# Patient Record
Sex: Female | Born: 1935 | Race: White | Hispanic: No | Smoking: Never smoker
Health system: Southern US, Community
[De-identification: ages and names within clinical notes are randomized; demographics above are authoritative.]

## PROBLEM LIST (undated history)

## (undated) DIAGNOSIS — IMO0001 Reserved for inherently not codable concepts without codable children: Secondary | ICD-10-CM

## (undated) DIAGNOSIS — I2699 Other pulmonary embolism without acute cor pulmonale: Secondary | ICD-10-CM

## (undated) DIAGNOSIS — R1310 Dysphagia, unspecified: Secondary | ICD-10-CM

## (undated) DIAGNOSIS — I2510 Atherosclerotic heart disease of native coronary artery without angina pectoris: Secondary | ICD-10-CM

## (undated) DIAGNOSIS — R413 Other amnesia: Secondary | ICD-10-CM

## (undated) DIAGNOSIS — M545 Low back pain, unspecified: Secondary | ICD-10-CM

## (undated) DIAGNOSIS — Z90710 Acquired absence of both cervix and uterus: Secondary | ICD-10-CM

## (undated) DIAGNOSIS — F0390 Unspecified dementia without behavioral disturbance: Secondary | ICD-10-CM

## (undated) DIAGNOSIS — G8929 Other chronic pain: Secondary | ICD-10-CM

## (undated) DIAGNOSIS — M25561 Pain in right knee: Principal | ICD-10-CM

## (undated) DIAGNOSIS — I428 Other cardiomyopathies: Secondary | ICD-10-CM

## (undated) DIAGNOSIS — I10 Essential (primary) hypertension: Secondary | ICD-10-CM

## (undated) DIAGNOSIS — M4800 Spinal stenosis, site unspecified: Secondary | ICD-10-CM

## (undated) DIAGNOSIS — L98499 Non-pressure chronic ulcer of skin of other sites with unspecified severity: Secondary | ICD-10-CM

## (undated) DIAGNOSIS — K5792 Diverticulitis of intestine, part unspecified, without perforation or abscess without bleeding: Secondary | ICD-10-CM

## (undated) DIAGNOSIS — S9031XA Contusion of right foot, initial encounter: Secondary | ICD-10-CM

## (undated) DIAGNOSIS — E785 Hyperlipidemia, unspecified: Secondary | ICD-10-CM

## (undated) DIAGNOSIS — R634 Abnormal weight loss: Secondary | ICD-10-CM

## (undated) HISTORY — DX: Acquired absence of both cervix and uterus: Z90.710

## (undated) HISTORY — DX: Pain in right knee: M25.561

## (undated) HISTORY — DX: Diverticulitis of intestine, part unspecified, without perforation or abscess without bleeding: K57.92

## (undated) HISTORY — DX: Low back pain, unspecified: M54.50

## (undated) HISTORY — DX: Non-pressure chronic ulcer of skin of other sites with unspecified severity: L98.499

## (undated) HISTORY — PX: CATARACT EXTRACTION: SUR2

## (undated) HISTORY — DX: Other chronic pain: G89.29

## (undated) HISTORY — PX: HEMICOLECTOMY: SHX854

## (undated) HISTORY — DX: Abnormal weight loss: R63.4

## (undated) HISTORY — DX: Essential (primary) hypertension: I10

## (undated) HISTORY — DX: Unspecified dementia without behavioral disturbance: F03.90

## (undated) HISTORY — DX: Dysphagia, unspecified: R13.10

## (undated) HISTORY — DX: Reserved for inherently not codable concepts without codable children: IMO0001

## (undated) HISTORY — DX: Atherosclerotic heart disease of native coronary artery without angina pectoris: I25.10

## (undated) HISTORY — DX: Low back pain: M54.5

## (undated) HISTORY — DX: Other pulmonary embolism without acute cor pulmonale: I26.99

## (undated) HISTORY — DX: Other amnesia: R41.3

## (undated) HISTORY — DX: Hyperlipidemia, unspecified: E78.5

## (undated) HISTORY — DX: Contusion of right foot, initial encounter: S90.31XA

## (undated) HISTORY — PX: BACK SURGERY: SHX140

## (undated) HISTORY — DX: Spinal stenosis, site unspecified: M48.00

## (undated) HISTORY — DX: Other cardiomyopathies: I42.8

---

## 1983-03-20 HISTORY — PX: OTHER SURGICAL HISTORY: SHX169

## 1989-03-19 HISTORY — PX: OTHER SURGICAL HISTORY: SHX169

## 1997-03-19 HISTORY — PX: OTHER SURGICAL HISTORY: SHX169

## 1997-12-15 ENCOUNTER — Encounter: Admission: RE | Admit: 1997-12-15 | Discharge: 1998-03-09 | Payer: Self-pay | Admitting: Anesthesiology

## 1998-07-20 ENCOUNTER — Ambulatory Visit (HOSPITAL_COMMUNITY): Admission: RE | Admit: 1998-07-20 | Discharge: 1998-07-20 | Payer: Self-pay | Admitting: Gastroenterology

## 1998-07-20 ENCOUNTER — Encounter: Payer: Self-pay | Admitting: Gastroenterology

## 1999-04-06 ENCOUNTER — Encounter: Payer: Self-pay | Admitting: Geriatric Medicine

## 1999-04-06 ENCOUNTER — Encounter: Admission: RE | Admit: 1999-04-06 | Discharge: 1999-04-06 | Payer: Self-pay | Admitting: Geriatric Medicine

## 1999-05-25 ENCOUNTER — Encounter: Payer: Self-pay | Admitting: Orthopedic Surgery

## 1999-06-01 ENCOUNTER — Inpatient Hospital Stay (HOSPITAL_COMMUNITY): Admission: RE | Admit: 1999-06-01 | Discharge: 1999-06-09 | Payer: Self-pay | Admitting: Orthopedic Surgery

## 1999-06-01 ENCOUNTER — Encounter: Payer: Self-pay | Admitting: Orthopedic Surgery

## 1999-06-02 ENCOUNTER — Encounter: Payer: Self-pay | Admitting: Orthopedic Surgery

## 1999-06-05 ENCOUNTER — Encounter: Payer: Self-pay | Admitting: Internal Medicine

## 1999-06-07 ENCOUNTER — Encounter: Payer: Self-pay | Admitting: Internal Medicine

## 1999-06-08 ENCOUNTER — Encounter: Payer: Self-pay | Admitting: Internal Medicine

## 1999-06-09 ENCOUNTER — Encounter: Payer: Self-pay | Admitting: Orthopedic Surgery

## 1999-06-21 ENCOUNTER — Ambulatory Visit (HOSPITAL_COMMUNITY): Admission: RE | Admit: 1999-06-21 | Discharge: 1999-06-21 | Payer: Self-pay | Admitting: Geriatric Medicine

## 1999-10-30 ENCOUNTER — Encounter: Payer: Self-pay | Admitting: Geriatric Medicine

## 1999-10-30 ENCOUNTER — Encounter: Admission: RE | Admit: 1999-10-30 | Discharge: 1999-10-30 | Payer: Self-pay | Admitting: Geriatric Medicine

## 2000-11-22 ENCOUNTER — Encounter: Payer: Self-pay | Admitting: Orthopedic Surgery

## 2000-11-22 ENCOUNTER — Encounter: Admission: RE | Admit: 2000-11-22 | Discharge: 2000-11-22 | Payer: Self-pay | Admitting: Orthopedic Surgery

## 2000-12-03 ENCOUNTER — Encounter: Payer: Self-pay | Admitting: Anesthesiology

## 2000-12-04 ENCOUNTER — Observation Stay (HOSPITAL_COMMUNITY): Admission: RE | Admit: 2000-12-04 | Discharge: 2000-12-05 | Payer: Self-pay | Admitting: Orthopedic Surgery

## 2003-01-17 ENCOUNTER — Inpatient Hospital Stay (HOSPITAL_COMMUNITY): Admission: EM | Admit: 2003-01-17 | Discharge: 2003-01-20 | Payer: Self-pay | Admitting: *Deleted

## 2003-01-18 HISTORY — PX: CORONARY ANGIOPLASTY WITH STENT PLACEMENT: SHX49

## 2003-03-20 HISTORY — PX: COLON RESECTION: SHX5231

## 2003-04-05 ENCOUNTER — Ambulatory Visit (HOSPITAL_COMMUNITY): Admission: RE | Admit: 2003-04-05 | Discharge: 2003-04-05 | Payer: Self-pay | Admitting: Orthopedic Surgery

## 2003-04-12 ENCOUNTER — Encounter: Admission: RE | Admit: 2003-04-12 | Discharge: 2003-04-12 | Payer: Self-pay | Admitting: Orthopedic Surgery

## 2003-04-26 ENCOUNTER — Encounter: Admission: RE | Admit: 2003-04-26 | Discharge: 2003-04-26 | Payer: Self-pay | Admitting: Neurosurgery

## 2003-05-10 ENCOUNTER — Encounter: Admission: RE | Admit: 2003-05-10 | Discharge: 2003-05-10 | Payer: Self-pay | Admitting: Orthopedic Surgery

## 2003-06-22 ENCOUNTER — Encounter: Admission: RE | Admit: 2003-06-22 | Discharge: 2003-06-22 | Payer: Self-pay | Admitting: Neurosurgery

## 2003-07-16 ENCOUNTER — Inpatient Hospital Stay (HOSPITAL_COMMUNITY): Admission: RE | Admit: 2003-07-16 | Discharge: 2003-07-18 | Payer: Self-pay | Admitting: Neurosurgery

## 2003-08-06 ENCOUNTER — Encounter: Admission: RE | Admit: 2003-08-06 | Discharge: 2003-08-06 | Payer: Self-pay | Admitting: Neurosurgery

## 2003-09-15 ENCOUNTER — Encounter: Admission: RE | Admit: 2003-09-15 | Discharge: 2003-09-15 | Payer: Self-pay | Admitting: Neurosurgery

## 2003-09-27 ENCOUNTER — Encounter: Admission: RE | Admit: 2003-09-27 | Discharge: 2003-09-27 | Payer: Self-pay | Admitting: Neurosurgery

## 2003-10-20 ENCOUNTER — Encounter: Admission: RE | Admit: 2003-10-20 | Discharge: 2003-10-20 | Payer: Self-pay | Admitting: Neurosurgery

## 2003-11-04 ENCOUNTER — Encounter: Admission: RE | Admit: 2003-11-04 | Discharge: 2003-11-04 | Payer: Self-pay | Admitting: Neurosurgery

## 2003-11-15 ENCOUNTER — Emergency Department (HOSPITAL_COMMUNITY): Admission: EM | Admit: 2003-11-15 | Discharge: 2003-11-16 | Payer: Self-pay | Admitting: Emergency Medicine

## 2003-11-16 ENCOUNTER — Inpatient Hospital Stay (HOSPITAL_COMMUNITY): Admission: EM | Admit: 2003-11-16 | Discharge: 2003-12-04 | Payer: Self-pay | Admitting: Emergency Medicine

## 2004-01-04 ENCOUNTER — Encounter: Admission: RE | Admit: 2004-01-04 | Discharge: 2004-01-04 | Payer: Self-pay | Admitting: General Surgery

## 2004-01-10 ENCOUNTER — Encounter: Admission: RE | Admit: 2004-01-10 | Discharge: 2004-01-19 | Payer: Self-pay | Admitting: General Surgery

## 2004-01-14 ENCOUNTER — Encounter (INDEPENDENT_AMBULATORY_CARE_PROVIDER_SITE_OTHER): Payer: Self-pay | Admitting: *Deleted

## 2004-01-14 ENCOUNTER — Inpatient Hospital Stay (HOSPITAL_COMMUNITY): Admission: RE | Admit: 2004-01-14 | Discharge: 2004-01-19 | Payer: Self-pay | Admitting: General Surgery

## 2004-03-07 ENCOUNTER — Inpatient Hospital Stay (HOSPITAL_COMMUNITY): Admission: RE | Admit: 2004-03-07 | Discharge: 2004-03-10 | Payer: Self-pay | Admitting: Orthopedic Surgery

## 2004-03-08 ENCOUNTER — Ambulatory Visit: Payer: Self-pay | Admitting: Physical Medicine & Rehabilitation

## 2004-03-10 ENCOUNTER — Inpatient Hospital Stay
Admission: RE | Admit: 2004-03-10 | Discharge: 2004-03-18 | Payer: Self-pay | Admitting: Physical Medicine & Rehabilitation

## 2004-06-16 ENCOUNTER — Encounter: Admission: RE | Admit: 2004-06-16 | Discharge: 2004-06-16 | Payer: Self-pay | Admitting: Neurosurgery

## 2005-04-23 ENCOUNTER — Encounter: Admission: RE | Admit: 2005-04-23 | Discharge: 2005-04-23 | Payer: Self-pay | Admitting: Geriatric Medicine

## 2005-04-24 ENCOUNTER — Inpatient Hospital Stay (HOSPITAL_COMMUNITY): Admission: EM | Admit: 2005-04-24 | Discharge: 2005-04-27 | Payer: Self-pay | Admitting: Emergency Medicine

## 2005-08-18 ENCOUNTER — Emergency Department (HOSPITAL_COMMUNITY): Admission: EM | Admit: 2005-08-18 | Discharge: 2005-08-18 | Payer: Self-pay | Admitting: Emergency Medicine

## 2005-12-17 ENCOUNTER — Encounter: Admission: RE | Admit: 2005-12-17 | Discharge: 2005-12-17 | Payer: Self-pay | Admitting: Cardiology

## 2005-12-21 ENCOUNTER — Observation Stay (HOSPITAL_COMMUNITY): Admission: AD | Admit: 2005-12-21 | Discharge: 2005-12-22 | Payer: Self-pay | Admitting: Cardiology

## 2006-01-17 ENCOUNTER — Encounter (HOSPITAL_COMMUNITY): Admission: RE | Admit: 2006-01-17 | Discharge: 2006-04-17 | Payer: Self-pay | Admitting: Cardiology

## 2006-04-19 ENCOUNTER — Encounter (HOSPITAL_COMMUNITY): Admission: RE | Admit: 2006-04-19 | Discharge: 2006-07-03 | Payer: Self-pay | Admitting: Cardiology

## 2008-03-19 HISTORY — PX: APPENDECTOMY: SHX54

## 2008-04-15 ENCOUNTER — Observation Stay (HOSPITAL_COMMUNITY): Admission: EM | Admit: 2008-04-15 | Discharge: 2008-04-16 | Payer: Self-pay | Admitting: Emergency Medicine

## 2008-04-15 ENCOUNTER — Ambulatory Visit: Payer: Self-pay | Admitting: Diagnostic Radiology

## 2008-04-15 ENCOUNTER — Encounter (INDEPENDENT_AMBULATORY_CARE_PROVIDER_SITE_OTHER): Payer: Self-pay | Admitting: General Surgery

## 2008-04-15 ENCOUNTER — Encounter: Payer: Self-pay | Admitting: Emergency Medicine

## 2009-07-15 ENCOUNTER — Encounter: Admission: RE | Admit: 2009-07-15 | Discharge: 2009-07-15 | Payer: Self-pay | Admitting: Cardiology

## 2009-08-07 ENCOUNTER — Emergency Department (HOSPITAL_COMMUNITY): Admission: EM | Admit: 2009-08-07 | Discharge: 2009-08-08 | Payer: Self-pay | Admitting: Emergency Medicine

## 2009-12-11 ENCOUNTER — Inpatient Hospital Stay (HOSPITAL_COMMUNITY): Admission: EM | Admit: 2009-12-11 | Discharge: 2009-12-13 | Payer: Self-pay | Admitting: Emergency Medicine

## 2010-04-08 ENCOUNTER — Encounter: Payer: Self-pay | Admitting: General Surgery

## 2010-06-01 LAB — COMPREHENSIVE METABOLIC PANEL
Albumin: 3.2 g/dL — ABNORMAL LOW (ref 3.5–5.2)
BUN: 13 mg/dL (ref 6–23)
CO2: 27 mEq/L (ref 19–32)
Calcium: 8.6 mg/dL (ref 8.4–10.5)
Creatinine, Ser: 1 mg/dL (ref 0.4–1.2)
GFR calc Af Amer: >60 mL/min (ref >60)
Total Protein: 6 g/dL (ref 6.0–8.3)

## 2010-06-01 LAB — POCT CARDIAC MARKERS
CKMB, poc: <1 ng/mL — ABNORMAL LOW (ref 1.0–8.0)
CKMB, poc: <1 ng/mL — ABNORMAL LOW (ref 1.0–8.0)
Myoglobin, poc: 78.2 ng/mL (ref 12–200)
Troponin i, poc: <0.05 ng/mL (ref 0.00–0.09)

## 2010-06-01 LAB — CARDIAC PANEL(CRET KIN+CKTOT+MB+TROPI)
CK, MB: 1.2 ng/mL (ref 0.3–4.0)
CK, MB: 1.2 ng/mL (ref 0.3–4.0)
Relative Index: INVALID (ref 0.0–2.5)
Relative Index: INVALID (ref 0.0–2.5)
Troponin I: 0.03 ng/mL (ref 0.00–0.06)
Troponin I: 0.03 ng/mL (ref 0.00–0.06)

## 2010-06-01 LAB — CBC
HCT: 37.4 % (ref 36.0–46.0)
Hemoglobin: 12.2 g/dL (ref 12.0–15.0)
Hemoglobin: 12.7 g/dL (ref 12.0–15.0)
MCHC: 32.6 g/dL (ref 30.0–36.0)
MCV: 89.9 fL (ref 78.0–100.0)
Platelets: 163 10*3/uL (ref 150–400)
Platelets: 185 10*3/uL (ref 150–400)
RBC: 4.35 MIL/uL (ref 3.87–5.11)
RDW: 14.4 % (ref 11.5–15.5)
WBC: 7.5 10*3/uL (ref 4.0–10.5)

## 2010-06-01 LAB — DIFFERENTIAL
Eosinophils Relative: 2 % (ref 0–5)
Lymphs Abs: 2.3 10*3/uL (ref 0.7–4.0)
Monocytes Absolute: 0.9 10*3/uL (ref 0.1–1.0)
Neutrophils Relative %: 55 % (ref 43–77)

## 2010-06-01 LAB — CK TOTAL AND CKMB (NOT AT ARMC)
Relative Index: INVALID (ref 0.0–2.5)
Total CK: 50 U/L (ref 7–177)

## 2010-06-01 LAB — POCT I-STAT, CHEM 8
BUN: 14 mg/dL (ref 6–23)
HCT: 40 % (ref 36.0–46.0)

## 2010-06-01 LAB — LIPID PANEL: VLDL: 17 mg/dL (ref 0–40)

## 2010-07-03 LAB — URINALYSIS, ROUTINE W REFLEX MICROSCOPIC
Bilirubin Urine: NEGATIVE
Glucose, UA: NEGATIVE mg/dL
Hgb urine dipstick: NEGATIVE
Nitrite: NEGATIVE
Protein, ur: NEGATIVE mg/dL
Specific Gravity, Urine: 1.013 (ref 1.005–1.030)

## 2010-07-03 LAB — COMPREHENSIVE METABOLIC PANEL
AST: 27 U/L (ref 0–37)
Albumin: 4.1 g/dL (ref 3.5–5.2)
Alkaline Phosphatase: 70 U/L (ref 39–117)
BUN: 19 mg/dL (ref 6–23)
CO2: 30 mEq/L (ref 19–32)
Calcium: 9.5 mg/dL (ref 8.4–10.5)
Chloride: 100 mEq/L (ref 96–112)
Creatinine, Ser: 1 mg/dL (ref 0.4–1.2)
Glucose, Bld: 120 mg/dL — ABNORMAL HIGH (ref 70–99)
Sodium: 138 mEq/L (ref 135–145)
Total Bilirubin: 0.6 mg/dL (ref 0.3–1.2)

## 2010-07-03 LAB — DIFFERENTIAL
Eosinophils Relative: 1 % (ref 0–5)
Lymphocytes Relative: 10 % — ABNORMAL LOW (ref 12–46)
Lymphs Abs: 1.4 10*3/uL (ref 0.7–4.0)
Monocytes Absolute: 0.9 10*3/uL (ref 0.1–1.0)
Neutro Abs: 11.7 10*3/uL — ABNORMAL HIGH (ref 1.7–7.7)
Neutrophils Relative %: 82 % — ABNORMAL HIGH (ref 43–77)

## 2010-07-03 LAB — URINE MICROSCOPIC-ADD ON

## 2010-07-03 LAB — URINE CULTURE

## 2010-07-03 LAB — CBC
HCT: 38.3 % (ref 36.0–46.0)
MCV: 88.4 fL (ref 78.0–100.0)
WBC: 14.2 10*3/uL — ABNORMAL HIGH (ref 4.0–10.5)

## 2010-08-01 NOTE — Discharge Summary (Signed)
[name], [name] NO.:  [id number]   MEDICAL RECORD NO.:  [id number]          PATIENT TYPE:  INP   LOCATION:  5531                         FACILITY:  [facility]   PHYSICIAN:  Alfonse Ras, MD   DATE OF BIRTH:  Aug 20, 1920   DATE OF ADMISSION:  04/15/2008  DATE OF DISCHARGE:  04/16/2008                               DISCHARGE SUMMARY   ADMITTING PHYSICIAN:  Lennie Muckle, M.D.   DISCHARGING PHYSICIAN:  Alfonse Ras, M.D.   CHIEF COMPLAINT/REASON FOR ADMISSION:  [name] is an 77 year old  female patient, history of CAD, cardiomyopathy and implanted cardiac  defibrillator who presented with abdominal pain that had begun about 6  p.m. on April 14, 2008.  She felt like she needed to have a BM but  after having a bowel movement had no relief in her pain.  She continued  to have worsening pain, no vomiting, no nausea, no diarrhea.  She  presented for treatment at the emergency department where she was found  to have a low-grade leukocytosis of 14,200.  CT of the abdomen and  pelvis revealed a thickened appendix consistent with probable early  appendicitis.  On exam, the patient was tender in the right lower  quadrant, had a midline incisional scar without hernia.  She had low-  grade fever of 99.2, otherwise vital signs were stable.  Based on  clinical exam and diagnostic workup, Dr. Freida Busman felt the patient had  acute appendicitis.  Therefore she was admitted with the following  diagnosis:  Acute appendicitis.   HOSPITAL COURSE:  The patient was taken directly from the ER to the OR  where she underwent a laparoscopic appendectomy for removal of a  nonperforated appendicitis.  Empirically, she has been given Unasyn  prior to surgery and was continued for several doses postoperatively.  On postop day one, April 16, 2008, the patient had a low-grade fever  99.2 in the past 24 hours.  Otherwise vital signs were stable.  No  respiratory trouble.  Abdomen was  soft, tender as expected around  incision, incision and skin were approximated with Dermabond and trocar  sites are unremarkable.  The patient tolerating full liquid diet, was  given IV morphine during the night, still needs to take p.o. Darvocet,  but otherwise is deemed appropriate for discharge home.  The patient was  previously at independent living at [facility], but because of recent  surgery she will go up to a slightly higher level of care prior to  discharge.   FINAL DISCHARGE DIAGNOSES:  1. Abdominal pain and leukocytosis secondary to acute nonperforated      appendicitis.  2. Status post laparoscopic appendectomy April 15, 2008 by Dr. Bertram Savin.  3. History of coronary artery disease, currently stable.  4. History of cardiomyopathy with implantable defibrillator.  5. History of pulmonary embolus.  6. History of ulcer disease.  7. History of dyslipidemia.   DISCHARGE MEDICATIONS:  The patient will resume the following home  medications.  1. Vitamin D 1000 mg daily.  2. Detrol LA  4 mg daily.  3. Coreg CR 20 mg daily.  4. Miacalcin 200 international units nasal spray daily.  5. Cymbalta 30 mg daily.  6. Vytorin 80 mg daily.  7. Lisinopril 5 mg daily.  8. Torsemide 10 mg daily.  9. Centrum Silver 1 pill daily.  10.Aspirin 325 mg daily.  11.Citracal 6000 mg 2 tablets daily.  12.Prevacid 15 mg daily.   NEW MEDICATIONS INCLUDE:  1. Darvocet-N 100 half to 1 tablet every 6 hours as needed for pain.  2. Over-the-counter stool softener of choice daily for prevention of      constipation.   DIET:  Low sodium heart-healthy in the immediate postoperative period  while taking Darvocet, encouraged slightly more p.o. fluid intake to  minimize risk for constipation.   WOUND CARE:  Not applicable.   ACTIVITY:  May shower.  No lifting greater than 20 pounds for 5 weeks.   FOLLOWUP:  She is to follow up with the __________OB Clinic at [facility] on  Tuesday, February 16 at 2 p.m.  She needs to arrive  at 1:45 p.m. to complete any necessary paperwork.      Allison L. Rolene Course      Alfonse Ras, MD  Electronically Signed    ALE/MEDQ  D:  04/16/2008  T:  04/16/2008  Job:  [id number]   cc:   Lennie Muckle, MD

## 2010-08-01 NOTE — Op Note (Signed)
[name], [name]                ACCOUNT NO.:  [id number]   MEDICAL RECORD NO.:  [id number]          PATIENT TYPE:  INP   LOCATION:  5531                         FACILITY:  [facility]   PHYSICIAN:  Lennie Muckle, MD      DATE OF BIRTH:  07-10-1920   DATE OF PROCEDURE:  04/15/2008  DATE OF DISCHARGE:                               OPERATIVE REPORT   PREOPERATIVE DIAGNOSIS:  Acute appendicitis.   POSTOPERATIVE DIAGNOSIS:  Acute appendicitis.   PROCEDURE:  Laparoscopic appendectomy.   SURGEON:  Lennie Muckle, MD   ASSISTANT:  None.   FINDINGS:  Inflamed thickened appendix, no purulent fluids.   SPECIMENS:  Appendix to Pathology.   ESTIMATED BLOOD LOSS:  Minimal amount of blood loss.   COMPLICATIONS:  No immediate complications.   DRAINS:  No drains are placed.   INDICATIONS FOR PROCEDURE:  Ms. Punches is an 77 year old female who had  abdominal pain starting yesterday.  She was seen in the emergency  department had an elevated white count and thickened appendix on CT  scan.  Examination was consistent with acute appendicitis.  Informed  consent was obtained.   DETAILS OF PROCEDURE:  Ms. Windt was identified in the preoperative  holding area.  All questions were answered.  She was taken to the  operating room.  Once in the operating room, placed in the supine  position.  Her abdomen was prepped and draped in usual sterile fashion.  Time-out procedure indicating the patient and the procedure was  performed.  I placed an incision in the right upper quadrant using the  #11 blade and placed a 5-mm trocar into the abdominal cavity, while  using the Optiview.  All layers of abdominal wall were visualized upon  entry.  I inspected the abdomen after obtaining adequate  pneumoinsufflation.  Colon was visualized in the right side of the  abdomen.  There was no evidence of injury upon placement of the trocar.  I then inspected the abdominal cavity.  There were adhesions of omentum  to  the midline.  Using the camera, I dissected some of these adhesions,  I was able to place a 5-mm trocar just to the right of the adhesions.  Using the trocar as well as a blunt dissector, I removed some of the  adhesions surrounding the trocar.  There seemed to be no other  adhesions.  I then placed a 5-mm trocar in the left lower quadrant under  visualization with a camera.  The patient was then placed in  Trendelenburg right side up.  I identified the cecum.  The appendix was  coursing near the pelvis.  This was inflamed and thickened.  I grasped  the appendix with a Glassman.  I then dissected some of the lateral  attachments with the Marilyn forceps.  I was able to pull the appendix  up and dissect around the base of the [facility] forceps.  I then up sized  the left lower quadrant port to 12-mm port.  Using laparoscopic GIA  stapler, I stapled across the base to the appendix with the blue  load.  Two loads were used.  I then stapled across the mesoappendix while  holding the appendix up to the abdominal wall.  Appendix was then placed  in the EndoCatch bag and removed from the abdomen.  I inspected the  staple and appeared intact.  There was no evidence of bleeding, appeared  to be no purulent fluid within the pelvis.  I did irrigate with 1 L of  saline.  Final inspection of staple line revealed no bleeding.  Staple  line was intact.  I then closed the facial defect at the left lower  quadrant after removing the 12-mm port.  A 0-Vicryl suture was placed  for closure.  The abdomen was inspected one more time.  There was no  evidence of bleeding or injury.  Trocars were removed.  Pneumoperitoneum  was released.  Skin was closed with 4-0 Monocryl.  Dermabond was placed  for final dressing.  The patient was extubated and then transported to  post anesthesia care unit in stable condition.  She will be monitored in  telemetry bed tonight, receive 1 dose of Unasyn postoperatively.  I will  start  her on Darvocet for pain.  She may be able to be discharged home  tomorrow faring her examination and comorbid condition such as heart and  lungs.      Lennie Muckle, MD  Electronically Signed     ALA/MEDQ  D:  04/15/2008  T:  04/16/2008  Job:  [id number]   cc:   Cherylynn Ridges, M.D.  Hal T. Stoneking, M.D.

## 2010-08-01 NOTE — H&P (Signed)
NAMENAVIKA, HOOPES                ACCOUNT NO.:  [id number]   MEDICAL RECORD NO.:  [id number]          PATIENT TYPE:  INP   LOCATION:  5531                         FACILITY:  [facility]   PHYSICIAN:  Lennie Muckle, MD      DATE OF BIRTH:  1920-07-07   DATE OF ADMISSION:  04/15/2008  DATE OF DISCHARGE:                              HISTORY & PHYSICAL   Appendicitis.   HISTORY OF PRESENT ILLNESS:  Ms. Tocci is an 77 year old female who  began having abdominal pain approximately 6 o'clock in the evening  around April 14, 2008.  She thought this was related to having had a  bowel movement.  She had tried several factors to relieve her pain.  She  did have 2 bowel movements, but continued to have abdominal pain.  Most  of it was located in the right lower quadrant.  This was worse with  movement.  No nausea, vomiting, and no diarrhea.  She went to the  emergency department and the CT scan revealed a thickened appendix.  She  did have a white count elevated at 14.2.   PAST MEDICAL HISTORY:  1. Coronary artery disease.  2. History of PE.  3. Ulcer disease.  4. She has an AICD.  5. History of cardiomyopathy.   SURGICAL HISTORY:  Colon resection in 2005, history of diverticulitis,  AICD placement, she had kidney replacement, two shoulder surgeries and  back surgery.   MEDICATIONS:  Aspirin, Vytorin, Detrol, lisinopril, Prevacid, Coreg, and  Cymbalta.   ALLERGIES:  IBUPROFEN causes nausea.   REVIEW OF SYSTEMS:  She is essentially negative.  __________.   PHYSICAL EXAMINATION:  GENERAL:  She is lying on a stretcher.  She is  pleasant, is in no acute distress.  VITAL SIGNS:  Temperature is 99.2, blood pressure 130/70, pulse 80, and  respiratory rate 20.  HEENT:  Extraocular muscles are intact.  Head is normocephalic.  CHEST:  Clear to auscultation bilaterally.  CARDIOVASCULAR:  Regular rate and rhythm.  ABDOMEN:  Midline incisional scar.  No hernias are appreciable.  She  does have  some tenderness in the right lower quadrant.  No peritoneal  signs.  EXTREMITIES:  No deformities or edema are seen.  SKIN:  She has very thin skin, ecchymosis on the left arm and bilateral  lower extremity.   LABORATORY DATA:  CT scan, thickened appendix, no fluid is noted.  Other  labs are unremarkable.   ASSESSMENT AND PLAN:  Acute appendicitis.  I have discussed with Ms.  Bergstresser and her family that she should benefit from an appendectomy.  I  will attempt laparoscopic appendectomy.  She has had previous colon  surgery at the midline incision.  If she has too much scar tissue may  have to convert to an open procedure.  I did talk  about possible postoperative abscess, bleeding, and infection.  She  understands the risks.  We will go ahead and proceed to the operating  room.  She was dosed with Unasyn preoperatively in the emergency  department, and we will dose postoperatively.  All questions were  answered and informed consent was obtained.      Lennie Muckle, MD  Electronically Signed     ALA/MEDQ  D:  04/15/2008  T:  04/15/2008  Job:  [id number]

## 2010-08-04 NOTE — Discharge Summary (Signed)
NAME:  [name], [name]                          ACCOUNT NO.:  [id number]   MEDICAL RECORD NO.:  [id number]                   PATIENT TYPE:  INP   LOCATION:  6525                                 FACILITY:  [facility]   PHYSICIAN:  Corinna L. Lendell Caprice, MD             DATE OF BIRTH:  [birth year]   DATE OF ADMISSION:  01/17/2003  DATE OF DISCHARGE:  01/20/2003                                 DISCHARGE SUMMARY   PRIMARY CARE PHYSICIAN:  Dr. Merlene Laughter.   CARDIOLOGIST:  Dr. [name].   DISCHARGE DIAGNOSES:  1. Atypical chest pain secondary to coronary artery disease, resolved.  2. Coronary artery disease, status post stent placement, stable.  3. Dyslipidemia, stable.  4. Hypertension, stable.  5. Cardiomyopathy, stable on medications.  6. Gastroesophageal reflux disease, stable.  7. Pseudogout, stable.  8. Osteoarthritis, stable.   DISCHARGE MEDICATIONS:  1. Prednisone 10 mg twice a day.  2. Prevacid 30 mg a day.  3. Aspirin 325 mg a day.  4. Plavix 75 mg a day.  5. Toprol XL 50 mg a day.  6. Lipitor 20 mg a day.   ALLERGIES:  No known drug allergies.   PROCEDURES:  The patient underwent cardiac catheterization on January 19, 2003, by Dr. [name].  She was found to have 95% blockage of the  second obtuse marginal circumflex which was stented.  The patient had  borderline disease with marked calcification in the proximal LAD with  borderline critical disease in the mid LAD, dominant large OM1, and the RCA  was found to be non-dominant.  The patient tolerated the procedure well.  There were no complications.   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  She has been tapered on prednisone  down to 5 mg just starting yesterday.  This morning at 4:30 she was  awakened  with a pressure type pain in her chest, pleuritic in nature.  The patient's  pain was relieved slightly in the ER with morphine, but never totally gone  away.  She did develop some nausea after morphine.  She has had no fever and  no cough.  She had a sense of shortness of breath, but primarily because she  has had trouble taking a deep breath due to discomfort in the left side of  her chest.  She has no leg swelling or tenderness.  She is admitted to rule  out MI.   HOSPITAL COURSE:  The patient is admitted to telemetry unit.  A 12-lead EKG  reveals sinus tachycardia with questionable T-wave changes in the lateral  leads.  Her cardiac rhythm remains stable on telemetry.  Cardiac enzymes  were done in a series and found  to be negative.  The patient underwent a  stress Cardiolite which revealed no indication of ischemia;  however, the  stress test did indicate significant LV dysfunction.  The patient therefore  underwent cardiac catheterization as noted above on January 19, 2003.  She  was found to have critical disease in her OM2 which was stented.  She  tolerated the procedure without difficulty, and has not had any further  chest pain or pressure or shortness of breath.  The patient also underwent a  2-D echocardiogram, the results of which are pending at the time of  discharge, but Dr. Fraser Din will be following those up.   The patient has a history of hypertension.  Her medication regimen at time  of admission was slightly altered by the cardiologist to better suit her  cardiomyopathy, including beta blockers and ACE inhibitors.  She will be  following up with Dr. Fraser Din regarding her CAD and hypertension.   The patient did not have any difficulty from her pseudogout during this  hospitalization nor from her GERD.  She was kept on stress ulcer  prophylaxis.  Of note, the patient's prednisone was increased to 10 mg twice  a day during this hospitalization.  She is to  continue this dosage until she  follows up with Dr. Pete Glatter.  Her current appointment with Dr. Pete Glatter  is January 20, 2003, at 3:30 p.m.   At time of discharge, the patient is free of any signs or symptoms of stress  with no chest pain, shortness of breath, palpitations, nausea.  Blood  pressure is 110/60, heart rate is in the 80s.   DISCHARGE LABORATORY DATA:  White blood cell count 8.5, hemoglobin 11.2,  hematocrit 32.9, platelet count 306,000.  Sodium 141, potassium 3.9, glucose  123, BUN 17, creatinine 1.1, total bilirubin 0.5, ALP 61, AST 20, ALT 13.   CONSULTATIONS:  Dr. [name] of [facility].   CONDITION ON DISCHARGE:  Good.   DISPOSITION:  Discharged to home.    FOLLOWUP:  The patient is scheduled with followup with Dr. Pete Glatter on  Wednesday, January 20, 2003, at 3:30 p.m., and with Dr. Fraser Din on February 05, 2003, at 9:45 a.m.      Ellender Hose. Earlene Plater, N.P.                    Corinna L. Lendell Caprice, MD    SMD/MEDQ  D:  01/20/2003  T:  01/21/2003  Job:  [id number]   cc:   [name], M.D.  [address]  [place]  [place] [address]  Fax: [phone]   Hal T. Stoneking, M.D.  [address] [place], [place] [address]  Fax: [phone]

## 2010-08-04 NOTE — Discharge Summary (Signed)
[name], [name]                ACCOUNT NO.:  [id number]   MEDICAL RECORD NO.:  [id number]          PATIENT TYPE:  INP   LOCATION:  5028                         FACILITY:  [facility]   PHYSICIAN:  Rodney A. Mortenson, M.D.DATE OF BIRTH:  17-Jul-1920   DATE OF ADMISSION:  03/07/2004  DATE OF DISCHARGE:  03/10/2004                                 DISCHARGE SUMMARY   ADMISSION DIAGNOSES:  1.  Painful left total knee arthroplasty.  2.  Hypertension.  3.  Gastroesophageal reflux disease.  4.  Hiatal hernia.  5.  Coronary heart disease, with stent.  6.  History of pulmonary emboli.  7.  Diverticulosis.  8.  Osteoporosis.  9.  Sleep apnea.  10. Pseudogout.  11. Degenerative disc disease and spinal stenosis of the lumbar spine.  12. History of hepatitis A in 1980.   DISCHARGE DIAGNOSES:  1.  Revision left total knee arthroplasty with poly exchange.  2.  Postoperative blood loss anemia.  3.  History of hypertension.  4.  History of gastroesophageal reflux disease.  5.  History of hiatal hernia.  6.  History of coronary artery disease, with stent.  7.  History of pulmonary emboli.  8.  History of diverticulosis.  9.  History of osteoporosis.  10. History of sleep apnea.  11. History of pseudogout.  12. History of degenerative disc disease, with spinal stenosis.  13. History of hepatitis A.   HISTORY OF PRESENT ILLNESS:  The patient is an 77 year old female with a  history of right total knee arthroplasty in 1985 with a right total knee  arthroplasty in 1987 and a left total knee arthroplasty in 1998.  The  patient presents with a one-year history of progressively worsening left  knee.  It is a constant sharp diffuse pain about the knee, with radiation up  into the hip.  It increases with walking.  It improves with heat and  Vicodin.  She does have stiffness, edema, and using a walker for the last  three months.   ALLERGIES:  Motrin.   CURRENT MEDICATIONS:  1.  Vytorin 10/40 mg  p.o. daily.  2.  Coumadin 2 mg p.o. every other day alternating with 3 mg p.o. every      other day.  3.  Toprol XL 100 mg p.o. daily.  4.  Vicodin ES p.r.n.  5.  Prevacid 50 mg p.o. daily.  6.  Colace 100 mg p.o. daily p.r.n.  7.  Miacalcin spray daily.   SURGICAL PROCEDURE:  On March 07, 2004, the patient was taken to the O.R.  by Rinaldo Ratel, M.D., assisted by Norlene Campbell, M.D.  The patient  underwent a revision of her left total knee arthroplasty with synovectomy  and tibial bearing insert exchange.  The patient tolerated the procedure  well.  There were no complications.  One Hemovac drain was left in place.  The patient was discharged to the recovery room and then to the orthopedics  floor in good condition, returning to her Coumadin for DVT prophylaxis, IV  antibiotics, and IV pain medicines.   CONSULTS:  The following  routine consults were requested:  Physical therapy,  occupational therapy, and rehab.   HOSPITAL COURSE:  On March 07, 2004, the patient was admitted to [facility] under the care of Dr. Rinaldo Ratel.  The patient was taken  to the O.R., where revision of her left total knee arthroplasty was  performed without any complications.  The patient tolerated the procedure  well and was transferred to the recovery room and then to the orthopedic  floor in good condition on IV pain medications, antibiotics, and restarted  Coumadin for DVT prophylaxis.   The patient then incurred a total of three days postoperative care on the  orthopedic floor in which the patient did develop some postoperative blood  loss anemia for which the patient felt symptomatic.  Hemoglobin 9.4, so she  was typed and crossed and transfused 2 units of packed red blood cells  without any complications, with expected improvement of her hemoglobin to  11.6 and improvement in her blood pressure and heart rate.  The patient felt  less lethargic after getting the  transfusion.  The patient's vital signs  otherwise remained stable, except for occasional low-grade temperatures  around the time of her transfusion.  Her wound remained benign for any signs  of infection.  Her leg remained neuromotor vascularly intact.  The patient  worked well with physical therapy, but it was felt that due to her living  alone at home and other social issues she would benefit from rehab, so she  was evaluated and transferred to that unit on postop day three in good  condition.   LABORATORIES:  Chest x-ray on admission showed no acute abnormalities, upper  limit heart size, and a large hiatal hernia.  CBC on March 10, 2004:  WBCs 8.1, down from a high of 12.3.  Hemoglobin 11.7, up from 9.4 postop day  two.  Hematocrit 35.4, platelets 189.  INR on March 10, 2004 was 1.4 on  Coumadin.  Routine chemistries on March 09, 2004:  Sodium 136, potassium  3.6, glucose 104, BUN 10, creatinine 0.8.  Iron studies on March 08, 2004:  Iron 46, TIBC 256, percent saturation 18.  Urinalysis on admission  was positive for moderate leukocyte esterase, 3-6 WBCs, no bacteria noted.  The patient was treated with routine postoperative antibiotics.   The patient received a total of 2 units of packed red blood cells during her  hospitalization without complications.   MEDICATIONS ON DISCHARGE FROM ORTHOPEDICS FLOOR:  1.  Colace 100 mg p.o. b.i.d.  2.  Trinsicon 1 tablet p.o. t.i.d.  3.  Lovenox bridging at 30 mg subcutaneously q.12 hours until therapeutic.  4.  Robaxin 500 mg p.o. q.6 h. p.r.n.  5.  Tylenol 650 mg p.o. q.4 h. p.r.n.  6.  Percocet 1 or 2 tablets q.4-6 h. p.r.n.  7.  Toprol XL 100 mg p.o. daily.  8.  Prevacid 15 mg p.o. daily.  9.  Miacalcin spray 1 puff daily.  10. Zetia 10 mg p.o. daily.  11. Zocor 40 mg p.o. daily.  12. Coumadin 4 mg p.o. daily.   DISCHARGE INSTRUCTIONS:  To rehab unit.  MEDICATIONS:  The patient is to continue medications as dispensed  on  orthopedics floor.  Once the patient becomes therapeutic with INR, she may  discontinue Lovenox.   ACTIVITY:  The patient may be weightbearing as tolerated with the use of a  walker and physical therapy.   WOUND CARE:  The patient should have wound  checked daily for any signs of  infection.  Staples to be removed on postop day 14 if still in hospital.   DIET:  No restrictions.   FOLLOWUP:  The patient needs a follow-up appointment with Dr. Chaney Malling in  his office one week from discharge from rehab services.   CONDITION UPON DISCHARGE TO REHAB UNIT:  Improved and good.      RWK/MEDQ  D:  05/23/2004  T:  05/23/2004  Job:  [id number]

## 2010-08-04 NOTE — Op Note (Signed)
[name]  Patient:    Christine, Henry Visit Number: [id number] MRN: [id number]          Service Type: SUR Location: 4W [id number] [id number] Attending Physician:  Loanne Drilling Proc. Date: 12/04/00 Admit Date:  12/04/2000                             Operative Report  PREOPERATIVE DIAGNOSES:  Right shoulder impingement, acromioclavicular arthrosis, rotator cuff tear.  POSTOPERATIVE DIAGNOSES:  Right shoulder impingement, acromioclavicular arthrosis, rotator cuff tear.  PROCEDURE:  Right shoulder subacromial decompression, distal clavicle resection, and debridement of massive irreparable rotator cuff tear.  SURGEON:  Ollen Gross, M.D.  ASSISTANT:  Irena Cords, P.A.-C.  ANESTHESIA:  Intrascalene and general.  ESTIMATED BLOOD LOSS:  Minimal.  DRAINS:  None.  COMPLICATIONS:  None.  CONDITION:  Stable to recovery.  BRIEF CLINICAL NOTE:  [name] is a 77 year old female, who had an injury several months ago with progressively worsening right shoulder pain.  The initial exam was consistent with possible rotator cuff tear.  I initially saw her about two weeks ago.  We got an MRI scan confirming tear with retraction. She presents now for attempted repair and decompression/distal clavicle resection.  DESCRIPTION OF PROCEDURE:  After the successful administration of intrascalene block, then general anesthetic, the patient is placed sitting upright in the beach chair position, and her right upper extremity and shoulder girdle isolated from her trunk with plastic drapes and prepped and draped in the usual sterile fashion.  The shoulder landmarks are marked, and the incision is made along the skin lines from mid acromion anteriorly.  Skin was cut with a 10 blade, subcutaneous tissue to the deltoid fascia.  The subcu flaps are elevated circumferentially.  The distal clavicle is palpated and the periosteum was split longitudinally.  About 2 cm medial  to this, the clavicle and its line crosses all the way across the anterior aspect of the acromion, and the deltoid is split for about 2 cm from the lateral right tip of the acromion.  The entire anterior soft tissue flap is subperiosteally elevated. Posteriorly we just elevated up the distal clavicle.  Retractors are placed and the distal clavicle resection made with oscillating saw, removing about 1 cm of the bone.  The subacromial decompression is performed utilizing an oscillating saw to creatinine a flat undersurface of the acromion.  She had a mild type 2 curve, and this converted her to a flat undersurface.  The cup is now inspected, and there is a fair amount of fibrous bursal tissue which is excised.  I was only able to find the subscapularis which was partially torn, and the free edge of that is repaired back to the biceps which was completely intact.  The anchor for the subscapularis was fine.  There was no evidence whatsoever of remnants of supraspinatus inferior space of teres minor.  The tissue that was medial to the glenoid was debrided back to a more stable edge so as to prevent any catching in the joint.  I put her through a range of motion, and there was no popping or catching.  The wound was then copiously irrigated with antibiotic solution and then the deltoid reattached to the acromion through drill holes with #1 Ethibond.  The shoulder was again placed through a range of motion, and this repair held without problems.  The fascia over the distal clavicle was then embrocated with the  Ethibond and the deltoid split closed with interrupted 2-0 Vicryl.  Subcu is closed with interrupted 2-0 Vicryl, subcuticular with running 4-0 Monocryl.  Incisions were cleaned and dried and Steri-Strips and a bulky sterile dressing applied.  She was placed into a sling, awakened, and transported to recovery in stable condition. Attending Physician:  Loanne Drilling DD:  12/04/00 TD:   12/04/00 Job: [id number]

## 2010-08-04 NOTE — Cardiovascular Report (Signed)
[name], DANTE                          ACCOUNT NO.:  [id number]   MEDICAL RECORD NO.:  [id number]                   PATIENT TYPE:  INP   LOCATION:  5527                                 FACILITY:  [facility]   PHYSICIAN:  [name], M.D.                 DATE OF BIRTH:  Oct 31, 1920   DATE OF PROCEDURE:  01/19/2003  DATE OF DISCHARGE:                              CARDIAC CATHETERIZATION   INDICATION FOR PROCEDURE:  Cardiomyopathy, ejection fraction 35% with  ongoing fatigue.   [name] is a very pleasant 77 year old female who as admitted to the hospital  for atypical chest pain.  She subsequently underwent a stress Cardiolite.  There was no inducible ischemia by Cardiolite.  However, the patient was  noted to have diffuse hypokinesis with ejection fraction of 35%.  Based on  these findings, it was elected to bring the patient for cardiac  catheterization.   PROCEDURE:  After obtaining written informed consent, the patient was  brought to the cardiac catheterization lab in a postabsorptive state.  Preop  sedation was achieved using IV Versed.  The right groin was prepped and  draped in the usual sterile fashion.  Local anesthesia was achieved using 1%  Xylocaine.  A 6 French hemostasis sheath was placed into the right femoral  artery using modified Seldinger technique.  Selective coronary angiography  was performed using a JL-4, JR-4 Judkins catheter.  Multiple views were  obtained.  All catheter exchanges were made over guide wire.  The films were  reviewed with Dr. Verdis Prime.  It was felt that the obtuse marginal could  be intervened on.  However, he felt that the left anterior descending could  possibly be critical as well.  He suggested an additional opinion from Dr.  Corliss Marcus.  This currently is being obtained.  The patient was  transferred to the holding area and hemostasis was sewn into place until  further decisions could be made.   FINDINGS:  1. Aortic pressure  was 137/80.  2. LV pressure 137/14.  3. Single plane ventriculogram revealed normal wall motion.  Ejection     fraction 65%.  Fluoroscopy revealed marked calcification of the proximal     LAD.   CORONARY ANGIOGRAPHY:  1. The left main coronary artery bifurcates into the left anterior     descending and circumflex vessel.  There is no disease in the left main     coronary artery.  2. LAD:  Left anterior descending is a moderate size vessel that gives rise     to a moderate size D1, small D2 and goes on in at the apex.  There is     borderline disease in the mid LAD of up to 50-60%.  3. Circumflex vessel:  Circumflex vessel gives rise to a large bifurcating     OM-1, moderate OM-2 and goes on in as a PDA branch.  There  is an 80%     lesion in the second obtuse marginal.  4. Right coronary artery:  Right coronary artery is a nondominant artery.   FINAL IMPRESSION:  1. Preserved left ventricular function.  2. Borderline disease in the left anterior descending.  3.     Critical disease in the second obtuse marginal.  4. Nondominant right coronary artery.   The films will be reviewed by Dr. Amil Amen.  Further intervention pending his  review.                                               [name], M.D.    HP/MEDQ  D:  01/19/2003  T:  01/19/2003  Job:  [id number]   cc:   Hal T. Stoneking, M.D.  [address] [place], [place] [address]  Fax: [phone]

## 2010-08-04 NOTE — H&P (Signed)
NAME:  LADAVIA, LINDENBAUM                          ACCOUNT NO.:  [id number]   MEDICAL RECORD NO.:  [id number]                   PATIENT TYPE:  EMS   LOCATION:  MAJO                                 FACILITY:  [facility]   PHYSICIAN:  Hal T. Stoneking, M.D.              DATE OF BIRTH:  Dec 09, 1920   DATE OF ADMISSION:  01/17/2003  DATE OF DISCHARGE:                                HISTORY & PHYSICAL   IDENTIFYING INFORMATION:  Ms. Pagaduan is a very nice 77 year old white  female.  She has had a prior history of a hiatal hernia with GERD.  She also  has recently been diagnosed with calcium pyrophosphate disease and really  has had a very significant problem with swelling and tenderness in her  hands.  She has been tapered on prednisone down to 5 mg just starting  yesterday.  This morning at 4:30 she was awakened with a pressure-type pain  her chest.  It was pleuritic in nature.  The patient was relieved slightly  in the ER with morphine sulfate but has never totally gone away.  She did  develop some nausea after the morphine.  She has had no fever, no cough.  She has had a sense of shortness of breath but primarily because she has had  trouble taking a deep breath due to the discomfort in the left side of her  chest.  She has had no leg swelling or tenderness.   ALLERGIES:  No known drug allergies.   CURRENT MEDICATIONS:  1. Multivitamin once a day.  2. Prednisone 5 mg a day.  3. Prevacid 30 mg a day.  4. Norvasc 5 mg a day.  5. Miacalcin nasal spray one spray a day.  6. Tums four times a day.   PAST MEDICAL HISTORY:  Remarkable for a hiatal hernia with GERD, calcium  pyrophosphate disease, osteoarthritis, past history of hepatitis B, history  of sleep apnea, history of hypertension.   PAST SURGICAL HISTORY:  She has had a vaginal hysterectomy 1980, bilateral  total knee replacement.  She has had left shoulder and right shoulder  surgery, the last in September of 2002 by Ollen Gross,  M.D.   FAMILY HISTORY:  Remarkable for coronary artery disease in her father and  sister.   SOCIAL HISTORY:  She does not smoke.  She is widowed.  She has five  children.  She lives at [facility].   REVIEW OF SYSTEMS:  She has had no headache, no change in her vision or  hearing, no significant cough, no abdominal pains, bowels have been moving  normally, no blood in her stool.   PHYSICAL EXAMINATION:  VITAL SIGNS:  She is afebrile.  O2 saturation 99%.  Blood pressure 160/92, respiratory rate 24, pulse 100.  HEENT:  Pupils equal, round, reactive to light.  Extraocular muscles intact.  Fundi appear normal.  TMs normal.  Oropharynx moist.  NECK:  No JVD or thyromegaly.  LUNGS:  Clear.  HEART:  Regular rate and rhythm without murmurs, rubs, or gallops.  She is  exquisitely tender over the 2nd, 3rd, and 4th rib insertions to the sternum.  ABDOMEN:  Soft, no hepatosplenomegaly or masses palpated.  RECTAL AND PELVIC:  Deferred.  These have been done in the office.  EXTREMITIES:  No cyanosis or clubbing.   LABORATORY AND ACCESSORY DATA:  Sodium 138, potassium 3.3, chloride 103, BUN  14, glucose 88, pH 7.42.  Hemoglobin 14, white count 12,400 this was on the  prednisone.  CK 49 with an MB of 2.4, troponin 0.05.   EKG revealed some nonspecific ST-T wave changes.  Chest x-ray revealed  bilateral atelectasis and a large hiatal  hernia.   ASSESSMENT:  Persistent chest pain pleuritic in nature awoke her from sleep.  So far cardiac enzymes, EKG do no show acute injury.   PLAN:  Will admit, will obtain a spiral CT to rule out a pleural emboli,  also obtain serial CK-MBs, consider a Cardiolite study.  I suspect that much  of her pain may be due to costochondritis so we will increase her prednisone  to 10 mg b.i.d. as this may be a factor with the calcium pyrophosphate  disease.                                                Hal T. Pete Glatter, M.D.    HTS/MEDQ  D:  01/17/2003  T:   01/17/2003  Job:  [id number]

## 2010-08-04 NOTE — Discharge Summary (Signed)
NAMEBRYTNEY, [name]                ACCOUNT NO.:  [id number]   MEDICAL RECORD NO.:  [id number]          PATIENT TYPE:  INP   LOCATION:  NA                           FACILITY:  [facility]   PHYSICIAN:  Jimmye Norman III, M.D.  DATE OF BIRTH:  Jan 13, 1921   DATE OF ADMISSION:  01/14/2004  DATE OF DISCHARGE:  01/19/2004                                 DISCHARGE SUMMARY   DISCHARGE DIAGNOSIS:  Diverticulitis, chronic and acute.   ADDITIONAL DIAGNOSES:  1.  Severe arthritis, status post multiple joint replacements.  2.  Recent diagnosis of a pulmonary embolus on Coumadin therapy.   DISCHARGE MEDICATIONS:  1.  Include her preoperative medications which did include Coumadin and the      patient had been started on that  prior to discharge.  2.  Prevacid.  3.  Toprol.  4.  Aspirin.  5.  A nasal spray.   FOLLOW UP:  She was to follow up to see me on January 25, 2004.   DIET:  Unrestricted with the exception of a post diverticulitis diet.   CONDITION ON DISCHARGE:  Stable.  The patient was on the __________study.   HOSPITAL COURSE:  The patient was admitted the day of her surgery.  She had  received on outpatient bowel prep for her known diverticulitis and  peridiverticular inflammation and abscess.  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  The procedure went well.  Postoperatively she was started on diets immediately and has progressed  rapidly to where by postoperative day #3 she was advanced to a regular diet.  She was started on a Coumadin __________on day #4.  Postoperative day #5 she  was ready to go home and she was discharged to home.   FOLLOW UP:  She was to follow up to see me on January 25, 2004.   One additional diagnosis and problem noted was that the patient had a stage  I to stage II sacral decubitus ulcer which partially started prior to her  discharge.  This was debrided and  will be treated with DuoDerm as an  outpatient with plans to get a wound care consultation.  However, with the  patient being mostly ambulatory, this is not expected to advance as a  problem.    JW/MEDQ  D:  02/24/2004  T:  02/24/2004  Job:  [id number]

## 2010-08-04 NOTE — Discharge Summary (Signed)
[name], [name]                ACCOUNT NO.:  [id number]   MEDICAL RECORD NO.:  [id number]          PATIENT TYPE:  INP   LOCATION:  2018                         FACILITY:  [facility]   PHYSICIAN:  Corinna L. Lendell Caprice, MDDATE OF BIRTH:  April 30, 1920   DATE OF ADMISSION:  04/24/2005  DATE OF DISCHARGE:  04/27/2005                                 DISCHARGE SUMMARY   DISCHARGE DIAGNOSES:  1.  Acute bronchitis with bronchospasm.  2.  Non-ischemic cardiomyopathy with an ejection fraction of 25% on MRI      viability study.  3.  History of coronary artery stent placement in 2004 and 2005.  4.  History of pulmonary embolus in 2005.  5.  Osteoporosis.  6.  Gastroesophageal reflux disease.  7.  History of peptic ulcer disease.  8.  History of colon resection secondary to diverticulitis in 2005.   DISCHARGE MEDICATIONS:  1.  Avelox 400 mg p.o. daily for three more days.  2.  Tessalon Perles as needed.  3.  Guaifenesin as needed.  4.  Albuterol MDI two puffs four times a day for three days and then q.4h.      p.r.n. wheeze or shortness of breath.  5.  Her Toprol has been decreased to 50 mg a day.  6.  Vytorin 10/40 a day.  7.  Miacalcin nasal spray alternating nostrils daily.  8.  Prevacid daily.  9.  Detrol LA 4 mg daily.   DIET:  Low salt.   ACTIVITY:  She is to increase her activity slowly.   FOLLOW-UP:  Follow up with Dr. Pete Glatter next week.  Follow up with Dr.  Fraser Din at 12:45 p.m. on May 04, 2004.   CONDITION ON DISCHARGE:  Stable.   CONSULTS:  Dr. Fraser Din   PROCEDURES:  None.   PERTINENT LABORATORIES:  CBC was significant for a hemoglobin of 11.5,  hematocrit 33, otherwise unremarkable.  Basic metabolic panel significant  for a potassium of 3.4, cardiac enzymes negative.  BNP was less than 30.   SPECIAL STUDIES AND RADIOLOGY:  EKG showed sinus tachycardia.  Chest x-ray  done reportedly in the office showed no pneumonia.  CT of the chest showed  no pulmonary embolus.   This was done as an outpatient.  There may be a small  chronic PE versus contrast add mixture.  Large hiatal hernia with clear  lungs.  MRI cardiac function showed findings consistent with non-ischemic  cardiomyopathy with an ejection fraction of 24% and global hypokinesia,  mitral regurgitation, and aortic insufficiency, moderate left atrial and  left ventricular enlargement.   HISTORY AND HOSPITAL COURSE:  [name] is a pleasant 77 year old white  female patient of Dr. Pete Glatter who was directly admitted to telemetry.  Apparently she had been having some shortness of breath.  She also had some  fevers and tachycardia.  She had reportedly negative chest x-ray and an EKG  which showed sinus tachycardia.  Armanda Magic, M.D. performed an  echocardiogram in the office which showed an ejection fraction of 40% which  was diminished compared to a previous ejection fraction.  She  was admitted  to telemetry with acute bronchitis.  She had had some wheezing as well.  Her  oxygen saturation in the office was only 88% on room air.  Please see H&P  for complete details.  She had been coughing as well.  She was started on  Avelox.  Patient was admitted to the telemetry unit and started on Avelox,  Xopenex, oxygen, cough suppressants, and mucolytics.  Dr. Fraser Din consulted  and recommended MRI viability study.  Patient had no signs of congestive  heart failure.  The MRI was consistent with non-ischemic cardiomyopathy.  The patient's shortness of breath, oxygen saturations, weakness improved and  at the time of discharge she did have some coarse wheezing and cough, but  felt much better and was requesting to go home.  She had normal vital signs.  She did have some borderline low blood pressure at one point of systolic 85.  Therefore, her Toprol was cut in half and she may need an ACE inhibitor upon  follow-up with Dr. Fraser Din if her blood pressure can tolerate.  She was  given potassium for her  hypokalemia.      Corinna L. Lendell Caprice, MD  Electronically Signed     CLS/MEDQ  D:  04/27/2005  T:  04/27/2005  Job:  [id number]   cc:   Hal T. Stoneking, M.D.  Fax: [phone]   [name], M.D.  Fax: [phone]

## 2010-08-04 NOTE — H&P (Signed)
NAME:  [name], [name]                          ACCOUNT NO.:  [id number]   MEDICAL RECORD NO.:  [id number]                   PATIENT TYPE:  INP   LOCATION:  4740                                 FACILITY:  [facility]   PHYSICIAN:  Hal T. Stoneking, M.D.              DATE OF BIRTH:  11-22-1920   DATE OF ADMISSION:  11/16/2003  DATE OF DISCHARGE:                                HISTORY & PHYSICAL   IDENTIFYING DATA:  [name] is a delightful 77 year old white female.  She has a past history of diverticulosis.  She has not been feeling well for  approximately three days, just stated that she felt run-down and decreased  appetite.  Then yesterday afternoon she developed a migratory, sharp,  abdominal pains, that seemed to go away after a while, then yesterday  evening they became much worse.  During this entire time she states that her  bowels have been moving, she has seen no blood in her stool.  Then last  evening the pain became much worse, she came to the emergency room and was  evaluated.  At that time she had a temperature of 102.  Lab work revealed an  elevated white count of 17,800, platelet count 284,000, 86% neutrophils, 9%  lymphocytes, 5% monocytes.  Urinalysis revealed 11-20 wbcs, a few bacteria.  Chemistry studies -- sodium 138, potassium 3.7, chloride 108, bicarb 25,  glucose 117, BUN 15, creatinine 1.0, calcium 8.5, total protein 6.4, albumin  3.3, SGOT 19, SGPT 14, alkaline phosphatase 64, total bilirubin 0.7, lipase  29.  CT scan of the abdomen at that time revealed extreme, but extensive  sigmoid diverticulosis with possible diverticulitis.  She was given Cipro,  returned home and developed nausea, vomiting and diarrhea, then diffuse  abdominal pain again and returned to the emergency room.  She states she has  had loose bowel movements.  She denies cough.  She denies any urinary  discomfort.   ALLERGIES:  SHE HAS NO KNOWN DRUG ALLERGIES.   CURRENT MEDICATIONS:  1.   Altace 2.5 mg a day, multivitamin once a day.  2.  Plavix 75 mg daily.  3.  Prevacid 15 mg daily.  4.  Toprol 100 mg daily.  5.  Aspirin 81 mg daily.  6.  Miacalcin nasal spray once a day.  7.  Mobic 15 mg a day.  8.  VYTORIN 10/40 mg once a day.   PAST MEDICAL HISTORY:  1.  Remarkable for hypertension  2.  Osteoporosis.  3.  Hypercholesterolemia.  4.  Coronary artery disease with a CYPHER stent placed and an 80% second      obtuse marginal lesion in October 2004.  Also noted ot have a 50-60% LAD      lesion.  5.  She has calcium pyrophosphate disease of the wrists.  6.  She has had fairly severe spinal stenosis and is status post lumbar L2-  L3 laminectomy and diskectomy in April 2005 by Dr. Wynetta Emery.  7.  She has osteoporosis.  8.  Diverticulosis.  9.  She had a colonoscopy in 1998, flex-sig and BE in 2000.  10. She has had GERD.   PREVIOUS SURGERY:  1.  She is status post vaginal hysterectomy in 1980 for fibroids.  2.  She has had a left total knee replacement in 1992.  3.  Left shoulder surgery 2002.  4.  Right shoulder surgery in the past.  5.  She has had a right total knee replacement done in 1998, redone in 1999.   FAMILY HISTORY:  Mother died at age 107 of old age.  Sister had coronary  artery disease, daughter has had breast cancer.  One daughter died with  cerebral palsy.  One daughter died with leukemia.  One son is in good  health.   SOCIAL HISTORY:  [name] is widowed, her husband was a physician.  She  lives at [facility].   REVIEW OF SYSTEMS:  No headache.  No change in vision or hearing.  No  shortness of breath.  Currently no chest pain, but she has had chest pain  off and on with her abdominal pain.   PHYSICAL EXAMINATION:  Blood pressure 114/72, pulse rate 126, temperature  102.3.  HEENT:  Pupils equal, round and react to light.  Disks are sharp.  TMs  normal.  Oropharynx moist.  LUNGS:  Clear.  HEART:  Irregular tachycardia.  ABDOMEN:  Revealed  markedly decreased bowel sounds, diffuse tenderness,  possible rebound, although difficult to tell because of the diffuse  tenderness.  RECTAL:  Tenderness.  There was brown mucus.  Guaiac pending.   ASSESSMENT:  1.  Abdominal pain, history and clinical course, CT most consistent with      diverticulitis.  She is failing outpatient therapy with persistent      nausea and vomiting, and abdominal pain.  Also would consider ischemic      bowel.  2.  Degenerative joint disease.  She has upcoming surgery in the left knee      by Dr. Chaney Malling.  3.  Tachycardia secondary to inability to take her Toprol and the abdominal      pain.   PLAN:  Will admit.  Will start intravenous fluids, Unasyn, Phenergan.  Will  follow her CBC, electrolytes, and abdominal exam.  Attempt to restart her  Toprol.                                                Hal T. Pete Glatter, M.D.    HTS/MEDQ  D:  11/16/2003  T:  11/16/2003  Job:  [id number]

## 2010-08-04 NOTE — H&P (Signed)
NAME:  JAMIRAH, ZELAYA                ACCOUNT NO.:  [id number]   MEDICAL RECORD NO.:  [id number]          PATIENT TYPE:  INP   LOCATION:  1826                         FACILITY:  [facility]   PHYSICIAN:  Hal T. Stoneking, M.D. DATE OF BIRTH:  1920-09-04   DATE OF ADMISSION:  04/24/2005  DATE OF DISCHARGE:                                HISTORY & PHYSICAL   PROBLEM:  Shortness of breath.   HISTORY OF PRESENT ILLNESS:  [name] is a very pleasant 77 year old  white female patient of Dr. Ann Maki Stoneking's with a two-week history of  increasing shortness of breath.  Patient developed a dry cough four days ago  and began feeling feverish at that time.  She was seen by the nurse at  [facility] two days ago.  She had a temperature of 100.1 at that time and  was found to be tachycardic with a heart rate of 105.  She presented to our  office yesterday.  Chest x-ray was unremarkable.  CPK and troponin negative.  Due to her history of pulmonary embolism decision was made to obtain a CT of  the chest which showed nothing acute.   This morning the daughter called me saying her shortness of breath worsened  over the night.  In fact, she was placed on O2.  She continues to have a low-  grade fever and her heart rate continues to be elevated.  EKG shows a new  left bundle branch block.  Echocardiogram today shows decreased LV function  with an EF of 40%.  Due to her fever and tachycardia in addition to EKG  changes and decreased LVEF she is being admitted.   REVIEW OF SYSTEMS:  Denies any chills.  Low grade fever as above.  CHEST:  Some wheezing, shortness of breath as above.  Denies any chest pressure.  Denies any pleuritic chest discomfort.  Has some chest wall pain anteriorly  on occasion when she coughs.  GI:  Denies symptoms.  GU:  Denies symptoms  consistent with UTI.  EXTREMITIES:  No edema.   PAST MEDICAL HISTORY:  1.  Coronary artery disease post stent placement in 2004 and 2005.  2.   Diverticulitis.  3.  Hypertension.  4.  PE in 2005, completed nine months of Coumadin.  5.  Dyslipidemia.  6.  GERD.  7.  Osteoporosis.  8.  Spinal stenosis status post L2-L3 laminectomy.  9.  Peptic ulcer disease.   PAST SURGICAL HISTORY:  1.  Vaginal hysterectomy 1980.  2.  Total knee replacement 2002.  3.  Colon resection secondary to diverticulitis 2005.  4.  Synovectomy, left knee 2005.  5.  Left shoulder surgery in 2005.   MEDICATIONS:  1.  Prevacid one p.o. daily.  2.  Miacalcin nasal spray one nostril daily alternating nostril.  3.  Vytorin 10/40 one a day.  4.  Toprol 100 mg daily.  5.  Detrol LA 4 mg daily.   ALLERGIES:  NEOSPORIN, DETROL, VERELAN.  Intolerant of PROTONIX.   SOCIAL HISTORY:  Widowed.  Denies any tobacco or ETOH.   FAMILY HISTORY:  Significant for coronary artery disease.   OBJECTIVE DATA:  VITAL SIGNS:  Weight is 163 which is stable.  Temperature  100.4, pulse 100, blood pressure 110/70, respirations 20, O2 saturations on  room air 88% on 2 L O2 94%.  GENERAL:  She is alert, in no acute distress.  Appears weak.  HEENT:  Eyes:  PERRLA.  No nystagmus.  Ear, nose, and throat unremarkable.  NECK:  No JVD.  HEART:  Rapid S1, S2.  Heart sounds are distant.  I hear no murmur today.  No rub or S3.  Breath sounds are clear with the exception of a rare wheeze.  A dry cough is noted.  ABDOMEN:  Nontender.  EXTREMITIES:  No edema.  NEUROLOGIC:  Nonfocal examination.   LABORATORY DATA:  CBC __________ 12.3, hematocrit 36.3, RBC 4.27, MCV 84.9,  MCHC 34, platelets 212,000, white cells 5600, neutrophils 71%.  Chemistry  test reveals glucose 107, BUN 13, creatinine 1, sodium 133, potassium 3.8,  chloride 97, CO2 26, calcium 8.6, total protein 7, albumin 3.5, anion gap  13.8, total bilirubin 0.6, ALP 72, AST 24, ALT 11.  BNP 223.  TSH 5.96.  Sedimentation rate 60.  A urinalysis revealed +2 protein, trace ketones, +1  blood, rare wbc's, 0-5 rbc's.  Troponin  less than 0.2 on April 23, 2005  and CPK 71.   Spiral CT of the chest on April 23, 2005 revealed no evidence of acute PE.  Chronic PE was noted as well as hiatal hernia.  2-D cardiac echocardiogram  today revealed decreased LV function from prior EF 40%.   IMPRESSION:  1.  Tachycardia.  2.  Fever.  3.  Bronchitis versus other.   PLAN:  Admit to monitored bed.  Continuous O2 to keep O2 saturations greater  than or equal to 92%.  Antibiotics as well as Xopenex.  Cardiology consult.      Tamera C. Lewis, N.P.    ______________________________  Sunday Spillers. Pete Glatter, M.D.    TCL/MEDQ  D:  04/24/2005  T:  04/24/2005  Job:  [id number]

## 2010-08-04 NOTE — Op Note (Signed)
NAMEDANELLA, PHILSON                ACCOUNT NO.:  [id number]   MEDICAL RECORD NO.:  [id number]          PATIENT TYPE:  INP   LOCATION:  5028                         FACILITY:  [facility]   PHYSICIAN:  Rodney A. Mortenson, M.D.DATE OF BIRTH:  22-Jul-1920   DATE OF PROCEDURE:  03/07/2004  DATE OF DISCHARGE:                                 OPERATIVE REPORT   PREOPERATIVE DIAGNOSIS:  Painful left total knee.   POSTOPERATIVE DIAGNOSIS:  Painful left total knee.   OPERATION PERFORMED:  1.  Synovectomy, left knee.  2.  Exchange tibial bearing insert, left knee.   ANESTHESIA:  General.   SURGEON:  Rodney A. Chaney Malling, M.D.   ASSISTANT:  Claude Manges. Cleophas Dunker, M.D.   DESCRIPTION OF THE OPERATION:  The patient was placed on the operative table  in the supine position with a pneumatic tourniquet placed on the left upper  thigh.  The left lower extremity was prepped with DuraPrep and draped in the  usual manner.  A Vi-Drape was placed over the operative site.  The leg was  exsanguinated with an Esmarch.  The tourniquet was elevated.   A midline incision started above the patella and was carried down to the  tibial tubercle.  Skin edges were retracted.  A line of sutures along the  medial parapatellar side were totally removed.  A long medial parapatellar  incision was then made using cutting cautery.  The patella was everted.  There were adhesions in the superior pouch and a low-grade synovitis about  the knee.  A complete synovectomy of the knee was done.  Part of the fat pad  was excised.  The patella was then everted.  All the soft tissue around the  femoral component and the tibial component were released.   The femoral and tibial components were examined very carefully and they were  not loose.  There was good fixation. The tibial poly was partially worn  through, but did not have metal-on-metal erosion.  With the knee flexed to  100 degrees the tibial poly was removed in a standard manner.   The poly on  the posterior aspect of the patella was visualized and this was in excellent  condition.  It was felt that replacement of the patella was not necessary.  An extensive synovectomy was done about the knee. Pulsating lavage was used  to clean out the knee.  No evidence of infection.  A trial poly was  inserted.  The knee was put through a full range of motion and was quite  stable in full flexion, full flexion an varus and valgus stress.  The trial  poly was removed and a #7 Series II 8 mm Osteonics tibial bearing insert was  then snap-fitted in position onto the tibial tray.  The knee was put through  a full range of motion.  The knee was extremely stable.   The tourniquet was dropped.  Bleeders were coagulated.  A Hemovac drain was  inserted in the knee.  The long knee parapatellar incision was hen closed  with interrupted Ethibond sutures.  Vicryl was used to  close the  subcutaneous tissue and stainless steel staples were used to close the skin.  A large bulky pressure dressing and sterile dressing were applied.   The patient was taken to the recovery room in excellent condition.  She  tolerated this procedure in extremely well.   DRAINS:  Hemovac.   COMPLICATIONS:  None.       RAM/MEDQ  D:  03/07/2004  T:  03/08/2004  Job:  [id number]

## 2010-08-04 NOTE — Op Note (Signed)
[name], [name]                ACCOUNT NO.:  [id number]   MEDICAL RECORD NO.:  [id number]          PATIENT TYPE:  INP   LOCATION:  6533                         FACILITY:  [facility]   PHYSICIAN:  Francisca December, M.D.  DATE OF BIRTH:  [birth year]   DATE OF PROCEDURE:  12/21/2005  DATE OF DISCHARGE:  12/22/2005                                 OPERATIVE REPORT   PROCEDURES PERFORMED:  1. Left subclavian venogram.  2. Coronary sinus venogram.  3. Insertion dual-chamber biventricular implantable cardiac defibrillator.  4. Defibrillation threshold testing.   INDICATIONS:  [name] is an 77 year old woman with NYHA class-III heart  failure symptoms and a nonischemic cardiomyopathy.  EF last assessed in  July2007 was 25%.  She also has a wide-complex left bundle branch block  on ECG.  She is therefore brought to the catheterization laboratory at this  time for insertion of a dual-chamber/biventricular/implantable cardiac  defibrillator for treatment of her heart failure and for prophylaxis under  the SCUD-HEFT criteria.   PROCEDURAL NOTE:  The patient was brought to the cardiac catheterization  laboratory in the fasting state.  The left prepectoral region was prepped  and draped in the usual sterile fashion.  Local anesthesia was obtained with  infiltration of lidocaine with epinephrine throughout the left prepectoral  region.  A left subclavian venogram was then performed with a peripheral  injection of 20 mL of Omnipaque.  A digital cine angiogram was obtained and  road mapped to guide future left subclavian puncture.  It did demonstrate  the vein to be widely patent and coursing in a normal fashion over the  anterior surface of the first rib and beneath the middle third of the  clavicle.  There was no evidence for persistance of the left superior vena  cava.  A 6- to 7-cm incision was then made in the deltopectoral groove and  this was carried down by sharp dissection with  electrocautery to the  prepectoral fascia.  There a plane was lifted and a pocket formed inferiorly  and medially using blunt dissection.  The pocket was then packed with a 1%  kanamycin-soaked gauze.  The left subclavian vein was then punctured three  times using an 18-gauge thin-wall needle through which was passed a 0.038-  inch type-J guidewire.  Over the initial guidewire, a 7-French tearaway  sheath and dilator were advanced.  The dilator and wire were removed and the  ventricular lead was advanced to the level of the right atrium and the  sheath was torn away.  Using standard technique and fluoroscopic landmarks,  the lead was manipulated in the right ventricular apex.  There excellent  pacing parameters were obtained as will be noted below.  This was an active  fixation lead and the screw was advanced as appropriate.  The lead was  tested for diaphragmatic pacing at 10 volts and none was found.  The lead  was then sutured into place using three separate 0 silk ligatures.  Over the  second guidewire a second 7-French tearaway sheath and dilator were  advanced.  Again, the dilator  and wire were removed and the atrial lead was  advanced to the level of the right atrium.  The sheath was torn away.  Using  standard technique and fluoroscopic landmarks, the lead was manipulated into  the right atrial medial free wall.  There excellent pacing parameters were  obtained as will be noted below.  This again was an active fixation lead and  the screw was advanced as appropriate.  The lead was tested for  diaphragmatic pacing at 10 volts and none was found.  The lead was then  sutured into place using three separate 0 silk ligatures.  The coronary  sinus was then cannulated using a Medtronic MP 6216 guiding catheter.  Attempts using an MB2 guiding catheter were unsuccessful.  The coronary  sinus was initially located using a Wholey floppy-tip guidewire.  The  guidewire was then advanced.   Because of the wide curve it subselected a  left lateral vein.  This was confirmed by cine angiography in AP, left  lateral and RAO projections.  Cine angiograms were obtained for each and  road mapped to guide subsequent lead placement.  The lateral vein was then  accessed using a Prowater guidewire.  Over the guidewire a Medtronic 4194  left ventricular bipolar lead was advanced into place.  Initial placement  was unsatisfactory due to high thresholds and low pacing of the diaphragm.  It was then manipulated into a more inferior branch of the lateral vein.  There excellent pacing parameters were obtained as will be noted below.  The  guiding catheter was then removed by the slit technique, and the lead was  sutured into place using three separate 0 silk ligatures.  There was  diaphragmatic pacing at 4.5 volts; however, the threshold was low enough to  set the pacing output well below this.   The kanamycin-soaked gauze was then removed from the pocket, and the pocket  was copiously irrigated using 1% kanamycin solution.  The leads were then  attached to the pace shock generator under the supervision of the Medtronic  technician.  Each lead was placed in the appropriate receptacle, tightened  into place and tested for security.  The leads were then wound beneath the  pace shock generator and this was placed in the pocket.  A 0 silk anchoring  suture was applied.   We then prepared for defibrillation threshold testing.  The patient received  a total of 6 mg of midazolam and 100 mcg of fentanyl.  Adequate sedation was  obtained, and we proceeded with first defibrillation threshold testing by  the shock on T technique.  Prompt ventricular fibrillation was induced.  The  arrhythmia was detected and a 20 joule shock delivered at 47 ohms with  prompt return of sinus rhythm.  The total detection and charge time was 6  seconds.  The charge time itself was 3.6 seconds.  This was repeated x2  with the same result.  One dropout noted on the initial defibrillation.   The pocket was then closed using 2-0 Vicryl in a running fashion for the  subcutaneous layers.  Two layers were applied.  The skin was approximated  using 4-0 Vicryl in a running subcuticular fashion.  Steri-Strips and a  sterile dressing were applied, and the patient was transported to the  recovery area in stable condition.   EQUIPMENT DATA:  The pace shock generator is a Medtronic Hope, model  number D5359719, serial number [id number].  The atrial lead is Medtronic  model  number [id number], serial number [id number].  The right ventricular pace  shock lead is a Medtronic model number [id number], serial number [id number]. The  left ventricular lead is a Medtronic model number [id number], serial number  [id number].   PACING DATA:  The atrial lead detected a 2.1 millivolt P wave.  The pacing  threshold was 0.8 volts at 0.5 millisecond pulse width.  The impedance was  556 ohms resulting in a current at capture threshold of 1.8 MA.  The right  ventricular lead detected an 8.7 millivolt R wave.  The pacing threshold was  0.6 volts at 0.5 millisecond pulse width.  The impedance was 842 ohms,  resulting in a current at capture threshold of 0.9 MA.  The left ventricular  lead detected a greater than 30 millivolt R wave.  The pacing threshold was  0.6 volts at 0.5 millisecond pulse width.  The impedance was 1011 ohms  resulting in a current at capture threshold of 0.7 MA.  As noted, there was  diaphragmatic stimulation at 4.6 volts and the output was programmed at 2.5  volts at 5 millisecond pulse width.      Francisca December, M.D.  Electronically Signed     JHE/MEDQ  D:  12/21/2005  T:  12/22/2005  Job:  [id number]   cc:   Hal T. Stoneking, M.D.

## 2010-08-04 NOTE — Discharge Summary (Signed)
NAME:  [name], [name]                          ACCOUNT NO.:  [id number]   MEDICAL RECORD NO.:  [id number]                   PATIENT TYPE:  INP   LOCATION:  4740                                 FACILITY:  [facility]   PHYSICIAN:  Kela Millin, M.D.             DATE OF BIRTH:  Aug 18, 1920   DATE OF ADMISSION:  11/16/2003  DATE OF DISCHARGE:  12/04/2003                                 DISCHARGE SUMMARY   DISCHARGE DIAGNOSES:  1.  Diverticulitis.  2.  Pulmonary embolus.  3.  Hypertension.  4.  Coronary artery disease, status post stent in 2004.  5.  Osteoporosis.  6.  History of diverticulosis.  7.  History of spinal stenosis and spondylolisthesis.  8.  Gastroesophageal reflux disease.  9.  Degenerative joint disease.   CONSULTATIONS:  Surgery -- Dr. Jimmye Norman.   PROCEDURES:  Peripherally inserted central catheter line, patient received  total nutrient admixture/total parenteral nutrition while in the hospital.   HISTORY:  The patient is an 77 year old white female with past medical  history significant for diverticulosis, who presented with complaints of not  feeling well for about 3 days.  She stated that she just felt run down and  had a decreased appetite.  In the p.m. prior to presentation, she developed  migratory, sharp abdominal pains that seemed to go away after a while, but  then in the evening, they became worse.  During that entire time, she stated  that her bowels had been moving and she had not noted any blood in her  stools.  She came to the ER because of the worsening of the abdominal pain  and at that time, she was found to have a temperature of 102, and lab work  revealed a white count of 17,800 with a platelet count of 284,000; also, her  urinalysis revealed 11-20 wbc's and a few bacteria.  CT scan of her abdomen  revealed extensive sigmoid diverticulosis with possible diverticulitis.  She  was given Cipro in the ER and returned home, but developed nausea,  vomiting  and diarrhea with diffuse abdominal pain, again returned to the ER.  She  stated that she had loose bowel movements, denied cough, also denied any  urinary symptoms.   PHYSICAL EXAMINATION:  The physical exam upon admission by Dr. Ann Maki T.  Stoneking revealed a blood pressure of 114/72 with a pulse of 126, a  temperature of 102.3 and the pertinent findings:  Her lungs were clear to  auscultation and on heart exam, she was tachycardic.  Her abdomen revealed  markedly decreased breath sounds with diffuse tenderness and possible  rebound, although difficult to tell because of the diffuse tenderness.  On  her rectal exam, it was tender, there was brown mucus.   LABORATORY DATA:  The CT scan is as stated above with extensive sigmoid  diverticulosis and possible diverticulitis.   Her sodium was 138, potassium 3.7,  chloride 108, bicarb 25, glucose 117, BUN  15, creatinine 1.0, the calcium 8.5 with a total protein of 6.5, the albumin  3.3, her SGOT was 19, SGPT 14, alkaline phosphatase 64, total bilirubin 0.7  and the lipase 29.  Her white cell count again was 17,800 with platelet  count 284,000 with 86% neutrophils.   HOSPITAL COURSE:  PROBLEM #1 - DIVERTICULITIS:  Upon admission, the patient  was started on IV antibiotics and also made n.p.o. for bowel rest, and  started on IV fluids.  Because the patient's symptoms were slow to resolve,  she was started on total parenteral nutrition/TNA after a PICC line was  placed for nutritional support.  Surgery was consulted and saw the patient,  and they planned on doing surgery, but because the patient's hospital stay  was complicated by pulmonary embolus and also patient's symptoms began to  improve, they decided to delay the surgery for 6-8 weeks.  The patient is to  follow up with [facility] on December 14, 2003.  The patient  gradually improved throughout the course of her hospital stay with a  resolution of her nausea and  vomiting and eventually the TNA was  discontinued, and she has been tolerating p.o. well.  She has also been  having good bowel movements with no hematochezia.  She has remained afebrile  and her last white cell count was 8 and she is hemodynamically stable, and  tolerating p.o. well; she will be discharged home to follow up with her  primary care physician at this time.   PROBLEM #2 - PULMONARY EMBOLUS:  The patient developed an episode of  shortness of breath while she was in the hospital and evaluation included a  V/Q scan initially which was read as intermediate probability for an  embolus.  The patient was started on heparin and lower extremity Dopplers  were done, and they were negative for DVT.  A followup CT angiogram was done  and it revealed a pulmonary embolus in the lingular area.  The patient was  continued on heparin and was started on Coumadin and this was continued  until the INR was therapeutic, and then the heparin and Coumadin were  overlapped.  Today, the heparin was discontinued and her INR today is at 2.9  at the time of discharge.  The patient is to have her PT/INR checked in a.m.  at home by [facility] and the results of that are to be called to  her primary care physician, Dr. Merlene Laughter, for further adjustment of her  Coumadin dose.  She is hemodynamically stable at this time with no chest  pain and no shortness of breath, and will be discharged on Coumadin, to  follow up with her primary care physician.   PROBLEM #3 - HYPERTENSION:  Her blood pressure was maintained on Toprol in  the hospital.  The Altace was held because her blood pressures were in the  90s to low-hundreds.  The patient is to follow up with her primary care  physician and cardiologist, Dr. [name], and the Altace to be  restarted when appropriate.   PROBLEM #4 - CORONARY ARTERY DISEASE, STATUS POST STENT:  Dr. Fraser Din saw the patient on a courtesy visit while she was in the  hospital.  The patient  did not have any chest pain throughout her hospital stay.  She is to  continue her Toprol, Vytorin and aspirin upon discharge, and as already  discussed, the Altace was held  in the hospital and is to be restarted upon  followup when appropriate/when her blood pressures can tolerate it.   PROBLEM #5 - OSTEOPOROSIS:  The patient is to resume her Miacalcin upon  discharge.   PROBLEM #6 - GASTROESOPHAGEAL REFLUX DISEASE:  The patient was maintained on  Prevacid while in the hospital and this is to be continued upon discharge.   PROBLEM #7 - DEGENERATIVE JOINT DISEASE:  The patient is to follow up with  Dr. [name] as scheduled for her upcoming left knee surgery.   DISCHARGE CONDITION:  Improved/stable.   DISCHARGE DIET:  Low residue, avoid nuts.   DISCHARGE MEDICATIONS:  1.  Enteric-coated aspirin 81 mg 1 p.o. daily.  2.  Coumadin 5 mg as directed and per the primary care physician.  3.  Prevacid 30 mg 1 p.o. daily.  4.  Toprol-XL 100 mg 1 p.o. daily.  5.  Vytorin 10/40 mg 1 p.o. daily.  6.  Miacalcin 1 spray daily.  7.  Patient instructed to discontinue Mobic.  8.  Patient instructed to discontinue Plavix.   FOLLOWUP CARE:  1.  Dr. Merlene Laughter in 3 days.  2.  [facility] to check PT/INR in a.m. and call to Dr. Merlene Laughter.  3.  Surgery -- Dr. Apolonio Schneiders. Leonie Man on December 14, 2003.  4.  Follow up with Dr. Fraser Din, cardiologist, as scheduled.       ACV/MEDQ  D:  12/04/2003  T:  12/04/2003  Job:  [id number]   cc:   Hal T. Stoneking, M.D.  [address]  [place], [place] [address]  Fax: [phone]   [name], M.D.  [address]  [place]  [place] [address]  Fax: [phone]

## 2010-08-04 NOTE — Op Note (Signed)
NAMETHRESEA, DOBLE                          ACCOUNT NO.:  [id number]   MEDICAL RECORD NO.:  [id number]                   PATIENT TYPE:  INP   LOCATION:  3004                                 FACILITY:  [facility]   PHYSICIAN:  Donalee Citrin, M.D.                     DATE OF BIRTH:  [birth year]   DATE OF PROCEDURE:  07/16/2003  DATE OF DISCHARGE:                                 OPERATIVE REPORT   PREOPERATIVE DIAGNOSIS:  Severe degenerative scoliosis and lumbar spinal  stenosis at L2-3 and L3-4 with left-sided L3 and L4 radiculopathies and  ruptured disk L2-3.   PROCEDURE:  Decompressive laminectomy left L2-3 and L3-4, microscopic  diskectomy L2-3 and microscopic dissection of the left L3 and L4 nerves.   SURGEON:  Donalee Citrin, M.D.   ASSISTANT:  Reinaldo Meeker, M.D.   ANESTHESIA:  General endotracheal.   INDICATIONS FOR PROCEDURE:  The patient is a very pleasant 77 year old  female who has had longstanding back and left leg pain refractory to all  forms of conservative treatment with physical therapy, epidural steroid  injections, anti-inflammatories, oral steroids, and oral pain medicine.  The  patient's pain was radiating into the left hip, down the left leg,  and  anterior shin consistent with an L3 and L4 radiculopathy.  Preoperative  imaging showed severe spinal stenosis and degenerative scoliosis  predominantly at L2-3 and L3-4 with compression of the left L3 and L4 nerve  roots correlating with a clinical syndrome and failure of conservative  treatment, degree of pain, and MRI appearance, the patient was recommended  an unilateral decompressive laminectomy at L2-3 and L3-4 with evaluation of  disk spaces.   DESCRIPTION OF PROCEDURE:  The patient was brought to the OR and positioned  prone on the Wilson frame.  Back was prepped and draped in the usual sterile  fashion.  Preoperative x-ray localized the L3 pedicle.  A midline incision  was made after infiltration of 10 mL of  lidocaine with epinephrine.  Bovie  electrocautery was used to dissect the subcutaneous tissue.  Subperiosteal  dissection was carried out to the lamina of what was believed to be the L2,  L3, and L4 lamina.  Intraoperative x-ray localized a probe just underneath  the 1-2 disk space, so the two interspaces below this were exposed.  The  facet complex was noted to be markedly hypertrophied and overgrowing in the  lamina of L3 and L4.  Once the lamina were identified and the medial aspect  of the facet complexes were drilled down with a high speed drill, then the  anterior aspect of the lamina was also drilled down first with attention  turned to L2-3.  The inferior aspect of L2 was underbitten with a 3 and 4 mm  Kerrison punch exposing ligamentum flavum which was removed in piecemeal  fashion exposing the thecal sac.  The medial aspect  of the facet complex at  L2-3 was also removed.  The L3 lamina was noted to be collapsed and  overlying the L4 lamina so I was unable to develop a plane underneath the L3  lamina, so working from the decompression performed at L2 with virtually all  of the lamina of L2 removed, the entire lamina of L3 was removed, and  superior aspect of the lamina of L4.  There was ligamentum flavum also  removed in a piecemeal fashion on this side and the thecal sac was clearly  visualized.  Then under microscopic illumination, the lateral gutters were  dissected free and noted to be tremendously hypertrophic at the facet  complexes of 2-3 and 3-4 overlying and stenotic against the dura at these  levels.  Using a 4 Penfield, the dura was dissected off these and the 2 and  3 mm Kerrison punches were used to underbite the facet complexes at these  two levels under the microscope.  After the facet complexes underbitten at  each level, decompression of the central thecal sac, a 4 Penfield was used  to identify the pedicles of L3 and L4 and the L3 and L4 nerve roots were  also  identified.  These neuroforamen were opened up using a 3 mm Kerrison  punch underbiting the facet complex and opening up the L3 and L4  neuroforamen.  Then attention was taken to evaluation of the disk spaces.  First at the 2-3 disk space, there was noted to be a significant disk  rupture laterally that was causing some compression of the proximal aspect  of the L3 nerve, so the L3 nerve was reflected medially with the D'Errico  nerve retractor/  __________ was made with a 11 blade scalpel and pituitary  rongeurs were used to remove the lateral aspect of the disks pace.  This all  decompressed the lateral recess and the L3 nerve was easily probed with a  coronary dilator and angled hockey stick.  Gelfoam was then laid in this  defect.  Then at L3-4 the disk space was palpated.  This was noted to be  bulging, however, was not felt to be herniated and due to risks of  generating further instability with underlying baseline degenerative  scoliosis, this dissection was left alone.  The L4 nerve root was not under  compression after the foraminotomy and was explored with a coronary dilator  and angled hockey stick and noted to have no further stenosis.  Then the  wound was copiously irrigated and Gelfoam was removed from the disk space at  2-3.  The wound was copiously irrigated and meticulous hemostasis was  maintained.  Gelfoam was then overlaid on top of the dura from the  decompression site from 2 to 4.  Then the muscle and fascia were  reapproximated with 0 interrupted Vicryl, the subcutaneous tissue was closed  with 2-0 interrupted Vicryl, and the skin was closed with a running 4-0  subcuticular.  Benzoin and Steri-Strips were applied. The patient went to  the recovery room in stable condition.  At the end of the case, needle,  sponge, and instrument counts correct.                                               Donalee Citrin, M.D.   GC/MEDQ  D:  07/16/2003  T:  07/17/2003  Job:  [id number]

## 2010-08-04 NOTE — H&P (Signed)
NAMELYNETTA, TOMCZAK                          ACCOUNT NO.:  [id number]   MEDICAL RECORD NO.:  [id number]                   PATIENT TYPE:  INP   LOCATION:  4740                                 FACILITY:  [facility]   PHYSICIAN:  Jimmye Norman III, M.D.               DATE OF BIRTH:  October 15, 1920   DATE OF ADMISSION:  11/16/2003  DATE OF DISCHARGE:                                HISTORY & PHYSICAL   Dear Dr. Lendell Caprice:   Thank you very much for asking me to see Ms. Kanaan, a very pleasant 77-  year-old female with recent diagnosis of diverticulitis, based on CT scan  done on August 29, and diffuse abdominal pain.   The patient started having discomfort over the past weekend and had pain and  came to the emergency room where she was evaluated, thought to have a  urinary tract infection, was sent home on some oral ciprofloxacin and  Flagyl.  However, prior to the patient being able to take any of those  medications, she got readmitted through the emergency room for more diffuse  abdominal pain.  On a previous visit, a CT scan was done which showed  possible diverticulitis.  Repeat CT scan was not done on the second  admission through the ED; however, I was called today because patient's pain  had gotten more diffuse while on IV antibiotics.   PAST MEDICAL HISTORY:  1.  Hypertension.  2.  Coronary artery disease with stent placement in October 2004.  3.  Hypercholesterolemia.  4.  Osteoporosis.  5.  History of spinal stenosis and spondylolisthesis.  6.  Diverticulosis.  7.  Colonoscopy in 1998.  8.  Barium enema in 2000.  9.  Gastroesophageal reflux disease.   PAST SURGICAL HISTORY:  1.  Hysterectomy.  2.  L2-3 laminectomy and diskectomy.  3.  Bilateral knee surgeries.  4.  Left total shoulder replacement and previous right shoulder surgery.  5.  Total knee on the right side.   CURRENT MEDICATIONS ON ADMISSION:  1.  Altace 2.5 mg a day.  2.  Plavix 75 mg a day.  3.  Aspirin 81 mg a  day.  4.  Prevacid 15 mg a day.  5.  Toprol 100 mg a day.  6.  Miacalcin nasal spray once a day.  7.  Mobic 15 mg a day.  8.  Vitorin 10/40 one once a day.   ALLERGIES:  No known drug allergies.   PHYSICAL EXAMINATION:  GENERAL:  Currently she is sitting in her room with  her daughters, seems comfortable when not moving.  The patient does not look  to be in acute distress when in rest position but does grimace and moan a  bit when she has to move significantly in bed or out of bed.  VITAL SIGNS:  Temperature 98.8, pulse 92, blood pressure 108/75.  She is in  normal sinus rhythm.  O2 saturation 95% on room air.  ABDOMEN: Mildly to moderately distended with active bowel sounds which do  not appear to be diminished.  She has tenderness mostly in the left lower  quadrant in somewhat suprapubic area and some referred pain to the left side  when you palpate in other areas of the abdomen.  She has guarding on the  left side but not diffuse guarding.   LABORATORY AND X-RAY DATA:  Laboratory studies demonstrate a white count of  16,400, slightly up from yesterday.  UA is positive for urinary tract  infection.  Mildly lowered hemoglobin of 11.0, platelets normal.  No  bleeding time studies have been done, although she has had coagulation  studies.  PT 13.9, PTT 41 seconds, maximum being 37.   IMPRESSION:  Diverticulitis based on previous CT scan with left lower  quadrant tenderness and abdominal pain.  Because there is a question of  whether or not this has spread and gotten more diffuse, I think a three-way  abdominal film to assess her free air would be helpful.  Certainly if the  patient has free air on the films, then surgery is warranted right away, and  she would need to have colectomy and colostomy.   The other scenarios are if she does not have free air on a three-way, we  will treat this as severe diverticulitis.  I would like to change her to  Primaxin IV antibiotics as opposed to  Unasyn as being stronger for intra-  abdominal infections.  If she does not continue to improve on IV Primaxin  therapy, then she may require surgery sooner than later.  However, currently  I do not believe she requires an emergency operation.  If she should worsen,  then of course a colectomy and colostomy will be necessary or Hartmann's  type procedure.   I have discussed the risks and benefits, both operative and nonoperative  therapy with the patient and her daughter, who was in the room along with a  very good friend.  They understand the risks and benefits of both and at the  current time would like to go with non-operative therapy if that is the  safest thing for her.  Currently, I do not have an overwhelming reason to  have to do surgery.  However, if her condition should change for the worse  at any time, then urgent surgery may be necessary with the colectomy and  colostomy.  The family does understand this. We will go ahead and review her  CT scan  and review those abdominal films.  Also, I want to check an IV  bleeding time to assess the function of her platelets which currently are at  a normal level.                                                Kathrin Ruddy, M.D.    JW/MEDQ  D:  11/18/2003  T:  11/18/2003  Job:  [id number]   cc:   Hal T. Stoneking, M.D.  [address] [place], [place] [address]  Fax: [phone]   Corinna L. Lendell Caprice, MD

## 2010-08-04 NOTE — H&P (Signed)
Christine [name] [name], [name]                ACCOUNT NO.:  [id number]   MEDICAL RECORD NO.:  [id number]          PATIENT TYPE:  INP   LOCATION:                               FACILITY:  [facility]   PHYSICIAN:  Rodney A. Mortenson, M.D.DATE OF BIRTH:  01-May-1920   DATE OF ADMISSION:  03/07/2004  DATE OF DISCHARGE:                                HISTORY & PHYSICAL   CHIEF COMPLAINT:  Left knee pain for one year.   HISTORY OF PRESENT ILLNESS:  This 77 year old white female patient presented  to Dr. [name] with a one-year-history of a gradual onset but  progressively worsening left knee pain.  She has a history of a left knee  replacement by Dr. Paul Dykes. [name] in 1991.  She has also had her right knee  replaced in 1985, and then revised in 1997, all by Dr. [name].  She has had no  known injury or any other surgery to that left knee, other than the knee  replacement.  At this point, the left knee pain is a constant sharp sensation, diffuse  about the joint with radiation at times up into her left hip. The pain  increases with walking and then decreases with heat and Vicodin.  The knee  feels like it shifts at times, and it swells.  There is no popping,  catching, grinding, locking, or giving way.  She has been ambulating with a  walker since October of this year, to help her with her unsteadiness due to  the pain.   ALLERGIES:  MOTRIN causes nausea.   CURRENT MEDICATIONS:  1.  Vytorin 10/40 mg, one tab p.o. q.a.m.  2.  Coumadin 2 mg, one tab q.o.d., alternating with a 3 mg tab q.o.d.  The      last dose should be on February 27, 2004.  3.  Toprol XL 100 mg, one tab p.o. q.a.m.  4.  Prevacid 15 mg, one tab p.o. q.a.m.  5.  Norco ES 7.5 mg/325 mg, two tab p.o. q.4-6h. p.r.n. pain.  6.  Colace 100 mg, one tab p.o. q.a.m. p.r.n.  7.  Miacalcin nasal spray, one squirt in one naris q.a.m., alternating naris      daily.   PAST MEDICAL HISTORY:  1.  Hypertension for five years.  2.  Hiatal  hernia and severe gastroesophageal reflux disease for many years.  3.  History of peptic ulcer in 1995.  4.  Coronary artery disease, treated with a stent in November 2004, followed      by Dr. [name].  5.  Diverticulosis, with history of diverticulitis in September 2005.  6.  Pulmonary emboli after diverticulosis surgery in September 2005, now      treated with Coumadin.  7.  Pseudo-gout in 2004.  8.  Degenerative disk disease and lumbar spinal stenosis, treated previously      with surgery by Dr. Donalee Citrin.  9.  Osteoporosis.  She denies any history of diabetes mellitus, thyroid disease, asthma, or any  other chronic medical condition, other than noted previously.   PAST SURGICAL HISTORY:  1.  Hysterectomy in 1981.  2.  Right total knee arthroplasty in 1985, by Dr. [name].  3.  Left total knee arthroplasty in 1991, by Dr. [name].  4.  Left total shoulder arthroplasty in 1998, by Dr. [name].  5.  Right rotator cuff debridement by Dr. Ollen Gross in 1999.  6.  Revision of right total knee arthroplasty by Dr. [name] in 1997.  7.  Removal of cataract in 2003.  8.  Degenerative disk disease of the lumbar spine, with spinal surgery for      stenosis in March 2005, by Dr. Wynetta Emery.  9.  Cardiac stent placed in November 2004.  10. Hemicolectomy due to diverticulosis, Dr. Jimmye Norman in October 2005.  The only complication she notes from surgery is the development of a  pulmonary emboli, possibly due to a percutaneous indwelling central catheter  line after her diverticulosis surgery in October 2005.   SOCIAL HISTORY:  She denies any history of cigarette smoking, alcohol use,  or drug use.  She is a widow who had five children, two of which have died.  She lives at [place] right now in a one-story apartment with two to three  steps into the main entrance.  Her medical doctor is Dr. [name]  #[phone].  Her cardiologist is Dr. [name] at [facility] [place].   FAMILY HISTORY:   Mother died at age 71, with an abdominal blockage.  Father  died at age 8, with a myocardial infarction.  She has two living sisters,  one age 48, with a history of a myocardial infarction, and one age 9, with  early Alzheimer's.  She did have one sister who passed away at age 72, with  pulmonary disease.  Her two daughters who passed away, one was age 33, with  leukemia, one was age 45, with a heart attack and cerebral palsy.  She has  three living children, two daughters and one son.  The oldest daughter is  age 55, with a history of breast cancer.  One daughter is age 20, and one  son age 33.   REVIEW OF SYSTEMS:  She has had vertigo approximately eight years ago, but  no problems since then.  She does have sleep apnea which she treats with a  CPAP machine.  She has a history of hepatitis-A, last episode in 1980.  She  has nocturia about three times a night.  She complains of occasional  tingling in her left hand.  She does wear glasses.  She has lost 30 pounds  since September of this year, due to the diverticulitis episode.  She does  have some urinary urgency and frequency.  She has a living will, and her  power of attorney is [employer].  All other systems are negative and  noncontributory.   PHYSICAL EXAMINATION:  GENERAL:  A well-developed and well-nourished thin  white female, in no acute distress.  She talks easily with the examiner.  She walks with a walker.  Mood and affect are appropriate.  Height 5 feet 2-  1/2 inches, weight 143 pounds.  BMI is 25.  VITAL SIGNS:  Temperature 97.1 degrees F, pulse 64, respirations 16, blood  pressure 112/60.  HEENT:  Normocephalic and atraumatic without frontal or maxillary sinus  tenderness to palpation.  Conjunctivae pink.  Sclerae anicteric.  Pupils  equal, round, reactive to light and accommodation.  EOM's intact.  No  visible external ear deformities.  Hearing is grossly intact.  A large amount  of cerumen in both ear canals, so  that I cannot see the tympanic  membranes.  Nose:  Nasal septum midline.  Nasal mucosa pink and moist  without exudates or polyps noted.  Buccal mucosa pink and moist.  Good  dentition.  Pharynx without erythema or exudate.  Tongue and uvula midline.  Tongue without fasciculations, and uvula rises equally with phonation.  NECK:  No visible masses or lesions noted.  No adenopathy or thyromegaly.  Carotids are +2 bilaterally without bruits.  A full range of motion and  nontender to palpation along the cervical spine.  CARDIOVASCULAR:  Heart rate and rhythm regular.  S1, S2 present without  rubs, clicks, or murmurs noted.  LUNGS:  Respirations even and unlabored.  Breath sounds clear to  auscultation bilaterally without rales or wheezes noted.  ABDOMEN:  A well-healed midline abdominal incision line.  Bowel sounds  present x4 quadrants.  Soft, nontender to palpation without  hepatosplenomegaly or CVA tenderness.  SPINE:  Wearing a lumbar support at this time.  Nontender to palpation along  the lumbar spine.  BREASTS/GU/RECTAL/PELVIC:  These examinations are deferred at this time.  MUSCULOSKELETAL:  She has well-healed scars on both shoulders.  No obvious  deformities of her upper extremities.  She has a decreased range of motion  of both shoulders in forward flexion and abduction with the left one being a  little bit more mobile than the right.  No other deformities noted of her  upper extremities with radial pulses +2 and strength 5/5.  She has a full  range of motion of her hips, ankles and toes bilaterally.  DP and PT pulses  are +2 bilaterally.  No lower extremity edema.  Her right knee has a well-healed midline incision.  No erythema or  ecchymosis.  She has full extension and flexion to 115 degrees without pain  or crepitus.  There is negative anterior drawer and no calf pain with  palpation.  Negative Homan's sign.  The left knee also has a well-healed  midline incision.  The skin is  intact without erythema or ecchymosis.  She  has full extension and flexion to 115 degrees without pain or crepitus.  There is a small +1 effusion in the knee.  She is stable to varus and valgus  stress, negative anterior drawer.  No calf pain with palpation.  Negative  Homan's sign.  NEUROLOGIC:  Alert and oriented x3.  Cranial nerves II-XII  are grossly  intact.  Strength is 5/5 in the bilateral upper and lower extremities.  Sensation intact to light touch, and deep tendon reflexes 2+ in the  bilateral upper and lower extremities.  RADIOLOGIC FINDINGS:  With multiple x-rays taken, it appears that the  polyethylene component of the prosthesis is markedly different from previous  films.   IMPRESSION:  1.  Painful left total knee replacement, possibly polyethylene component      wearing out.  2.  History of osteoarthritis of bilateral knees and left shoulder, status      post right total knee replacement with revision and left shoulder     replacement.  3.  Hypertension.  4.  Gastroesophageal reflux disease.  5.  Hiatal hernia.  6.  Coronary artery disease, treated with a stent.  7.  Pulmonary emboli in September 2005, currently treated with Coumadin.  8.  Diverticulosis, with history of diverticulitis in September 2005.  9.  Osteoporosis.  10. Sleep apnea.  11. Pseudo-gout.  12. Degenerative disk disease and  spinal stenosis of the lumbar spine,      status post surgery.  13. History of peptic ulcer disease in 1995.  14. History of hepatitis-A in 1980.   PLAN:  The patient will be admitted to [facility] on  March 07, 2004, for a revision of her left knee replacement, by Dr.  [name].  She will undergo all the routine preoperative  laboratory tests and studies prior to this procedure.  If we have any  medical issues while she is hospitalized, we will consult Dr. Pete Glatter.       ________________________________________  Legrand Pitts. Duffy, P.A.   ___________________________________________  Lenard Galloway Chaney Malling, M.D.    KED/MEDQ  D:  02/28/2004  T:  02/28/2004  Job:  [id number]

## 2010-08-04 NOTE — Discharge Summary (Signed)
[name], [name] NO.:  [id number]   MEDICAL RECORD NO.:  [id number]          PATIENT TYPE:  ORB   LOCATION:  4532                         FACILITY:  [facility]   PHYSICIAN:  [name], M.D.   DATE OF BIRTH:  1921-02-08   DATE OF ADMISSION:  03/10/2004  DATE OF DISCHARGE:                                 DISCHARGE SUMMARY   DATE OF DISCHARGE:  Date of discharge is anticipated for March 18, 2004.   DISCHARGE DIAGNOSES:  1.  Left total knee replacement revision secondary to wear.  2.  Coronary artery disease.  3.  History of pulmonary embolus.  4.  Sacral decubitus ulcer.  5.  Postoperative anemia, improved.   HISTORY OF PRESENT ILLNESS:  [name] is an 77 year old female with  history of coronary artery disease, bilateral total knee replacement, left  total knee replacement in 1989 with pain over the past year and problems  with ambulation, taken to operating room on March 07, 2004 by [name]  for synovectomy left knee and exchange of left tibial bearing  insert secondary to wear. Postoperative the patient was weight-bearing as  tolerated and Coumadin was resumed.  She required transfusion with 2 units  of packed red blood cells for postoperative anemia.  Therapy was initiated  and patient is currently __________  with moderate assist for bed to chair  transfers and has ambulated 5 feet with moderate assist.   PAST MEDICAL HISTORY:  Significant for hypertension, gastroesophageal reflux  disease, coronary artery disease with stent in November 2004, diverticulitis  with recent colon resection in October 2005, pseudogout, degenerative disc  disease with lumbar stenosis, history of PE in September 2005, history of  hysterectomy, left shoulder surgery requiring replacement, peptic ulcer  disease, history of right rotator cuff repair, osteoporosis, right total  knee replacement with revision and sacral decubitus ulcer since November   2005.   ALLERGIES:  MOTRIN.   SOCIAL HISTORY:  Patient is a resident of [facility].  Has had some assistance with activities of daily living and house cleaning  prior to admission.  She does not use any alcohol or tobacco.   HOSPITAL COURSE:  [name] was admitted to [facility] on March 10, 2004 for  therapies to consist of physical therapy daily.  After admission she was  maintained on Coumadin and Lovenox until Coumadin was at therapeutic basis.  Blood pressures were monitored on a b.i.d. basis and they were reasonably  controlled on Toprol XL.  Oral intake has been good. The patient had been  continent of bowel and bladder, has had some problems with constipation  secondary to narcotics.  This has been treated with use of laxatives. The  patient had postoperative anemia and last check on March 16, 2004 shows  hemoglobin 11.1, hematocrit 44.6, white count 5.6, platelet count 418,000.  Check of lytes revealed sodium 139, potassium 3.4, chloride 99, cO2 31, BUN  8, creatinine 0.7, glucose 96. Liver function tests show low protein with  total protein at 5.6, albumin 2.3.   The patient's wound incision had  been healing well without any signs or  symptoms of infection with no drainage or erythema noted.  Staples  discontinued and Steri-Strips placed on postoperative day #10 without  difficulty.  The patient continues on Coumadin with Pro Time and INR on  March 17, 2004 being at 19.1 and 2.1.  The patient is to resume her 2 mg  alternating with 3 mg every other day of Coumadin dosage with next pro time  to be checked on March 21, 2004 with results to Dr. Laverle Hobby office.  During her stay in the subacute care unit the patient's progressed to being  at modified independent levels for upper her body care.  She was supervision  without assistive device for low body care, minimal assist for __________.  She is modified independent for toileting. She is modified  independent for  ambulating 50 to 75 feet __________ .  Further follow up therapies to  include home health physical therapy and occupational therapy by [facility] following discharge.  On March 18, 2004 the patient  is discharged to home.   DISCHARGE MEDICATIONS:  1.  Coumadin 2 mg alternating with 3 mg every other day.  2.  Toprol XL 100 mg per day.  3.  Vytorin 40/10 once per day.  4.  Prevacid 15 mg per day.  5.  Miacalcin one spray daily.  6.  Calcium supplements on t.i.d. basis.  7.  Percocet one to two q.i.d. p.r.n. pain.   ACTIVITY:  Use walker.   DIET:  Regular.   WOUND CARE:  Wash with soap and water, keep clean and dry.   SPECIAL DISCHARGE INSTRUCTIONS:  No alcohol, no smoking, no driving.  [facility] to provide physical therapy and occupational therapy  R.N.  The patient's next pro time will be checked on March 21, 2004 with  results to [name].   FOLLOW UP:  Patient is to follow up with [name] for an  appointment in two to three weeks.  Follow up with [name] for her  routine checks. Follow up with [name] as needed.       PP/MEDQ  D:  03/17/2004  T:  03/17/2004  Job:  [id number]   cc:   [name], M.D.  [address]  [place], [place] [address]  Fax: [phone]   [name], M.D.  [address] [place]  [place] [address]  Fax: [phone]

## 2010-08-04 NOTE — H&P (Signed)
[facility]  Patient:    [name], [name] Visit Number: [id number] MRN: [id number]          Service Type: Attending:  Ollen Gross, M.D. Dictated by:   Ottie Glazier. Wynona Neat, P.A.-C. Adm. Date:  12/02/00                           History and Physical  CHIEF COMPLAINT:  Right shoulder pain.  HISTORY OF PRESENT ILLNESS:  [name] is a 77 year old white female with a history of right shoulder pain occurring when she fell this past May and landed directly on her right side.  Since that time, the patient has noticed increasing pain with movement as well as weakness, especially with external rotation.  The patient states this came to the point where it is interfering with her daily activities.  Therefore, after a lengthy discussion with Dr. [name], the patient has decided to undergo definitive surgical treatment.  ALLERGIES:  No known drug allergies.  MEDICATIONS: 1. Protonix 1 p.o. q.d. 2. Mobic 15 mg 1 p.o. q.d. 3. Hydrochlorothiazide, unsure of dosage. 4. Glucosamine 1 a day. 5. Centrum Silver multivitamin 1 a day.  PAST MEDICAL HISTORY: 1. Osteoarthritis. 2. GERD. 3. Hypertension. 4. History of hepatitis B. 5. History of sleep apnea.  PAST SURGICAL HISTORY:  The patient had a total vaginal hysterectomy in 1980. She had bilateral knee replacement with a subsequent right knee repair.  She has also had a left shoulder replacement.  Note should be made that she developed an aspiration pneumonitis secondary to GERD during surgery and therefore was placed on the Protonix.  She has also had a history of a tonsillectomy.  SOCIAL HISTORY:  The patient is a widow with five children, two of whom are deceased.  She denies any tobacco abuse.  She does have an occasional glass of wine.  She lives in a nursing facility at this time.  REVIEW OF SYSTEMS:  General: The patient denies any type of night sweats, weight loss, recent loss of appetite.  HEENT:  She  does wear corrective lenses.  She does have dentures.  She denies any chronic headaches, ringing of the ears, runny nose, or chronic sore throat.  She denies any problems with her thyroid.  She denies any problems with a chronic cough, productive cough, or hemoptysis.  Cardiovascular: She states she will have occasional shortness of breath; this is mainly with exertional activities and has no chest pain or irregular heart beats with this.  GI/GU: The patient denies nay history of chronic diarrhea, constipation, melena, bright red blood per rectum, nocturia, frequency, dysuria.  Extremities: Please see HPI.  Neurologic:  The patient denies any history of seizures, strokes, paralysis, paresthesias, numbness, or tingling.  PHYSICAL EXAMINATION:  VITAL SIGNS:  Blood pressure 140/90, respirations 12, pulse 70.  GENERAL:  This is a very pleasant, talkative, 77 year old white female appearing her stated age in no acute distress.  HEENT:  Head is atraumatic, normocephalic.  NECK:  Supple without masses or carotid bruits.  CHEST:  Clear to auscultation.  BREASTS:  Examination deferred.  HEART:  Regular rate and rhythm, S1, S2.  ABDOMEN:  Soft, nontender.  Bowel sounds are positive.  No guarding, no rebound.  GENITOURINARY:  Deferred.  EXTREMITIES:  She has active elevation to less than 120, abduction 90, external rotation about 20 with slight crepitus.  Her strength is approximately 4/5 of right arm and approximately 3/5 with external rotation. She  does have tenderness over the [facility] joint.  LABORATORY DATA:   X-ray shows a full-thickness tear of supraspinatus and with retraction of the subscapularis and infraspinatus with likely partial tears of those.  IMPRESSION:  Right rotator cuff tear.  PLAN:  The patient will be admitted to [facility] and undergo a right shoulder open subacromial decompression with ______ resection and right rotator cuff repair per Dr. [name].  All  questions were encouraged and entertained appropriately.  The patient has received medical clearance from Dr. Merlene Laughter stating that there are no active cardiovascular problems, and there should be no contraindication to shoulder surgery. Dictated by:   Ottie Glazier. Wynona Neat, P.A.-C. Attending:  Ollen Gross, M.D. DD:  12/02/00 TD:  12/02/00 Job: [id number]

## 2010-08-04 NOTE — Consult Note (Signed)
Christine, Henry NO.:  [id number]   MEDICAL RECORD NO.:  [id number]          PATIENT TYPE:  INP   LOCATION:  2018                         FACILITY:  [facility]   PHYSICIAN:  [name], M.D.    DATE OF BIRTH:  1920-06-14   DATE OF CONSULTATION:  04/24/2005  DATE OF DISCHARGE:                                   CONSULTATION   REFERRING PHYSICIAN:  Dr. Merlene Laughter.   INDICATION FOR CONSULTATION:  Increasing dyspnea.   HISTORY:  [name] is a very pleasant 77 year old female who has been  residing at [facility].  She has been having increasing shortness  of breath for the past 4-5 days.  She has also had increase in temperature  with temperature noted to be 100.1; she was also found to be tachycardic.  She presented to the [facility] office, where a chest x-ray, CPK and  troponin were performed and these were found to be negative.  Due to her  history of pulmonary embolus, a CT scan of the chest was also performed  which showed nothing acute.  This morning, her daughter called stating that  her shortness of breath had worsened and she was on O2.  She presented back  to the office and was found to have a new left bundle branch block.  Her  echocardiogram reportedly shows mild-to-moderate decrease in LV function  with an ejection fraction of 40%.  Again, her troponin I was negative.  The  patient had a nonproductive cough.  She has noted increasing wheezing.  She  had a history of peripheral edema approximately 3 weeks prior to this  presentation, but no significant increase in shortness of breath at that  time.  She has noted decrease in exercise tolerance in the past year.  She  feels that she did not recover from her recent surgery.  Her activity is  limited by her lower back pain.   PAST MEDICAL HISTORY:  Her past medical history is significant for:  1.  Coronary artery disease, status post stent deployment in 2004.  2.  Diverticulitis.  3.   Hypertension.  4.  Pulmonary embolus in 2005.  5.  Dyslipidemia.  6.  GERD.  7.  Osteoporosis.  8.  Spinal stenosis, status post L2-L3 laminectomy.  9.  Peptic ulcer disease.   PAST SURGICAL HISTORY:  Past surgical history is significant for:  1.  Vaginal hysterectomy in 1980.  2.  Total knee replacement in 2002.  3.  Colon resection secondary to diverticulitis in 2005.  4.  Left shoulder surgery in 2005.   MEDICATION AT TIME OF PRESENTATION:  1.  Prevacid daily.  2.  Miacalcin nasal spray.  3.  Vytorin 10/40.  4.  Toprol-XL 100 mg daily.  5.  Detrol LA 4 mg daily.   ALLERGIES:  NEOSPORIN, __________ and [name]  She is intolerant to  PROTONIX.   SOCIAL HISTORY:  She lives at [facility].  She is widowed.  There is no  history of alcohol or tobacco use, no illicit drug use.   FAMILY HISTORY:  Family history  is significant for coronary artery disease;  father had myocardial infarction at age 65, one sister with myocardial  infarction.   PHYSICAL EXAMINATION:  GENERAL:  Physical exam reveals an elderly female in  mild respiratory distress.  VITAL SIGNS:  T-max is 100.4, blood pressure is 104/71, heart rate is 105.  Her O2 SAT is 88% on room air; O2 SAT on 2 L was 93%.  HEENT:  Unremarkable.  NECK:  She has good carotid upstrokes, no carotid bruits.  No neck vein  distention is noted.  PULMONARY:  Exam reveals diffuse expiratory wheezing.  Breath sounds are  equal.  CARDIOVASCULAR:  Exam reveals a regular rate and rhythm.  Heart tones are  distant.  ABDOMEN:  Nontender.  Normal bowel sounds.  EXTREMITIES:  Extremities reveal no pericardial effusion.  NEUROLOGIC:  Nonfocal.  SKIN:  Warm to touch.   IMAGING STUDIES:  Spiral CT of the chest on April 23, 2005 revealed no  evidence of acute PE.  She was noted to have a hiatal hernia.   A transthoracic echocardiogram was performed in the office and by report,  she has mild-to-moderate LV dysfunction; however, the echo is  suboptimal.  She had normal LV dimension, unable to obtain an RV systolic pressure.  She  was also noted to have mild aortic sclerosis.   LABORATORY DATA:  Her hematocrit is 36.3, hemoglobin was 12.3, white count  was normal, platelet count was 212,000.  BUN was 13, creatinine was 1,  sodium was 133, potassium was 3.8, chloride was 97, CO2 is 26, albumin is  3.5.  She has normal liver enzymes.  BNP is slightly elevated at 223.  TSH  is 5.96.  Sedimentation rate is 60.   IMPRESSION:  1.  An elderly female with increasing shortness of breath associated with      cough and wheezing.  She does have a slightly elevated BNP.  At this      time, I would treat for an upper respiratory infection.  I agree with      your current plan of therapy and ordering blood cultures x2.  Her home      medications have been resumed.  She has been ordered Xopenex nebulizer      therapy and has been started on Avelox.  If there is no significant      improvement in her dyspnea with this approach, may consider diuresis;      however, her history is more consistent with that upper respiratory      infection.  I will review the echocardiogram myself.  I would not      proceed with further cardiac workup at this time, unless the patient is      demonstrated to have ischemia by troponins or her cardiac enzymes.  2.  New left bundle branch block.  This may be intermittent.  We will      continue to monitor.  3.  Sleep apnea.  The patient should resume her treatment for sleep apnea.      [name], M.D.  Electronically Signed    HP/MEDQ  D:  04/24/2005  T:  04/25/2005  Job:  [id number]

## 2010-08-04 NOTE — Op Note (Signed)
[name], [name]                ACCOUNT NO.:  [id number]   MEDICAL RECORD NO.:  [id number]          PATIENT TYPE:  INP   LOCATION:  0462                         FACILITY:  [facility]   PHYSICIAN:  Jimmye Norman III, M.D.  DATE OF BIRTH:  16-Apr-1920   DATE OF PROCEDURE:  01/14/2004  DATE OF DISCHARGE:                                 OPERATIVE REPORT   PREOPERATIVE DIAGNOSIS:  Acute diverticulitis.   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   PROCEDURE:  1.  Sigmoid colectomy with primary anastomosis.  2.  Partial enterectomy.   SURGEON:  Jimmye Norman, M.D.   ASSISTANT:  Dr. Daphine Deutscher.   ANESTHESIA:  General endotracheal.   ESTIMATED BLOOD LOSS:  75-100 cc.   COMPLICATIONS:  None.   CONDITION:  Good.   INDICATIONS FOR PROCEDURE:  Patient is an 77 year old female with severe  diverticulitis, who comes in now for an elective colectomy after an  outpatient bowel prep.   FINDINGS:  The patient had a significant area of likely walled-up,  perforated diverticulitis in the mid sigmoid colon.  Attached to it was a  loop of ileum, which was partially resected.  There was the possibility of  an enteral colonic fistula.  The rest of the anatomy appeared to be fine,  including the gallbladder, liver and spleen, which all appeared to be  normal.   OPERATION:  The patient was taken to the operating room and placed on the  table in a supine position.  After an adequate endotracheal anesthetic was  administered, she was prepped and draped in the usual sterile manner,  exposing the midline.   A midline incision was made between the pubic crest and also just above the  umbilicus.  It was taken down to and through the midline fascia using  electrocautery.  We opened the peritoneum using Metzenbaum scissors while  tenting up with two forceps.  Once we were in the peritoneal cavity, we  opened the incision for the full length of the skin incision and  subsequently put a Balfour retractor with the patient in Trendelenburg  position to isolate the area of concern.   In the pelvis, there was an inflammatory mass which was adhesed to the  surrounding structures.  The most significantly adhered portion was a loop  of small bowel in the proximal ileum, which appeared to possibly have either  a walled-off abscessed cavity or a fistula.  The decision was made to do a  partial small bowel resection, at that point, not opening up the abscessed  cavity, and taking en bloc with the sigmoid colon.   We found a point proximally of the colon where it could be mobilized and  transected with a GIA-55 stapler.  We had to mobilize the colon along the  line of Toldt and free it up for the anastomosis; however, we transected it  and subsequently took its mesentery down to a point of the superior and  middle hemorrhoidal vessels.  Sigmoidal vessels were also taken with 2-0  silk ties.  We took care to stay very  close to the colon and away from the  retroperitoneal structures where the ureter could have been injured.   We transected and took the mesentery down to the distal sigmoid colon and  proximal rectum, where we transected it with a contour cutting stapler.  We  cleaned off the back wall of the colon using right angle clamps, 2-0 silk  ties, and also cautery.  This left [place] with a sort of proximal rectal stump,  which was used for our hand-sewn anastomosis.   We did a hand-sewn two-layer linear anastomosis between the distal  descending colon or proximal sigmoid and the proximal rectum using a back-  wall stitch of 2-0 silk pop-off sutures in a Lembert manner.  We opened the  proximal and distal bowel prior to doing the anastomosis, putting a spring  clamp on the proximal colon to control drainage.  There was significantly  more distal wall to sew to; therefore, we made up some space and also split  the anterior wall of the colon proximally prior to  the anastomosis.  We did  a back wall of Lembert stitches of 2-0 silk, then a full-thickness mucosa  and full-thickness wall of 3-0 Vicryl, bringing it anteriorly with the  Connell stitch.  Once this was done, we closed the anterior wall using 3-0  silk pop-off sutures.  These did fine.  We did not have to sew down the  mesentery because this anastomosis laid down right along the contour of the  pelvis very easily without evidence of any tension.   The small bowel ends that had been transected were reapproximated using a  GIA-55 stapler, and then the resulting enterotomy was closed with a TA 60  stapler with 3.5 mm closure stapler.  A single figure-of-eight stitch was  used to close the mesentery.  We ran the small bowel from the ligament of  Treitz down to the terminal ileum, and it was free of any twists, kinks, or  any other significant problems.   We irrigated with saline solution, about a liter was used, then we close the  abdomen using a running #1 PDS suture.  The skin was closed using stainless  steel staples.  All needle counts, sponge counts, and instrument counts were  correct.  A sterile dressing was applied.      JW/MEDQ  D:  01/14/2004  T:  01/14/2004  Job:  [id number]

## 2010-08-04 NOTE — Consult Note (Signed)
NAMEPRERNA, HAROLD                          ACCOUNT NO.:  [id number]   MEDICAL RECORD NO.:  [id number]                   PATIENT TYPE:  INP   LOCATION:  5502                                 FACILITY:  [facility]   PHYSICIAN:  [name], M.D.                 DATE OF BIRTH:  December 15, 1920   DATE OF CONSULTATION:  01/18/2003  DATE OF DISCHARGE:                                   CONSULTATION   INDICATION FOR CONSULT:  Chest pain.   HISTORY OF PRESENT ILLNESS:  [name] is a very pleasant 77 year old  female who presented to the emergency room with complaints of chest pain.  The pain was described as a midsternal chest pain and was aggravated by  inspiration.  This is the first episode of chest pain.  She has had no  further chest pain since hospital admission.  On further review of systems  the patient has had a history of significant fatigue which she felt was  disproportionate to her level of health.  She has also noted dyspnea with  exertion.  The chest pain actually awoke her from sleep.  She continues to  feel that the pain itself is pleuritic in nature.  She has had no nausea,  vomiting or diaphoresis.  She presents to the ER with pain that was relieved  with Morphine sulfate.   PAST MEDICAL HISTORY:  1. Significant for calcium pyrophosphate disease.  2. Hypertension.  3. GERD.  4. Sleep apnea.  5. Osteoarthritis.  6. Hepatitis B.   OUTPATIENT MEDICATIONS:  1. Multivitamins.  2. Prevacid 30 mg daily.  3. Miacalcin nasal spray.  4. Prednisone 10 mg daily.  5. Norvasc 5 mg daily.  6. Tums q.i.d.   ALLERGIES:  IBUPROFEN.   FAMILY HISTORY:  Significant for coronary artery disease.   SOCIAL HISTORY:  She lives at [facility] and is a widow.  Mother of five.  No tobacco or alcohol use.   REVIEW OF SYSTEMS:  No paroxysmal nocturnal dyspnea, no fever.  No GI  complaints or melena.  She has had bilateral hand swelling but no lower  extremity swelling.  She has had no  presyncope or syncope.  She has had no  orthopnea.   PAST SURGICAL HISTORY:  Significant for vaginal hysterectomy in 1980.  Bilateral total knee replacements, left shoulder and right shoulder surgery  in September of 2002.   PHYSICAL EXAMINATION:  VITAL SIGNS:  Blood pressure is 100/57, heart rate is  100, respiratory rate is 20.  She is afebrile.  HEENT:  Unremarkable.  NECK:  There is no neck vein distention noted.  She has good carotid  upstrokes.  PULMONARY:  Exam reveals breath sounds which are equal and clear to  auscultation.  No use of accessory muscles.  CARDIOVASCULAR:  Regular rate and rhythm with no rubs, murmurs or gallops  noted.  There is slight tenderness to  palpation.  The PMI is slightly displaced  laterally.  ABDOMEN:  Soft, benign, nontender, no unusual bruits or pulsations are  noted.  EXTREMITIES:  Reveal no peripheral edema.  She is noted to have significant  vein varicosities.  Distal pulses are weak but palpable.   LABORATORY DATA:  Her pH was 7.4, PCO2 was 42, bicarb was 28.  White count  is 12,400, hemoglobin 14.0, hematocrit is 40, platelet count is 256,000.  INR is 0.8.  Potassium 4.5, creatinine 0.9.  CK is 35, MB is 1.6, troponin I is 0.5, CK-MB is 1.6.  ECG reveals a sinus  tachycardia, with nonspecific ST changes.  Stress-Cardiolite was performed,  there was no inducible ischemia by ECG criteria.  The patient was noted to  have an inferior basilar defect with minimal reversal.  Her ejection  fraction was significantly decreased at 33%.   IMPRESSION:  1. Cardiomyopathy most likely non ischemic.  Risk benefits and options were     discussed with the patient.  Will proceed with left heart catheterization     for further work up, in the interim following her catheterization her     Norvasc will be discontinued and she will be started on Altace.  At this     point I will add Coreg 3.125 mg b.i.d. to her regimen.  She has been     started on aspirin at  81 mg daily.  Further recommendations pending the     outcome of her cardiac catheterization.  2. Hypertension.  Blood pressure currently is well controlled but as noted     above will switch to Altace following her cardiac catheterization.  3. Sleep apnea.  This has been well controlled.  4. Health Maintenance.  Will obtain a statin profile in the a.m.   The above findings were discussed with both the patient and her daughter.                                               [name], M.D.    HP/MEDQ  D:  01/18/2003  T:  01/19/2003  Job:  [id number]

## 2010-09-13 ENCOUNTER — Emergency Department (HOSPITAL_COMMUNITY): Payer: Medicare Other

## 2010-09-13 ENCOUNTER — Observation Stay (HOSPITAL_COMMUNITY)
Admission: EM | Admit: 2010-09-13 | Discharge: 2010-09-14 | DRG: 315 | Disposition: A | Discharge status: Home / Self Care | Payer: Medicare Other | Attending: Internal Medicine | Admitting: Internal Medicine

## 2010-09-13 DIAGNOSIS — I428 Other cardiomyopathies: Secondary | ICD-10-CM | POA: Diagnosis present

## 2010-09-13 DIAGNOSIS — E785 Hyperlipidemia, unspecified: Secondary | ICD-10-CM | POA: Diagnosis present

## 2010-09-13 DIAGNOSIS — Y831 Surgical operation with implant of artificial internal device as the cause of abnormal reaction of the patient, or of later complication, without mention of misadventure at the time of the procedure: Secondary | ICD-10-CM | POA: Insufficient documentation

## 2010-09-13 DIAGNOSIS — T82198A Other mechanical complication of other cardiac electronic device, initial encounter: Principal | ICD-10-CM | POA: Insufficient documentation

## 2010-09-13 DIAGNOSIS — I2510 Atherosclerotic heart disease of native coronary artery without angina pectoris: Secondary | ICD-10-CM | POA: Diagnosis present

## 2010-09-13 DIAGNOSIS — I447 Left bundle-branch block, unspecified: Secondary | ICD-10-CM | POA: Diagnosis present

## 2010-09-13 DIAGNOSIS — Z86711 Personal history of pulmonary embolism: Secondary | ICD-10-CM | POA: Insufficient documentation

## 2010-09-13 DIAGNOSIS — Z79899 Other long term (current) drug therapy: Secondary | ICD-10-CM | POA: Insufficient documentation

## 2010-09-13 DIAGNOSIS — Z96659 Presence of unspecified artificial knee joint: Secondary | ICD-10-CM | POA: Insufficient documentation

## 2010-09-13 DIAGNOSIS — M4800 Spinal stenosis, site unspecified: Secondary | ICD-10-CM | POA: Diagnosis present

## 2010-09-13 DIAGNOSIS — K219 Gastro-esophageal reflux disease without esophagitis: Secondary | ICD-10-CM | POA: Insufficient documentation

## 2010-09-13 DIAGNOSIS — I10 Essential (primary) hypertension: Secondary | ICD-10-CM | POA: Diagnosis present

## 2010-09-13 DIAGNOSIS — Z7982 Long term (current) use of aspirin: Secondary | ICD-10-CM | POA: Insufficient documentation

## 2010-09-13 LAB — DIFFERENTIAL
Lymphocytes Relative: 14 % (ref 12–46)
Monocytes Absolute: 0.6 10*3/uL (ref 0.1–1.0)
Monocytes Relative: 7 % (ref 3–12)
Neutro Abs: 6.2 10*3/uL (ref 1.7–7.7)

## 2010-09-13 LAB — URINALYSIS, MICROSCOPIC ONLY
Hgb urine dipstick: NEGATIVE
Leukocytes, UA: NEGATIVE
Nitrite: NEGATIVE
Specific Gravity, Urine: 1.012 (ref 1.005–1.030)
Urobilinogen, UA: 0.2 mg/dL (ref 0.0–1.0)

## 2010-09-13 LAB — COMPREHENSIVE METABOLIC PANEL
Alkaline Phosphatase: 96 U/L (ref 39–117)
BUN: 13 mg/dL (ref 6–23)
CO2: 27 mEq/L (ref 19–32)
Calcium: 8.8 mg/dL (ref 8.4–10.5)
GFR calc Af Amer: >60 mL/min (ref >60)
GFR calc non Af Amer: >60 mL/min (ref >60)
Glucose, Bld: 102 mg/dL — ABNORMAL HIGH (ref 70–99)
Potassium: 3.5 mEq/L (ref 3.5–5.1)
Total Protein: 6.5 g/dL (ref 6.0–8.3)

## 2010-09-13 LAB — CBC
HCT: 37.9 % (ref 36.0–46.0)
Hemoglobin: 12.7 g/dL (ref 12.0–15.0)
MCH: 30.8 pg (ref 26.0–34.0)
MCHC: 33.5 g/dL (ref 30.0–36.0)

## 2010-09-13 LAB — MRSA PCR SCREENING: MRSA by PCR: NEGATIVE

## 2010-09-13 LAB — TROPONIN I: Troponin I: 1.82 ng/mL (ref <0.30)

## 2010-09-13 LAB — TSH: TSH: 1.241 u[IU]/mL (ref 0.350–4.500)

## 2010-09-13 LAB — CK TOTAL AND CKMB (NOT AT ARMC): Total CK: 116 U/L (ref 7–177)

## 2010-09-21 NOTE — Discharge Summary (Addendum)
[name], [name] NO.:  [id number]  MEDICAL RECORD NO.:  [id number]  LOCATION:  2040                         FACILITY:  [facility]  PHYSICIAN:  Doylene Canning. Ladona Ridgel, MD    DATE OF BIRTH:  1921-03-19  DATE OF ADMISSION:  09/13/2010 DATE OF DISCHARGE:  09/14/2010                              DISCHARGE SUMMARY   DISCHARGE DIAGNOSES: 1. Inappropriate ICD shocks x2, with noise on the rate sense portion     of her pacing defibrillator lead.     a.     Reprogrammed to DOO at 80.     b.     Brady-tachy therapy/shocked portion turned off this      admission. 2. Nonischemic cardiomyopathy with EF 45% to 50% in 2009.     a.     Status post CRPD implant in 2007 by Dr. Amil Amen.         I. Has a Medronic 6949 lead in place. 3. Coronary artery disease.  Status post stenting to the second OM     circumflex in 2004. 4. Pulmonary embolism in 2005. 5. Hypertension. 6. Dyslipidemia. 7. Spinal stenosis.  SURGICAL HISTORY:  Right shoulder surgery and left shoulder surgery, hysterectomy, bilateral knee replacement, and cataract surgery.  HOSPITAL COURSE:  [name] is an 77 year old female with a history of nonischemic cardiomyopathy, left bundle-branch block, and coronary artery disease who underwent BiV insertion by Dr. Amil Amen in 2007.  At that time, her heart failure improved markedly, but unfortunately she had a Medronic 6949 lead placed.  She has done well until she was admitted with recurrent ICD shocks yesterday.  She did not have any syncope or palpitations.  It was noted on interrogation defibrillator that there was noise on the right center portion of the pacing lead, portion of her lead.  The patient's threshold was normal and impendence was stable.  She denied any overt heart failure symptoms.  The device was initially reprogrammed by turning off the shocking portion as she has had no VT and did not have a clear cut primary indication for ICD implantation.  She was  observed on telemetry.  Initially, the pacemaker was reprogrammed to DDI 60 beats per minute with a programmed RV sensitivity yesterday.  Today, per discussion with Christine Hammed, RN, the device has been reprogrammed to DOO at 80.  It is discussed that if the patient should have recurrent heart failure symptoms, she may require a new lead, but we will followed closely as an outpatient for now.  Dr. Ladona Ridgel has seen and examined her today and felt she is stable for discharge.  DISCHARGE LABS:  WBC 8.1, hemoglobin 12.7, hematocrit 37.9, platelet count 161.  Sodium 137, potassium 3.5, chloride 100, CO2 of 27, glucose 102, BUN 13, creatinine 0.67, LFT's within normal limits.  UA showed few squamous epithelial cells, otherwise negative.  STUDIES:  Chest x-ray shows stable cardiomegaly without pulmonary edema. Stable scarring in the right upper lobe and middle lobe.  Stable large hiatal hernia.  No acute cardiopulmonary disease.  DISCHARGE MEDICATIONS: 1. Aspirin 325 mg daily. 2. Citracal 600 mg 2 tablets daily. 3. Cymbalta 30 mg daily. 4. Detrol LA 4  mg daily at bedtime. 5. Multivitamin 1 tablet daily. 6. Nitro sublingual 0.4 mg every 5 minutes as needed up to 3 dose for     chest pain. 7. Prevacid 15 mg daily as needed. 8. Poly Iron 150 mg b.i.d. 9. Torsemide 10 mg daily. 10.Vicodin 5/500 mg every morning as needed for pain. 11.Vytorin 10/80 mg daily.  DISPOSITION:  [name] will be discharged in stable condition to home.  She is to follow a low-sodium heart healthy diet and is to increase activity slowly.  She will be followed by Dr. Ladona Ridgel on October 10, 2010 at 2 p.m.  Duration of discharge encounter greater than 30 minutes including physician and PA time.     [name], P.A.C.   ______________________________ Doylene Canning. Ladona Ridgel, MD    DD/MEDQ  D:  09/14/2010  T:  09/14/2010  Job:  [id number]  cc:   Corky Crafts, MD  Electronically Signed by Ronie Spies  on  09/21/2010 01:35:51 PM Electronically Signed by Lewayne Bunting MD on 10/05/2010 08:32:56 AM

## 2010-10-05 NOTE — Consult Note (Signed)
[name], [name] NO.:  [id number]  MEDICAL RECORD NO.:  [id number]  LOCATION:  2040                         FACILITY:  [facility]  PHYSICIAN:  Doylene Canning. Ladona Ridgel, MD    DATE OF BIRTH:  10-01-1920  DATE OF CONSULTATION:  09/13/2010 DATE OF DISCHARGE:                                CONSULTATION   INDICATION FOR ADMISSION:  Multiple ICD discharges.  HISTORY OF PRESENT ILLNESS:  The patient is a very pleasant 77 year old woman with a nonischemic cardiomyopathy, left bundle-branch block, who underwent Bi-V ICD insertion by Dr. Amil Amen several years ago.  At that time, her heart failure improved markedly.  Unfortunately, she had a 6949 Medtronic lead placed.  It had gotten very nicely until today when she was admitted with recurrent ICD shocks.  She states that she was not doing anything particularly when these episodes occurred and it felt like she was being struck by lightening.  The patient did not have syncope or palpitations.  She is admitted for additional evaluation. Subsequent interrogation of her defibrillator has been carried out and reviewed by myself.  It demonstrates noise on the rate sense portion of the pacing lead, portion of her lead.  The patient is admitted for additional evaluation.  Of note the patient's threshold are normal and the impedances are stable.  The patient denies chest pain.  She does note that with insertion of her biventricular device and specifically with Bi-V pacing, her heart failure symptoms improved.  PAST MEDICAL HISTORY:  Notable for longstanding nonischemic cardiomyopathy with near normalization of her ejection fraction after her device was placed.  She has coronary artery disease status post stenting.  She has a history of pulmonary embolism in 2005.  She has a history of hypertension, dyslipidemia, and spinal stenosis.  PAST SURGICAL HISTORY:  Notable for shoulder repair, bilateral knee replacements, and left shoulder  replacement.  SOCIAL HISTORY:  She is widowed.  She lives in [place].  She denies alcohol or tobacco abuse.  FAMILY HISTORY:  Notable for sister who died of coronary disease and her father died of "old age.".  REVIEW OF SYSTEMS:  All systems were reviewed and negative except as noted in the HPI with the exception that she does occasionally have some problems with peripheral edema and dyspnea.  She admits to sodium indiscretion.  PHYSICAL EXAMINATION:  GENERAL:  She is a pleasant elderly woman who looks otherwise well. VITAL SIGNS:  Blood pressure was 127/88, the pulse was 70 and regular, respirations were 18, temperature 98. HEENT:  Normocephalic and atraumatic.  Pupils equal and round. Oropharynx moist.  Sclerae anicteric. NECK:  Revealed no jugular distention.  There is no thyromegaly. LUNGS:  Clear bilaterally to auscultation.  No wheezes, rales, or rhonchi are present.  No increased work of breathing. CARDIOVASCULAR:  Revealed a regular rate and rhythm with normal S1 and S2.  The PMI was not enlarged or laterally displaced. ABDOMEN:  Soft, nontender, nondistended.  There is no organomegaly. Bowel sounds are present.  There is no rebound or guarding. EXTREMITIES:  Demonstrated no cyanosis, clubbing, or edema.  Pulses are 2+ symmetric.  Her ICD incision was well-healed.  Interrogation of her  defibrillator was carried out which demonstrated significant lead noise on the rate sense portion of her pacing defibrillator lead that also demonstrates normal P-waves and pacing impedances.  IMPRESSION: 1. ICD lead dysfunction with noise on the rate sense portion of her     pacing defibrillator lead. 2. Nonischemic cardiomyopathy. 3. Chronic systolic heart failure class I-II.  DISCUSSION:  I have discussed treatment options with the patient and her daughter who is with her today.  We will try initially to reprogram her device by turning the shocking portion of her device off as  she has had no VT and does not have a clear-cut primary indication for ICD implantation.  We will also observe the patient on telemetry.  She may have trouble with inappropriate pacing and if this is the case, lead revision would be considered.  We plan to follow the patient as an outpatient as well.     Doylene Canning. Ladona Ridgel, MD     GWT/MEDQ  D:  09/13/2010  T:  09/14/2010  Job:  [id number]  cc:   Hal T. Stoneking, M.D.  Electronically Signed by Lewayne Bunting MD on 10/05/2010 08:32:59 AM

## 2010-10-10 ENCOUNTER — Ambulatory Visit (INDEPENDENT_AMBULATORY_CARE_PROVIDER_SITE_OTHER): Payer: Medicare Other | Admitting: Internal Medicine

## 2010-10-10 ENCOUNTER — Encounter: Payer: Self-pay | Admitting: Internal Medicine

## 2010-10-10 DIAGNOSIS — I509 Heart failure, unspecified: Secondary | ICD-10-CM

## 2010-10-10 DIAGNOSIS — Z950 Presence of cardiac pacemaker: Secondary | ICD-10-CM | POA: Insufficient documentation

## 2010-10-10 DIAGNOSIS — E785 Hyperlipidemia, unspecified: Secondary | ICD-10-CM | POA: Insufficient documentation

## 2010-10-10 DIAGNOSIS — Z95810 Presence of automatic (implantable) cardiac defibrillator: Secondary | ICD-10-CM

## 2010-10-10 DIAGNOSIS — I5022 Chronic systolic (congestive) heart failure: Secondary | ICD-10-CM | POA: Insufficient documentation

## 2010-10-10 DIAGNOSIS — I428 Other cardiomyopathies: Secondary | ICD-10-CM

## 2010-10-10 NOTE — Assessment & Plan Note (Signed)
Her device is not working normally but has been reprogrammed removing all tachycardia therapies. In addition she is not sensing. Despite this she feels stable. She denies worsening heart failure symptoms. When her device reaches elective replacement indication, we will discuss changing her device out for a bi ventricular pacemaker.

## 2010-10-10 NOTE — Assessment & Plan Note (Signed)
She will continue her statin medications and maintain a low-fat diet.

## 2010-10-10 NOTE — Progress Notes (Signed)
HPI [name] returns today for followup. She is a very pleasant 77 year old woman with a nonischemic cardiomyopathy, chronic systolic heart failure, status post IV ICD implantation. She developed ICD lead malfunction several months ago. At that time she received multiple ICD shocks secondary to noise on the rate sense portion of her device. Her ICD was turned off. The pacing function was left on that in the D00 mode. At that time the DDI mode resulted in oversensing and in addition a BIV pacing. She continues to do well. She does not note a difference in how she feels with reprogramming of her device. She denies chest pain, shortness of breath, or syncope. She has very minimal peripheral edema. No Known Allergies   Current Outpatient Prescriptions  Medication Sig Dispense Refill  . aspirin 325 MG tablet Take 325 mg by mouth daily.        . calcitonin, salmon, (MIACALCIN/FORTICAL) 200 UNIT/ACT nasal spray Place 1 spray into the nose daily.        . Calcium Citrate-Vitamin D (CITRACAL + D PO) Take 2 tablets by mouth daily.        . carvedilol (COREG) 6.25 MG tablet Take 1 tablet by mouth Twice daily.      . DULoxetine (CYMBALTA) 30 MG capsule Take 30 mg by mouth daily.        Marland Kitchen ezetimibe-simvastatin (VYTORIN) 10-80 MG per tablet Take 1 tablet by mouth at bedtime.        . lansoprazole (PREVACID) 15 MG capsule Take 15 mg by mouth daily.        . Multiple Vitamin (MULTIVITAMIN) capsule Take 1 capsule by mouth daily.        . Polysaccharide Iron Complex (POLY-IRON 150 PO) Take 1 tablet by mouth 2 (two) times daily.        Marland Kitchen tolterodine (DETROL LA) 4 MG 24 hr capsule Take 4 mg by mouth daily.        Marland Kitchen torsemide (DEMADEX) 10 MG tablet Take 10 mg by mouth daily.           No past medical history on file.  ROS:   All systems reviewed and negative except as noted in the HPI.   No past surgical history on file.   No family history on file.   History   Social History  . Marital Status:  Widowed    Spouse Name: N/A    Number of Children: N/A  . Years of Education: N/A   Occupational History  . Not on file.   Social History Main Topics  . Smoking status: Never Smoker   . Smokeless tobacco: Not on file  . Alcohol Use: Not on file  . Drug Use: Not on file  . Sexually Active: Not on file   Other Topics Concern  . Not on file   Social History Narrative  . No narrative on file     BP 117/71  Pulse 81  Resp 14  Wt 146 lb (66.225 kg)  Physical Exam:  Elderly but well appearing NAD HEENT: Unremarkable Neck:  No JVD, no thyromegally Lymphatics:  No adenopathy Back:  No CVA tenderness Lungs:  Clear HEART:  Regular rate rhythm, no murmurs, no rubs, no clicks Abd:  soft, positive bowel sounds, no organomegally, no rebound, no guarding Ext:  2 plus pulses, no edema, no cyanosis, no clubbing Skin:  No rashes no nodules Neuro:  CN II through XII intact, motor grossly intact  DEVICE  acceptable device function.  See PaceArt for details.   Assess/Plan:

## 2010-10-10 NOTE — Assessment & Plan Note (Signed)
Her symptoms remain class II. She'll continue her current medical therapy and maintain a low-sodium diet.

## 2010-10-10 NOTE — Patient Instructions (Signed)
Your physician wants you to follow-up in: 6 months with Dr Court Joy will receive a reminder letter in the mail two months in advance. If you don't receive a letter, please call our office to schedule the follow-up appointment.   Remote monitoring is used to monitor your Pacemaker of ICD from home. This monitoring reduces the number of office visits required to check your device to one time per year. It allows [place] to keep an eye on the functioning of your device to ensure it is working properly. You are scheduled for a device check from home on 01/11/11. You may send your transmission at any time that day. If you have a wireless device, the transmission will be sent automatically. After your physician reviews your transmission, you will receive a postcard with your next transmission date.

## 2011-01-06 ENCOUNTER — Emergency Department (HOSPITAL_COMMUNITY): Payer: Medicare Other

## 2011-01-06 ENCOUNTER — Emergency Department (HOSPITAL_COMMUNITY)
Admission: EM | Admit: 2011-01-06 | Discharge: 2011-01-06 | Disposition: A | Discharge status: Home / Self Care | Payer: Medicare Other | Attending: Emergency Medicine | Admitting: Emergency Medicine

## 2011-01-06 DIAGNOSIS — R0609 Other forms of dyspnea: Secondary | ICD-10-CM | POA: Insufficient documentation

## 2011-01-06 DIAGNOSIS — E7800 Pure hypercholesterolemia, unspecified: Secondary | ICD-10-CM | POA: Insufficient documentation

## 2011-01-06 DIAGNOSIS — Z86711 Personal history of pulmonary embolism: Secondary | ICD-10-CM | POA: Insufficient documentation

## 2011-01-06 DIAGNOSIS — R0989 Other specified symptoms and signs involving the circulatory and respiratory systems: Secondary | ICD-10-CM | POA: Insufficient documentation

## 2011-01-06 DIAGNOSIS — K219 Gastro-esophageal reflux disease without esophagitis: Secondary | ICD-10-CM | POA: Insufficient documentation

## 2011-01-06 DIAGNOSIS — R000 Tachycardia, unspecified: Secondary | ICD-10-CM | POA: Insufficient documentation

## 2011-01-06 DIAGNOSIS — I10 Essential (primary) hypertension: Secondary | ICD-10-CM | POA: Insufficient documentation

## 2011-01-06 DIAGNOSIS — M810 Age-related osteoporosis without current pathological fracture: Secondary | ICD-10-CM | POA: Insufficient documentation

## 2011-01-06 DIAGNOSIS — Z862 Personal history of diseases of the blood and blood-forming organs and certain disorders involving the immune mechanism: Secondary | ICD-10-CM | POA: Insufficient documentation

## 2011-01-06 DIAGNOSIS — I2510 Atherosclerotic heart disease of native coronary artery without angina pectoris: Secondary | ICD-10-CM | POA: Insufficient documentation

## 2011-01-06 DIAGNOSIS — Z95810 Presence of automatic (implantable) cardiac defibrillator: Secondary | ICD-10-CM | POA: Insufficient documentation

## 2011-01-06 DIAGNOSIS — Z8639 Personal history of other endocrine, nutritional and metabolic disease: Secondary | ICD-10-CM | POA: Insufficient documentation

## 2011-01-06 DIAGNOSIS — R072 Precordial pain: Secondary | ICD-10-CM | POA: Insufficient documentation

## 2011-01-06 DIAGNOSIS — Z79899 Other long term (current) drug therapy: Secondary | ICD-10-CM | POA: Insufficient documentation

## 2011-01-06 DIAGNOSIS — R0602 Shortness of breath: Secondary | ICD-10-CM | POA: Insufficient documentation

## 2011-01-06 DIAGNOSIS — Z7982 Long term (current) use of aspirin: Secondary | ICD-10-CM | POA: Insufficient documentation

## 2011-01-06 LAB — CBC
MCH: 31 pg (ref 26.0–34.0)
MCHC: 32.8 g/dL (ref 30.0–36.0)
MCV: 94.4 fL (ref 78.0–100.0)
Platelets: 165 10*3/uL (ref 150–400)

## 2011-01-06 LAB — DIFFERENTIAL
Basophils Relative: 1 % (ref 0–1)
Eosinophils Absolute: 0.2 10*3/uL (ref 0.0–0.7)
Eosinophils Relative: 3 % (ref 0–5)
Lymphs Abs: 1.6 10*3/uL (ref 0.7–4.0)
Monocytes Absolute: 0.6 10*3/uL (ref 0.1–1.0)
Monocytes Relative: 8 % (ref 3–12)

## 2011-01-06 LAB — BASIC METABOLIC PANEL
BUN: 17 mg/dL (ref 6–23)
Calcium: 8.9 mg/dL (ref 8.4–10.5)
Chloride: 105 mEq/L (ref 96–112)
Creatinine, Ser: 0.75 mg/dL (ref 0.50–1.10)
GFR calc Af Amer: 85 mL/min — ABNORMAL LOW (ref >90)
GFR calc non Af Amer: 73 mL/min — ABNORMAL LOW (ref >90)

## 2011-01-06 LAB — PRO B NATRIURETIC PEPTIDE: Pro B Natriuretic peptide (BNP): 807.2 pg/mL — ABNORMAL HIGH (ref 0–450)

## 2011-01-06 LAB — APTT: aPTT: 26 seconds (ref 24–37)

## 2011-01-06 LAB — POCT I-STAT TROPONIN I: Troponin i, poc: 0.01 ng/mL (ref 0.00–0.08)

## 2011-01-06 MED ORDER — IOHEXOL 300 MG/ML  SOLN: 100.0000 mL | Freq: Once | INTRAMUSCULAR | Status: DC | PRN

## 2011-01-08 ENCOUNTER — Telehealth: Payer: Self-pay | Admitting: Internal Medicine

## 2011-01-08 NOTE — Telephone Encounter (Signed)
Pt daughter calling stating that pt had an episode of sob, chest pain and a-fib this weekend and was taken to [facility] hospital. Pt was d/c same day however pt wanted to check with Dr. Ladona Ridgel to see if he would recommend she come into the office to be evaluated. Please return pt daughter call to discuss further.

## 2011-01-08 NOTE — Telephone Encounter (Signed)
SPOKE WITH DAUGHTER PT  WAS SENT TO  [place] HAD ELEVATED D-DIMER AT VISIT CHEST CT WAS ORDERED NEG FOR PE  ALL OTHER TESTING OKAY PER DAUGHTER PT WAS DISCHARGED  DAUGHTER WANTS F/U WITH DR Ladona Ridgel APPT  MADE WITH DR Ladona Ridgel FOR 01-09-11 AT 9:15 AM

## 2011-01-09 ENCOUNTER — Encounter: Payer: Self-pay | Admitting: Internal Medicine

## 2011-01-09 ENCOUNTER — Ambulatory Visit (INDEPENDENT_AMBULATORY_CARE_PROVIDER_SITE_OTHER): Payer: Medicare Other | Admitting: Internal Medicine

## 2011-01-09 DIAGNOSIS — I472 Ventricular tachycardia: Secondary | ICD-10-CM

## 2011-01-09 DIAGNOSIS — R002 Palpitations: Secondary | ICD-10-CM | POA: Insufficient documentation

## 2011-01-09 DIAGNOSIS — I5022 Chronic systolic (congestive) heart failure: Secondary | ICD-10-CM

## 2011-01-09 DIAGNOSIS — Z95810 Presence of automatic (implantable) cardiac defibrillator: Secondary | ICD-10-CM

## 2011-01-09 LAB — ICD DEVICE OBSERVATION
AL IMPEDENCE ICD: 464 Ohm
CHARGE TIME: 7.687 s
FVT: 0
PACEART VT: 0
RV LEAD AMPLITUDE: 5.3248 mv
RV LEAD THRESHOLD: 3 V
TOT-0001: 8
TOT-0002: 2
TZAT-0001ATACH: 3
TZAT-0001FASTVT: 1
TZAT-0002FASTVT: NEGATIVE
TZAT-0004SLOWVT: 8
TZAT-0005SLOWVT: 88 pct
TZAT-0011SLOWVT: 10 ms
TZAT-0012ATACH: 150 ms
TZAT-0012SLOWVT: 200 ms
TZAT-0013SLOWVT: 3
TZAT-0018FASTVT: NEGATIVE
TZAT-0018SLOWVT: NEGATIVE
TZAT-0019ATACH: 6 V
TZAT-0019FASTVT: 8 V
TZAT-0020ATACH: 1.5 ms
TZAT-0020ATACH: 1.5 ms
TZON-0003SLOWVT: 350 ms
TZON-0003VSLOWVT: 450 ms
TZON-0004SLOWVT: 16
TZON-0004VSLOWVT: 32
TZON-0005SLOWVT: 12
TZST-0001ATACH: 6
TZST-0001FASTVT: 2
TZST-0001FASTVT: 3
TZST-0001FASTVT: 6
TZST-0001SLOWVT: 3
TZST-0001SLOWVT: 5
TZST-0002ATACH: NEGATIVE
TZST-0002FASTVT: NEGATIVE
TZST-0002FASTVT: NEGATIVE
TZST-0003SLOWVT: 35 J
TZST-0003SLOWVT: 35 J
TZST-0003SLOWVT: 35 J
VENTRICULAR PACING ICD: 100 pct

## 2011-01-09 NOTE — Patient Instructions (Signed)
Your physician wants you to follow-up in: 4 months with Dr Court Joy will receive a reminder letter in the mail two months in advance. If you don't receive a letter, please call our office to schedule the follow-up appointment.  Remote monitoring is used to monitor your Pacemaker of ICD from home. This monitoring reduces the number of office visits required to check your device to one time per year. It allows [place] to keep an eye on the functioning of your device to ensure it is working properly. You are scheduled for a device check from home on 04/12/2011 You may send your transmission at any time that day. If you have a wireless device, the transmission will be sent automatically. After your physician reviews your transmission, you will receive a postcard with your next transmission date.

## 2011-01-09 NOTE — Progress Notes (Signed)
HPI Mrs. Andreatta returns today for followup. She was recently seen in the ER with sob and palpitations and an elevated D-dimer. She probably had PAF. She has done well since then. She now denies c/p or sob or peripheral edema. No ICD shock. She is approaching ERI. No other complaints. No Known Allergies   Current Outpatient Prescriptions  Medication Sig Dispense Refill  . aspirin 325 MG tablet Take 325 mg by mouth daily.        . calcitonin, salmon, (MIACALCIN/FORTICAL) 200 UNIT/ACT nasal spray Place 1 spray into the nose daily.        . Calcium Citrate-Vitamin D (CITRACAL + D PO) Take 2 tablets by mouth daily.        . DULoxetine (CYMBALTA) 30 MG capsule Take 30 mg by mouth daily.        . fish oil-omega-3 fatty acids 1000 MG capsule Take 2 g by mouth daily.        . lansoprazole (PREVACID) 15 MG capsule Take 15 mg by mouth daily.        . Multiple Vitamin (MULTIVITAMIN) capsule Take 1 capsule by mouth daily.        . Polysaccharide Iron Complex (POLY-IRON 150 PO) Take 1 tablet by mouth 2 (two) times daily.        . carvedilol (COREG) 6.25 MG tablet Take 1 tablet by mouth Twice daily.         No past medical history on file.  ROS:   All systems reviewed and negative except as noted in the HPI.   No past surgical history on file.   No family history on file.   History   Social History  . Marital Status: Widowed    Spouse Name: N/A    Number of Children: N/A  . Years of Education: N/A   Occupational History  . Not on file.   Social History Main Topics  . Smoking status: Never Smoker   . Smokeless tobacco: Not on file  . Alcohol Use: Not on file  . Drug Use: Not on file  . Sexually Active: Not on file   Other Topics Concern  . Not on file   Social History Narrative  . No narrative on file     BP 126/78  Pulse 91  Ht 5\' 1"  (1.549 m)  Wt 148 lb 1.9 oz (67.187 kg)  BMI 27.99 kg/m2  Physical Exam:  Well appearing elderly woman, NAD HEENT:  Unremarkable Neck:  No JVD, no thyromegally Lymphatics:  No adenopathy Back:  No CVA tenderness Lungs:  Clear with no wheezes, rales or rhonchi. Well healed ICD incision. HEART:  Regular rate rhythm, no murmurs, no rubs, no clicks Abd:  soft, positive bowel sounds, no organomegally, no rebound, no guarding Ext:  2 plus pulses, no edema, no cyanosis, no clubbing Skin:  No rashes no nodules Neuro:  CN II through XII intact, motor grossly intact  DEVICE  Normal device function.  See PaceArt for details.   Assess/Plan:

## 2011-01-09 NOTE — Assessment & Plan Note (Signed)
She is approaching ERI. I will see her back in 4 months. I would be inclined not to proceed with ICD re-implant because of her advanced age.

## 2011-01-09 NOTE — Assessment & Plan Note (Signed)
Her symptoms are resolved but I suspect she had PAF. She will continue her current meds.

## 2011-01-09 NOTE — Assessment & Plan Note (Signed)
She will continue her current meds and maintain a low sodium diet.

## 2011-01-11 ENCOUNTER — Encounter: Payer: Medicare Other | Admitting: *Deleted

## 2011-04-12 ENCOUNTER — Encounter: Payer: Medicare Other | Admitting: *Deleted

## 2011-04-16 ENCOUNTER — Encounter: Payer: Self-pay | Admitting: *Deleted

## 2011-05-02 ENCOUNTER — Encounter: Payer: Self-pay | Admitting: Internal Medicine

## 2011-05-14 ENCOUNTER — Encounter: Payer: Self-pay | Admitting: *Deleted

## 2011-05-14 ENCOUNTER — Ambulatory Visit (INDEPENDENT_AMBULATORY_CARE_PROVIDER_SITE_OTHER): Payer: Medicare Other | Admitting: Internal Medicine

## 2011-05-14 ENCOUNTER — Encounter: Payer: Self-pay | Admitting: Internal Medicine

## 2011-05-14 ENCOUNTER — Other Ambulatory Visit: Payer: Self-pay | Admitting: *Deleted

## 2011-05-14 DIAGNOSIS — I5022 Chronic systolic (congestive) heart failure: Secondary | ICD-10-CM

## 2011-05-14 DIAGNOSIS — I509 Heart failure, unspecified: Secondary | ICD-10-CM

## 2011-05-14 DIAGNOSIS — I4891 Unspecified atrial fibrillation: Secondary | ICD-10-CM

## 2011-05-14 DIAGNOSIS — Z95810 Presence of automatic (implantable) cardiac defibrillator: Secondary | ICD-10-CM

## 2011-05-14 LAB — ICD DEVICE OBSERVATION
TZAT-0001ATACH: 2
TZAT-0001FASTVT: 1
TZAT-0001SLOWVT: 1
TZAT-0002FASTVT: NEGATIVE
TZAT-0005SLOWVT: 88 pct
TZAT-0012ATACH: 150 ms
TZAT-0012ATACH: 150 ms
TZAT-0012SLOWVT: 200 ms
TZAT-0013SLOWVT: 3
TZAT-0018ATACH: NEGATIVE
TZAT-0018SLOWVT: NEGATIVE
TZAT-0019ATACH: 6 V
TZAT-0020ATACH: 1.5 ms
TZAT-0020ATACH: 1.5 ms
TZON-0003ATACH: 350 ms
TZON-0003VSLOWVT: 450 ms
TZON-0004SLOWVT: 16
TZON-0004VSLOWVT: 32
TZON-0005SLOWVT: 12
TZST-0001ATACH: 4
TZST-0001ATACH: 6
TZST-0001FASTVT: 3
TZST-0001FASTVT: 4
TZST-0001SLOWVT: 2
TZST-0001SLOWVT: 5
TZST-0002FASTVT: NEGATIVE
TZST-0002FASTVT: NEGATIVE
TZST-0002FASTVT: NEGATIVE
TZST-0003SLOWVT: 35 J
TZST-0003SLOWVT: 35 J
TZST-0003SLOWVT: 35 J

## 2011-05-14 LAB — BASIC METABOLIC PANEL
BUN: 16 mg/dL (ref 6–23)
Chloride: 109 mEq/L (ref 96–112)
GFR: 63.48 mL/min (ref >60.00)
Glucose, Bld: 81 mg/dL (ref 70–99)
Potassium: 4.1 mEq/L (ref 3.5–5.1)
Sodium: 143 mEq/L (ref 135–145)

## 2011-05-14 LAB — CBC WITH DIFFERENTIAL/PLATELET
Eosinophils Relative: 2.8 % (ref 0.0–5.0)
HCT: 38.8 % (ref 36.0–46.0)
Hemoglobin: 12.9 g/dL (ref 12.0–15.0)
Lymphs Abs: 1.7 10*3/uL (ref 0.7–4.0)
MCV: 93.5 fl (ref 78.0–100.0)
Monocytes Absolute: 0.5 10*3/uL (ref 0.1–1.0)
Monocytes Relative: 7.9 % (ref 3.0–12.0)
Neutro Abs: 4.1 10*3/uL (ref 1.4–7.7)
Platelets: 156 10*3/uL (ref 150.0–400.0)
WBC: 6.6 10*3/uL (ref 4.5–10.5)

## 2011-05-14 NOTE — Assessment & Plan Note (Signed)
Her symptoms are currently class II. She will continue her current medical therapy and maintain a low-sodium diet. 

## 2011-05-14 NOTE — Assessment & Plan Note (Signed)
Her device has reached elective replacement. She is currently utilizing her defibrillator as a pacemaker only. We will plan to remove her device because it has reached elective replacement, and insert a new pacemaker. I would consider adding a right ventricular lead if there is any question about the satisfactory use of her left ventricular lead.

## 2011-05-14 NOTE — Patient Instructions (Signed)
Please call back at [phone] to schedule device change out  Speak with Anselm Pancoast  Some dates are 05/18/11, 05/24/11, 06/06/11, 06/11/11, and 06/14/11

## 2011-05-14 NOTE — Progress Notes (Signed)
HPI [name] returns today for followup. She is a very pleasant 78-year-old woman with chronic systolic heart failure, status post ICD implantation remotely. She developed a lead fracture over a year ago. With her advanced age, it was deemed most appropriate to reprogram her device so that the broken fibrillator lead was not being utilized. The patient has done well with this. She denies chest pain, shortness of breath, or syncope. No Known Allergies   Current Outpatient Prescriptions  Medication Sig Dispense Refill  . calcitonin, salmon, (MIACALCIN/FORTICAL) 200 UNIT/ACT nasal spray Place 1 spray into the nose daily.        . Calcium Citrate-Vitamin D (CITRACAL + D PO) Take 2 tablets by mouth daily.        . carvedilol (COREG) 6.25 MG tablet Take 1 tablet by mouth Twice daily.      . DULoxetine (CYMBALTA) 30 MG capsule Take 30 mg by mouth daily.        . lansoprazole (PREVACID) 15 MG capsule Take 15 mg by mouth daily.        . Multiple Vitamin (MULTIVITAMIN) capsule Take 1 capsule by mouth daily.        . Polysaccharide Iron Complex (POLY-IRON 150 PO) Take 1 tablet by mouth 2 (two) times daily.        . torsemide (DEMADEX) 20 MG tablet Take 10 mg by mouth Twice daily.         Past Medical History  Diagnosis Date  . Dyslipidemia   . Diverticulitis   . HTN (hypertension)   . Osteoporosis   . Spinal stenosis   . Chronic low back pain   . CAD (coronary artery disease)   . Pulmonary embolism   . Nonischemic cardiomyopathy   . Ulcer disease   . H/O: hysterectomy     ROS:   All systems reviewed and negative except as noted in the HPI.   Past Surgical History  Procedure Date  . Right shoulder repair 1985    Dr. [name]  . Left shoulder replacement 1991    Dr. [name]  . Appendectomy 2010  . Cataract extraction   . Colon resection 2005  . Back surgery   . Kidney replacement   . Rotator cuff debridement 1999    Dr. Alusio  . Coronary angioplasty with stent placement 01/2003  .  Hemicolectomy      Family History  Problem Relation Age of Onset  . Coronary artery disease Sister   . Heart attack Father 58  . Heart attack Mother   . Other Mother     abdominal blockage     History   Social History  . Marital Status: Widowed    Spouse Name: N/A    Number of Children: N/A  . Years of Education: N/A   Occupational History  . Not on file.   Social History Main Topics  . Smoking status: Never Smoker   . Smokeless tobacco: Not on file  . Alcohol Use: No  . Drug Use: No  . Sexually Active: Not on file   Other Topics Concern  . Not on file   Social History Narrative  . No narrative on file     BP 118/70  Pulse 62  Wt 67.949 kg (149 lb 12.8 oz)  Physical Exam:  Well appearing elderly woman, NAD HEENT: Unremarkable Neck:  No JVD, no thyromegally Lungs:  Clear with no wheezes, rales, or rhonchi. Well-healed ICD incision. HEART:  Regular rate rhythm, no murmurs, no rubs,   no clicks Abd:  soft, positive bowel sounds, no organomegally, no rebound, no guarding Ext:  2 plus pulses, no edema, no cyanosis, no clubbing Skin:  No rashes no nodules Neuro:  CN II through XII intact, motor grossly intact   DEVICE  Normal device function.  See PaceArt for details.   Assess/Plan:   

## 2011-05-15 ENCOUNTER — Encounter (HOSPITAL_COMMUNITY): Payer: Self-pay | Admitting: Pharmacy Technician

## 2011-05-23 MED ORDER — GENTAMICIN SULFATE 40 MG/ML IJ SOLN
80.0000 mg | INTRAMUSCULAR | Status: AC
  Filled 2011-05-23: qty 2

## 2011-05-23 MED ORDER — CEFAZOLIN SODIUM 1-5 GM-% IV SOLN: 1.0000 g | INTRAVENOUS | Status: AC

## 2011-05-23 MED ORDER — SODIUM CHLORIDE 0.9 % IV SOLN
INTRAVENOUS | Status: DC
Start: 2011-05-23 — End: 2011-05-24
  Administered 2011-05-24: 10:00:00 via INTRAVENOUS

## 2011-05-23 MED ORDER — CHLORHEXIDINE GLUCONATE 4 % EX LIQD
60.0000 mL | Freq: Once | CUTANEOUS | Status: DC
  Filled 2011-05-23: qty 60

## 2011-05-24 ENCOUNTER — Ambulatory Visit (HOSPITAL_COMMUNITY)
Admission: RE | Admit: 2011-05-24 | Discharge: 2011-05-24 | Disposition: A | Discharge status: Home / Self Care | Payer: Medicare Other | Source: Ambulatory Visit | Attending: Internal Medicine | Admitting: Internal Medicine

## 2011-05-24 ENCOUNTER — Encounter (HOSPITAL_COMMUNITY)
Admission: RE | Disposition: A | Discharge status: Home / Self Care | Payer: Self-pay | Source: Ambulatory Visit | Attending: Internal Medicine

## 2011-05-24 DIAGNOSIS — I5022 Chronic systolic (congestive) heart failure: Secondary | ICD-10-CM

## 2011-05-24 DIAGNOSIS — I509 Heart failure, unspecified: Secondary | ICD-10-CM

## 2011-05-24 DIAGNOSIS — I10 Essential (primary) hypertension: Secondary | ICD-10-CM | POA: Insufficient documentation

## 2011-05-24 DIAGNOSIS — I428 Other cardiomyopathies: Secondary | ICD-10-CM

## 2011-05-24 DIAGNOSIS — I2510 Atherosclerotic heart disease of native coronary artery without angina pectoris: Secondary | ICD-10-CM | POA: Insufficient documentation

## 2011-05-24 DIAGNOSIS — I4891 Unspecified atrial fibrillation: Secondary | ICD-10-CM

## 2011-05-24 DIAGNOSIS — Z4502 Encounter for adjustment and management of automatic implantable cardiac defibrillator: Secondary | ICD-10-CM | POA: Insufficient documentation

## 2011-05-24 DIAGNOSIS — E785 Hyperlipidemia, unspecified: Secondary | ICD-10-CM | POA: Insufficient documentation

## 2011-05-24 HISTORY — PX: BIV ICD GENERTAOR CHANGE OUT: SHX5745

## 2011-05-24 LAB — SURGICAL PCR SCREEN: Staphylococcus aureus: NEGATIVE

## 2011-05-24 LAB — PROTIME-INR
INR: 0.97 (ref 0.00–1.49)
Prothrombin Time: 13.1 seconds (ref 11.6–15.2)

## 2011-05-24 SURGERY — BIV ICD GENERTAOR CHANGE OUT
Anesthesia: Moderate Sedation

## 2011-05-24 MED ORDER — CEFAZOLIN SODIUM 1-5 GM-% IV SOLN
INTRAVENOUS | Status: AC
  Filled 2011-05-24: qty 50

## 2011-05-24 MED ORDER — MIDAZOLAM HCL 5 MG/5ML IJ SOLN
INTRAMUSCULAR | Status: AC
  Filled 2011-05-24: qty 5

## 2011-05-24 MED ORDER — FENTANYL CITRATE 0.05 MG/ML IJ SOLN
INTRAMUSCULAR | Status: AC
  Filled 2011-05-24: qty 2

## 2011-05-24 MED ORDER — MUPIROCIN 2 % EX OINT
TOPICAL_OINTMENT | Freq: Once | CUTANEOUS | Status: AC
  Administered 2011-05-24: 1 via NASAL

## 2011-05-24 MED ORDER — LIDOCAINE HCL (PF) 1 % IJ SOLN
INTRAMUSCULAR | Status: AC
  Filled 2011-05-24: qty 60

## 2011-05-24 MED ORDER — MUPIROCIN 2 % EX OINT
TOPICAL_OINTMENT | CUTANEOUS | Status: AC
  Administered 2011-05-24: 1 via NASAL
  Filled 2011-05-24: qty 22

## 2011-05-24 MED ORDER — ONDANSETRON HCL 4 MG/2ML IJ SOLN: 4.0000 mg | Freq: Four times a day (QID) | INTRAMUSCULAR | Status: DC | PRN

## 2011-05-24 MED ORDER — ACETAMINOPHEN 325 MG PO TABS: 325.0000 mg | ORAL_TABLET | ORAL | Status: DC | PRN

## 2011-05-24 NOTE — Op Note (Signed)
ICD removal and insertion of a new DDD PPM without immediate complication.W#[id number].

## 2011-05-24 NOTE — H&P (View-Only) (Signed)
HPI [name] returns today for followup. She is a very pleasant 78 year old woman with chronic systolic heart failure, status post ICD implantation remotely. She developed a lead fracture over a year ago. With her advanced age, it was deemed most appropriate to reprogram her device so that the broken fibrillator lead was not being utilized. The patient has done well with this. She denies chest pain, shortness of breath, or syncope. No Known Allergies   Current Outpatient Prescriptions  Medication Sig Dispense Refill  . calcitonin, salmon, (MIACALCIN/FORTICAL) 200 UNIT/ACT nasal spray Place 1 spray into the nose daily.        . Calcium Citrate-Vitamin D (CITRACAL + D PO) Take 2 tablets by mouth daily.        . carvedilol (COREG) 6.25 MG tablet Take 1 tablet by mouth Twice daily.      . DULoxetine (CYMBALTA) 30 MG capsule Take 30 mg by mouth daily.        . lansoprazole (PREVACID) 15 MG capsule Take 15 mg by mouth daily.        . Multiple Vitamin (MULTIVITAMIN) capsule Take 1 capsule by mouth daily.        . Polysaccharide Iron Complex (POLY-IRON 150 PO) Take 1 tablet by mouth 2 (two) times daily.        Marland Kitchen torsemide (DEMADEX) 20 MG tablet Take 10 mg by mouth Twice daily.         Past Medical History  Diagnosis Date  . Dyslipidemia   . Diverticulitis   . HTN (hypertension)   . Osteoporosis   . Spinal stenosis   . Chronic low back pain   . CAD (coronary artery disease)   . Pulmonary embolism   . Nonischemic cardiomyopathy   . Ulcer disease   . H/O: hysterectomy     ROS:   All systems reviewed and negative except as noted in the HPI.   Past Surgical History  Procedure Date  . Right shoulder repair 1985    Dr. [name]  . Left shoulder replacement 1991    Dr. [name]  . Appendectomy 2010  . Cataract extraction   . Colon resection 2005  . Back surgery   . Kidney replacement   . Rotator cuff debridement 1999    Dr. [name]  . Coronary angioplasty with stent placement 01/2003  .  Hemicolectomy      Family History  Problem Relation Age of Onset  . Coronary artery disease Sister   . Heart attack Father 41  . Heart attack Mother   . Other Mother     abdominal blockage     History   Social History  . Marital Status: Widowed    Spouse Name: N/A    Number of Children: N/A  . Years of Education: N/A   Occupational History  . Not on file.   Social History Main Topics  . Smoking status: Never Smoker   . Smokeless tobacco: Not on file  . Alcohol Use: No  . Drug Use: No  . Sexually Active: Not on file   Other Topics Concern  . Not on file   Social History Narrative  . No narrative on file     BP 118/70  Pulse 62  Wt 67.949 kg (149 lb 12.8 oz)  Physical Exam:  Well appearing elderly woman, NAD HEENT: Unremarkable Neck:  No JVD, no thyromegally Lungs:  Clear with no wheezes, rales, or rhonchi. Well-healed ICD incision. HEART:  Regular rate rhythm, no murmurs, no rubs,  no clicks Abd:  soft, positive bowel sounds, no organomegally, no rebound, no guarding Ext:  2 plus pulses, no edema, no cyanosis, no clubbing Skin:  No rashes no nodules Neuro:  CN II through XII intact, motor grossly intact   DEVICE  Normal device function.  See PaceArt for details.   Assess/Plan:

## 2011-05-24 NOTE — Interval H&P Note (Signed)
History and Physical Interval Note:  05/24/2011 11:51 AM  [name]  has presented today for surgery, with the diagnosis of eri  The various methods of treatment have been discussed with the patient and family. After consideration of risks, benefits and other options for treatment, the patient has consented to  Procedure(s) (LRB): BIV ICD GENERTAOR CHANGE OUT (N/A) as a surgical intervention .  The patients' history has been reviewed, patient examined, no change in status, stable for surgery.  I have reviewed the patients' chart and labs.  Questions were answered to the patient's satisfaction.     Lewayne Bunting

## 2011-05-25 NOTE — Op Note (Signed)
[name], [name]                ACCOUNT NO.:  [id number]  MEDICAL RECORD NO.:  [id number]  LOCATION:  [facility]                         FACILITY:  [facility]  PHYSICIAN:  Doylene Canning. Ladona Ridgel, MD    DATE OF BIRTH:  [birth year]  DATE OF PROCEDURE:  05/24/2011 DATE OF DISCHARGE:  05/24/2011                              OPERATIVE REPORT   PROCEDURE PERFORMED:  Removal of previously implanted Bi-V ICD and insertion of a dual-chamber pacemaker, utilizing the previously implanted right atrial and left ventricular lead.  INTRODUCTION:  The patient is an 78 year old woman with a nonischemic cardiomyopathy, status post Bi-V ICD insertion.  The patient developed fracture of her RV lead and she now has developed device elective replacement.  Because of her advanced age, it is deemed most appropriate to proceed with removal of her Bi-V ICD and insertion of a dual-chamber pacemaker.  PROCEDURE:  After informed was obtained, the patient was taken to the diagnostic EP lab in a fasting state.  After usual preparation and draping, intravenous fentanyl and midazolam was given for sedation. Lidocaine 30 mL was infiltrated into the left infraclavicular region.  A 5-cm incision was carried out over this region and electrocautery was utilized to dissect down to the ICD pocket.  It was removed with gentle traction.  The right ventricular lead was capped.  The LV lead was evaluated.  The R-waves were greater than 20, the impedance was 770 and threshold was less than 1 volt at 0.5 msec.  In the same way, the atrial lead was evaluated.  The P-waves were 3.8, the pacing threshold was 0.5 at 0.5, and the pacing impedance was 500 ohms.  With these satisfactory parameters, the new Medtronic Sensia dual-chamber pacemaker serial number [id number] was connected to the old right atrial and old left ventricular leads and placed back in the subcutaneous pocket.  The pocket was irrigated with antibiotic irrigation, and the  incision was closed with 2-0 and 3-0 Vicryl.  Benzoin and Steri-Strips were painted on the skin, pressure dressing was applied, the patient was returned to her room in satisfactory condition.  COMPLICATIONS:  There were no immediate procedure complications.  RESULTS:  This demonstrates successful removal of previously implanted Medtronic Bi-V ICD and insertion of a new dual-chamber pacemaker.     Doylene Canning. Ladona Ridgel, MD     GWT/MEDQ  D:  05/24/2011  T:  05/25/2011  Job:  [id number]

## 2011-06-07 ENCOUNTER — Encounter: Payer: Self-pay | Admitting: Internal Medicine

## 2011-06-07 ENCOUNTER — Ambulatory Visit (INDEPENDENT_AMBULATORY_CARE_PROVIDER_SITE_OTHER): Payer: Medicare Other | Admitting: *Deleted

## 2011-06-07 DIAGNOSIS — I5022 Chronic systolic (congestive) heart failure: Secondary | ICD-10-CM

## 2011-06-07 DIAGNOSIS — I428 Other cardiomyopathies: Secondary | ICD-10-CM

## 2011-06-07 DIAGNOSIS — R002 Palpitations: Secondary | ICD-10-CM

## 2011-06-07 LAB — PACEMAKER DEVICE OBSERVATION
AL AMPLITUDE: 4 mv
RV LEAD AMPLITUDE: 31.36 mv
RV LEAD IMPEDENCE PM: 775 Ohm
RV LEAD THRESHOLD: 1.25 V
VENTRICULAR PACING PM: 47

## 2011-06-07 NOTE — Progress Notes (Signed)
Wound check-PPM 

## 2011-07-02 ENCOUNTER — Telehealth: Payer: Self-pay | Admitting: Internal Medicine

## 2011-07-02 NOTE — Telephone Encounter (Signed)
New msg Pt's daughter called and said she is currently going to [place] to see Dr there but she wants to switch to [facility]. She also comes to see Dr Ladona Ridgel for her pacemaker. Please call about which Dr He would recommend Please call

## 2011-07-04 NOTE — Telephone Encounter (Signed)
Dr Ladona Ridgel can do both.  He can refer her if needed

## 2011-09-11 ENCOUNTER — Encounter: Payer: Self-pay | Admitting: Internal Medicine

## 2011-09-11 ENCOUNTER — Ambulatory Visit (INDEPENDENT_AMBULATORY_CARE_PROVIDER_SITE_OTHER): Payer: Medicare Other | Admitting: Internal Medicine

## 2011-09-11 VITALS — BP 120/60 | Resp 18 | Ht 61.0 in | Wt 146.1 lb

## 2011-09-11 DIAGNOSIS — I5022 Chronic systolic (congestive) heart failure: Secondary | ICD-10-CM

## 2011-09-11 DIAGNOSIS — Z950 Presence of cardiac pacemaker: Secondary | ICD-10-CM

## 2011-09-11 DIAGNOSIS — R002 Palpitations: Secondary | ICD-10-CM

## 2011-09-11 LAB — PACEMAKER DEVICE OBSERVATION
AL THRESHOLD: 0.75 V
ATRIAL PACING PM: 41
BAMS-0001: 150 {beats}/min
RV LEAD IMPEDENCE PM: 823 Ohm
RV LEAD THRESHOLD: 1.125 V

## 2011-09-11 NOTE — Assessment & Plan Note (Signed)
Her device is working normally. Will recheck in several months. 

## 2011-09-11 NOTE — Progress Notes (Signed)
HPI [name] returns today for followup. She is a pleasant elderly woman with symptomatic bradycardia, s/p PPM. She denies chest pain or sob. No syncope.  No Known Allergies   Current Outpatient Prescriptions  Medication Sig Dispense Refill  . calcitonin, salmon, (MIACALCIN/FORTICAL) 200 UNIT/ACT nasal spray Place 1 spray into the nose daily as needed. For congestion      . Calcium Citrate-Vitamin D (CITRACAL + D PO) Take 2 tablets by mouth daily.        . carvedilol (COREG) 6.25 MG tablet Take 1 tablet by mouth Twice daily.      Marland Kitchen DETROL LA 4 MG 24 hr capsule       . DULoxetine (CYMBALTA) 30 MG capsule Take 30 mg by mouth daily.        . lansoprazole (PREVACID) 15 MG capsule Take 15 mg by mouth daily as needed. For reflux.      . Multiple Vitamin (MULITIVITAMIN WITH MINERALS) TABS Take 1 tablet by mouth daily.      . Polysaccharide Iron Complex (POLY-IRON 150 PO) Take 1 tablet by mouth 2 (two) times daily.        . polyvinyl alcohol (LIQUIFILM TEARS) 1.4 % ophthalmic solution Place 1 drop into both eyes daily as needed. For dry eyes.      Marland Kitchen torsemide (DEMADEX) 20 MG tablet Take 10 mg by mouth Twice daily.         Past Medical History  Diagnosis Date  . Dyslipidemia   . Diverticulitis   . HTN (hypertension)   . Osteoporosis   . Spinal stenosis   . Chronic low back pain   . CAD (coronary artery disease)   . Pulmonary embolism   . Nonischemic cardiomyopathy   . Ulcer disease   . H/O: hysterectomy     ROS:   All systems reviewed and negative except as noted in the HPI.   Past Surgical History  Procedure Date  . Right shoulder repair 1985    Dr. [name]  . Left shoulder replacement 1991    Dr. [name]  . Appendectomy 2010  . Cataract extraction   . Colon resection 2005  . Back surgery   . Kidney replacement   . Rotator cuff debridement 1999    Dr. [name]  . Coronary angioplasty with stent placement 01/2003  . Hemicolectomy      Family History  Problem Relation Age  of Onset  . Coronary artery disease Sister   . Heart attack Father 4  . Heart attack Mother   . Other Mother     abdominal blockage     History   Social History  . Marital Status: Widowed    Spouse Name: N/A    Number of Children: N/A  . Years of Education: N/A   Occupational History  . Not on file.   Social History Main Topics  . Smoking status: Never Smoker   . Smokeless tobacco: Not on file  . Alcohol Use: No  . Drug Use: No  . Sexually Active: Not on file   Other Topics Concern  . Not on file   Social History Narrative  . No narrative on file     BP 120/60  Resp 18  Ht 5\' 1"  (1.549 m)  Wt 146 lb 1.9 oz (66.28 kg)  BMI 27.61 kg/m2  Physical Exam:  Well appearing elderly woman, NAD HEENT: Unremarkable Neck:  No JVD, no thyromegally Lungs:  Clear with no wheezes. Well healed PPM incision. HEART:  Regular rate rhythm, no murmurs, no rubs, no clicks Abd:  soft, positive bowel sounds, no organomegally, no rebound, no guarding Ext:  2 plus pulses, no edema, no cyanosis, no clubbing Skin:  No rashes no nodules Neuro:  CN II through XII intact, motor grossly intact  DEVICE  Normal device function.  See PaceArt for details.   Assess/Plan:

## 2011-09-11 NOTE — Assessment & Plan Note (Signed)
These are resolved. Will follow.

## 2011-09-11 NOTE — Patient Instructions (Addendum)
Your physician wants you to follow-up in: March 2014 You will receive a reminder letter in the mail two months in advance. If you don't receive a letter, please call our office to schedule the follow-up appointment.  

## 2011-11-12 ENCOUNTER — Emergency Department (HOSPITAL_COMMUNITY): Payer: Medicare Other

## 2011-11-12 ENCOUNTER — Encounter (HOSPITAL_COMMUNITY): Payer: Self-pay | Admitting: Emergency Medicine

## 2011-11-12 ENCOUNTER — Emergency Department (HOSPITAL_COMMUNITY)
Admission: EM | Admit: 2011-11-12 | Discharge: 2011-11-12 | Disposition: A | Discharge status: Home / Self Care | Payer: Medicare Other | Attending: Emergency Medicine | Admitting: Emergency Medicine

## 2011-11-12 DIAGNOSIS — Z86711 Personal history of pulmonary embolism: Secondary | ICD-10-CM | POA: Insufficient documentation

## 2011-11-12 DIAGNOSIS — I10 Essential (primary) hypertension: Secondary | ICD-10-CM | POA: Insufficient documentation

## 2011-11-12 DIAGNOSIS — I2510 Atherosclerotic heart disease of native coronary artery without angina pectoris: Secondary | ICD-10-CM | POA: Insufficient documentation

## 2011-11-12 DIAGNOSIS — R079 Chest pain, unspecified: Secondary | ICD-10-CM | POA: Insufficient documentation

## 2011-11-12 DIAGNOSIS — G8929 Other chronic pain: Secondary | ICD-10-CM | POA: Insufficient documentation

## 2011-11-12 DIAGNOSIS — E785 Hyperlipidemia, unspecified: Secondary | ICD-10-CM | POA: Insufficient documentation

## 2011-11-12 DIAGNOSIS — M810 Age-related osteoporosis without current pathological fracture: Secondary | ICD-10-CM | POA: Insufficient documentation

## 2011-11-12 LAB — CBC WITH DIFFERENTIAL/PLATELET
Basophils Absolute: 0.1 10*3/uL (ref 0.0–0.1)
Eosinophils Absolute: 0.2 10*3/uL (ref 0.0–0.7)
Eosinophils Relative: 2 % (ref 0–5)
Lymphocytes Relative: 17 % (ref 12–46)
MCV: 93.5 fL (ref 78.0–100.0)
Neutrophils Relative %: 71 % (ref 43–77)
Platelets: 186 10*3/uL (ref 150–400)
RDW: 13.6 % (ref 11.5–15.5)
WBC: 9.2 10*3/uL (ref 4.0–10.5)

## 2011-11-12 LAB — BASIC METABOLIC PANEL
CO2: 25 mEq/L (ref 19–32)
Calcium: 9.1 mg/dL (ref 8.4–10.5)
GFR calc non Af Amer: 59 mL/min — ABNORMAL LOW (ref >90)
Potassium: 4 mEq/L (ref 3.5–5.1)
Sodium: 140 mEq/L (ref 135–145)

## 2011-11-12 LAB — TROPONIN I: Troponin I: <0.3 ng/mL (ref <0.30)

## 2011-11-12 MED ORDER — NITROGLYCERIN 0.4 MG SL SUBL: 0.4000 mg | SUBLINGUAL_TABLET | SUBLINGUAL | Status: DC | PRN

## 2011-11-12 NOTE — ED Notes (Signed)
Given Rx x1, out to d/c desk in w/c by NT, no changes, "feels, good fine, normal", denies sx or complaints, denies questions needs sx or concerns unmet.

## 2011-11-12 NOTE — ED Notes (Signed)
Pt arrived by EMS. Pt started having left side neck pain that then turned into chest pain. Pt stated to EMS that when she would lay a certain way she would feel a fluttering in her chest. EMS administered Nitro x1 dose and it was effective. BP went to 107 systolic after first dose of Nitro. EKG was done by EMS. Pt denies CP and fluttering in her chest. Pt does have some shortness of breath and stated that it feels like she needs to take a deep breath.

## 2011-11-12 NOTE — ED Notes (Signed)
ASA 324 PO chewed by EMS PTA per pt

## 2011-11-12 NOTE — ED Notes (Signed)
Alert, NAD, calm, interactive, skin W&D, resps e/u, speaking in clear complete sentences, VSS, denies sx or complaints.

## 2011-11-12 NOTE — ED Provider Notes (Signed)
History     CSN: [id number]  Arrival date & time 11/12/11  4098   First MD Initiated Contact with Patient 11/12/11 1855      Chief Complaint  Patient presents with  . Chest Pain    (Consider location/radiation/quality/duration/timing/severity/associated sxs/prior treatment) HPI... left-sided chest pain since this morning. Minimal dyspnea. No diaphoresis or nausea. Given one nitroglycerin which seemed to help. Radiation to left neck.  Feeling back to baseline now.  Nothing makes symptoms better or worse. Severity is mild.  Past Medical History  Diagnosis Date  . Dyslipidemia   . Diverticulitis   . HTN (hypertension)   . Osteoporosis   . Spinal stenosis   . Chronic low back pain   . CAD (coronary artery disease)   . Pulmonary embolism   . Nonischemic cardiomyopathy   . Ulcer disease   . H/O: hysterectomy     Past Surgical History  Procedure Date  . Right shoulder repair 1985    Dr. [name]  . Left shoulder replacement 1991    Dr. [name]  . Appendectomy 2010  . Cataract extraction   . Colon resection 2005  . Back surgery   . Rotator cuff debridement 1999    Dr. [name]  . Coronary angioplasty with stent placement 01/2003  . Hemicolectomy     Family History  Problem Relation Age of Onset  . Coronary artery disease Sister   . Heart attack Father 40  . Heart attack Mother   . Other Mother     abdominal blockage    History  Substance Use Topics  . Smoking status: Never Smoker   . Smokeless tobacco: Not on file  . Alcohol Use: No    OB History    Grav Para Term Preterm Abortions TAB SAB Ect Mult Living                  Review of Systems  All other systems reviewed and are negative.    Allergies  Neosporin  Home Medications   Current Outpatient Rx  Name Route Sig Dispense Refill  . ASPIRIN 325 MG PO TABS Oral Take 325 mg by mouth daily.    Marland Kitchen CALCITONIN (SALMON) 200 UNIT/ACT NA SOLN Nasal Place 1 spray into the nose daily as needed. For congestion      . CITRACAL + D PO Oral Take 2 tablets by mouth daily.      Marland Kitchen CARVEDILOL 6.25 MG PO TABS Oral Take 1 tablet by mouth Twice daily.    Marland Kitchen DETROL LA 4 MG PO CP24 Oral Take 4 mg by mouth daily.     . DULOXETINE HCL 30 MG PO CPEP Oral Take 30 mg by mouth daily.      Marland Kitchen LANSOPRAZOLE 15 MG PO CPDR Oral Take 15 mg by mouth daily as needed. For reflux.    . ADULT MULTIVITAMIN W/MINERALS CH Oral Take 1 tablet by mouth daily.    Marland Kitchen POLY-IRON 150 PO Oral Take 1 tablet by mouth 2 (two) times daily.      Marland Kitchen POLYVINYL ALCOHOL 1.4 % OP SOLN Both Eyes Place 1 drop into both eyes daily as needed. For dry eyes.    . TORSEMIDE 20 MG PO TABS Oral Take 10 mg by mouth Twice daily.    Marland Kitchen NITROGLYCERIN 0.4 MG SL SUBL Sublingual Place 1 tablet (0.4 mg total) under the tongue every 5 (five) minutes as needed for chest pain. 30 tablet 0    BP 112/75  Temp 98.4 F (  36.9 C) (Oral)  Resp 18  SpO2 96%  Physical Exam  Nursing note and vitals reviewed. Constitutional: She is oriented to person, place, and time. She appears well-developed and well-nourished.  HENT:  Head: Normocephalic and atraumatic.  Eyes: Conjunctivae and EOM are normal. Pupils are equal, round, and reactive to light.  Neck: Normal range of motion. Neck supple.  Cardiovascular: Normal rate and regular rhythm.   Pulmonary/Chest: Effort normal and breath sounds normal.  Abdominal: Soft. Bowel sounds are normal.  Musculoskeletal: Normal range of motion.  Neurological: She is alert and oriented to person, place, and time.  Skin: Skin is warm and dry.  Psychiatric: She has a normal mood and affect.    ED Course  Procedures (including critical care time)  Labs Reviewed  CBC WITH DIFFERENTIAL - Abnormal; Notable for the following:    Hemoglobin 11.9 (*)     All other components within normal limits  BASIC METABOLIC PANEL - Abnormal; Notable for the following:    Glucose, Bld 107 (*)     GFR calc non Af Amer 59 (*)     GFR calc Af Amer 68 (*)      All other components within normal limits  TROPONIN I   Dg Chest Port 1 View  11/12/2011  *RADIOLOGY REPORT*  Clinical Data: Chest pain and shortness of breath.  PORTABLE CHEST - 1 VIEW  Comparison: CT chest and plain film chest 01/06/2011.  Findings: Large hiatal hernia with intrathoracic stomach again seen.  Heart size is normal.  Lungs are clear.  No pneumothorax or pleural fluid.  Pacing device is in place.  Marked degenerative disease about the right shoulder and left shoulder arthroplasty noted.  IMPRESSION:  1.  No acute abnormality. 2.  Large hiatal hernia.   Original Report Authenticated By: Bernadene Bell. Maricela Curet, M.D.    Date: 11/12/2011  Rate:83  Rhythm: normal sinus rhythm  QRS Axis: normal  Intervals: normal  ST/T Wave abnormalities: normal  Conduction Disutrbances:left bundle branch block  Narrative Interpretation:   Old EKG Reviewed: changes noted   1. Chest pain       MDM    Sx free in ED.  Trop and ekg neg.   rx ntg tabs.  F/u primary care.      Donnetta Hutching, MD 11/12/11 2149

## 2011-11-13 ENCOUNTER — Telehealth: Payer: Self-pay | Admitting: Internal Medicine

## 2011-11-13 NOTE — Telephone Encounter (Signed)
Spoke with patients daughter  She is going to call us back if her Mom has any further problems.  At this point she was checked out yesterday and given SL NTG and will use if she has further angina

## 2011-11-13 NOTE — Telephone Encounter (Signed)
PT HAD EPISODE YESTERDAY , WENT TO ER, NECK PAIN, FELT CHEST PAIN, PRESSURE, SOB, NURSING HOME CALLED 911, EKG, CXR, AND BLOOD TESTS WERE DONE ALL NORMAL, EMS GAVE HER NITRO AND WAS GIVEN AN RX FOR IT, PLS CALL [phone]

## 2011-12-12 ENCOUNTER — Emergency Department (HOSPITAL_BASED_OUTPATIENT_CLINIC_OR_DEPARTMENT_OTHER)
Admission: EM | Admit: 2011-12-12 | Discharge: 2011-12-12 | Disposition: A | Discharge status: Home / Self Care | Payer: Medicare Other | Attending: Emergency Medicine | Admitting: Emergency Medicine

## 2011-12-12 ENCOUNTER — Encounter (HOSPITAL_BASED_OUTPATIENT_CLINIC_OR_DEPARTMENT_OTHER): Payer: Self-pay | Admitting: Family Medicine

## 2011-12-12 ENCOUNTER — Emergency Department (HOSPITAL_BASED_OUTPATIENT_CLINIC_OR_DEPARTMENT_OTHER): Payer: Medicare Other

## 2011-12-12 DIAGNOSIS — Z7982 Long term (current) use of aspirin: Secondary | ICD-10-CM | POA: Insufficient documentation

## 2011-12-12 DIAGNOSIS — Z86718 Personal history of other venous thrombosis and embolism: Secondary | ICD-10-CM | POA: Insufficient documentation

## 2011-12-12 DIAGNOSIS — Z79899 Other long term (current) drug therapy: Secondary | ICD-10-CM | POA: Insufficient documentation

## 2011-12-12 DIAGNOSIS — I10 Essential (primary) hypertension: Secondary | ICD-10-CM | POA: Insufficient documentation

## 2011-12-12 DIAGNOSIS — M810 Age-related osteoporosis without current pathological fracture: Secondary | ICD-10-CM | POA: Insufficient documentation

## 2011-12-12 DIAGNOSIS — I428 Other cardiomyopathies: Secondary | ICD-10-CM | POA: Insufficient documentation

## 2011-12-12 DIAGNOSIS — E785 Hyperlipidemia, unspecified: Secondary | ICD-10-CM | POA: Insufficient documentation

## 2011-12-12 DIAGNOSIS — I2510 Atherosclerotic heart disease of native coronary artery without angina pectoris: Secondary | ICD-10-CM | POA: Insufficient documentation

## 2011-12-12 DIAGNOSIS — M109 Gout, unspecified: Secondary | ICD-10-CM | POA: Insufficient documentation

## 2011-12-12 DIAGNOSIS — Z883 Allergy status to other anti-infective agents status: Secondary | ICD-10-CM | POA: Insufficient documentation

## 2011-12-12 LAB — URINALYSIS, ROUTINE W REFLEX MICROSCOPIC
Bilirubin Urine: NEGATIVE
Leukocytes, UA: NEGATIVE
Nitrite: NEGATIVE
Specific Gravity, Urine: 1.008 (ref 1.005–1.030)
pH: 5 (ref 5.0–8.0)

## 2011-12-12 LAB — BASIC METABOLIC PANEL
BUN: 22 mg/dL (ref 6–23)
Chloride: 95 mEq/L — ABNORMAL LOW (ref 96–112)
GFR calc Af Amer: 56 mL/min — ABNORMAL LOW (ref >90)
Potassium: 3.4 mEq/L — ABNORMAL LOW (ref 3.5–5.1)

## 2011-12-12 LAB — CBC WITH DIFFERENTIAL/PLATELET
Basophils Relative: 0 % (ref 0–1)
Hemoglobin: 12.9 g/dL (ref 12.0–15.0)
Lymphs Abs: 1.1 10*3/uL (ref 0.7–4.0)
MCHC: 33.8 g/dL (ref 30.0–36.0)
Monocytes Relative: 10 % (ref 3–12)
Neutro Abs: 7.6 10*3/uL (ref 1.7–7.7)
Neutrophils Relative %: 78 % — ABNORMAL HIGH (ref 43–77)
RBC: 4.13 MIL/uL (ref 3.87–5.11)
WBC: 9.8 10*3/uL (ref 4.0–10.5)

## 2011-12-12 LAB — URIC ACID: Uric Acid, Serum: 9.4 mg/dL — ABNORMAL HIGH (ref 2.4–7.0)

## 2011-12-12 MED ORDER — PREDNISONE 10 MG PO TABS: ORAL_TABLET | ORAL | Status: DC

## 2011-12-12 NOTE — ED Notes (Signed)
Pt given water due to need for urine

## 2011-12-12 NOTE — ED Notes (Signed)
Redness to right foot marked-Pt asssited with redress-NAD-states foot pain is better

## 2011-12-12 NOTE — ED Provider Notes (Signed)
History     CSN: [id number]  Arrival date & time 12/12/11  1146   First MD Initiated Contact with Patient 12/12/11 1211      Chief Complaint  Patient presents with  . Foot Pain    (Consider location/radiation/quality/duration/timing/severity/associated sxs/prior treatment) Patient is a 78 y.o. female presenting with lower extremity pain. The history is provided by the patient. No language interpreter was used.  Foot Pain This is a new problem. The current episode started today. The problem occurs constantly. The problem has been gradually worsening. Pertinent negatives include no joint swelling. Associated symptoms comments: Pain right foot. The symptoms are aggravated by bending and standing. She has tried nothing for the symptoms.  Pt complains of pain in her right foot.   Daughter also concerned about a urinary tract infection.   Pt has been a little more confused than usual.   Pt has had gout in the past  Past Medical History  Diagnosis Date  . Dyslipidemia   . Diverticulitis   . HTN (hypertension)   . Osteoporosis   . Spinal stenosis   . Chronic low back pain   . CAD (coronary artery disease)   . Pulmonary embolism   . Nonischemic cardiomyopathy   . Ulcer disease   . H/O: hysterectomy     Past Surgical History  Procedure Date  . Right shoulder repair 1985    Dr. [name]  . Left shoulder replacement 1991    Dr. [name]  . Appendectomy 2010  . Cataract extraction   . Colon resection 2005  . Back surgery   . Rotator cuff debridement 1999    Dr. [name]  . Coronary angioplasty with stent placement 01/2003  . Hemicolectomy     Family History  Problem Relation Age of Onset  . Coronary artery disease Sister   . Heart attack Father 56  . Heart attack Mother   . Other Mother     abdominal blockage    History  Substance Use Topics  . Smoking status: Never Smoker   . Smokeless tobacco: Not on file  . Alcohol Use: No    OB History    Grav Para Term Preterm  Abortions TAB SAB Ect Mult Living                  Review of Systems  Musculoskeletal: Negative for joint swelling.  All other systems reviewed and are negative.    Allergies  Neosporin  Home Medications   Current Outpatient Rx  Name Route Sig Dispense Refill  . ASPIRIN 325 MG PO TABS Oral Take 325 mg by mouth daily.    Marland Kitchen CALCITONIN (SALMON) 200 UNIT/ACT NA SOLN Nasal Place 1 spray into the nose daily as needed. For congestion    . CITRACAL + D PO Oral Take 2 tablets by mouth daily.      Marland Kitchen CARVEDILOL 6.25 MG PO TABS Oral Take 1 tablet by mouth Twice daily.    Marland Kitchen DETROL LA 4 MG PO CP24 Oral Take 4 mg by mouth daily.     . DULOXETINE HCL 30 MG PO CPEP Oral Take 30 mg by mouth daily.      Marland Kitchen LANSOPRAZOLE 15 MG PO CPDR Oral Take 15 mg by mouth daily as needed. For reflux.    . ADULT MULTIVITAMIN W/MINERALS CH Oral Take 1 tablet by mouth daily.    Marland Kitchen NITROGLYCERIN 0.4 MG SL SUBL Sublingual Place 1 tablet (0.4 mg total) under the tongue every 5 (five) minutes  as needed for chest pain. 30 tablet 0  . POLY-IRON 150 PO Oral Take 1 tablet by mouth 2 (two) times daily.      Marland Kitchen POLYVINYL ALCOHOL 1.4 % OP SOLN Both Eyes Place 1 drop into both eyes daily as needed. For dry eyes.    . TORSEMIDE 20 MG PO TABS Oral Take 10 mg by mouth Twice daily.      BP 113/73  Pulse 82  Temp 99 F (37.2 C) (Oral)  Resp 16  Ht 5\' 1"  (1.549 m)  Wt 142 lb (64.411 kg)  BMI 26.83 kg/m2  SpO2 96%  Physical Exam  Nursing note and vitals reviewed. Constitutional: She appears well-developed and well-nourished.  HENT:  Head: Normocephalic.  Eyes: Pupils are equal, round, and reactive to light.  Cardiovascular: Normal rate.   Pulmonary/Chest: Effort normal.  Musculoskeletal: She exhibits tenderness.       Tender right foot,  Red mid foot,  Slight warmth to touch.   No streaks.   Neurological: She is alert.  Skin: There is erythema.  Psychiatric: She has a normal mood and affect.    ED Course  Procedures  (including critical care time)  Labs Reviewed  CBC WITH DIFFERENTIAL - Abnormal; Notable for the following:    Neutrophils Relative 78 (*)     Lymphocytes Relative 11 (*)     All other components within normal limits  BASIC METABOLIC PANEL - Abnormal; Notable for the following:    Potassium 3.4 (*)     Chloride 95 (*)     Glucose, Bld 122 (*)     GFR calc non Af Amer 48 (*)     GFR calc Af Amer 56 (*)     All other components within normal limits  URIC ACID - Abnormal; Notable for the following:    Uric Acid, Serum 9.4 (*)     All other components within normal limits  URINALYSIS, ROUTINE W REFLEX MICROSCOPIC   Dg Foot Complete Right  12/12/2011  *RADIOLOGY REPORT*  Clinical Data: Pain.  Dorsal redness.  RIGHT FOOT COMPLETE - 3+ VIEW  Comparison: 04/16/2011  Findings: Old healed fifth metatarsal fracture. Hammertoe deformities of the second through fifth toes.  No acute fracture, subluxation or dislocation.  Mild osteopenia.  Soft tissues are intact.  IMPRESSION: No acute findings.   Original Report Authenticated By: Cyndie Chime, M.D.      1. Gout       MDM  Uric acid is elevated.   I suspect gout.  No elevation of wbc's urine is normal.   I will treat with prednisone  (I do not want to give NSaids or cholchicine,)  Tylenol for pain.   Pt needs 24 hour recheck       Lonia Skinner [place], [place] 12/12/11 1549  Lonia Skinner [place], [place] 12/12/11 [phone]

## 2011-12-12 NOTE — ED Notes (Signed)
Per Sofia,PA daughter has arranged pt to go to rehab section of [facility]-orders written to place her in rehab section due to inability to walk and that pt needs 24 hour recheck of area of redness-report given via phone to Camille Bal at [facility] rehab

## 2011-12-12 NOTE — ED Notes (Signed)
Pt c/o right "arch" pain in foot since this morning. Pt able to ambulate with walker with pain. Right arch is tender to palpation, ace wrap in place that pt applied. Cms intact.

## 2011-12-13 NOTE — ED Provider Notes (Signed)
Medical screening examination/treatment/procedure(s) were conducted as a shared visit with non-physician practitioner(s) and myself.  I personally evaluated the patient during the encounter    Viriginia Amendola L Leather Estis, MD 12/13/11 1344 

## 2012-05-15 DIAGNOSIS — M25561 Pain in right knee: Secondary | ICD-10-CM

## 2012-05-15 DIAGNOSIS — S9031XA Contusion of right foot, initial encounter: Secondary | ICD-10-CM

## 2012-05-15 HISTORY — DX: Contusion of right foot, initial encounter: S90.31XA

## 2012-05-15 HISTORY — DX: Pain in right knee: M25.561

## 2012-05-17 DIAGNOSIS — R413 Other amnesia: Secondary | ICD-10-CM

## 2012-05-17 HISTORY — DX: Other amnesia: R41.3

## 2012-05-27 ENCOUNTER — Ambulatory Visit (INDEPENDENT_AMBULATORY_CARE_PROVIDER_SITE_OTHER): Payer: Medicare Other | Admitting: Internal Medicine

## 2012-05-27 ENCOUNTER — Encounter: Payer: Self-pay | Admitting: Internal Medicine

## 2012-05-27 VITALS — BP 107/69 | HR 71 | Ht 61.0 in | Wt 143.6 lb

## 2012-05-27 DIAGNOSIS — R002 Palpitations: Secondary | ICD-10-CM

## 2012-05-27 DIAGNOSIS — Z950 Presence of cardiac pacemaker: Secondary | ICD-10-CM

## 2012-05-27 DIAGNOSIS — I5022 Chronic systolic (congestive) heart failure: Secondary | ICD-10-CM

## 2012-05-27 DIAGNOSIS — R634 Abnormal weight loss: Secondary | ICD-10-CM

## 2012-05-27 LAB — PACEMAKER DEVICE OBSERVATION
AL AMPLITUDE: 2.8 mv
AL THRESHOLD: 0.5 V
ATRIAL PACING PM: 36
RV LEAD AMPLITUDE: 31.36 mv
RV LEAD IMPEDENCE PM: 829 Ohm
RV LEAD THRESHOLD: 1 V

## 2012-05-27 NOTE — Assessment & Plan Note (Signed)
Her Medtronic dual-chamber pacemaker is working normally. Plan to recheck in several months. 

## 2012-05-27 NOTE — Assessment & Plan Note (Signed)
I have encouraged the patient to start eating dessert with every meal, and to try to increase her oral intake.

## 2012-05-27 NOTE — Patient Instructions (Addendum)
Your physician wants you to follow-up in: 12 months with Dr Taylor You will receive a reminder letter in the mail two months in advance. If you don't receive a letter, please call our office to schedule the follow-up appointment.   Remote monitoring is used to monitor your Pacemaker of ICD from home. This monitoring reduces the number of office visits required to check your device to one time per year. It allows us to keep an eye on the functioning of your device to ensure it is working properly. You are scheduled for a device check from home on 09/01/12. You may send your transmission at any time that day. If you have a wireless device, the transmission will be sent automatically. After your physician reviews your transmission, you will receive a postcard with your next transmission date.   

## 2012-05-27 NOTE — Assessment & Plan Note (Signed)
Her congestive heart failure symptoms are class 1-2. No change in medical therapy. She is instructed to maintain a low-sodium diet.

## 2012-05-27 NOTE — Progress Notes (Signed)
HPI [name] returns today for followup. She is a very pleasant 79 year old woman with a history of symptomatic bradycardia, status post permanent pacemaker insertion, coronary artery disease, hypertension, and arthritis. Over the last several months, she has lost approximately 20 pounds. She notes that her appetite has been markedly reduced. No peripheral edema. She has chronic knee pain and weakness. Yesterday, she had a few seconds of fleeting chest pain which resolved spontaneously. Allergies  Allergen Reactions  . Neosporin (Neomycin-Bacitracin Zn-Polymyx) Rash     Current Outpatient Prescriptions  Medication Sig Dispense Refill  . alendronate (FOSAMAX) 35 MG tablet Take 35 mg by mouth. Take 35 mg once a week on SundayTake with a full glass of water on an empty stomach.      Marland Kitchen aspirin 325 MG tablet Take 325 mg by mouth daily.      . calcitonin, salmon, (MIACALCIN/FORTICAL) 200 UNIT/ACT nasal spray Place 1 spray into the nose daily as needed. For congestion      . Calcium Citrate-Vitamin D (CITRACAL + D PO) Take 2 tablets by mouth daily.        . carvedilol (COREG) 6.25 MG tablet Take 1 tablet by mouth Twice daily.      Marland Kitchen DETROL LA 4 MG 24 hr capsule Take 4 mg by mouth daily.       . DULoxetine (CYMBALTA) 30 MG capsule Take 30 mg by mouth daily.        . lansoprazole (PREVACID) 15 MG capsule Take 15 mg by mouth daily as needed. For reflux.      . Multiple Vitamin (MULITIVITAMIN WITH MINERALS) TABS Take 1 tablet by mouth daily.      . nitroGLYCERIN (NITROSTAT) 0.4 MG SL tablet Place 1 tablet (0.4 mg total) under the tongue every 5 (five) minutes as needed for chest pain.  30 tablet  0  . Polysaccharide Iron Complex (POLY-IRON 150 PO) Take 1 tablet by mouth 2 (two) times daily.        . predniSONE (DELTASONE) 10 MG tablet 6,5,4,3,2,1 taper  21 tablet  0  . torsemide (DEMADEX) 20 MG tablet Take 10 mg by mouth Twice daily.       No current facility-administered medications for this visit.      Past Medical History  Diagnosis Date  . Dyslipidemia   . Diverticulitis   . HTN (hypertension)   . Osteoporosis   . Spinal stenosis   . Chronic low back pain   . CAD (coronary artery disease)   . Pulmonary embolism   . Nonischemic cardiomyopathy   . Ulcer disease   . H/O: hysterectomy     ROS:   All systems reviewed and negative except as noted in the HPI.   Past Surgical History  Procedure Laterality Date  . Right shoulder repair  1985    Dr. [name]  . Left shoulder replacement  1991    Dr. [name]  . Appendectomy  2010  . Cataract extraction    . Colon resection  2005  . Back surgery    . Rotator cuff debridement  1999    Dr. [name]  . Coronary angioplasty with stent placement  01/2003  . Hemicolectomy       Family History  Problem Relation Age of Onset  . Coronary artery disease Sister   . Heart attack Father 57  . Heart attack Mother   . Other Mother     abdominal blockage     History   Social History  .  Marital Status: Widowed    Spouse Name: N/A    Number of Children: N/A  . Years of Education: N/A   Occupational History  . Not on file.   Social History Main Topics  . Smoking status: Never Smoker   . Smokeless tobacco: Not on file  . Alcohol Use: No  . Drug Use: No  . Sexually Active: Not on file   Other Topics Concern  . Not on file   Social History Narrative  . No narrative on file     BP 107/69  Pulse 71  Ht 5\' 1"  (1.549 m)  Wt 143 lb 9.6 oz (65.137 kg)  BMI 27.15 kg/m2  Physical Exam:  Elderly but Well appearing 79 year old woman,NAD HEENT: Unremarkable Neck:  No JVD, no thyromegally Lungs:  Clear with no wheezes, rales, or rhonchi. HEART:  Regular rate rhythm, no murmurs, no rubs, no clicks Abd:  soft, positive bowel sounds, no organomegally, no rebound, no guarding Ext:  2 plus pulses, no edema, no cyanosis, no clubbing Skin:  No rashes no nodules Neuro:  CN II through XII intact, motor grossly intact  DEVICE   Normal device function.  See PaceArt for details.   Assess/Plan:

## 2012-05-29 ENCOUNTER — Other Ambulatory Visit (HOSPITAL_COMMUNITY): Payer: Self-pay | Admitting: Orthopedic Surgery

## 2012-05-29 DIAGNOSIS — T84032A Mechanical loosening of internal right knee prosthetic joint, initial encounter: Secondary | ICD-10-CM

## 2012-06-03 ENCOUNTER — Non-Acute Institutional Stay (SKILLED_NURSING_FACILITY): Payer: Medicare Other | Admitting: Geriatric Medicine

## 2012-06-03 DIAGNOSIS — R413 Other amnesia: Secondary | ICD-10-CM

## 2012-06-03 DIAGNOSIS — Z5189 Encounter for other specified aftercare: Secondary | ICD-10-CM

## 2012-06-03 DIAGNOSIS — M25569 Pain in unspecified knee: Secondary | ICD-10-CM

## 2012-06-03 DIAGNOSIS — S9031XD Contusion of right foot, subsequent encounter: Secondary | ICD-10-CM

## 2012-06-03 DIAGNOSIS — M25561 Pain in right knee: Secondary | ICD-10-CM

## 2012-06-04 ENCOUNTER — Encounter (HOSPITAL_COMMUNITY)
Admission: RE | Admit: 2012-06-04 | Discharge: 2012-06-04 | Disposition: A | Discharge status: Home / Self Care | Payer: Medicare Other | Source: Ambulatory Visit | Attending: Orthopedic Surgery | Admitting: Orthopedic Surgery

## 2012-06-04 ENCOUNTER — Encounter (HOSPITAL_COMMUNITY): Admission: RE | Admit: 2012-06-04 | Payer: Medicare Other | Source: Ambulatory Visit

## 2012-06-04 ENCOUNTER — Encounter: Payer: Self-pay | Admitting: Geriatric Medicine

## 2012-06-04 DIAGNOSIS — T84032A Mechanical loosening of internal right knee prosthetic joint, initial encounter: Secondary | ICD-10-CM

## 2012-06-04 DIAGNOSIS — M25561 Pain in right knee: Secondary | ICD-10-CM | POA: Insufficient documentation

## 2012-06-04 DIAGNOSIS — Z96659 Presence of unspecified artificial knee joint: Secondary | ICD-10-CM | POA: Insufficient documentation

## 2012-06-04 DIAGNOSIS — R413 Other amnesia: Secondary | ICD-10-CM | POA: Insufficient documentation

## 2012-06-04 DIAGNOSIS — M25569 Pain in unspecified knee: Secondary | ICD-10-CM | POA: Insufficient documentation

## 2012-06-04 MED ORDER — TECHNETIUM TC 99M MEDRONATE IV KIT
25.0000 | PACK | Freq: Once | INTRAVENOUS | Status: AC | PRN
  Administered 2012-06-04: 25 via INTRAVENOUS

## 2012-06-04 NOTE — Progress Notes (Addendum)
While in Rehab section at [facility], Patient noted with STM issues. MMSE 26/30, Clock test intact.  

## 2012-06-04 NOTE — Progress Notes (Deleted)
While in Rehab section at [facility], Patient noted with STM issues. MMSE 26/30, Clock test intact.

## 2012-06-04 NOTE — Progress Notes (Signed)
Subjective:     Patient ID: [name], female   DOB: 03-06-1921, 79 y.o.   MRN: [id number]  HPI  This patient is a resident of [facility], independent living section. She was admitted to the rehabilitation section to a 05/15/12 after a fall in her home the day before. She was experiencing significant right knee pain, was unable to walk safely and unable to care for herself at home. X-ray was negative for any acute changes in her right knee. Right foot and ankle were also significantly reduced with mild swelling. X-ray of ankle and foot was negative for fractures. This was treated as a contusion with compression and elevation for several days with resolution. She continued to have difficulty ambulating, and was evaluated in the orthopedic office.   Right knee pain 06/03/12: Pt evaluated by J.Bissell, PA, confirmed no knee fx, concern for prosthetic loosening. Scheduled Bone scan. Added Voltaren gel. Pt with less knee pian today, ambulating better  Memory disturbance STM issues noted while in Rehab , MMSE 26/30, intact Clock test.      Medication List       These changes are accurate as of: 06/03/2012 11:59 PM. If you have any questions, ask your nurse or doctor.          TAKE these medications       acetaminophen 325 MG tablet  Commonly known as:  TYLENOL  Take 650 mg by mouth every 6 (six) hours as needed for pain (Knee pain).     alendronate 35 MG tablet  Commonly known as:  FOSAMAX  Take 35 mg by mouth. Take 35 mg once a week on SundayTake with a full glass of water on an empty stomach.     aspirin 325 MG tablet  Take 325 mg by mouth daily.     calcitonin (salmon) 200 UNIT/ACT nasal spray  Commonly known as:  MIACALCIN/FORTICAL  Place 1 spray into the nose daily as needed. For congestion     carvedilol 6.25 MG tablet  Commonly known as:  COREG  Take 1 tablet by mouth Twice daily.     CITRACAL + D PO  Take 2 tablets by mouth daily.     DETROL LA 4  MG 24 hr capsule  Generic drug:  tolterodine  Take 4 mg by mouth daily.     diclofenac sodium 1 % Gel  Commonly known as:  VOLTAREN  Apply 4 g topically 4 (four) times daily. Massage into right knee to reduce pain     DULoxetine 30 MG capsule  Commonly known as:  CYMBALTA  Take 30 mg by mouth daily.     lansoprazole 15 MG capsule  Commonly known as:  PREVACID  Take 15 mg by mouth daily as needed. For reflux.     multivitamin with minerals Tabs  Take 1 tablet by mouth daily.     nitroGLYCERIN 0.4 MG SL tablet  Commonly known as:  NITROSTAT  Place 1 tablet (0.4 mg total) under the tongue every 5 (five) minutes as needed for chest pain.     POLY-IRON 150 PO  Take 1 tablet by mouth daily.     predniSONE 10 MG tablet  Commonly known as:  DELTASONE  6,5,4,3,2,1 taper     torsemide 20 MG tablet  Commonly known as:  DEMADEX  Take 10 mg by mouth Twice daily.     vitamin B-12 1000 MCG tablet  Commonly known as:  CYANOCOBALAMIN  Take 1,000 mcg by mouth  daily.          Review of Systems    REVIEW of SYSTEMS:   DATA OBTAINED: from patient, nurse, medical record,   GENERAL: Feels well. No fevers, fatigue, change in activity status. No change in appetite, has had intentional weight loss over last several months. Sleep is OK SKIN: No itch, rash or open wounds EYES: No eye pain, dryness or itching. No change in vision EARS: No earache, tinnitus, change in hearing NOSE: No congestion, drainage or bleeding MOUTH/THROAT: No mouth or tooth pain. No difficulty chewing or swallowing. RESPIRATORY: No cough, wheezing, SOB CARDIAC: No chest pain, palpitations. No edema. GI: No abdominal pain. No N/V/D or constipation. No heartburn or reflux.  GU: No dysuria, frequency or urgency. No nocturia or change in stream. No change in urine volume or character MUSCULOSKELETAL: Rt knee pain. No swelling or stiffness. No back pain. No muscle ache, pain, weakness. Gait is unsteady, using walker.   Marland Kitchen  NEUROLOGIC: No dizziness, fainting, headache, imbalance, numbness. STM issues noted  PSYCHIATRIC: No anxiety, depression, behavior issue. Sleeps well.     Objective:   Physical Exam    PHYSICAL EXAM:   GENERAL APPEARANCE: No acute distress, appropriately groomed, normal body habitus. Alert, pleasant, conversant. HEAD: Normocephalic, atraumatic EYES: Conjunctiva/lids clear. Pupils round, reactive. EOMs intact.  NOSE: No deformity or discharge. MOUTH/THROAT: Lips w/o lesions. Oral mucosa, tongue moist, w/o lesion. Oropharynx w/o redness or lesions.  NECK: Supple, full ROM. No thyroid tenderness, enlargement or nodule LYMPHATICS: No head, neck or supraclavicular adenopathy RESPIRATORY: Breathing is even, unlabored. Lung sounds are clear and full.  CARDIOVASCULAR: Heart RRR. No murmur or extra heart sounds   EDEMA: No peripheral or periorbital edema.  GASTROINTESTINAL: Abdomen is soft, non-tender, not distended w/ normal bowel sounds. No hepatic or splenic enlargement. No mass, ventral or inguinal hernia.   MUSCULOSKELETAL: Moves all extremities with full ROM, strength and tone. Back is without kyphosis, scoliosis or spinal process tenderness. Gait is unsteady, though improved from last week, using walker NEUROLOGIC: Oriented to time, place, person. Cranial nerves 2-12 grossly intact, speech clear, no tremor.  PSYCHIATRIC: Mood and affect appropriate to situation  Assessment:    Knee pain- Improved, continue Voltaren gel. For Bone scan tomorrow Foot contusion- resolved Memory deficit: Mild STM issue. No current functional status problems. Continue to monitor.      Plan:    Discharge to Pt's [place] home at [facility] tomorrow. Continue present medications. Daughter will be visiting/staying with her for several days. Recommend f/u woith Dr.Stoneking 2-3 weeks.

## 2012-06-04 NOTE — Assessment & Plan Note (Signed)
06/03/12: Pt evaluated by J.Bissell, PA, confirmed no knee fx, concern for prosthetic loosening. Scheduled Bone scan. Added Voltaren gel. Pt with less knee pian today, ambulating better

## 2012-06-04 NOTE — Assessment & Plan Note (Signed)
STM issues noted while in Rehab , MMSE 26/30, intact Clock test.

## 2012-09-01 ENCOUNTER — Encounter: Payer: Medicare Other | Admitting: *Deleted

## 2012-09-12 ENCOUNTER — Encounter: Payer: Self-pay | Admitting: *Deleted

## 2012-10-06 ENCOUNTER — Encounter: Payer: Medicare Other | Admitting: *Deleted

## 2012-10-09 ENCOUNTER — Emergency Department (HOSPITAL_COMMUNITY): Payer: Medicare Other

## 2012-10-09 ENCOUNTER — Emergency Department (HOSPITAL_COMMUNITY)
Admission: EM | Admit: 2012-10-09 | Discharge: 2012-10-09 | Disposition: A | Discharge status: Home / Self Care | Payer: Medicare Other | Attending: Emergency Medicine | Admitting: Emergency Medicine

## 2012-10-09 ENCOUNTER — Encounter (HOSPITAL_COMMUNITY): Payer: Self-pay | Admitting: Emergency Medicine

## 2012-10-09 DIAGNOSIS — R0602 Shortness of breath: Secondary | ICD-10-CM | POA: Insufficient documentation

## 2012-10-09 DIAGNOSIS — Z862 Personal history of diseases of the blood and blood-forming organs and certain disorders involving the immune mechanism: Secondary | ICD-10-CM | POA: Insufficient documentation

## 2012-10-09 DIAGNOSIS — Z90710 Acquired absence of both cervix and uterus: Secondary | ICD-10-CM | POA: Insufficient documentation

## 2012-10-09 DIAGNOSIS — G8929 Other chronic pain: Secondary | ICD-10-CM | POA: Insufficient documentation

## 2012-10-09 DIAGNOSIS — Z8639 Personal history of other endocrine, nutritional and metabolic disease: Secondary | ICD-10-CM | POA: Insufficient documentation

## 2012-10-09 DIAGNOSIS — R634 Abnormal weight loss: Secondary | ICD-10-CM | POA: Insufficient documentation

## 2012-10-09 DIAGNOSIS — Z86711 Personal history of pulmonary embolism: Secondary | ICD-10-CM | POA: Insufficient documentation

## 2012-10-09 DIAGNOSIS — M545 Low back pain, unspecified: Secondary | ICD-10-CM | POA: Insufficient documentation

## 2012-10-09 DIAGNOSIS — J40 Bronchitis, not specified as acute or chronic: Secondary | ICD-10-CM

## 2012-10-09 DIAGNOSIS — Z87828 Personal history of other (healed) physical injury and trauma: Secondary | ICD-10-CM | POA: Insufficient documentation

## 2012-10-09 DIAGNOSIS — I10 Essential (primary) hypertension: Secondary | ICD-10-CM | POA: Insufficient documentation

## 2012-10-09 DIAGNOSIS — I2510 Atherosclerotic heart disease of native coronary artery without angina pectoris: Secondary | ICD-10-CM | POA: Insufficient documentation

## 2012-10-09 DIAGNOSIS — R079 Chest pain, unspecified: Secondary | ICD-10-CM

## 2012-10-09 DIAGNOSIS — R413 Other amnesia: Secondary | ICD-10-CM | POA: Insufficient documentation

## 2012-10-09 DIAGNOSIS — Z8739 Personal history of other diseases of the musculoskeletal system and connective tissue: Secondary | ICD-10-CM | POA: Insufficient documentation

## 2012-10-09 DIAGNOSIS — Z9861 Coronary angioplasty status: Secondary | ICD-10-CM | POA: Insufficient documentation

## 2012-10-09 DIAGNOSIS — Z79899 Other long term (current) drug therapy: Secondary | ICD-10-CM | POA: Insufficient documentation

## 2012-10-09 DIAGNOSIS — L98499 Non-pressure chronic ulcer of skin of other sites with unspecified severity: Secondary | ICD-10-CM | POA: Insufficient documentation

## 2012-10-09 DIAGNOSIS — R0789 Other chest pain: Secondary | ICD-10-CM | POA: Insufficient documentation

## 2012-10-09 DIAGNOSIS — Z8719 Personal history of other diseases of the digestive system: Secondary | ICD-10-CM | POA: Insufficient documentation

## 2012-10-09 DIAGNOSIS — Z8679 Personal history of other diseases of the circulatory system: Secondary | ICD-10-CM | POA: Insufficient documentation

## 2012-10-09 DIAGNOSIS — R062 Wheezing: Secondary | ICD-10-CM | POA: Insufficient documentation

## 2012-10-09 LAB — CBC WITH DIFFERENTIAL/PLATELET
Basophils Relative: 0 % (ref 0–1)
Eosinophils Absolute: 0 10*3/uL (ref 0.0–0.7)
Eosinophils Relative: 0 % (ref 0–5)
MCH: 30.5 pg (ref 26.0–34.0)
MCHC: 32.8 g/dL (ref 30.0–36.0)
Neutrophils Relative %: 85 % — ABNORMAL HIGH (ref 43–77)
Platelets: 161 10*3/uL (ref 150–400)

## 2012-10-09 LAB — TROPONIN I: Troponin I: <0.3 ng/mL (ref <0.30)

## 2012-10-09 LAB — POCT I-STAT TROPONIN I

## 2012-10-09 LAB — BASIC METABOLIC PANEL
BUN: 17 mg/dL (ref 6–23)
Calcium: 8.6 mg/dL (ref 8.4–10.5)
Creatinine, Ser: 1.08 mg/dL (ref 0.50–1.10)
GFR calc non Af Amer: 44 mL/min — ABNORMAL LOW (ref >90)
Glucose, Bld: 124 mg/dL — ABNORMAL HIGH (ref 70–99)

## 2012-10-09 MED ORDER — ALBUTEROL (5 MG/ML) CONTINUOUS INHALATION SOLN
10.0000 mg/h | INHALATION_SOLUTION | Freq: Once | RESPIRATORY_TRACT | Status: AC
  Administered 2012-10-09: 10 mg/h via RESPIRATORY_TRACT
  Filled 2012-10-09: qty 20

## 2012-10-09 MED ORDER — IPRATROPIUM BROMIDE 0.02 % IN SOLN
0.5000 mg | Freq: Once | RESPIRATORY_TRACT | Status: AC
  Administered 2012-10-09: 0.5 mg via RESPIRATORY_TRACT
  Filled 2012-10-09: qty 2.5

## 2012-10-09 MED ORDER — ALBUTEROL SULFATE HFA 108 (90 BASE) MCG/ACT IN AERS: 1.0000 | INHALATION_SPRAY | Freq: Four times a day (QID) | RESPIRATORY_TRACT | Status: DC | PRN

## 2012-10-09 MED ORDER — ASPIRIN 81 MG PO CHEW
162.0000 mg | CHEWABLE_TABLET | Freq: Once | ORAL | Status: AC
  Administered 2012-10-09: 162 mg via ORAL
  Filled 2012-10-09: qty 2

## 2012-10-09 MED ORDER — AZITHROMYCIN 250 MG PO TABS: 250.0000 mg | ORAL_TABLET | Freq: Every day | ORAL | Status: DC

## 2012-10-09 NOTE — ED Provider Notes (Signed)
CSN: [id number]     Arrival date & time 10/09/12  1125 History     First MD Initiated Contact with Patient 10/09/12 1135     Chief Complaint  Patient presents with  . Chest Pain   (Consider location/radiation/quality/duration/timing/severity/associated sxs/prior Treatment) HPI Comments: Pt with hx of CAD, PE in 2005, last CT PE on 12/2010 negative for PE, comes in with cc of chest pain. Pt has some chest pain this AM, it was left sided, dull pain, non radiating, that improved with nitro. The pain was unprovoked, and not pleuritic or exertional. There was mild nausea associated with that, no cough, diaphoresis. Pt was noted to have some wheezing by the nursing home and reportedly had received a breathing tx prior to my evaluation. She has no orthopnea, PND, and no chest pain, palpitations, dizziness leading up to today.  Patient is a 79 y.o. female presenting with chest pain. The history is provided by the patient and medical records.  Chest Pain Associated symptoms: shortness of breath   Associated symptoms: no abdominal pain, no cough, no nausea and not vomiting     Past Medical History  Diagnosis Date  . Dyslipidemia   . Diverticulitis   . HTN (hypertension)   . Osteoporosis   . Spinal stenosis   . Chronic low back pain   . CAD (coronary artery disease)   . Pulmonary embolism   . Nonischemic cardiomyopathy   . Ulcer disease   . H/O: hysterectomy   . Right knee pain 05/15/12  . Contusion of foot, right 05/15/12  . Weight loss   . Memory disturbance 05/2012    MMSE 26/30, intact Clock test   Past Surgical History  Procedure Laterality Date  . Right shoulder repair  1985    Dr. [name]  . Left shoulder replacement  1991    Dr. [name]  . Appendectomy  2010  . Cataract extraction    . Colon resection  2005  . Back surgery    . Rotator cuff debridement  1999    Dr. [name]  . Coronary angioplasty with stent placement  01/2003  . Hemicolectomy     Family History  Problem  Relation Age of Onset  . Coronary artery disease Sister   . Heart attack Father 54  . Heart attack Mother   . Other Mother     abdominal blockage   History  Substance Use Topics  . Smoking status: Never Smoker   . Smokeless tobacco: Not on file  . Alcohol Use: No   OB History   Grav Para Term Preterm Abortions TAB SAB Ect Mult Living                 Review of Systems  Constitutional: Negative for activity change.  HENT: Negative for facial swelling and neck pain.   Respiratory: Positive for shortness of breath and wheezing. Negative for cough.   Cardiovascular: Positive for chest pain.  Gastrointestinal: Negative for nausea, vomiting, abdominal pain, diarrhea, constipation, blood in stool and abdominal distention.  Genitourinary: Negative for hematuria and difficulty urinating.  Skin: Negative for color change.  Neurological: Negative for speech difficulty.  Hematological: Does not bruise/bleed easily.  Psychiatric/Behavioral: Negative for confusion.    Allergies  Milk-related compounds  Home Medications   Current Outpatient Rx  Name  Route  Sig  Dispense  Refill  . alendronate (FOSAMAX) 35 MG tablet   Oral   Take 35 mg by mouth every 7 (seven) days. Take with a  full glass of water on an empty stomach.         . carvedilol (COREG) 6.25 MG tablet   Oral   Take 1 tablet by mouth Twice daily.         Marland Kitchen DETROL LA 4 MG 24 hr capsule   Oral   Take 4 mg by mouth daily.          . DULoxetine (CYMBALTA) 30 MG capsule   Oral   Take 30 mg by mouth daily.           . lansoprazole (PREVACID) 15 MG capsule   Oral   Take 15 mg by mouth daily as needed. For reflux.         . nitroGLYCERIN (NITROSTAT) 0.4 MG SL tablet   Sublingual   Place 1 tablet (0.4 mg total) under the tongue every 5 (five) minutes as needed for chest pain.   30 tablet   0   . Polysaccharide Iron Complex (POLY-IRON 150 PO)   Oral   Take 1 tablet by mouth daily.          Marland Kitchen torsemide  (DEMADEX) 20 MG tablet   Oral   Take 10 mg by mouth Twice daily.          BP 97/45  Pulse 96  Temp(Src) 98.6 F (37 C) (Oral)  Resp 23  SpO2 94% Physical Exam  Nursing note and vitals reviewed. Constitutional: She is oriented to person, place, and time. She appears well-developed and well-nourished.  HENT:  Head: Normocephalic and atraumatic.  Eyes: EOM are normal. Pupils are equal, round, and reactive to light.  Neck: Neck supple. JVD present.  Cardiovascular: Normal rate and regular rhythm.   No murmur heard. Pulmonary/Chest: Effort normal. No respiratory distress. She has wheezes. She has no rales.  Abdominal: Soft. She exhibits no distension. There is no tenderness. There is no rebound and no guarding.  Neurological: She is alert and oriented to person, place, and time.  Skin: Skin is warm and dry.    ED Course   Procedures (including critical care time)  Labs Reviewed  BASIC METABOLIC PANEL - Abnormal; Notable for the following:    Glucose, Bld 124 (*)    GFR calc non Af Amer 44 (*)    GFR calc Af Amer 51 (*)    All other components within normal limits  CBC WITH DIFFERENTIAL - Abnormal; Notable for the following:    WBC 14.7 (*)    Hemoglobin 11.9 (*)    Neutrophils Relative % 85 (*)    Neutro Abs 12.5 (*)    Lymphocytes Relative 7 (*)    Monocytes Absolute 1.2 (*)    All other components within normal limits  TROPONIN I   Dg Chest Port 1 View  10/09/2012   *RADIOLOGY REPORT*  Clinical Data: Chest pain  PORTABLE CHEST - 1 VIEW  Comparison: 11/12/2011  Findings: The cardiac shadow is stable.  A hiatal hernia is again noted.  A defibrillator is again seen and stable.  The lungs are incompletely aerated however no focal infiltrate is seen.  IMPRESSION: Poor inspiratory effort.  No acute abnormality is noted.   Original Report Authenticated By: Alcide Clever, M.D.   No diagnosis found.  MDM   Date: 10/09/2012  Rate: 90  Rhythm: normal sinus rhythm  QRS Axis:  left  Intervals: normal  ST/T Wave abnormalities: nonspecific ST/T changes  Conduction Disutrbances:left bundle branch block  Narrative Interpretation:   Old EKG Reviewed:  unchanged  Differential diagnosis includes: ACS syndrome CHF exacerbation COPD exacerbation Valvular disorder Myocarditis Pericarditis Pericardial effusion Pneumonia Pleural effusion Pulmonary edema PE Anemia Musculoskeletal pain  Pt comes in with cc of chest pain, had some wheezing, given albuterol and nitro, and she responded. Pt has been chest pain free here. Her initial EKG is unchanged. We called Cardiology, and they feel comfortable seeing the patient in the clinic for possible cardiac etiology. Pt made aware of that plan. DDX also includes PE, however, this was 1 episode of wheezing, and she is symptom free, no dyspnea no chest pain, no tachycardia, tachypnea, hypoxia. Pre test probability is fairly low for this relatively active patient, with no PE risk factors (besides the obvious hx) - so we discussed the possiblity of PE, the warning signs, and she is to f/u with her pcp or return to the ER if she has any dyspnea, near syncope, wheezing, etc.  Also Cardiology team and i discussed the care, and they feel that a close follow up will be appropriate for the patient. Info on f.u will be provided.   Derwood Kaplan, MD 10/09/12 1654

## 2012-10-09 NOTE — ED Notes (Signed)
Pt brought to ED via [facility] from [facility] independent [facility] facility.  Pt had chest pain this morning- nurse at facility gave pt 2 SL nitro that relieved pt chest pain- CP 0/10 at present.  Left bundle branch block noted on EMS EKG, pt has pacemaker in place.  Pt states she has had a productive cough for the past week and has felt weak.  IV in place.

## 2012-10-17 ENCOUNTER — Ambulatory Visit (INDEPENDENT_AMBULATORY_CARE_PROVIDER_SITE_OTHER): Payer: Medicare Other | Admitting: Cardiology

## 2012-10-17 ENCOUNTER — Encounter: Payer: Self-pay | Admitting: Cardiology

## 2012-10-17 VITALS — BP 119/76 | HR 89 | Ht 61.0 in | Wt 136.4 lb

## 2012-10-17 DIAGNOSIS — I498 Other specified cardiac arrhythmias: Secondary | ICD-10-CM

## 2012-10-17 DIAGNOSIS — R079 Chest pain, unspecified: Secondary | ICD-10-CM

## 2012-10-17 DIAGNOSIS — Z950 Presence of cardiac pacemaker: Secondary | ICD-10-CM

## 2012-10-17 DIAGNOSIS — I2510 Atherosclerotic heart disease of native coronary artery without angina pectoris: Secondary | ICD-10-CM

## 2012-10-17 DIAGNOSIS — R001 Bradycardia, unspecified: Secondary | ICD-10-CM

## 2012-10-17 DIAGNOSIS — I5022 Chronic systolic (congestive) heart failure: Secondary | ICD-10-CM

## 2012-10-17 DIAGNOSIS — I428 Other cardiomyopathies: Secondary | ICD-10-CM

## 2012-10-17 LAB — PACEMAKER DEVICE OBSERVATION
AL IMPEDENCE PM: 345 Ohm
BATTERY VOLTAGE: 2.78 V
RV LEAD AMPLITUDE: 31.36 mv

## 2012-10-17 NOTE — Patient Instructions (Signed)
Your physician recommends that you schedule a follow-up appointment in: MARCH 2015 WITH DR. Ladona Ridgel

## 2012-10-17 NOTE — Progress Notes (Signed)
ELECTROPHYSIOLOGY OFFICE NOTE  Patient ID: [name] MRN: [id number], DOB/AGE: 77/05/1920   Date of Visit: 10/17/2012  Primary Physician: Ginette Otto, MD Primary Cardiologist: previously Dr. [name]; most recently Dr. Lewayne Bunting Reason for Visit: Chest pain; ED follow-up  History of Present Illness [name] is a 79 y.o. female with known CAD s/p PCI in 2004, symptomatic bradycardia s/p PPM implant and HTN who presents today for follow-up after recent [facility] ED visit for chest pain. She is accompanied by her daughter who assists with history questions. [name] and her daughter report that she woke up coughing forcefully, so much so she thought she was going to vomit. She also had chest tightness and SOB. She was found to have wheezing on exam. She was given albuterol with improvement. Also in the ED she had a low grade fever and mildly elevated WBCs. Since she was seen in the ED, she was diagnosed with acute bronchitis. She was given albuterol and Zithromax and just completed a 5-day course. She is feeling better, back to her usual self. She denies chest pain or shortness of breath. She denies palpitations, dizziness, near syncope or syncope. She denies LE swelling, orthopnea, PND or recent weight gain. She has no exertional CP or SOB. She tries to remain active at the ALF where she resides and states she has no difficulty or limitation with ambulation or ADLs. She is compliant and tolerating medications without difficulty.  Past Medical History Past Medical History  Diagnosis Date  . Dyslipidemia   . Diverticulitis   . HTN (hypertension)   . Osteoporosis   . Spinal stenosis   . Chronic low back pain   . CAD (coronary artery disease)   . Pulmonary embolism   . Nonischemic cardiomyopathy   . Ulcer disease   . H/O: hysterectomy   . Right knee pain 05/15/12  . Contusion of foot, right 05/15/12  . Weight loss   . Memory disturbance 05/2012    MMSE 26/30, intact  Clock test    Past Surgical History Past Surgical History  Procedure Laterality Date  . Right shoulder repair  1985    Dr. [name]  . Left shoulder replacement  1991    Dr. [name]  . Appendectomy  2010  . Cataract extraction    . Colon resection  2005  . Back surgery    . Rotator cuff debridement  1999    Dr. [name]  . Coronary angioplasty with stent placement  01/2003  . Hemicolectomy      Allergies/Intolerances Allergies  Allergen Reactions  . Milk-Related Compounds Nausea And Vomiting   Current Home Medications Current Outpatient Prescriptions  Medication Sig Dispense Refill  . albuterol (PROVENTIL HFA;VENTOLIN HFA) 108 (90 BASE) MCG/ACT inhaler Inhale 1-2 puffs into the lungs every 6 (six) hours as needed for wheezing.  1 Inhaler  0  . alendronate (FOSAMAX) 35 MG tablet Take 35 mg by mouth every 7 (seven) days. Take with a full glass of water on an empty stomach.      . carvedilol (COREG) 6.25 MG tablet Take 1 tablet by mouth Twice daily.      Marland Kitchen DETROL LA 4 MG 24 hr capsule Take 4 mg by mouth daily.       . DULoxetine (CYMBALTA) 30 MG capsule Take 30 mg by mouth daily.        . lansoprazole (PREVACID) 15 MG capsule Take 15 mg by mouth daily as needed. For reflux.      Marland Kitchen  nitroGLYCERIN (NITROSTAT) 0.4 MG SL tablet Place 1 tablet (0.4 mg total) under the tongue every 5 (five) minutes as needed for chest pain.  30 tablet  0  . Polysaccharide Iron Complex (POLY-IRON 150 PO) Take 1 tablet by mouth daily.       Marland Kitchen torsemide (DEMADEX) 20 MG tablet Take 10 mg by mouth Twice daily.       No current facility-administered medications for this visit.   Social History History   Social History  . Marital Status: Widowed    Spouse Name: N/A    Number of Children: N/A  . Years of Education: N/A   Occupational History  . Not on file.   Social History Main Topics  . Smoking status: Never Smoker   . Smokeless tobacco: Not on file  . Alcohol Use: No  . Drug Use: No  . Sexually Active:  Not on file   Other Topics Concern  . Not on file   Social History Narrative  . No narrative on file    Review of Systems General: No chills, fever, night sweats or weight changes Cardiovascular: No chest pain, dyspnea on exertion, edema, orthopnea, palpitations, paroxysmal nocturnal dyspnea Dermatological: No rash, lesions or masses Respiratory: No cough, dyspnea Urologic: No hematuria, dysuria Abdominal: No nausea, vomiting, diarrhea, bright red blood per rectum, melena, or hematemesis Neurologic: No visual changes, weakness, changes in mental status All other systems reviewed and are otherwise negative except as noted above.  Physical Exam Vitals: Blood pressure 119/76, pulse 89, height 5\' 1"  (1.549 m), weight 136 lb 6.4 oz (61.871 kg).  General: Well developed, well appearing, elderly 79 y.o. female in no acute distress. HEENT: Normocephalic, atraumatic. EOMs intact. Sclera nonicteric. Oropharynx clear.  Neck: Supple. No JVD. Lungs: Respirations regular and unlabored, CTA bilaterally. No wheezes, rales or rhonchi. Heart: RRR. S1, S2 present. No murmurs, rub, S3 or S4. Abdomen: Soft, non-distended.  Extremities: No clubbing, cyanosis or edema. PT/Radials 2+ and equal bilaterally. Psych: Normal affect. Neuro: Alert and oriented X 3. Moves all extremities spontaneously.   Diagnostics Device interrogation today - Normal device function. Thresholds, sensing, impedances consistent with previous measurements. Device programmed to maximize longevity. 118 mode switch episodes, <0.1% of time, longest 14 minutes. No high ventricular rates noted. Device programmed at appropriate safety margins. Histogram distribution appropriate for patient activity level. Device programmed to optimize intrinsic conduction. Estimated longevity 10 years.  Assessment and Plan 1. Chest pain  - resolved  - likely related to acute bronchitis  - has no exertional symptoms 2. CAD  - stable without anginal  symptoms  - continue medical therapy 3. NICM with chronic systolic HF  - EF 25% by cardiac MRI 2007  - euvolemic on exam  - continue medical therapy 4. Symptomatic bradycardia s/p PPM implant  - normal device function  - no programming changes made  - continue routine remote PPM follow-up every 3 months  - return for follow-up with Dr. Ladona Ridgel in March 2014  Signed, Rick Duff, PA-C 10/17/2012, 4:47 PM

## 2013-01-07 ENCOUNTER — Encounter: Payer: Self-pay | Admitting: Internal Medicine

## 2013-05-29 ENCOUNTER — Encounter: Payer: Medicare Other | Admitting: Internal Medicine

## 2013-05-30 ENCOUNTER — Encounter: Payer: Self-pay | Admitting: *Deleted

## 2013-06-24 ENCOUNTER — Ambulatory Visit (INDEPENDENT_AMBULATORY_CARE_PROVIDER_SITE_OTHER): Payer: Medicare Other | Admitting: Internal Medicine

## 2013-06-24 ENCOUNTER — Encounter: Payer: Self-pay | Admitting: Internal Medicine

## 2013-06-24 VITALS — BP 110/72 | HR 106 | Ht 62.0 in | Wt 145.2 lb

## 2013-06-24 DIAGNOSIS — Z950 Presence of cardiac pacemaker: Secondary | ICD-10-CM

## 2013-06-24 DIAGNOSIS — R001 Bradycardia, unspecified: Secondary | ICD-10-CM

## 2013-06-24 DIAGNOSIS — I498 Other specified cardiac arrhythmias: Secondary | ICD-10-CM

## 2013-06-24 DIAGNOSIS — I5022 Chronic systolic (congestive) heart failure: Secondary | ICD-10-CM

## 2013-06-24 LAB — MDC_IDC_ENUM_SESS_TYPE_INCLINIC
Brady Statistic AP VP Percent: 15 %
Brady Statistic AP VS Percent: 10 %
Brady Statistic AS VP Percent: 57 %
Brady Statistic AS VS Percent: 19 %
Lead Channel Impedance Value: 848 Ohm
Lead Channel Pacing Threshold Amplitude: 0.5 V
Lead Channel Pacing Threshold Pulse Width: 0.4 ms
Lead Channel Sensing Intrinsic Amplitude: 2.8 mV
Lead Channel Sensing Intrinsic Amplitude: 22.4 mV
Lead Channel Setting Pacing Amplitude: 2 V
MDC IDC MSMT BATTERY IMPEDANCE: 177 Ohm
MDC IDC MSMT BATTERY REMAINING LONGEVITY: 111 mo
MDC IDC MSMT BATTERY VOLTAGE: 2.78 V
MDC IDC MSMT LEADCHNL RA IMPEDANCE VALUE: 415 Ohm
MDC IDC MSMT LEADCHNL RV PACING THRESHOLD AMPLITUDE: 0.5 V
MDC IDC MSMT LEADCHNL RV PACING THRESHOLD PULSEWIDTH: 0.4 ms
MDC IDC SESS DTM: 20150408203913
MDC IDC SET LEADCHNL RV PACING AMPLITUDE: 2.5 V
MDC IDC SET LEADCHNL RV PACING PULSEWIDTH: 0.4 ms
MDC IDC SET LEADCHNL RV SENSING SENSITIVITY: 5.6 mV

## 2013-06-24 NOTE — Progress Notes (Signed)
HPI [name] returns today for followup. She is a very pleasant 80 year old woman with a history of symptomatic bradycardia, status post permanent pacemaker insertion, coronary artery disease, hypertension, and arthritis.  No peripheral edema. She has chronic knee pain and weakness. She has moved into the assisted living section of [facility]. No chest pain or sob. Minimal peripheral edema. Allergies  Allergen Reactions  . Arthrotec [Diclofenac-Misoprostol]   . Ibuprofen   . Lactose Intolerance (Gi)   . Milk-Related Compounds Nausea And Vomiting  . Other     Grass and ragweed  . Oxycontin [Oxycodone Hcl]   . Pollen Extract      Current Outpatient Prescriptions  Medication Sig Dispense Refill  . alendronate (FOSAMAX) 35 MG tablet Take 35 mg by mouth every 7 (seven) days. Take with a full glass of water on an empty stomach.      . carvedilol (COREG) 6.25 MG tablet Take 1 tablet by mouth Twice daily.      . colchicine (COLCRYS) 0.6 MG tablet Take 1 tablet 1-2 times a day for gout flare up      . cyanocobalamin 1000 MCG tablet Take 100 mcg by mouth daily.      Marland Kitchen DETROL LA 4 MG 24 hr capsule Take 4 mg by mouth daily.       . DULoxetine (CYMBALTA) 30 MG capsule Take 30 mg by mouth daily.        . lansoprazole (PREVACID) 15 MG capsule Take 15 mg by mouth every morning. For reflux.      . lidocaine (LIDODERM) 5 % Place 1 patch onto the skin every morning. Remove & Discard patch within 12 hours or as directed by MD      . nitroGLYCERIN (NITROSTAT) 0.4 MG SL tablet Place 0.4 mg under the tongue every 5 (five) minutes as needed for chest pain.      . Polysaccharide Iron Complex (POLY-IRON 150 PO) Take 1 tablet by mouth daily.       Marland Kitchen torsemide (DEMADEX) 20 MG tablet Take 10 mg by mouth every other day.       . albuterol (PROVENTIL HFA;VENTOLIN HFA) 108 (90 BASE) MCG/ACT inhaler Inhale 1-2 puffs into the lungs every 6 (six) hours as needed for wheezing.  1 Inhaler  0   No current  facility-administered medications for this visit.     Past Medical History  Diagnosis Date  . Dyslipidemia   . Diverticulitis   . HTN (hypertension)   . Osteoporosis   . Spinal stenosis   . Chronic low back pain   . CAD (coronary artery disease)   . Pulmonary embolism   . Nonischemic cardiomyopathy   . Ulcer disease   . H/O: hysterectomy   . Right knee pain 05/15/12  . Contusion of foot, right 05/15/12  . Weight loss   . Memory disturbance 05/2012    MMSE 26/30, intact Clock test    ROS:   All systems reviewed and negative except as noted in the HPI.   Past Surgical History  Procedure Laterality Date  . Right shoulder repair  1985    Dr. [name]  . Left shoulder replacement  1991    Dr. [name]  . Appendectomy  2010  . Cataract extraction    . Colon resection  2005  . Back surgery    . Rotator cuff debridement  1999    Dr. [name]  . Coronary angioplasty with stent placement  01/2003  . Hemicolectomy  Family History  Problem Relation Age of Onset  . Coronary artery disease Sister   . Heart attack Father [id number]  . Heart attack Mother   . Other Mother     abdominal blockage     History   Social History  . Marital Status: Widowed    Spouse Name: N/A    Number of Children: N/A  . Years of Education: N/A   Occupational History  . Not on file.   Social History Main Topics  . Smoking status: Never Smoker   . Smokeless tobacco: Not on file  . Alcohol Use: No  . Drug Use: No  . Sexual Activity: Not on file   Other Topics Concern  . Not on file   Social History Narrative  . No narrative on file     BP 110/72  Pulse 106  Ht 5\' 2"  (1.575 m)  Wt 145 lb 3.2 oz (65.862 kg)  BMI 26.55 kg/m2  Physical Exam:  Elderly but Well appearing 80 year old woman,NAD HEENT: Unremarkable Neck:  No JVD, no thyromegally Lungs:  Clear with no wheezes, rales, or rhonchi. HEART:  Regular rate rhythm, no murmurs, no rubs, no clicks Abd:  soft, positive bowel sounds,  no organomegally, no rebound, no guarding Ext:  2 plus pulses, trace peripheral edema, no cyanosis, no clubbing Skin:  No rashes no nodules Neuro:  CN II through XII intact, motor grossly intact  DEVICE  Normal device function.  See PaceArt for details.   Assess/Plan:

## 2013-06-24 NOTE — Assessment & Plan Note (Signed)
Her chronic systolic heart failure is class 2. She will continue her current meds.

## 2013-06-24 NOTE — Patient Instructions (Signed)
Remote monitoring is used to monitor your pacemaker from home. This monitoring reduces the number of office visits required to check your device to one time per year. It allows [place] to keep an eye on the functioning of your device to ensure it is working properly. You are scheduled for a device check from home on 09-28-2013. You may send your transmission at any time that day. If you have a wireless device, the transmission will be sent automatically. After your physician reviews your transmission, you will receive a postcard with your next transmission date.  Your physician recommends that you schedule a follow-up appointment in: 12 months with Dr.Taylor

## 2013-06-24 NOTE — Assessment & Plan Note (Signed)
Her Medtronic DDD PM is working normally. Will recheck in several months. 

## 2013-09-28 ENCOUNTER — Encounter: Payer: Medicare Other | Admitting: *Deleted

## 2013-09-29 ENCOUNTER — Telehealth: Payer: Self-pay | Admitting: Cardiology

## 2013-09-29 NOTE — Telephone Encounter (Signed)
Called to confirm remote transmission on Monday with pt daughter. Pt daughter said that she attempted to send transmission multiple times yesterday and that her batteries actually died while trying to do this. I informed pt daughter in law to call tech support number. Pt daughter verbalized understanding.

## 2013-09-30 ENCOUNTER — Encounter: Payer: Self-pay | Admitting: Cardiology

## 2013-10-02 ENCOUNTER — Ambulatory Visit (INDEPENDENT_AMBULATORY_CARE_PROVIDER_SITE_OTHER): Payer: Medicare Other | Admitting: *Deleted

## 2013-10-02 DIAGNOSIS — I428 Other cardiomyopathies: Secondary | ICD-10-CM

## 2013-10-03 ENCOUNTER — Encounter: Payer: Self-pay | Admitting: Internal Medicine

## 2013-10-03 ENCOUNTER — Other Ambulatory Visit: Payer: Self-pay | Admitting: Internal Medicine

## 2013-10-05 NOTE — Progress Notes (Signed)
Remote pacemaker transmission.   

## 2013-10-09 LAB — MDC_IDC_ENUM_SESS_TYPE_REMOTE
Battery Impedance: 202 Ohm
Battery Remaining Longevity: 115 mo
Brady Statistic AP VP Percent: 19 %
Lead Channel Impedance Value: 426 Ohm
Lead Channel Pacing Threshold Pulse Width: 0.4 ms
Lead Channel Sensing Intrinsic Amplitude: 1 mV
Lead Channel Setting Pacing Amplitude: 2 V
Lead Channel Setting Pacing Pulse Width: 0.4 ms
MDC IDC MSMT BATTERY VOLTAGE: 2.78 V
MDC IDC MSMT LEADCHNL RA PACING THRESHOLD AMPLITUDE: 0.625 V
MDC IDC MSMT LEADCHNL RA PACING THRESHOLD PULSEWIDTH: 0.4 ms
MDC IDC MSMT LEADCHNL RV IMPEDANCE VALUE: 781 Ohm
MDC IDC MSMT LEADCHNL RV PACING THRESHOLD AMPLITUDE: 0.75 V
MDC IDC MSMT LEADCHNL RV SENSING INTR AMPL: 16 mV
MDC IDC SESS DTM: 20150718111843
MDC IDC SET LEADCHNL RV PACING AMPLITUDE: 2.5 V
MDC IDC SET LEADCHNL RV SENSING SENSITIVITY: 5.6 mV
MDC IDC STAT BRADY AP VS PERCENT: 2 %
MDC IDC STAT BRADY AS VP PERCENT: 9 %
MDC IDC STAT BRADY AS VS PERCENT: 70 %

## 2013-10-23 ENCOUNTER — Encounter: Payer: Self-pay | Admitting: Cardiology

## 2014-01-07 ENCOUNTER — Ambulatory Visit (INDEPENDENT_AMBULATORY_CARE_PROVIDER_SITE_OTHER): Payer: Medicare Other | Admitting: *Deleted

## 2014-01-07 ENCOUNTER — Encounter: Payer: Self-pay | Admitting: Internal Medicine

## 2014-01-07 ENCOUNTER — Telehealth: Payer: Self-pay | Admitting: Cardiology

## 2014-01-07 DIAGNOSIS — I429 Cardiomyopathy, unspecified: Secondary | ICD-10-CM

## 2014-01-07 DIAGNOSIS — I428 Other cardiomyopathies: Secondary | ICD-10-CM

## 2014-01-07 LAB — MDC_IDC_ENUM_SESS_TYPE_REMOTE
Battery Impedance: 202 Ohm
Brady Statistic AP VP Percent: 19 %
Brady Statistic AP VS Percent: 3 %
Brady Statistic AS VP Percent: 11 %
Brady Statistic AS VS Percent: 67 %
Date Time Interrogation Session: 20151022205017
Lead Channel Impedance Value: 779 Ohm
Lead Channel Pacing Threshold Pulse Width: 0.4 ms
Lead Channel Sensing Intrinsic Amplitude: 16 mV
Lead Channel Sensing Intrinsic Amplitude: 2.8 mV
Lead Channel Setting Pacing Amplitude: 2 V
MDC IDC MSMT BATTERY REMAINING LONGEVITY: 114 mo
MDC IDC MSMT BATTERY VOLTAGE: 2.78 V
MDC IDC MSMT LEADCHNL RA IMPEDANCE VALUE: 420 Ohm
MDC IDC MSMT LEADCHNL RA PACING THRESHOLD AMPLITUDE: 0.625 V
MDC IDC MSMT LEADCHNL RV PACING THRESHOLD AMPLITUDE: 0.75 V
MDC IDC MSMT LEADCHNL RV PACING THRESHOLD PULSEWIDTH: 0.4 ms
MDC IDC SET LEADCHNL RV PACING AMPLITUDE: 2.5 V
MDC IDC SET LEADCHNL RV PACING PULSEWIDTH: 0.4 ms
MDC IDC SET LEADCHNL RV SENSING SENSITIVITY: 5.6 mV

## 2014-01-07 NOTE — Telephone Encounter (Signed)
Spoke with pt and reminded pt of remote transmission that is due today. Pt verbalized understanding.   

## 2014-01-08 NOTE — Progress Notes (Signed)
Remote pacemaker transmission.   

## 2014-02-05 ENCOUNTER — Encounter: Payer: Self-pay | Admitting: Cardiology

## 2014-02-25 ENCOUNTER — Encounter (HOSPITAL_COMMUNITY): Payer: Self-pay | Admitting: Internal Medicine

## 2014-04-04 ENCOUNTER — Inpatient Hospital Stay (HOSPITAL_COMMUNITY)
Admission: EM | Admit: 2014-04-04 | Discharge: 2014-04-07 | DRG: 470 | Disposition: A | Discharge status: Skilled Nursing Facility | Payer: Medicare Other | Attending: Internal Medicine | Admitting: Internal Medicine

## 2014-04-04 ENCOUNTER — Emergency Department (HOSPITAL_COMMUNITY): Payer: Medicare Other

## 2014-04-04 ENCOUNTER — Encounter (HOSPITAL_COMMUNITY): Payer: Self-pay | Admitting: *Deleted

## 2014-04-04 DIAGNOSIS — Z96642 Presence of left artificial hip joint: Secondary | ICD-10-CM

## 2014-04-04 DIAGNOSIS — M25552 Pain in left hip: Secondary | ICD-10-CM | POA: Diagnosis not present

## 2014-04-04 DIAGNOSIS — Y92099 Unspecified place in other non-institutional residence as the place of occurrence of the external cause: Secondary | ICD-10-CM

## 2014-04-04 DIAGNOSIS — S72012A Unspecified intracapsular fracture of left femur, initial encounter for closed fracture: Secondary | ICD-10-CM | POA: Diagnosis not present

## 2014-04-04 DIAGNOSIS — S72009A Fracture of unspecified part of neck of unspecified femur, initial encounter for closed fracture: Secondary | ICD-10-CM | POA: Diagnosis present

## 2014-04-04 DIAGNOSIS — I5022 Chronic systolic (congestive) heart failure: Secondary | ICD-10-CM | POA: Diagnosis present

## 2014-04-04 DIAGNOSIS — I10 Essential (primary) hypertension: Secondary | ICD-10-CM | POA: Diagnosis present

## 2014-04-04 DIAGNOSIS — M810 Age-related osteoporosis without current pathological fracture: Secondary | ICD-10-CM | POA: Diagnosis present

## 2014-04-04 DIAGNOSIS — Z66 Do not resuscitate: Secondary | ICD-10-CM | POA: Diagnosis present

## 2014-04-04 DIAGNOSIS — R52 Pain, unspecified: Secondary | ICD-10-CM

## 2014-04-04 DIAGNOSIS — I447 Left bundle-branch block, unspecified: Secondary | ICD-10-CM | POA: Diagnosis present

## 2014-04-04 DIAGNOSIS — Z950 Presence of cardiac pacemaker: Secondary | ICD-10-CM

## 2014-04-04 DIAGNOSIS — F0390 Unspecified dementia without behavioral disturbance: Secondary | ICD-10-CM | POA: Diagnosis present

## 2014-04-04 DIAGNOSIS — W010XXA Fall on same level from slipping, tripping and stumbling without subsequent striking against object, initial encounter: Secondary | ICD-10-CM | POA: Diagnosis present

## 2014-04-04 DIAGNOSIS — E785 Hyperlipidemia, unspecified: Secondary | ICD-10-CM | POA: Diagnosis present

## 2014-04-04 DIAGNOSIS — S72032A Displaced midcervical fracture of left femur, initial encounter for closed fracture: Secondary | ICD-10-CM | POA: Diagnosis present

## 2014-04-04 DIAGNOSIS — Z01810 Encounter for preprocedural cardiovascular examination: Secondary | ICD-10-CM

## 2014-04-04 DIAGNOSIS — Z79899 Other long term (current) drug therapy: Secondary | ICD-10-CM

## 2014-04-04 DIAGNOSIS — D7289 Other specified disorders of white blood cells: Secondary | ICD-10-CM

## 2014-04-04 DIAGNOSIS — Z96612 Presence of left artificial shoulder joint: Secondary | ICD-10-CM | POA: Diagnosis present

## 2014-04-04 DIAGNOSIS — I429 Cardiomyopathy, unspecified: Secondary | ICD-10-CM | POA: Diagnosis present

## 2014-04-04 DIAGNOSIS — Z86711 Personal history of pulmonary embolism: Secondary | ICD-10-CM

## 2014-04-04 DIAGNOSIS — I2510 Atherosclerotic heart disease of native coronary artery without angina pectoris: Secondary | ICD-10-CM | POA: Diagnosis present

## 2014-04-04 DIAGNOSIS — W19XXXA Unspecified fall, initial encounter: Secondary | ICD-10-CM

## 2014-04-04 DIAGNOSIS — S72002A Fracture of unspecified part of neck of left femur, initial encounter for closed fracture: Secondary | ICD-10-CM

## 2014-04-04 LAB — CBC WITH DIFFERENTIAL/PLATELET
BASOS ABS: 0.1 10*3/uL (ref 0.0–0.1)
BASOS PCT: 1 % (ref 0–1)
EOS ABS: 0.2 10*3/uL (ref 0.0–0.7)
Eosinophils Relative: 2 % (ref 0–5)
HEMATOCRIT: 39.4 % (ref 36.0–46.0)
Hemoglobin: 12.9 g/dL (ref 12.0–15.0)
LYMPHS ABS: 1.5 10*3/uL (ref 0.7–4.0)
LYMPHS PCT: 14 % (ref 12–46)
MCH: 30.3 pg (ref 26.0–34.0)
MCHC: 32.7 g/dL (ref 30.0–36.0)
MCV: 92.5 fL (ref 78.0–100.0)
MONO ABS: 0.8 10*3/uL (ref 0.1–1.0)
MONOS PCT: 7 % (ref 3–12)
NEUTROS ABS: 8.5 10*3/uL — AB (ref 1.7–7.7)
NEUTROS PCT: 76 % (ref 43–77)
Platelets: 207 10*3/uL (ref 150–400)
RBC: 4.26 MIL/uL (ref 3.87–5.11)
RDW: 13.2 % (ref 11.5–15.5)
WBC: 11 10*3/uL — ABNORMAL HIGH (ref 4.0–10.5)

## 2014-04-04 LAB — URINE MICROSCOPIC-ADD ON

## 2014-04-04 LAB — CBC
HCT: 38.9 % (ref 36.0–46.0)
HEMOGLOBIN: 12.7 g/dL (ref 12.0–15.0)
MCH: 30.4 pg (ref 26.0–34.0)
MCHC: 32.6 g/dL (ref 30.0–36.0)
MCV: 93.1 fL (ref 78.0–100.0)
Platelets: 222 10*3/uL (ref 150–400)
RBC: 4.18 MIL/uL (ref 3.87–5.11)
RDW: 13.2 % (ref 11.5–15.5)
WBC: 13.6 10*3/uL — ABNORMAL HIGH (ref 4.0–10.5)

## 2014-04-04 LAB — COMPREHENSIVE METABOLIC PANEL
ALT: 11 U/L (ref 0–35)
AST: 20 U/L (ref 0–37)
Albumin: 3.7 g/dL (ref 3.5–5.2)
Alkaline Phosphatase: 70 U/L (ref 39–117)
Anion gap: 5 (ref 5–15)
BILIRUBIN TOTAL: 0.7 mg/dL (ref 0.3–1.2)
BUN: 15 mg/dL (ref 6–23)
CHLORIDE: 104 meq/L (ref 96–112)
CO2: 30 mmol/L (ref 19–32)
Calcium: 8.3 mg/dL — ABNORMAL LOW (ref 8.4–10.5)
Creatinine, Ser: 0.84 mg/dL (ref 0.50–1.10)
GFR calc Af Amer: 68 mL/min — ABNORMAL LOW (ref >90)
GFR calc non Af Amer: 59 mL/min — ABNORMAL LOW (ref >90)
GLUCOSE: 108 mg/dL — AB (ref 70–99)
POTASSIUM: 3.6 mmol/L (ref 3.5–5.1)
SODIUM: 139 mmol/L (ref 135–145)
Total Protein: 6.5 g/dL (ref 6.0–8.3)

## 2014-04-04 LAB — CREATININE, SERUM
Creatinine, Ser: 0.7 mg/dL (ref 0.50–1.10)
GFR calc non Af Amer: 73 mL/min — ABNORMAL LOW (ref >90)
GFR, EST AFRICAN AMERICAN: 85 mL/min — AB (ref >90)

## 2014-04-04 LAB — URINALYSIS, ROUTINE W REFLEX MICROSCOPIC
BILIRUBIN URINE: NEGATIVE
GLUCOSE, UA: NEGATIVE mg/dL
Hgb urine dipstick: NEGATIVE
KETONES UR: NEGATIVE mg/dL
Nitrite: NEGATIVE
Protein, ur: NEGATIVE mg/dL
Specific Gravity, Urine: 1.008 (ref 1.005–1.030)
Urobilinogen, UA: 0.2 mg/dL (ref 0.0–1.0)
pH: 5.5 (ref 5.0–8.0)

## 2014-04-04 LAB — TROPONIN I: Troponin I: <0.03 ng/mL (ref <0.031)

## 2014-04-04 MED ORDER — NITROGLYCERIN 0.4 MG SL SUBL: 0.4000 mg | SUBLINGUAL_TABLET | SUBLINGUAL | Status: DC | PRN

## 2014-04-04 MED ORDER — FESOTERODINE FUMARATE ER 8 MG PO TB24
8.0000 mg | ORAL_TABLET | Freq: Every day | ORAL | Status: DC
  Administered 2014-04-04 – 2014-04-07 (×4): 8 mg via ORAL
  Filled 2014-04-04 (×4): qty 1

## 2014-04-04 MED ORDER — CARVEDILOL 6.25 MG PO TABS
6.2500 mg | ORAL_TABLET | Freq: Two times a day (BID) | ORAL | Status: DC
  Administered 2014-04-05 – 2014-04-07 (×4): 6.25 mg via ORAL
  Filled 2014-04-04 (×4): qty 1

## 2014-04-04 MED ORDER — DULOXETINE HCL 30 MG PO CPEP
30.0000 mg | ORAL_CAPSULE | Freq: Every day | ORAL | Status: DC
  Administered 2014-04-05 – 2014-04-07 (×3): 30 mg via ORAL
  Filled 2014-04-04 (×3): qty 1

## 2014-04-04 MED ORDER — SODIUM CHLORIDE 0.9 % IV SOLN
INTRAVENOUS | Status: DC
  Administered 2014-04-04: 23:00:00 via INTRAVENOUS

## 2014-04-04 MED ORDER — HEPARIN SODIUM (PORCINE) 5000 UNIT/ML IJ SOLN
5000.0000 [IU] | Freq: Three times a day (TID) | INTRAMUSCULAR | Status: DC
  Administered 2014-04-04 – 2014-04-05 (×2): 5000 [IU] via SUBCUTANEOUS
  Filled 2014-04-04 (×4): qty 1

## 2014-04-04 MED ORDER — SODIUM CHLORIDE 0.9 % IJ SOLN
3.0000 mL | Freq: Two times a day (BID) | INTRAMUSCULAR | Status: DC
  Administered 2014-04-04 – 2014-04-06 (×3): 3 mL via INTRAVENOUS

## 2014-04-04 MED ORDER — CETYLPYRIDINIUM CHLORIDE 0.05 % MT LIQD
7.0000 mL | Freq: Two times a day (BID) | OROMUCOSAL | Status: DC
  Administered 2014-04-04 – 2014-04-07 (×6): 7 mL via OROMUCOSAL

## 2014-04-04 MED ORDER — FEBUXOSTAT 40 MG PO TABS
40.0000 mg | ORAL_TABLET | Freq: Every day | ORAL | Status: DC
  Administered 2014-04-05 – 2014-04-07 (×3): 40 mg via ORAL
  Filled 2014-04-04 (×3): qty 1

## 2014-04-04 MED ORDER — ALENDRONATE SODIUM 35 MG PO TABS: 35.0000 mg | ORAL_TABLET | ORAL | Status: DC

## 2014-04-04 MED ORDER — PANTOPRAZOLE SODIUM 40 MG PO TBEC
40.0000 mg | DELAYED_RELEASE_TABLET | Freq: Every day | ORAL | Status: DC
  Administered 2014-04-04 – 2014-04-07 (×4): 40 mg via ORAL
  Filled 2014-04-04 (×4): qty 1

## 2014-04-04 MED ORDER — TORSEMIDE 10 MG PO TABS
10.0000 mg | ORAL_TABLET | ORAL | Status: DC
  Administered 2014-04-06: 10 mg via ORAL
  Filled 2014-04-04: qty 1

## 2014-04-04 MED ORDER — ONDANSETRON HCL 4 MG/2ML IJ SOLN: 4.0000 mg | Freq: Four times a day (QID) | INTRAMUSCULAR | Status: DC | PRN

## 2014-04-04 MED ORDER — ONDANSETRON HCL 4 MG PO TABS: 4.0000 mg | ORAL_TABLET | Freq: Four times a day (QID) | ORAL | Status: DC | PRN

## 2014-04-04 MED ORDER — MORPHINE SULFATE 2 MG/ML IJ SOLN
1.0000 mg | INTRAMUSCULAR | Status: DC | PRN
  Administered 2014-04-04: 2 mg via INTRAVENOUS
  Administered 2014-04-04: 1 mg via INTRAVENOUS
  Administered 2014-04-06: 2 mg via INTRAVENOUS
  Filled 2014-04-04 (×3): qty 1

## 2014-04-04 NOTE — ED Notes (Signed)
Bed: JJ88 Expected date: 04/04/14 Expected time: 6:17 PM Means of arrival: Ambulance Comments: Hip injury

## 2014-04-04 NOTE — ED Provider Notes (Signed)
CSN: [id number]     Arrival date & time 04/04/14  1824 History   First MD Initiated Contact with Patient 04/04/14 1839     Chief Complaint  Patient presents with  . Hip Pain    left - post fall     (Consider location/radiation/quality/duration/timing/severity/associated sxs/prior Treatment) The history is provided by the patient and a significant other. No language interpreter was used.  [name] is a 81 y/o F with PMHx of dyslipidemia, diverticulitis, hypertension, osteoporosis, chronic back pain, coronary artery disease, PE, pacemaker placement presenting to emergency department after a fall that occurred at approximate 5:00 PM this evening. Patient is currently a resident at [facility]. As per family, reported that patient normally uses a walker to get around. Stated that patient was taken off her shoes and walked across the floor to sharp the door, stated that during this process she fell due to the floor being slippery. Family at bedside reported that there is no one there to witness the fall. Patient reported that she is having pain in her left hip. Patient reported that she felt mildly dizzy after falling. Family reported that patient was reminiscing and not speaking clearly shortly after the fall occurred. Stated that she is at baseline now. Patient denied head injury, chest pain, shortness of breath, difficulty breathing, blurred vision, sudden loss of vision, neck pain, back pain, stomach pain. Patient denied dizziness prior to the event. Denied headache. PCP Dr. Pete Glatter  Past Medical History  Diagnosis Date  . Dyslipidemia   . Diverticulitis   . HTN (hypertension)   . Osteoporosis   . Spinal stenosis   . Chronic low back pain   . CAD (coronary artery disease)   . Pulmonary embolism   . Nonischemic cardiomyopathy   . Ulcer disease   . H/O: hysterectomy   . Right knee pain 05/15/12  . Contusion of foot, right 05/15/12  . Weight loss   . Memory  disturbance 05/2012    MMSE 26/30, intact Clock test   Past Surgical History  Procedure Laterality Date  . Right shoulder repair  1985    Dr. [name]  . Left shoulder replacement  1991    Dr. [name]  . Appendectomy  2010  . Cataract extraction    . Colon resection  2005  . Back surgery    . Rotator cuff debridement  1999    Dr. [name]  . Coronary angioplasty with stent placement  01/2003  . Hemicolectomy    . Biv icd genertaor change out N/A 05/24/2011    Procedure: BIV ICD GENERTAOR CHANGE OUT;  Surgeon: [name], MD;  Location: [facility] CATH LAB;  Service: Cardiovascular;  Laterality: N/A;   Family History  Problem Relation Age of Onset  . Coronary artery disease Sister   . Heart attack Father 61  . Heart attack Mother   . Other Mother     abdominal blockage   History  Substance Use Topics  . Smoking status: Never Smoker   . Smokeless tobacco: Not on file  . Alcohol Use: No   OB History    No data available     Review of Systems  Constitutional: Negative for fever and chills.  Respiratory: Negative for chest tightness and shortness of breath.   Cardiovascular: Negative for chest pain.  Musculoskeletal: Positive for arthralgias (left hip pain). Negative for neck pain and neck stiffness.  Neurological: Negative for light-headedness and headaches.      Allergies  Arthrotec; Ibuprofen; Lactose intolerance (gi); Milk-related compounds; Other; Oxycontin; and Pollen extract  Home Medications   Prior to Admission medications   Medication Sig Start Date End Date Taking? Authorizing Provider  alendronate (FOSAMAX) 35 MG tablet Take 35 mg by mouth every 7 (seven) days. Take with a full glass of water on an empty stomach.   Yes Historical Provider, MD  carvedilol (COREG) 6.25 MG tablet Take 1 tablet by mouth Twice daily. 07/20/10  Yes Historical Provider, MD  cyanocobalamin 1000 MCG tablet Take 100 mcg by mouth daily.   Yes Historical Provider, MD  DULoxetine (CYMBALTA) 30 MG  capsule Take 30 mg by mouth daily.     Yes Historical Provider, MD  febuxostat (ULORIC) 40 MG tablet Take 40 mg by mouth daily.   Yes Historical Provider, MD  lansoprazole (PREVACID) 15 MG capsule Take 15 mg by mouth every morning. For reflux.   Yes Historical Provider, MD  nitroGLYCERIN (NITROSTAT) 0.4 MG SL tablet Place 0.4 mg under the tongue every 5 (five) minutes as needed for chest pain.   Yes Historical Provider, MD  Polysaccharide Iron Complex (POLY-IRON 150 PO) Take 1 tablet by mouth daily.    Yes Historical Provider, MD  tolterodine (DETROL LA) 4 MG 24 hr capsule Take 4 mg by mouth at bedtime and may repeat dose one time if needed.   Yes Historical Provider, MD  torsemide (DEMADEX) 20 MG tablet Take 10 mg by mouth every other day.  04/19/11  Yes Historical Provider, MD  albuterol (PROVENTIL HFA;VENTOLIN HFA) 108 (90 BASE) MCG/ACT inhaler Inhale 1-2 puffs into the lungs every 6 (six) hours as needed for wheezing. Patient not taking: Reported on 04/04/2014 10/09/12   Derwood Kaplan, MD   BP 132/104 mmHg  Pulse 77  Temp(Src) 98.6 F (37 C) (Oral)  Resp 18  SpO2 95% Physical Exam  Constitutional: She is oriented to person, place, and time. She appears well-developed and well-nourished. No distress.  HENT:  Head: Normocephalic and atraumatic.  Right Ear: External ear normal.  Left Ear: External ear normal.  Nose: Nose normal.  Mouth/Throat: Oropharynx is clear and moist. No oropharyngeal exudate.  Negative facial trauma Negative palpation hematomas  Negative crepitus or depression palpated to the skull/maxillary region Negative damage noted to dentition Negative septal hematoma noted  Eyes: Conjunctivae and EOM are normal. Pupils are equal, round, and reactive to light. Right eye exhibits no discharge. Left eye exhibits no discharge.  Negative nystagmus Visual fields grossly intact Negative crepitus upon palpation to the orbital Negative signs of entrapment  Neck: Normal range of  motion. Neck supple. No tracheal deviation present.  Negative neck stiffness Negative nuchal rigidity Negative cervical lymphadenopathy Negative pain upon palpation to the c-spine  Cardiovascular: Normal rate, regular rhythm and normal heart sounds.  Exam reveals no friction rub.   No murmur heard. Pulses:      Radial pulses are 2+ on the right side, and 2+ on the left side.       Dorsalis pedis pulses are 2+ on the right side, and 2+ on the left side.  Cap refill less than 3 seconds  Pulmonary/Chest: Effort normal and breath sounds normal. No respiratory distress. She has no wheezes. She has no rales. She exhibits no tenderness.  Negative ecchymosis Negative pain upon palpation to the chest wall Negative crepitus upon palpation to the chest wall Patient is able to speak in full sentences without difficulty Negative use of accessory muscles Negative stridor  Musculoskeletal: She exhibits tenderness.  Left hip: She exhibits decreased range of motion, tenderness and bony tenderness. She exhibits normal strength, no swelling, no crepitus and no deformity.       Legs: Left lower extremity identified to be short and internally rotated. Discomfort upon palpation to the left acetabulum. Decreased range of motion to the left hip secondary to pain.  Lymphadenopathy:    She has no cervical adenopathy.  Neurological: She is alert and oriented to person, place, and time. No cranial nerve deficit. She exhibits normal muscle tone. Coordination normal.  Cranial nerves III-XII grossly intact Strength 5+/5+ to lower extremities bilaterally with resistance applied Equal grip strength Negative facial drooping Negative slurred speech Negative aphasia Patient follows commands well Patient response to questions appropriately  Skin: Skin is warm and dry. No rash noted. She is not diaphoretic. No erythema.  Psychiatric: She has a normal mood and affect. Her behavior is normal. Thought content normal.    Nursing note and vitals reviewed.   ED Course  Procedures (including critical care time)  Results for orders placed or performed during the hospital encounter of 04/04/14  CBC with Differential  Result Value Ref Range   WBC 11.0 (H) 4.0 - 10.5 K/uL   RBC 4.26 3.87 - 5.11 MIL/uL   Hemoglobin 12.9 12.0 - 15.0 g/dL   HCT 16.1 09.6 - 04.5 %   MCV 92.5 78.0 - 100.0 fL   MCH 30.3 26.0 - 34.0 pg   MCHC 32.7 30.0 - 36.0 g/dL   RDW 40.9 81.1 - 91.4 %   Platelets 207 150 - 400 K/uL   Neutrophils Relative % 76 43 - 77 %   Neutro Abs 8.5 (H) 1.7 - 7.7 K/uL   Lymphocytes Relative 14 12 - 46 %   Lymphs Abs 1.5 0.7 - 4.0 K/uL   Monocytes Relative 7 3 - 12 %   Monocytes Absolute 0.8 0.1 - 1.0 K/uL   Eosinophils Relative 2 0 - 5 %   Eosinophils Absolute 0.2 0.0 - 0.7 K/uL   Basophils Relative 1 0 - 1 %   Basophils Absolute 0.1 0.0 - 0.1 K/uL  Comprehensive metabolic panel  Result Value Ref Range   Sodium 139 135 - 145 mmol/L   Potassium 3.6 3.5 - 5.1 mmol/L   Chloride 104 96 - 112 mEq/L   CO2 30 19 - 32 mmol/L   Glucose, Bld 108 (H) 70 - 99 mg/dL   BUN 15 6 - 23 mg/dL   Creatinine, Ser 7.82 0.50 - 1.10 mg/dL   Calcium 8.3 (L) 8.4 - 10.5 mg/dL   Total Protein 6.5 6.0 - 8.3 g/dL   Albumin 3.7 3.5 - 5.2 g/dL   AST 20 0 - 37 U/L   ALT 11 0 - 35 U/L   Alkaline Phosphatase 70 39 - 117 U/L   Total Bilirubin 0.7 0.3 - 1.2 mg/dL   GFR calc non Af Amer 59 (L) >90 mL/min   GFR calc Af Amer 68 (L) >90 mL/min   Anion gap 5 5 - 15  Troponin I  Result Value Ref Range   Troponin I <0.03 <0.031 ng/mL    Labs Review Labs Reviewed  CBC WITH DIFFERENTIAL - Abnormal; Notable for the following:    WBC 11.0 (*)    Neutro Abs 8.5 (*)    All other components within normal limits  COMPREHENSIVE METABOLIC PANEL - Abnormal; Notable for the following:    Glucose, Bld 108 (*)    Calcium 8.3 (*)  GFR calc non Af Amer 59 (*)    GFR calc Af Amer 68 (*)    All other components within normal limits   TROPONIN I  URINALYSIS, ROUTINE W REFLEX MICROSCOPIC  CBC  CREATININE, SERUM  COMPREHENSIVE METABOLIC PANEL  CBC WITH DIFFERENTIAL   Ct Head Wo Contrast  04/04/2014   CLINICAL DATA:  patient comes from [facility] with c/o left hip pain, shortening and external rotation post fall. Patient got up without her walker to close her front door. She had on panty hose and slipped and fell, landing on her left hip. Patient denies hitting her head or LOC. Patient denies neck and back pain  EXAM: CT HEAD WITHOUT CONTRAST  CT CERVICAL SPINE WITHOUT CONTRAST  TECHNIQUE: Multidetector CT imaging of the head and cervical spine was performed following the standard protocol without intravenous contrast. Multiplanar CT image reconstructions of the cervical spine were also generated.  COMPARISON:  None.  FINDINGS: CT HEAD FINDINGS  Ventricles are normal configuration. There is ventricular and sulcal enlargement reflecting age related volume loss. No parenchymal masses or mass effect. There are patchy areas of white matter hypoattenuation consistent with mild chronic microvascular ischemic change.  There is no evidence of a recent cortical infarct.  There are no extra-axial masses or abnormal fluid collections.  No intracranial hemorrhage.  Visualized sinuses are clear.  No skull fracture.  CT CERVICAL SPINE FINDINGS  No fracture. No spondylolisthesis. Mild loss of disc height with disc bulging is noted most evident at C3-C4. There are facet degenerative changes that are most evident on the left at C3-C4. Moderate neural foraminal narrowing is noted on the left at this level. Bones are diffusely demineralized. Soft tissues are unremarkable. Lung apices are clear.  IMPRESSION: HEAD CT:  No acute intracranial abnormalities.  No skull fracture.  CERVICAL CT:  No fracture or acute finding.   Electronically Signed   By: Amie Portland M.D.   On: 04/04/2014 20:15   Ct Cervical Spine Wo Contrast  04/04/2014    CLINICAL DATA:  patient comes from [facility] with c/o left hip pain, shortening and external rotation post fall. Patient got up without her walker to close her front door. She had on panty hose and slipped and fell, landing on her left hip. Patient denies hitting her head or LOC. Patient denies neck and back pain  EXAM: CT HEAD WITHOUT CONTRAST  CT CERVICAL SPINE WITHOUT CONTRAST  TECHNIQUE: Multidetector CT imaging of the head and cervical spine was performed following the standard protocol without intravenous contrast. Multiplanar CT image reconstructions of the cervical spine were also generated.  COMPARISON:  None.  FINDINGS: CT HEAD FINDINGS  Ventricles are normal configuration. There is ventricular and sulcal enlargement reflecting age related volume loss. No parenchymal masses or mass effect. There are patchy areas of white matter hypoattenuation consistent with mild chronic microvascular ischemic change.  There is no evidence of a recent cortical infarct.  There are no extra-axial masses or abnormal fluid collections.  No intracranial hemorrhage.  Visualized sinuses are clear.  No skull fracture.  CT CERVICAL SPINE FINDINGS  No fracture. No spondylolisthesis. Mild loss of disc height with disc bulging is noted most evident at C3-C4. There are facet degenerative changes that are most evident on the left at C3-C4. Moderate neural foraminal narrowing is noted on the left at this level. Bones are diffusely demineralized. Soft tissues are unremarkable. Lung apices are clear.  IMPRESSION: HEAD CT:  No acute intracranial  abnormalities.  No skull fracture.  CERVICAL CT:  No fracture or acute finding.   Electronically Signed   By: Amie Portland M.D.   On: 04/04/2014 20:15   Dg Chest Port 1 View  04/04/2014   CLINICAL DATA:  preop for hx of left hip fracture. Pt fell at her assisted living facility today. Pt takes HTN meds. Not diabetic. Nonsmoker. Pt has a pace maker that was placed several  years ago. Pt gets SOB every now and again. No chest pains, coughing, congestion, nausea, vomiting, or fevers  EXAM: PORTABLE CHEST - 1 VIEW  COMPARISON:  10/09/2012  FINDINGS: The cardiac silhouette normal in size. Moderate to large hiatal hernia projects over the cardiac silhouette. Mediastinum otherwise normal in contour. No hilar masses. Left anterior chest wall biventricular cardioverter-defibrillator is stable.  Clear lungs.  No pleural effusion or pneumothorax.  Left shoulder prosthesis, incompletely imaged, appears well aligned and well seated. Advanced arthropathic changes of the right shoulder are stable. Bones are diffusely demineralized.  IMPRESSION: No acute cardiopulmonary disease.   Electronically Signed   By: Amie Portland M.D.   On: 04/04/2014 19:41   Dg Hip Unilat With Pelvis Min 4 Views Left  04/04/2014   CLINICAL DATA:  Patient comes from [facility] with c/o left hip pain, shortening and external rotation post fall. Patient got up without her walker to close her front door. She had on panty hose and slipped and fell  EXAM: DG HIP W/ PWLVIS 4+V*L*  COMPARISON:  None.  FINDINGS: There is a fracture of the left femoral neck. Fracture is mid cervical with a separate fracture component of the subcapital femoral neck along the superior margin of the femoral head. Shaft component has migrated superiorly by approximately 1.5 cm. There is varus angulation and approximately 30 degrees of apex anterior angulation.  Bones are extensively demineralized. There are old pubic rami fractures bilaterally. No other acute fractures. No dislocation.  IMPRESSION: Displaced fracture of the left femoral neck with varus and apex anterior angulation.   Electronically Signed   By: Amie Portland M.D.   On: 04/04/2014 19:32    Imaging Review Ct Head Wo Contrast  04/04/2014   CLINICAL DATA:  patient comes from [facility] with c/o left hip pain, shortening and external rotation  post fall. Patient got up without her walker to close her front door. She had on panty hose and slipped and fell, landing on her left hip. Patient denies hitting her head or LOC. Patient denies neck and back pain  EXAM: CT HEAD WITHOUT CONTRAST  CT CERVICAL SPINE WITHOUT CONTRAST  TECHNIQUE: Multidetector CT imaging of the head and cervical spine was performed following the standard protocol without intravenous contrast. Multiplanar CT image reconstructions of the cervical spine were also generated.  COMPARISON:  None.  FINDINGS: CT HEAD FINDINGS  Ventricles are normal configuration. There is ventricular and sulcal enlargement reflecting age related volume loss. No parenchymal masses or mass effect. There are patchy areas of white matter hypoattenuation consistent with mild chronic microvascular ischemic change.  There is no evidence of a recent cortical infarct.  There are no extra-axial masses or abnormal fluid collections.  No intracranial hemorrhage.  Visualized sinuses are clear.  No skull fracture.  CT CERVICAL SPINE FINDINGS  No fracture. No spondylolisthesis. Mild loss of disc height with disc bulging is noted most evident at C3-C4. There are facet degenerative changes that are most evident on the left at C3-C4. Moderate neural foraminal narrowing  is noted on the left at this level. Bones are diffusely demineralized. Soft tissues are unremarkable. Lung apices are clear.  IMPRESSION: HEAD CT:  No acute intracranial abnormalities.  No skull fracture.  CERVICAL CT:  No fracture or acute finding.   Electronically Signed   By: Amie Portland M.D.   On: 04/04/2014 20:15   Ct Cervical Spine Wo Contrast  04/04/2014   CLINICAL DATA:  patient comes from [facility] with c/o left hip pain, shortening and external rotation post fall. Patient got up without her walker to close her front door. She had on panty hose and slipped and fell, landing on her left hip. Patient denies hitting her head or LOC.  Patient denies neck and back pain  EXAM: CT HEAD WITHOUT CONTRAST  CT CERVICAL SPINE WITHOUT CONTRAST  TECHNIQUE: Multidetector CT imaging of the head and cervical spine was performed following the standard protocol without intravenous contrast. Multiplanar CT image reconstructions of the cervical spine were also generated.  COMPARISON:  None.  FINDINGS: CT HEAD FINDINGS  Ventricles are normal configuration. There is ventricular and sulcal enlargement reflecting age related volume loss. No parenchymal masses or mass effect. There are patchy areas of white matter hypoattenuation consistent with mild chronic microvascular ischemic change.  There is no evidence of a recent cortical infarct.  There are no extra-axial masses or abnormal fluid collections.  No intracranial hemorrhage.  Visualized sinuses are clear.  No skull fracture.  CT CERVICAL SPINE FINDINGS  No fracture. No spondylolisthesis. Mild loss of disc height with disc bulging is noted most evident at C3-C4. There are facet degenerative changes that are most evident on the left at C3-C4. Moderate neural foraminal narrowing is noted on the left at this level. Bones are diffusely demineralized. Soft tissues are unremarkable. Lung apices are clear.  IMPRESSION: HEAD CT:  No acute intracranial abnormalities.  No skull fracture.  CERVICAL CT:  No fracture or acute finding.   Electronically Signed   By: Amie Portland M.D.   On: 04/04/2014 20:15   Dg Chest Port 1 View  04/04/2014   CLINICAL DATA:  preop for hx of left hip fracture. Pt fell at her assisted living facility today. Pt takes HTN meds. Not diabetic. Nonsmoker. Pt has a pace maker that was placed several years ago. Pt gets SOB every now and again. No chest pains, coughing, congestion, nausea, vomiting, or fevers  EXAM: PORTABLE CHEST - 1 VIEW  COMPARISON:  10/09/2012  FINDINGS: The cardiac silhouette normal in size. Moderate to large hiatal hernia projects over the cardiac silhouette. Mediastinum  otherwise normal in contour. No hilar masses. Left anterior chest wall biventricular cardioverter-defibrillator is stable.  Clear lungs.  No pleural effusion or pneumothorax.  Left shoulder prosthesis, incompletely imaged, appears well aligned and well seated. Advanced arthropathic changes of the right shoulder are stable. Bones are diffusely demineralized.  IMPRESSION: No acute cardiopulmonary disease.   Electronically Signed   By: Amie Portland M.D.   On: 04/04/2014 19:41   Dg Hip Unilat With Pelvis Min 4 Views Left  04/04/2014   CLINICAL DATA:  Patient comes from [facility] with c/o left hip pain, shortening and external rotation post fall. Patient got up without her walker to close her front door. She had on panty hose and slipped and fell  EXAM: DG HIP W/ PWLVIS 4+V*L*  COMPARISON:  None.  FINDINGS: There is a fracture of the left femoral neck. Fracture is mid cervical with a separate fracture  component of the subcapital femoral neck along the superior margin of the femoral head. Shaft component has migrated superiorly by approximately 1.5 cm. There is varus angulation and approximately 30 degrees of apex anterior angulation.  Bones are extensively demineralized. There are old pubic rami fractures bilaterally. No other acute fractures. No dislocation.  IMPRESSION: Displaced fracture of the left femoral neck with varus and apex anterior angulation.   Electronically Signed   By: Amie Portland M.D.   On: 04/04/2014 19:32     EKG Interpretation   Date/Time:  Sunday April 04 2014 18:32:40 EST Ventricular Rate:  83 PR Interval:  170 QRS Duration: 150 QT Interval:  437 QTC Calculation: 513 R Axis:   36 Text Interpretation:  Sinus rhythm IVCD, consider atypical LBBB Baseline  wander in lead(s) V1 Sinus rhythm Artifact Non-specific intra-ventricular  conduction delay Abnormal ekg Confirmed by Gerhard Munch  MD [phone]) on  04/04/2014 6:42:29 PM       8:45 PM This provider  spoke with Dr. Charlann Boxer, on-call for [facility] - discussed case in great detail. As per physician, reported that he will see the patient in the morning. Recommended patient to be NPO after midnight.   9:08 PM This provider spoke with Dr. Alvester Morin, [facility]. Discussed case in great detail, labs, imaging, ED course. Patient to be admitted. Discussed recommendation from Orthopedics. Will need Cardiology to consult for medical clearance - [facility] agreed.   MDM   Final diagnoses:  Pain  Left displaced femoral neck fracture, closed, initial encounter  Fall, initial encounter    Medications  heparin injection 5,000 Units (not administered)  sodium chloride 0.9 % injection 3 mL (not administered)  0.9 %  sodium chloride infusion (not administered)  morphine 2 MG/ML injection 1-2 mg (1 mg Intravenous Given 04/04/14 2132)  ondansetron (ZOFRAN) tablet 4 mg (not administered)    Or  ondansetron (ZOFRAN) injection 4 mg (not administered)  alendronate (FOSAMAX) tablet 35 mg (not administered)  carvedilol (COREG) tablet 6.25 mg (not administered)  DULoxetine (CYMBALTA) DR capsule 30 mg (not administered)  febuxostat (ULORIC) tablet 40 mg (not administered)  pantoprazole (PROTONIX) EC tablet 40 mg (not administered)  nitroGLYCERIN (NITROSTAT) SL tablet 0.4 mg (not administered)  torsemide (DEMADEX) tablet 10 mg (not administered)  fesoterodine (TOVIAZ) tablet 8 mg (not administered)    Filed Vitals:   04/04/14 1833 04/04/14 1947  BP: 139/87 132/104  Pulse: 82 77  Temp: 98.6 F (37 C)   TempSrc: Oral   Resp: 18 18  SpO2: 96% 95%   EKG noted sinus rhythm IVCD, consider atypical left bundle branch block, heart rate 83 bpm. Troponin negative elevation. CBC noted elevated white blood cell count of 11.0. Hemoglobin 12.9, hematocrit 39.4. CMP unremarkable. CT head no acute intracranial abnormalities, no skull fracture. CT cervical spine no fracture or acute findings identified. Portable  chest x-ray negative for acute cortical pulmonary disease. Plain film of left hip noted displaced fracture at the left femoral neck with varus and apex anterior angulation. Patient presenting to emergency department with left femoral neck fracture, acute-closed. Cap refill less than 3 seconds. Pulses palpable and strong. Patient has full range of motion to the left ankle and digits of the left foot without difficulty. Labs and other imaging unremarkable for acute rheumatic injury. Orthopedics consult it, patient to be seen in the morning Nothing by mouth after midnight. [facility] to admit patient-will most likely need cardiology consult for medical clearance prior to surgery. Discussed plan for admission with  patient and family at bedside-agreed to plan of care. Patient stable, afebrile. Patient not septic appearing. Patient stable for transfer.  Raymon Mutton, PA-C 04/04/14 2135  Elwin Mocha, MD 04/05/14 [phone]

## 2014-04-04 NOTE — ED Notes (Signed)
Family at bedside. 

## 2014-04-04 NOTE — ED Notes (Signed)
Pt is unable to give a urine sample at this time but will call out when she is able to.

## 2014-04-04 NOTE — ED Notes (Signed)
Per EMS - patient comes from [facility] with c/o left hip pain, shortening and external rotation post fall.  Patient got up without her walker to close her front door.  She had on panty hose and slipped and fell, landing on her left hip.  Patient denies hitting her head or LOC.  Patient denies neck and back pain.  Patient has a 20 gauge to LAC and received 50 mcg of Fentanyl en route.  Patient desated somewhat and was placed on O2.

## 2014-04-04 NOTE — Progress Notes (Signed)
PHARMACIST - PHYSICIAN COMMUNICATION  CONCERNING: P&T Medication Policy Regarding Oral Bisphosphonates  RECOMMENDATION: Your order for alendronate (Fosamax), ibandronate (Boniva), or risedronate (Actonel) has been discontinued at this time.  If the patient's post-hospital medical condition warrants safe use of this class of drugs, please resume the pre-hospital regimen upon discharge.  DESCRIPTION:  Alendronate (Fosamax), ibandronate (Boniva), and risedronate (Actonel) can cause severe esophageal erosions in patients who are unable to remain upright at least 30 minutes after taking this medication.   Since brief interruptions in therapy are thought to have minimal impact on bone mineral density, the Pharmacy & Therapeutics Committee has established that bisphosphonate orders should be routinely discontinued during hospitalization.   To override this safety policy and permit administration of Boniva, Fosamax, or Actonel in the hospital, prescribers must write "DO NOT HOLD" in the comments section when placing the order for this class of medications.  Arley Phenix RPh 04/04/2014, 9:58 PM Pager [phone]

## 2014-04-04 NOTE — ED Notes (Addendum)
Patient comes from [facility] after slipping and falling onto her left side.  Patient c/o left hip and pelvic pain.  Patient denies neck, back and head pain and does not believe she hit her head during fall.  Patient denies LOC.  Patient's left lower extremity is externally rotated with visible shortening.  +1 dorsalis pedis pulses palpated bilaterally.  +2 radial pulses palpated.  Patient's lung sounds clear bilaterally.  Heart sounds reveal no extra sounds, but rhythm is irregular on auscultation.  Bowel sounds are present and patient's abdomen is soft and non-tender to palpation.

## 2014-04-04 NOTE — H&P (Addendum)
Hospitalist Admission History and Physical  Patient name: [name] Medical record number: [id number] Date of birth: 18-Sep-1920 Age: 81 y.o. Gender: female  Primary Care Provider: Ginette Otto, MD  Chief Complaint: mechanical fall, hip fracture  History of Present Illness:This is a 81 y.o. year old female with significant past medical history of chronic systolic heart failure, dementia, palpitations/arrhythmia s/p pacemaker presenting with mechanical fall, hip fracture. Pt is resident of local ALF. Usually ambulates w/ rolling walker. Per pt, door was left open. Pt attempted to close door w/o assistance of rolling walker and fell. Pt believes she landed on her L hip. No LOC. No weakness, dizziness, CP prior to fall.  Presented to [facility] T 98.6, HR 70s-80s, resp 10s, BP 130s, satting 95% on RA. CBC and BMET WNL. Head and c spine CT WNL. CXR WNL. L hip xray shows displaced L femoral neck fracture.  Case discussed w/ [facility] (pt w/ prior knee surgery via dr. [name] per report) who recommends admission. Pt to be NPO PMN.   Assessment and Plan: [name] is a 81 y.o. year old female presenting with mechanical fall, hip fracture    Active Problems:   Hip fracture   1-hip fracture, mechanical fall  -hip fracture secondary to fall-mechanical in etiology -pain control -f/u orthopedic recs in am   2- Chronic systolic heart failure/palpitations/pacemaker  -currently asymptomatic currently  -mildly dry to euvolemic on exam  -no active pain currently  -may need preoperative clearance prior procedure  -cont home regimen in the interim -tele bed   3- Leukocytosis -likely reactive in setting of fracture -no overt signs of infection -follow  FEN/GI: NPO PMN. PPI  Prophylaxis: sub q heparin  Disposition: pending further evaluation  Code Status:DNR    Patient Active Problem List   Diagnosis Date Noted  . Hip fracture 04/04/2014  . Right knee pain   .  Memory disturbance   . Weight loss 05/27/2012  . Contusion of foot, right 05/15/2012  . Palpitations 01/09/2011  . Pacemaker 10/10/2010  . Chronic systolic heart failure 10/10/2010  . Dyslipidemia 10/10/2010   Past Medical History: Past Medical History  Diagnosis Date  . Dyslipidemia   . Diverticulitis   . HTN (hypertension)   . Osteoporosis   . Spinal stenosis   . Chronic low back pain   . CAD (coronary artery disease)   . Pulmonary embolism   . Nonischemic cardiomyopathy   . Ulcer disease   . H/O: hysterectomy   . Right knee pain 05/15/12  . Contusion of foot, right 05/15/12  . Weight loss   . Memory disturbance 05/2012    MMSE 26/30, intact Clock test    Past Surgical History: Past Surgical History  Procedure Laterality Date  . Right shoulder repair  1985    Dr. [name]  . Left shoulder replacement  1991    Dr. [name]  . Appendectomy  2010  . Cataract extraction    . Colon resection  2005  . Back surgery    . Rotator cuff debridement  1999    Dr. [name]  . Coronary angioplasty with stent placement  01/2003  . Hemicolectomy    . Biv icd genertaor change out N/A 05/24/2011    Procedure: BIV ICD GENERTAOR CHANGE OUT;  Surgeon: [name], MD;  [facility] CATH LAB;  Service: Cardiovascular;  Laterality: N/A;    Social History: History   Social History  . Marital Status: Widowed    Spouse  Name: N/A    Number of Children: N/A  . Years of Education: N/A   Social History Main Topics  . Smoking status: Never Smoker   . Smokeless tobacco: None  . Alcohol Use: No  . Drug Use: No  . Sexual Activity: None   Other Topics Concern  . None   Social History Narrative    Family History: Family History  Problem Relation Age of Onset  . Coronary artery disease Sister   . Heart attack Father 30  . Heart attack Mother   . Other Mother     abdominal blockage    Allergies: Allergies  Allergen Reactions  . Arthrotec [Diclofenac-Misoprostol]   . Ibuprofen   .  Lactose Intolerance (Gi)   . Milk-Related Compounds Nausea And Vomiting  . Other     Grass and ragweed  . Oxycontin [Oxycodone Hcl]   . Pollen Extract     Current Facility-Administered Medications  Medication Dose Route Frequency Provider Last Rate Last Dose  . 0.9 %  sodium chloride infusion   Intravenous Continuous Doree Albee, MD      . alendronate (FOSAMAX) tablet 35 mg  35 mg Oral Q7 days Doree Albee, MD      . Melene Muller ON 04/05/2014] carvedilol (COREG) tablet 6.25 mg  6.25 mg Oral BID WC Doree Albee, MD      . DULoxetine (CYMBALTA) DR capsule 30 mg  30 mg Oral Daily Doree Albee, MD      . febuxostat (ULORIC) tablet 40 mg  40 mg Oral Daily Doree Albee, MD      . fesoterodine (TOVIAZ) tablet 8 mg  8 mg Oral Daily Doree Albee, MD      . heparin injection 5,000 Units  5,000 Units Subcutaneous 3 times per day Doree Albee, MD      . morphine 2 MG/ML injection 1-2 mg  1-2 mg Intravenous Q2H PRN Doree Albee, MD      . nitroGLYCERIN (NITROSTAT) SL tablet 0.4 mg  0.4 mg Sublingual Q5 min PRN Doree Albee, MD      . ondansetron [facility]) tablet 4 mg  4 mg Oral Q6H PRN Doree Albee, MD       Or  . ondansetron [facility]) injection 4 mg  4 mg Intravenous Q6H PRN Doree Albee, MD      . pantoprazole (PROTONIX) EC tablet 40 mg  40 mg Oral Daily Doree Albee, MD      . sodium chloride 0.9 % injection 3 mL  3 mL Intravenous Q12H Doree Albee, MD      . torsemide [facility]) tablet 10 mg  10 mg Oral QODAY Doree Albee, MD       Current Outpatient Prescriptions  Medication Sig Dispense Refill  . alendronate (FOSAMAX) 35 MG tablet Take 35 mg by mouth every 7 (seven) days. Take with a full glass of water on an empty stomach.    . carvedilol (COREG) 6.25 MG tablet Take 1 tablet by mouth Twice daily.    . cyanocobalamin 1000 MCG tablet Take 100 mcg by mouth daily.    . DULoxetine (CYMBALTA) 30 MG capsule Take 30 mg by mouth daily.      . febuxostat (ULORIC) 40 MG tablet Take 40 mg by  mouth daily.    . lansoprazole (PREVACID) 15 MG capsule Take 15 mg by mouth every morning. For reflux.    . nitroGLYCERIN (NITROSTAT) 0.4 MG SL tablet Place 0.4 mg under the tongue every 5 (five) minutes as needed for chest  pain.    . Polysaccharide Iron Complex (POLY-IRON 150 PO) Take 1 tablet by mouth daily.     Marland Kitchen tolterodine (DETROL LA) 4 MG 24 hr capsule Take 4 mg by mouth at bedtime and may repeat dose one time if needed.    . torsemide (DEMADEX) 20 MG tablet Take 10 mg by mouth every other day.     . albuterol (PROVENTIL HFA;VENTOLIN HFA) 108 (90 BASE) MCG/ACT inhaler Inhale 1-2 puffs into the lungs every 6 (six) hours as needed for wheezing. (Patient not taking: Reported on 04/04/2014) 1 Inhaler 0   Review Of Systems: 12 point ROS negative except as noted above in HPI.  Physical Exam: Filed Vitals:   04/04/14 1947  BP: 132/104  Pulse: 77  Temp:   Resp: 18    General: alert and cooperative HEENT: PERRLA and extra ocular movement intact Heart: S1, S2 normal, no murmur, rub or gallop, regular rate and rhythm Lungs: clear to auscultation, no wheezes or rales and unlabored breathing Abdomen: abdomen is soft without significant tenderness, masses, organomegaly or guarding Extremities: mild L hip pain-TTP, neurovascularly intact distally  Skin:no rashes Neurology: normal without focal findings  Labs and Imaging: Lab Results  Component Value Date/Time   NA 139 04/04/2014 07:28 PM   K 3.6 04/04/2014 07:28 PM   CL 104 04/04/2014 07:28 PM   CO2 30 04/04/2014 07:28 PM   BUN 15 04/04/2014 07:28 PM   CREATININE 0.84 04/04/2014 07:28 PM   GLUCOSE 108* 04/04/2014 07:28 PM   Lab Results  Component Value Date   WBC 11.0* 04/04/2014   HGB 12.9 04/04/2014   HCT 39.4 04/04/2014   MCV 92.5 04/04/2014   PLT 207 04/04/2014    Ct Head Wo Contrast  04/04/2014   CLINICAL DATA:  patient comes from [facility] with c/o left hip pain, shortening and external rotation post  fall. Patient got up without her walker to close her front door. She had on panty hose and slipped and fell, landing on her left hip. Patient denies hitting her head or LOC. Patient denies neck and back pain  EXAM: CT HEAD WITHOUT CONTRAST  CT CERVICAL SPINE WITHOUT CONTRAST  TECHNIQUE: Multidetector CT imaging of the head and cervical spine was performed following the standard protocol without intravenous contrast. Multiplanar CT image reconstructions of the cervical spine were also generated.  COMPARISON:  None.  FINDINGS: CT HEAD FINDINGS  Ventricles are normal configuration. There is ventricular and sulcal enlargement reflecting age related volume loss. No parenchymal masses or mass effect. There are patchy areas of white matter hypoattenuation consistent with mild chronic microvascular ischemic change.  There is no evidence of a recent cortical infarct.  There are no extra-axial masses or abnormal fluid collections.  No intracranial hemorrhage.  Visualized sinuses are clear.  No skull fracture.  CT CERVICAL SPINE FINDINGS  No fracture. No spondylolisthesis. Mild loss of disc height with disc bulging is noted most evident at C3-C4. There are facet degenerative changes that are most evident on the left at C3-C4. Moderate neural foraminal narrowing is noted on the left at this level. Bones are diffusely demineralized. Soft tissues are unremarkable. Lung apices are clear.  IMPRESSION: HEAD CT:  No acute intracranial abnormalities.  No skull fracture.  CERVICAL CT:  No fracture or acute finding.   Electronically Signed   By: Amie Portland M.D.   On: 04/04/2014 20:15   Ct Cervical Spine Wo Contrast  04/04/2014   CLINICAL DATA:  patient comes  from [facility] with c/o left hip pain, shortening and external rotation post fall. Patient got up without her walker to close her front door. She had on panty hose and slipped and fell, landing on her left hip. Patient denies hitting her head or LOC.  Patient denies neck and back pain  EXAM: CT HEAD WITHOUT CONTRAST  CT CERVICAL SPINE WITHOUT CONTRAST  TECHNIQUE: Multidetector CT imaging of the head and cervical spine was performed following the standard protocol without intravenous contrast. Multiplanar CT image reconstructions of the cervical spine were also generated.  COMPARISON:  None.  FINDINGS: CT HEAD FINDINGS  Ventricles are normal configuration. There is ventricular and sulcal enlargement reflecting age related volume loss. No parenchymal masses or mass effect. There are patchy areas of white matter hypoattenuation consistent with mild chronic microvascular ischemic change.  There is no evidence of a recent cortical infarct.  There are no extra-axial masses or abnormal fluid collections.  No intracranial hemorrhage.  Visualized sinuses are clear.  No skull fracture.  CT CERVICAL SPINE FINDINGS  No fracture. No spondylolisthesis. Mild loss of disc height with disc bulging is noted most evident at C3-C4. There are facet degenerative changes that are most evident on the left at C3-C4. Moderate neural foraminal narrowing is noted on the left at this level. Bones are diffusely demineralized. Soft tissues are unremarkable. Lung apices are clear.  IMPRESSION: HEAD CT:  No acute intracranial abnormalities.  No skull fracture.  CERVICAL CT:  No fracture or acute finding.   Electronically Signed   By: Amie Portland M.D.   On: 04/04/2014 20:15   Dg Chest Port 1 View  04/04/2014   CLINICAL DATA:  preop for hx of left hip fracture. Pt fell at her assisted living facility today. Pt takes HTN meds. Not diabetic. Nonsmoker. Pt has a pace maker that was placed several years ago. Pt gets SOB every now and again. No chest pains, coughing, congestion, nausea, vomiting, or fevers  EXAM: PORTABLE CHEST - 1 VIEW  COMPARISON:  10/09/2012  FINDINGS: The cardiac silhouette normal in size. Moderate to large hiatal hernia projects over the cardiac silhouette. Mediastinum  otherwise normal in contour. No hilar masses. Left anterior chest wall biventricular cardioverter-defibrillator is stable.  Clear lungs.  No pleural effusion or pneumothorax.  Left shoulder prosthesis, incompletely imaged, appears well aligned and well seated. Advanced arthropathic changes of the right shoulder are stable. Bones are diffusely demineralized.  IMPRESSION: No acute cardiopulmonary disease.   Electronically Signed   By: Amie Portland M.D.   On: 04/04/2014 19:41   Dg Hip Unilat With Pelvis Min 4 Views Left  04/04/2014   CLINICAL DATA:  Patient comes from [facility] with c/o left hip pain, shortening and external rotation post fall. Patient got up without her walker to close her front door. She had on panty hose and slipped and fell  EXAM: DG HIP W/ PWLVIS 4+V*L*  COMPARISON:  None.  FINDINGS: There is a fracture of the left femoral neck. Fracture is mid cervical with a separate fracture component of the subcapital femoral neck along the superior margin of the femoral head. Shaft component has migrated superiorly by approximately 1.5 cm. There is varus angulation and approximately 30 degrees of apex anterior angulation.  Bones are extensively demineralized. There are old pubic rami fractures bilaterally. No other acute fractures. No dislocation.  IMPRESSION: Displaced fracture of the left femoral neck with varus and apex anterior angulation.   Electronically Signed  By: Amie Portland M.D.   On: 04/04/2014 19:32           Doree Albee MD  Pager: [phone]

## 2014-04-04 NOTE — ED Notes (Signed)
Patient transported to X-ray 

## 2014-04-05 ENCOUNTER — Encounter (HOSPITAL_COMMUNITY)
Admission: EM | Disposition: A | Discharge status: Skilled Nursing Facility | Payer: Self-pay | Source: Home / Self Care | Attending: Internal Medicine

## 2014-04-05 ENCOUNTER — Observation Stay (HOSPITAL_COMMUNITY): Payer: Medicare Other | Admitting: Certified Registered Nurse Anesthetist

## 2014-04-05 ENCOUNTER — Inpatient Hospital Stay (HOSPITAL_COMMUNITY): Payer: Medicare Other

## 2014-04-05 DIAGNOSIS — I5022 Chronic systolic (congestive) heart failure: Secondary | ICD-10-CM | POA: Diagnosis present

## 2014-04-05 DIAGNOSIS — Z79899 Other long term (current) drug therapy: Secondary | ICD-10-CM | POA: Diagnosis not present

## 2014-04-05 DIAGNOSIS — W010XXA Fall on same level from slipping, tripping and stumbling without subsequent striking against object, initial encounter: Secondary | ICD-10-CM | POA: Diagnosis present

## 2014-04-05 DIAGNOSIS — Z86711 Personal history of pulmonary embolism: Secondary | ICD-10-CM | POA: Diagnosis not present

## 2014-04-05 DIAGNOSIS — M25552 Pain in left hip: Secondary | ICD-10-CM | POA: Diagnosis present

## 2014-04-05 DIAGNOSIS — M810 Age-related osteoporosis without current pathological fracture: Secondary | ICD-10-CM | POA: Diagnosis present

## 2014-04-05 DIAGNOSIS — Z950 Presence of cardiac pacemaker: Secondary | ICD-10-CM | POA: Diagnosis not present

## 2014-04-05 DIAGNOSIS — I509 Heart failure, unspecified: Secondary | ICD-10-CM

## 2014-04-05 DIAGNOSIS — S72012A Unspecified intracapsular fracture of left femur, initial encounter for closed fracture: Secondary | ICD-10-CM | POA: Diagnosis present

## 2014-04-05 DIAGNOSIS — Z01810 Encounter for preprocedural cardiovascular examination: Secondary | ICD-10-CM

## 2014-04-05 DIAGNOSIS — Z66 Do not resuscitate: Secondary | ICD-10-CM | POA: Diagnosis present

## 2014-04-05 DIAGNOSIS — Y92099 Unspecified place in other non-institutional residence as the place of occurrence of the external cause: Secondary | ICD-10-CM | POA: Diagnosis not present

## 2014-04-05 DIAGNOSIS — S72032A Displaced midcervical fracture of left femur, initial encounter for closed fracture: Secondary | ICD-10-CM | POA: Diagnosis present

## 2014-04-05 DIAGNOSIS — F0390 Unspecified dementia without behavioral disturbance: Secondary | ICD-10-CM | POA: Diagnosis not present

## 2014-04-05 DIAGNOSIS — I2510 Atherosclerotic heart disease of native coronary artery without angina pectoris: Secondary | ICD-10-CM

## 2014-04-05 DIAGNOSIS — I429 Cardiomyopathy, unspecified: Secondary | ICD-10-CM | POA: Diagnosis present

## 2014-04-05 DIAGNOSIS — I10 Essential (primary) hypertension: Secondary | ICD-10-CM | POA: Diagnosis present

## 2014-04-05 DIAGNOSIS — E785 Hyperlipidemia, unspecified: Secondary | ICD-10-CM | POA: Diagnosis present

## 2014-04-05 DIAGNOSIS — I447 Left bundle-branch block, unspecified: Secondary | ICD-10-CM | POA: Diagnosis present

## 2014-04-05 DIAGNOSIS — Z96612 Presence of left artificial shoulder joint: Secondary | ICD-10-CM | POA: Diagnosis present

## 2014-04-05 HISTORY — PX: HIP ARTHROPLASTY: SHX981

## 2014-04-05 LAB — SURGICAL PCR SCREEN
MRSA, PCR: NEGATIVE
Staphylococcus aureus: NEGATIVE

## 2014-04-05 LAB — CBC WITH DIFFERENTIAL/PLATELET
BASOS ABS: 0.1 10*3/uL (ref 0.0–0.1)
BASOS PCT: 0 % (ref 0–1)
Eosinophils Absolute: 0.1 10*3/uL (ref 0.0–0.7)
Eosinophils Relative: 1 % (ref 0–5)
HEMATOCRIT: 39.8 % (ref 36.0–46.0)
HEMOGLOBIN: 13 g/dL (ref 12.0–15.0)
LYMPHS ABS: 1 10*3/uL (ref 0.7–4.0)
Lymphocytes Relative: 6 % — ABNORMAL LOW (ref 12–46)
MCH: 30.6 pg (ref 26.0–34.0)
MCHC: 32.7 g/dL (ref 30.0–36.0)
MCV: 93.6 fL (ref 78.0–100.0)
Monocytes Absolute: 0.9 10*3/uL (ref 0.1–1.0)
Monocytes Relative: 6 % (ref 3–12)
NEUTROS ABS: 14.4 10*3/uL — AB (ref 1.7–7.7)
Neutrophils Relative %: 87 % — ABNORMAL HIGH (ref 43–77)
Platelets: 203 10*3/uL (ref 150–400)
RBC: 4.25 MIL/uL (ref 3.87–5.11)
RDW: 13.3 % (ref 11.5–15.5)
WBC: 16.4 10*3/uL — ABNORMAL HIGH (ref 4.0–10.5)

## 2014-04-05 LAB — COMPREHENSIVE METABOLIC PANEL
ALT: 11 U/L (ref 0–35)
ANION GAP: 9 (ref 5–15)
AST: 21 U/L (ref 0–37)
Albumin: 3.8 g/dL (ref 3.5–5.2)
Alkaline Phosphatase: 68 U/L (ref 39–117)
BUN: 15 mg/dL (ref 6–23)
CHLORIDE: 101 meq/L (ref 96–112)
CO2: 34 mmol/L — AB (ref 19–32)
Calcium: 8.5 mg/dL (ref 8.4–10.5)
Creatinine, Ser: 0.83 mg/dL (ref 0.50–1.10)
GFR calc non Af Amer: 60 mL/min — ABNORMAL LOW (ref >90)
GFR, EST AFRICAN AMERICAN: 69 mL/min — AB (ref >90)
GLUCOSE: 124 mg/dL — AB (ref 70–99)
Potassium: 3.9 mmol/L (ref 3.5–5.1)
Sodium: 144 mmol/L (ref 135–145)
TOTAL PROTEIN: 6.6 g/dL (ref 6.0–8.3)
Total Bilirubin: 1.1 mg/dL (ref 0.3–1.2)

## 2014-04-05 LAB — TYPE AND SCREEN
ABO/RH(D): A NEG
Antibody Screen: NEGATIVE

## 2014-04-05 LAB — TROPONIN I: Troponin I: 0.03 ng/mL (ref <0.031)

## 2014-04-05 SURGERY — HEMIARTHROPLASTY, HIP, DIRECT ANTERIOR APPROACH, FOR FRACTURE
Anesthesia: General | Site: Hip | Laterality: Left

## 2014-04-05 MED ORDER — ACETAMINOPHEN 650 MG RE SUPP: 650.0000 mg | Freq: Four times a day (QID) | RECTAL | Status: DC | PRN

## 2014-04-05 MED ORDER — METOCLOPRAMIDE HCL 5 MG/ML IJ SOLN: 5.0000 mg | Freq: Three times a day (TID) | INTRAMUSCULAR | Status: DC | PRN

## 2014-04-05 MED ORDER — DEXAMETHASONE SODIUM PHOSPHATE 10 MG/ML IJ SOLN
INTRAMUSCULAR | Status: DC | PRN
  Administered 2014-04-05: 10 mg via INTRAVENOUS

## 2014-04-05 MED ORDER — ALUM & MAG HYDROXIDE-SIMETH 200-200-20 MG/5ML PO SUSP: 30.0000 mL | ORAL | Status: DC | PRN

## 2014-04-05 MED ORDER — FENTANYL CITRATE 0.05 MG/ML IJ SOLN: 25.0000 ug | INTRAMUSCULAR | Status: DC | PRN

## 2014-04-05 MED ORDER — ENOXAPARIN SODIUM 30 MG/0.3ML ~~LOC~~ SOLN
30.0000 mg | SUBCUTANEOUS | Status: DC
  Administered 2014-04-06 – 2014-04-07 (×2): 30 mg via SUBCUTANEOUS
  Filled 2014-04-05 (×2): qty 0.3

## 2014-04-05 MED ORDER — FENTANYL CITRATE 0.05 MG/ML IJ SOLN
INTRAMUSCULAR | Status: DC | PRN
  Administered 2014-04-05: 50 ug via INTRAVENOUS

## 2014-04-05 MED ORDER — FENTANYL CITRATE 0.05 MG/ML IJ SOLN
INTRAMUSCULAR | Status: AC
  Filled 2014-04-05: qty 5

## 2014-04-05 MED ORDER — PROPOFOL 10 MG/ML IV BOLUS
INTRAVENOUS | Status: DC | PRN
  Administered 2014-04-05: 80 mg via INTRAVENOUS

## 2014-04-05 MED ORDER — PHENYLEPHRINE HCL 10 MG/ML IJ SOLN
INTRAMUSCULAR | Status: DC | PRN
  Administered 2014-04-05: 80 ug via INTRAVENOUS

## 2014-04-05 MED ORDER — VITAMINS A & D EX OINT
TOPICAL_OINTMENT | CUTANEOUS | Status: AC
  Filled 2014-04-05: qty 5

## 2014-04-05 MED ORDER — FENTANYL CITRATE 0.05 MG/ML IJ SOLN
50.0000 ug | INTRAMUSCULAR | Status: DC | PRN
  Administered 2014-04-05: 50 ug via INTRAVENOUS

## 2014-04-05 MED ORDER — CISATRACURIUM BESYLATE 20 MG/10ML IV SOLN
INTRAVENOUS | Status: AC
  Filled 2014-04-05: qty 10

## 2014-04-05 MED ORDER — DEXTROSE 5 % IV SOLN
10.0000 mg | INTRAVENOUS | Status: DC | PRN
  Administered 2014-04-05: 5 ug/min via INTRAVENOUS

## 2014-04-05 MED ORDER — ONDANSETRON HCL 4 MG/2ML IJ SOLN
INTRAMUSCULAR | Status: DC | PRN
  Administered 2014-04-05: 4 mg via INTRAVENOUS

## 2014-04-05 MED ORDER — ACETAMINOPHEN 325 MG PO TABS
650.0000 mg | ORAL_TABLET | Freq: Four times a day (QID) | ORAL | Status: DC | PRN
Start: 2014-04-05 — End: 2014-04-07
  Administered 2014-04-07: 650 mg via ORAL
  Filled 2014-04-05: qty 2

## 2014-04-05 MED ORDER — CEFAZOLIN SODIUM-DEXTROSE 2-3 GM-% IV SOLR
INTRAVENOUS | Status: AC
  Filled 2014-04-05: qty 50

## 2014-04-05 MED ORDER — PHENOL 1.4 % MT LIQD
1.0000 | OROMUCOSAL | Status: DC | PRN
  Filled 2014-04-05: qty 177

## 2014-04-05 MED ORDER — CEFAZOLIN SODIUM-DEXTROSE 2-3 GM-% IV SOLR
INTRAVENOUS | Status: DC | PRN
  Administered 2014-04-05: 2 g via INTRAVENOUS

## 2014-04-05 MED ORDER — PHENYLEPHRINE HCL 10 MG/ML IJ SOLN
INTRAMUSCULAR | Status: AC
  Filled 2014-04-05: qty 1

## 2014-04-05 MED ORDER — SODIUM CHLORIDE 0.9 % IV SOLN
INTRAVENOUS | Status: DC
  Filled 2014-04-05 (×2): qty 1000

## 2014-04-05 MED ORDER — TRAMADOL-ACETAMINOPHEN 37.5-325 MG PO TABS: 1.0000 | ORAL_TABLET | Freq: Four times a day (QID) | ORAL | Status: DC | PRN

## 2014-04-05 MED ORDER — TRAMADOL HCL 50 MG PO TABS: 50.0000 mg | ORAL_TABLET | Freq: Four times a day (QID) | ORAL | Status: DC | PRN

## 2014-04-05 MED ORDER — PROPOFOL 10 MG/ML IV BOLUS
INTRAVENOUS | Status: AC
  Filled 2014-04-05: qty 20

## 2014-04-05 MED ORDER — LIDOCAINE HCL (CARDIAC) 20 MG/ML IV SOLN
INTRAVENOUS | Status: DC | PRN
  Administered 2014-04-05: 65 mg via INTRAVENOUS

## 2014-04-05 MED ORDER — DEXAMETHASONE SODIUM PHOSPHATE 10 MG/ML IJ SOLN
INTRAMUSCULAR | Status: AC
  Filled 2014-04-05: qty 1

## 2014-04-05 MED ORDER — GLYCOPYRROLATE 0.2 MG/ML IJ SOLN
INTRAMUSCULAR | Status: DC | PRN
  Administered 2014-04-05: 0.4 mg via INTRAVENOUS

## 2014-04-05 MED ORDER — LACTATED RINGERS IV SOLN
INTRAVENOUS | Status: DC
Start: 2014-04-05 — End: 2014-04-05
  Administered 2014-04-05: 17:00:00 via INTRAVENOUS
  Administered 2014-04-05: 1000 mL via INTRAVENOUS

## 2014-04-05 MED ORDER — NEOSTIGMINE METHYLSULFATE 10 MG/10ML IV SOLN
INTRAVENOUS | Status: AC
  Filled 2014-04-05: qty 1

## 2014-04-05 MED ORDER — VITAMIN B-12 1000 MCG PO TABS
1000.0000 ug | ORAL_TABLET | Freq: Every day | ORAL | Status: DC
  Administered 2014-04-06 – 2014-04-07 (×2): 1000 ug via ORAL
  Filled 2014-04-05 (×2): qty 1

## 2014-04-05 MED ORDER — HYDROCODONE-ACETAMINOPHEN 5-325 MG PO TABS
1.0000 | ORAL_TABLET | Freq: Four times a day (QID) | ORAL | Status: DC | PRN
  Filled 2014-04-05: qty 1

## 2014-04-05 MED ORDER — DOCUSATE SODIUM 100 MG PO CAPS
100.0000 mg | ORAL_CAPSULE | Freq: Two times a day (BID) | ORAL | Status: DC
  Administered 2014-04-05 – 2014-04-07 (×4): 100 mg via ORAL
  Filled 2014-04-05 (×5): qty 1

## 2014-04-05 MED ORDER — VITAMINS A & D EX OINT
TOPICAL_OINTMENT | CUTANEOUS | Status: AC
  Administered 2014-04-05: 23:00:00
  Filled 2014-04-05: qty 5

## 2014-04-05 MED ORDER — ASPIRIN EC 81 MG PO TBEC
81.0000 mg | DELAYED_RELEASE_TABLET | Freq: Every day | ORAL | Status: DC
  Administered 2014-04-06 – 2014-04-07 (×2): 81 mg via ORAL
  Filled 2014-04-05 (×2): qty 1

## 2014-04-05 MED ORDER — GLYCOPYRROLATE 0.2 MG/ML IJ SOLN
INTRAMUSCULAR | Status: AC
  Filled 2014-04-05: qty 2

## 2014-04-05 MED ORDER — CISATRACURIUM BESYLATE (PF) 10 MG/5ML IV SOLN
INTRAVENOUS | Status: DC | PRN
  Administered 2014-04-05: 4 mg via INTRAVENOUS

## 2014-04-05 MED ORDER — ALBUTEROL SULFATE (2.5 MG/3ML) 0.083% IN NEBU: 2.5000 mg | INHALATION_SOLUTION | Freq: Four times a day (QID) | RESPIRATORY_TRACT | Status: DC | PRN

## 2014-04-05 MED ORDER — ONDANSETRON HCL 4 MG/2ML IJ SOLN: 4.0000 mg | Freq: Four times a day (QID) | INTRAMUSCULAR | Status: DC | PRN

## 2014-04-05 MED ORDER — LIDOCAINE HCL (CARDIAC) 20 MG/ML IV SOLN
INTRAVENOUS | Status: AC
  Filled 2014-04-05: qty 5

## 2014-04-05 MED ORDER — NEOSTIGMINE METHYLSULFATE 10 MG/10ML IV SOLN
INTRAVENOUS | Status: DC | PRN
  Administered 2014-04-05: 2 mg via INTRAVENOUS

## 2014-04-05 MED ORDER — SUCCINYLCHOLINE CHLORIDE 20 MG/ML IJ SOLN
INTRAMUSCULAR | Status: DC | PRN
  Administered 2014-04-05: 80 mg via INTRAVENOUS

## 2014-04-05 MED ORDER — FENTANYL CITRATE 0.05 MG/ML IJ SOLN
INTRAMUSCULAR | Status: AC
  Filled 2014-04-05: qty 2

## 2014-04-05 MED ORDER — MENTHOL 3 MG MT LOZG
1.0000 | LOZENGE | OROMUCOSAL | Status: DC | PRN
  Filled 2014-04-05: qty 9

## 2014-04-05 MED ORDER — METOCLOPRAMIDE HCL 10 MG PO TABS: 5.0000 mg | ORAL_TABLET | Freq: Three times a day (TID) | ORAL | Status: DC | PRN

## 2014-04-05 MED ORDER — ONDANSETRON HCL 4 MG/2ML IJ SOLN
INTRAMUSCULAR | Status: AC
Start: 2014-04-05 — End: 2014-04-05
  Filled 2014-04-05: qty 2

## 2014-04-05 MED ORDER — ASPIRIN EC 325 MG PO TBEC: 325.0000 mg | DELAYED_RELEASE_TABLET | Freq: Two times a day (BID) | ORAL | Status: DC

## 2014-04-05 MED ORDER — POLYETHYLENE GLYCOL 3350 17 G PO PACK: 17.0000 g | PACK | Freq: Every day | ORAL | Status: DC | PRN

## 2014-04-05 MED ORDER — CEFAZOLIN SODIUM 1-5 GM-% IV SOLN
1.0000 g | Freq: Four times a day (QID) | INTRAVENOUS | Status: AC
  Administered 2014-04-05 – 2014-04-06 (×2): 1 g via INTRAVENOUS
  Filled 2014-04-05 (×2): qty 50

## 2014-04-05 MED ORDER — PROMETHAZINE HCL 25 MG/ML IJ SOLN
6.2500 mg | INTRAMUSCULAR | Status: DC | PRN
Start: 2014-04-05 — End: 2014-04-05

## 2014-04-05 MED ORDER — ONDANSETRON HCL 4 MG PO TABS: 4.0000 mg | ORAL_TABLET | Freq: Four times a day (QID) | ORAL | Status: DC | PRN

## 2014-04-05 SURGICAL SUPPLY — 48 items
BAG SPEC THK2 15X12 ZIP CLS (MISCELLANEOUS) ×1
BAG ZIPLOCK 12X15 (MISCELLANEOUS) ×2 IMPLANT
BLADE SAW SGTL 18X1.27X75 (BLADE) ×2 IMPLANT
CAPT HIP HEMI 1 ×1 IMPLANT
DRAPE INCISE IOBAN 85X60 (DRAPES) ×2 IMPLANT
DRAPE ORTHO SPLIT 77X108 STRL (DRAPES) ×4
DRAPE POUCH INSTRU U-SHP 10X18 (DRAPES) ×2 IMPLANT
DRAPE SURG 17X11 SM STRL (DRAPES) ×2 IMPLANT
DRAPE SURG ORHT 6 SPLT 77X108 (DRAPES) ×2 IMPLANT
DRAPE U-SHAPE 47X51 STRL (DRAPES) ×2 IMPLANT
DRSG AQUACEL AG ADV 3.5X10 (GAUZE/BANDAGES/DRESSINGS) ×2 IMPLANT
DRSG TEGADERM 4X4.75 (GAUZE/BANDAGES/DRESSINGS) ×1 IMPLANT
DURAPREP 26ML APPLICATOR (WOUND CARE) ×2 IMPLANT
ELECT BLADE TIP CTD 4 INCH (ELECTRODE) ×2 IMPLANT
ELECT REM PT RETURN 9FT ADLT (ELECTROSURGICAL) ×2
ELECTRODE REM PT RTRN 9FT ADLT (ELECTROSURGICAL) ×1 IMPLANT
EVACUATOR 1/8 PVC DRAIN (DRAIN) ×2 IMPLANT
FACESHIELD WRAPAROUND (MASK) ×8 IMPLANT
FACESHIELD WRAPAROUND OR TEAM (MASK) ×4 IMPLANT
GAUZE SPONGE 2X2 8PLY STRL LF (GAUZE/BANDAGES/DRESSINGS) ×1 IMPLANT
GLOVE BIOGEL PI IND STRL 7.5 (GLOVE) ×1 IMPLANT
GLOVE BIOGEL PI IND STRL 8.5 (GLOVE) ×1 IMPLANT
GLOVE BIOGEL PI INDICATOR 7.5 (GLOVE) ×1
GLOVE BIOGEL PI INDICATOR 8.5 (GLOVE) ×1
GLOVE ECLIPSE 8.0 STRL XLNG CF (GLOVE) IMPLANT
GLOVE ORTHO TXT STRL SZ7.5 (GLOVE) ×4 IMPLANT
GLOVE SURG ORTHO 8.0 STRL STRW (GLOVE) ×2 IMPLANT
GOWN SPEC L3 XXLG W/TWL (GOWN DISPOSABLE) ×4 IMPLANT
GOWN STRL REUS W/TWL LRG LVL3 (GOWN DISPOSABLE) ×2 IMPLANT
HANDPIECE INTERPULSE COAX TIP (DISPOSABLE)
IMMOBILIZER KNEE 20 (SOFTGOODS)
IMMOBILIZER KNEE 20 THIGH 36 (SOFTGOODS) IMPLANT
KIT BASIN OR (CUSTOM PROCEDURE TRAY) ×2 IMPLANT
LIQUID BAND (GAUZE/BANDAGES/DRESSINGS) ×2 IMPLANT
MANIFOLD NEPTUNE II (INSTRUMENTS) ×2 IMPLANT
PACK TOTAL JOINT (CUSTOM PROCEDURE TRAY) ×2 IMPLANT
POSITIONER SURGICAL ARM (MISCELLANEOUS) ×2 IMPLANT
SET HNDPC FAN SPRY TIP SCT (DISPOSABLE) IMPLANT
SPONGE GAUZE 2X2 STER 10/PKG (GAUZE/BANDAGES/DRESSINGS)
STRIP CLOSURE SKIN 1/2X4 (GAUZE/BANDAGES/DRESSINGS) ×3 IMPLANT
SUT ETHIBOND NAB CT1 #1 30IN (SUTURE) ×2 IMPLANT
SUT MNCRL AB 4-0 PS2 18 (SUTURE) ×2 IMPLANT
SUT VIC AB 1 CT1 36 (SUTURE) ×4 IMPLANT
SUT VIC AB 2-0 CT1 27 (SUTURE) ×4
SUT VIC AB 2-0 CT1 TAPERPNT 27 (SUTURE) ×2 IMPLANT
SUT VLOC 180 0 24IN GS25 (SUTURE) ×1 IMPLANT
TOWEL OR 17X26 10 PK STRL BLUE (TOWEL DISPOSABLE) ×4 IMPLANT
TRAY FOLEY CATH 14FRSI W/METER (CATHETERS) ×2 IMPLANT

## 2014-04-05 NOTE — Anesthesia Preprocedure Evaluation (Addendum)
Anesthesia Evaluation  Patient identified by MRN, date of birth, ID band Patient awake    Reviewed: Allergy & Precautions, NPO status , Patient's Chart, lab work & pertinent test results  Airway Mallampati: II  TM Distance: >3 FB Neck ROM: Full    Dental no notable dental hx.    Pulmonary neg pulmonary ROS,  breath sounds clear to auscultation  Pulmonary exam normal       Cardiovascular hypertension, + CAD, + Cardiac Stents and +CHF + pacemaker + Cardiac Defibrillator Rhythm:Regular Rate:Normal     Neuro/Psych negative neurological ROS  negative psych ROS   GI/Hepatic negative GI ROS, Neg liver ROS,   Endo/Other  negative endocrine ROS  Renal/GU negative Renal ROS  negative genitourinary   Musculoskeletal negative musculoskeletal ROS (+)   Abdominal   Peds negative pediatric ROS (+)  Hematology negative hematology ROS (+)   Anesthesia Other Findings   Reproductive/Obstetrics negative OB ROS                            Anesthesia Physical Anesthesia Plan  ASA: IV  Anesthesia Plan: General   Post-op Pain Management:    Induction: Intravenous  Airway Management Planned: Oral ETT  Additional Equipment:   Intra-op Plan:   Post-operative Plan: Extubation in OR  Informed Consent: I have reviewed the patients History and Physical, chart, labs and discussed the procedure including the risks, benefits and alternatives for the proposed anesthesia with the patient or authorized representative who has indicated his/her understanding and acceptance.   Dental advisory given  Plan Discussed with: CRNA and Surgeon  Anesthesia Plan Comments:         Anesthesia Quick Evaluation

## 2014-04-05 NOTE — Consult Note (Signed)
CARDIOLOGY CONSULT NOTE       Patient ID: [name] MRN: [id number] DOB/AGE: 1921/02/05 81 y.o.  Admit date: 04/04/2014 Referring Physician:  Susie Cassette Primary Physician: Ginette Otto, MD Primary Cardiologist:  Sharrell Ku Reason for Consultation:  Pre Op Eval  Active Problems:   Hip fracture   HPI:   Pleasant 81 yo leaving at [facility].  Larey Seat and broke her left hip.  Needs surgery this afternoon with Dr Charlann Boxer.  History of distant CAD stent in 2004.  Seen by Dr Fraser Din for a while.  DCM with EF 24% by  MRI in 2007.  Had AICD placed.  Has not been in hospital for CAD or CHF.  Sees Dr Ladona Ridgel yearly for device check.  3/13 AICD removed and PPM placed due to advanced age and no AICD firing.  2012 had non ischemic myovue and echo with EF 40% .  She denies chest pain, palpitations, dyspnea or syncope  Compliant meds.  Distant history of bilateral TKR;s with no complications.  Her son Jake Shark is with her and actively involved with her care.  Ambulating with walker prior to fall and reasonable QOL.    ROS All other systems reviewed and negative except as noted above  Past Medical History  Diagnosis Date  . Dyslipidemia   . Diverticulitis   . HTN (hypertension)   . Osteoporosis   . Spinal stenosis   . Chronic low back pain   . CAD (coronary artery disease)   . Pulmonary embolism   . Nonischemic cardiomyopathy   . Ulcer disease   . H/O: hysterectomy   . Right knee pain 05/15/12  . Contusion of foot, right 05/15/12  . Weight loss   . Memory disturbance 05/2012    MMSE 26/30, intact Clock test    Family History  Problem Relation Age of Onset  . Coronary artery disease Sister   . Heart attack Father 37  . Heart attack Mother   . Other Mother     abdominal blockage    History   Social History  . Marital Status: Widowed    Spouse Name: N/A    Number of Children: N/A  . Years of Education: N/A   Occupational History  . Not on file.   Social History Main Topics  .  Smoking status: Never Smoker   . Smokeless tobacco: Not on file  . Alcohol Use: No  . Drug Use: No  . Sexual Activity: Not on file   Other Topics Concern  . Not on file   Social History Narrative    Past Surgical History  Procedure Laterality Date  . Right shoulder repair  1985    Dr. [name]  . Left shoulder replacement  1991    Dr. [name]  . Appendectomy  2010  . Cataract extraction    . Colon resection  2005  . Back surgery    . Rotator cuff debridement  1999    Dr. [name]  . Coronary angioplasty with stent placement  01/2003  . Hemicolectomy    . Biv icd genertaor change out N/A 05/24/2011    Procedure: BIV ICD GENERTAOR CHANGE OUT;  Surgeon: [name], MD;  [facility] LAB;  Service: Cardiovascular;  Laterality: N/A;     . antiseptic oral rinse  7 mL Mouth Rinse BID  . carvedilol  6.25 mg Oral BID WC  . DULoxetine  30 mg Oral Daily  . febuxostat  40 mg Oral Daily  . fesoterodine  8 mg Oral Daily  . heparin  5,000 Units Subcutaneous 3 times per day  . pantoprazole  40 mg Oral Daily  . sodium chloride  3 mL Intravenous Q12H  . [START ON 04/06/2014] torsemide  10 mg Oral QODAY   . sodium chloride 50 mL/hr at 04/04/14 2254    Physical Exam: Blood pressure 105/60, pulse 90, temperature 97.9 F (36.6 C), temperature source Oral, resp. rate 18, height 5\' 2"  (1.575 m), weight 65.545 kg (144 lb 8 oz), SpO2 100 %.   Affect appropriate Frail elderly female  HEENT: normal Neck supple with no adenopathy JVP normal no bruits no thyromegaly Lungs clear with no wheezing and good diaphragmatic motion Heart:  S1/S2 no murmur, no rub, gallop or click PMI normal Abdomen: benighn, BS positve, no tenderness, no AAA no bruit.  No HSM or HJR Distal pulses intact with no bruits No edema Neuro non-focal Skin warm and dry Left hip fracture s/p bilateral knee replacements    Labs:   Lab Results  Component Value Date   WBC 16.4* 04/05/2014   HGB 13.0 04/05/2014   HCT  39.8 04/05/2014   MCV 93.6 04/05/2014   PLT 203 04/05/2014    Recent Labs Lab 04/05/14 0535  NA 144  K 3.9  CL 101  CO2 34*  BUN 15  CREATININE 0.83  CALCIUM 8.5  PROT 6.6  BILITOT 1.1  ALKPHOS 68  ALT 11  AST 21  GLUCOSE 124*   Lab Results  Component Value Date   CKTOTAL 116 09/13/2010   CKMB 10.9* 09/13/2010   TROPONINI <0.03 04/04/2014    Lab Results  Component Value Date   CHOL  12/12/2009    161        ATP III CLASSIFICATION:  <200     mg/dL   Desirable  606-301  mg/dL   Borderline High  >=601    mg/dL   High          Lab Results  Component Value Date   HDL 61 12/12/2009   Lab Results  Component Value Date   LDLCALC  12/12/2009    83        Total Cholesterol/HDL:CHD Risk Coronary Heart Disease Risk Table                     Men   Women  1/2 Average Risk   3.4   3.3  Average Risk       5.0   4.4  2 X Average Risk   9.6   7.1  3 X Average Risk  23.4   11.0        Use the calculated Patient Ratio above and the CHD Risk Table to determine the patient's CHD Risk.        ATP III CLASSIFICATION (LDL):  <100     mg/dL   Optimal  093-235  mg/dL   Near or Above                    Optimal  130-159  mg/dL   Borderline  573-220  mg/dL   High  >254     mg/dL   Very High   Lab Results  Component Value Date   TRIG 86 12/12/2009   Lab Results  Component Value Date   CHOLHDL 2.6 12/12/2009   No results found for: LDLDIRECT    Radiology: Ct Head Wo Contrast  04/04/2014   CLINICAL DATA:  patient comes from  [facility] with c/o left hip pain, shortening and external rotation post fall. Patient got up without her walker to close her front door. She had on panty hose and slipped and fell, landing on her left hip. Patient denies hitting her head or LOC. Patient denies neck and back pain  EXAM: CT HEAD WITHOUT CONTRAST  CT CERVICAL SPINE WITHOUT CONTRAST  TECHNIQUE: Multidetector CT imaging of the head and cervical spine was performed  following the standard protocol without intravenous contrast. Multiplanar CT image reconstructions of the cervical spine were also generated.  COMPARISON:  None.  FINDINGS: CT HEAD FINDINGS  Ventricles are normal configuration. There is ventricular and sulcal enlargement reflecting age related volume loss. No parenchymal masses or mass effect. There are patchy areas of white matter hypoattenuation consistent with mild chronic microvascular ischemic change.  There is no evidence of a recent cortical infarct.  There are no extra-axial masses or abnormal fluid collections.  No intracranial hemorrhage.  Visualized sinuses are clear.  No skull fracture.  CT CERVICAL SPINE FINDINGS  No fracture. No spondylolisthesis. Mild loss of disc height with disc bulging is noted most evident at C3-C4. There are facet degenerative changes that are most evident on the left at C3-C4. Moderate neural foraminal narrowing is noted on the left at this level. Bones are diffusely demineralized. Soft tissues are unremarkable. Lung apices are clear.  IMPRESSION: HEAD CT:  No acute intracranial abnormalities.  No skull fracture.  CERVICAL CT:  No fracture or acute finding.   Electronically Signed   By: Amie Portland M.D.   On: 04/04/2014 20:15   Ct Cervical Spine Wo Contrast  04/04/2014   CLINICAL DATA:  patient comes from [facility] with c/o left hip pain, shortening and external rotation post fall. Patient got up without her walker to close her front door. She had on panty hose and slipped and fell, landing on her left hip. Patient denies hitting her head or LOC. Patient denies neck and back pain  EXAM: CT HEAD WITHOUT CONTRAST  CT CERVICAL SPINE WITHOUT CONTRAST  TECHNIQUE: Multidetector CT imaging of the head and cervical spine was performed following the standard protocol without intravenous contrast. Multiplanar CT image reconstructions of the cervical spine were also generated.  COMPARISON:  None.  FINDINGS: CT HEAD  FINDINGS  Ventricles are normal configuration. There is ventricular and sulcal enlargement reflecting age related volume loss. No parenchymal masses or mass effect. There are patchy areas of white matter hypoattenuation consistent with mild chronic microvascular ischemic change.  There is no evidence of a recent cortical infarct.  There are no extra-axial masses or abnormal fluid collections.  No intracranial hemorrhage.  Visualized sinuses are clear.  No skull fracture.  CT CERVICAL SPINE FINDINGS  No fracture. No spondylolisthesis. Mild loss of disc height with disc bulging is noted most evident at C3-C4. There are facet degenerative changes that are most evident on the left at C3-C4. Moderate neural foraminal narrowing is noted on the left at this level. Bones are diffusely demineralized. Soft tissues are unremarkable. Lung apices are clear.  IMPRESSION: HEAD CT:  No acute intracranial abnormalities.  No skull fracture.  CERVICAL CT:  No fracture or acute finding.   Electronically Signed   By: Amie Portland M.D.   On: 04/04/2014 20:15   Dg Chest Port 1 View  04/04/2014   CLINICAL DATA:  preop for hx of left hip fracture. Pt fell at her assisted living facility today. Pt takes HTN meds.  Not diabetic. Nonsmoker. Pt has a pace maker that was placed several years ago. Pt gets SOB every now and again. No chest pains, coughing, congestion, nausea, vomiting, or fevers  EXAM: PORTABLE CHEST - 1 VIEW  COMPARISON:  10/09/2012  FINDINGS: The cardiac silhouette normal in size. Moderate to large hiatal hernia projects over the cardiac silhouette. Mediastinum otherwise normal in contour. No hilar masses. Left anterior chest wall biventricular cardioverter-defibrillator is stable.  Clear lungs.  No pleural effusion or pneumothorax.  Left shoulder prosthesis, incompletely imaged, appears well aligned and well seated. Advanced arthropathic changes of the right shoulder are stable. Bones are diffusely demineralized.   IMPRESSION: No acute cardiopulmonary disease.   Electronically Signed   By: Amie Portland M.D.   On: 04/04/2014 19:41   Dg Hip Unilat With Pelvis Min 4 Views Left  04/04/2014   CLINICAL DATA:  Patient comes from [facility] with c/o left hip pain, shortening and external rotation post fall. Patient got up without her walker to close her front door. She had on panty hose and slipped and fell  EXAM: DG HIP W/ PWLVIS 4+V*L*  COMPARISON:  None.  FINDINGS: There is a fracture of the left femoral neck. Fracture is mid cervical with a separate fracture component of the subcapital femoral neck along the superior margin of the femoral head. Shaft component has migrated superiorly by approximately 1.5 cm. There is varus angulation and approximately 30 degrees of apex anterior angulation.  Bones are extensively demineralized. There are old pubic rami fractures bilaterally. No other acute fractures. No dislocation.  IMPRESSION: Displaced fracture of the left femoral neck with varus and apex anterior angulation.   Electronically Signed   By: Amie Portland M.D.   On: 04/04/2014 19:32    EKG:  SR LBBB   ASSESSMENT AND PLAN:  Preop:  Moderate risk given age and cardiac history but acceptable given alternative of being non ambulatory.  Discussed with son and patient they are willing to proceed with surgery this afternoon CAD:  Distant history Non ischemic myovue 2012 no chest pain.  Chronic BBB  Troponin negative continue asa and beta blocker CHF:  History of with improved EF by echo 2012  Can consider updating echo post op if there are issues with breathing or CHF Euvolemic at present Pacer:  Has had normal function with Dr Ladona Ridgel    Will be rounded on by Dr [place] this week   Signed: Charlton Haws 04/05/2014, 10:05 AM

## 2014-04-05 NOTE — Progress Notes (Signed)
Received patient post Left hemiathroplasty, alert and oriented, no complaints of pain. Surgical site dressing dry and intact, icepack inplace.Family at bedside.

## 2014-04-05 NOTE — Progress Notes (Signed)
Clinical Social Work Department BRIEF PSYCHOSOCIAL ASSESSMENT 04/05/2014  Patient:  [name], [name]     Account Number:  [id number]     Admit date:  04/04/2014  Clinical Social Worker:  Orpah Greek  Date/Time:  04/05/2014 10:53 AM  Referred by:  Physician  Date Referred:  04/05/2014 Referred for  Other - See comment   Other Referral:   Admitted from: [facility]   Interview type:  Patient Other interview type:   and son, Jake Shark at bedside    PSYCHOSOCIAL DATA Living Status:  FACILITY Admitted from facility:  [facility] Level of care:  Assisted Living Primary support name:  Maryjane Rodelo (dtr-in-law) c#: [phone] Primary support relationship to patient:  CHILD, ADULT Degree of support available:   good    CURRENT CONCERNS Current Concerns  Post-Acute Placement   Other Concerns:    SOCIAL WORK ASSESSMENT / PLAN CSW received consult that patient was admitted from [facility].   Assessment/plan status:  Information/Referral to [facility] Other assessment/ plan:   Information/referral to community resources:   CSW completed FL2 and faxed information to [facility], confirmed with Lupita Leash (ph#: [phone]) at [facility] that they would be able to take patient when stable for discharge.    PATIENT'S/FAMILY'S RESPONSE TO PLAN OF CARE: Patient is scheduled for surgery @ 1625 today, patient & son at bedside agreeable with plan for [facility] at discharge. Patient states that she has been living there for the last 20 years and is familiar with their rehab.          Lincoln Maxin, LCSW [facility] Clinical Social Worker cell #: [phone]

## 2014-04-05 NOTE — Anesthesia Postprocedure Evaluation (Signed)
  Anesthesia Post-op Note  Patient: [name]  Procedure(s) Performed: Procedure(s) (LRB): ARTHROPLASTY BIPOLAR HIP (Left)  Patient Location: PACU  Anesthesia Type: General  Level of Consciousness: awake and alert   Airway and Oxygen Therapy: Patient Spontanous Breathing  Post-op Pain: mild  Post-op Assessment: Post-op Vital signs reviewed, Patient's Cardiovascular Status Stable, Respiratory Function Stable, Patent Airway and No signs of Nausea or vomiting  Last Vitals:  Filed Vitals:   04/05/14 1800  BP: 114/62  Pulse: 87  Temp:   Resp: 19    Post-op Vital Signs: stable   Complications: No apparent anesthesia complications

## 2014-04-05 NOTE — Progress Notes (Signed)
UR completed 

## 2014-04-05 NOTE — Transfer of Care (Signed)
Immediate Anesthesia Transfer of Care Note  Patient: [name]  Procedure(s) Performed: Procedure(s): ARTHROPLASTY BIPOLAR HIP (Left)  Patient Location: PACU  Anesthesia Type:General  Level of Consciousness: awake, alert , oriented and patient cooperative  Airway & Oxygen Therapy: Patient Spontanous Breathing and Patient connected to face mask oxygen  Post-op Assessment: Report given to PACU RN, Post -op Vital signs reviewed and stable and Patient moving all extremities X 4  Post vital signs: stable  Complications: No apparent anesthesia complications

## 2014-04-05 NOTE — Progress Notes (Addendum)
[facility] PROGRESS NOTE  [name] PJK:[id number] DOB: 1920-04-15 DOA: 04/04/2014 PCP: Ginette Otto, MD  Assessment/Plan: Active Problems:   Hip fracture   Preop cardiovascular exam    Hip fracture Seen by Dr. Charlann Boxer Expected to go to the OR this afternoon   Status post pacemaker, chronic BPV, nonischemic cardiomyopathy. Moderate risk given age and cardiac history but acceptable given alternative of being non ambulatory. Discussed with son and patient they are willing to proceed with surgery this afternoon Appreciate cardiology input   Leukocytosis Secondary to stress margination, UA, chest x-ray negative Follow-up   CAD: Distant history Non ischemic myovue 2012 no chest pain. Chronic BBB Troponin negative continue asa and beta blocker  CHF: History of with improved EF by echo 2012  Repeat 2-D echo this admission given last 2-D echo was in 2012 Check TSH  Pacer: Has had normal function with Dr Ladona Ridgel     Code Status: DO NOT RESUSCITATE Family Communication: family updated about patient's clinical progress Disposition Plan:  As above    Brief narrative: Pleasant 81 yo leaving at [facility]. Larey Seat and broke her left hip. Needs surgery this afternoon with Dr Charlann Boxer. History of distant CAD stent in 2004. Seen by Dr Fraser Din for a while. DCM with EF 24% by  MRI in 2007. Had AICD placed. Has not been in hospital for CAD or CHF. Sees Dr Ladona Ridgel yearly for device check. 3/13 AICD removed and PPM placed due to advanced age and no AICD firing. 2012 had non ischemic myovue and echo with EF 40% . She denies chest pain, palpitations, dyspnea or syncope Compliant meds. Distant history of bilateral TKR;s with no complications. Her son Jake Shark is with her and actively involved with her care. Ambulating with walker prior to fall and reasonable QOL.     Consultants:  Cardiology  Orthopedics  Procedures:  None  Antibiotics: None  HPI/Subjective: Son by the bedside, minimal complaints, denies any chest pain or shortness of breath in the last several months Denies dizziness prior to the fall  Objective: Filed Vitals:   04/04/14 1833 04/04/14 1947 04/04/14 2218 04/05/14 0515  BP: 139/87 132/104 131/76 105/60  Pulse: 82 77 91 90  Temp: 98.6 F (37 C)  98.3 F (36.8 C) 97.9 F (36.6 C)  TempSrc: Oral  Oral Oral  Resp: 18 18 16 18   Height:   5\' 2"  (1.575 m)   Weight:   65.545 kg (144 lb 8 oz)   SpO2: 96% 95% 96% 100%    Intake/Output Summary (Last 24 hours) at 04/05/14 1020 Last data filed at 04/05/14 0600  Gross per 24 hour  Intake    355 ml  Output    500 ml  Net   -145 ml    Exam:  General: alert & oriented x 3 In NAD  Cardiovascular: RRR, nl S1 s2  Respiratory: Decreased breath sounds at the bases, scattered rhonchi, no crackles  Abdomen: soft +BS NT/ND, no masses palpable  Extremities: No cyanosis and no edema      Data Reviewed: Basic Metabolic Panel:  Recent Labs Lab 04/04/14 1928 04/04/14 2125 04/05/14 0535  NA 139  --  144  K 3.6  --  3.9  CL 104  --  101  CO2 30  --  34*  GLUCOSE 108*  --  124*  BUN 15  --  15  CREATININE 0.84 0.70 0.83  CALCIUM 8.3*  --  8.5    Liver Function Tests:  Recent Labs  Lab 04/04/14 1928 04/05/14 0535  AST 20 21  ALT 11 11  ALKPHOS 70 68  BILITOT 0.7 1.1  PROT 6.5 6.6  ALBUMIN 3.7 3.8   No results for input(s): LIPASE, AMYLASE in the last 168 hours. No results for input(s): AMMONIA in the last 168 hours.  CBC:  Recent Labs Lab 04/04/14 1928 04/04/14 2125 04/05/14 0535  WBC 11.0* 13.6* 16.4*  NEUTROABS 8.5*  --  14.4*  HGB 12.9 12.7 13.0  HCT 39.4 38.9 39.8  MCV 92.5 93.1 93.6  PLT 207 222 203    Cardiac Enzymes:  Recent Labs Lab 04/04/14 1928  TROPONINI <0.03   BNP (last 3 results) No results for input(s): PROBNP in the  last 8760 hours.   CBG: No results for input(s): GLUCAP in the last 168 hours.  No results found for this or any previous visit (from the past 240 hour(s)).   Studies: Ct Head Wo Contrast  04/04/2014   CLINICAL DATA:  patient comes from [facility] with c/o left hip pain, shortening and external rotation post fall. Patient got up without her walker to close her front door. She had on panty hose and slipped and fell, landing on her left hip. Patient denies hitting her head or LOC. Patient denies neck and back pain  EXAM: CT HEAD WITHOUT CONTRAST  CT CERVICAL SPINE WITHOUT CONTRAST  TECHNIQUE: Multidetector CT imaging of the head and cervical spine was performed following the standard protocol without intravenous contrast. Multiplanar CT image reconstructions of the cervical spine were also generated.  COMPARISON:  None.  FINDINGS: CT HEAD FINDINGS  Ventricles are normal configuration. There is ventricular and sulcal enlargement reflecting age related volume loss. No parenchymal masses or mass effect. There are patchy areas of white matter hypoattenuation consistent with mild chronic microvascular ischemic change.  There is no evidence of a recent cortical infarct.  There are no extra-axial masses or abnormal fluid collections.  No intracranial hemorrhage.  Visualized sinuses are clear.  No skull fracture.  CT CERVICAL SPINE FINDINGS  No fracture. No spondylolisthesis. Mild loss of disc height with disc bulging is noted most evident at C3-C4. There are facet degenerative changes that are most evident on the left at C3-C4. Moderate neural foraminal narrowing is noted on the left at this level. Bones are diffusely demineralized. Soft tissues are unremarkable. Lung apices are clear.  IMPRESSION: HEAD CT:  No acute intracranial abnormalities.  No skull fracture.  CERVICAL CT:  No fracture or acute finding.   Electronically Signed   By: Amie Portland M.D.   On: 04/04/2014 20:15   Ct Cervical  Spine Wo Contrast  04/04/2014   CLINICAL DATA:  patient comes from [facility] with c/o left hip pain, shortening and external rotation post fall. Patient got up without her walker to close her front door. She had on panty hose and slipped and fell, landing on her left hip. Patient denies hitting her head or LOC. Patient denies neck and back pain  EXAM: CT HEAD WITHOUT CONTRAST  CT CERVICAL SPINE WITHOUT CONTRAST  TECHNIQUE: Multidetector CT imaging of the head and cervical spine was performed following the standard protocol without intravenous contrast. Multiplanar CT image reconstructions of the cervical spine were also generated.  COMPARISON:  None.  FINDINGS: CT HEAD FINDINGS  Ventricles are normal configuration. There is ventricular and sulcal enlargement reflecting age related volume loss. No parenchymal masses or mass effect. There are patchy areas of white matter hypoattenuation consistent with  mild chronic microvascular ischemic change.  There is no evidence of a recent cortical infarct.  There are no extra-axial masses or abnormal fluid collections.  No intracranial hemorrhage.  Visualized sinuses are clear.  No skull fracture.  CT CERVICAL SPINE FINDINGS  No fracture. No spondylolisthesis. Mild loss of disc height with disc bulging is noted most evident at C3-C4. There are facet degenerative changes that are most evident on the left at C3-C4. Moderate neural foraminal narrowing is noted on the left at this level. Bones are diffusely demineralized. Soft tissues are unremarkable. Lung apices are clear.  IMPRESSION: HEAD CT:  No acute intracranial abnormalities.  No skull fracture.  CERVICAL CT:  No fracture or acute finding.   Electronically Signed   By: Amie Portland M.D.   On: 04/04/2014 20:15   Dg Chest Port 1 View  04/04/2014   CLINICAL DATA:  preop for hx of left hip fracture. Pt fell at her assisted living facility today. Pt takes HTN meds. Not diabetic. Nonsmoker. Pt has a pace  maker that was placed several years ago. Pt gets SOB every now and again. No chest pains, coughing, congestion, nausea, vomiting, or fevers  EXAM: PORTABLE CHEST - 1 VIEW  COMPARISON:  10/09/2012  FINDINGS: The cardiac silhouette normal in size. Moderate to large hiatal hernia projects over the cardiac silhouette. Mediastinum otherwise normal in contour. No hilar masses. Left anterior chest wall biventricular cardioverter-defibrillator is stable.  Clear lungs.  No pleural effusion or pneumothorax.  Left shoulder prosthesis, incompletely imaged, appears well aligned and well seated. Advanced arthropathic changes of the right shoulder are stable. Bones are diffusely demineralized.  IMPRESSION: No acute cardiopulmonary disease.   Electronically Signed   By: Amie Portland M.D.   On: 04/04/2014 19:41   Dg Hip Unilat With Pelvis Min 4 Views Left  04/04/2014   CLINICAL DATA:  Patient comes from [facility] with c/o left hip pain, shortening and external rotation post fall. Patient got up without her walker to close her front door. She had on panty hose and slipped and fell  EXAM: DG HIP W/ PWLVIS 4+V*L*  COMPARISON:  None.  FINDINGS: There is a fracture of the left femoral neck. Fracture is mid cervical with a separate fracture component of the subcapital femoral neck along the superior margin of the femoral head. Shaft component has migrated superiorly by approximately 1.5 cm. There is varus angulation and approximately 30 degrees of apex anterior angulation.  Bones are extensively demineralized. There are old pubic rami fractures bilaterally. No other acute fractures. No dislocation.  IMPRESSION: Displaced fracture of the left femoral neck with varus and apex anterior angulation.   Electronically Signed   By: Amie Portland M.D.   On: 04/04/2014 19:32    Scheduled Meds: . antiseptic oral rinse  7 mL Mouth Rinse BID  . carvedilol  6.25 mg Oral BID WC  . DULoxetine  30 mg Oral Daily  . febuxostat   40 mg Oral Daily  . fesoterodine  8 mg Oral Daily  . heparin  5,000 Units Subcutaneous 3 times per day  . pantoprazole  40 mg Oral Daily  . sodium chloride  3 mL Intravenous Q12H  . [START ON 04/06/2014] torsemide  10 mg Oral QODAY  . vitamin A & D       Continuous Infusions: . sodium chloride 50 mL/hr at 04/04/14 2254    Active Problems:   Hip fracture   Preop cardiovascular exam    Time  spent: 40 minutes   [facility]  [facility] Pager [phone]. If 7PM-7AM, please contact night-coverage at [email or web address], password [facility] 04/05/2014, 10:20 AM  LOS: 1 day

## 2014-04-05 NOTE — Op Note (Signed)
NAME:  [name], [name]                ACCOUNT NO.:  [id number]   MEDICAL RECORD NO.: [id number]   LOCATION:  1435                         FACILITY:  [facility]   DATE OF BIRTH:  01/27/1921  PHYSICIAN:  Madlyn Frankel. Charlann Boxer, M.D.     DATE OF PROCEDURE:  04/05/14                               OPERATIVE REPORT     PREOPERATIVE DIAGNOSIS:  Left displaced femoral neck fracture.   POSTOPERATIVE DIAGNOSIS:  Left displaced femoral neck fracture.   PROCEDURE:  Left hip hemiarthroplasty utilizing DePuy component, size 7 standard [employer] Basic stem with a 21mm unipolar ball with a +0 adapter.   SURGEON:  Madlyn Frankel. Charlann Boxer, MD   ASSISTANT:  Lanney Gins, PA-C.   ANESTHESIA:  General.   SPECIMENS:  None.   DRAINS:  None.   BLOOD LOSS:  About 100 cc.   COMPLICATIONS:  None.   INDICATION OF PROCEDURE:  [name] is a pleasant 81 year old female who lives at [facility].  She unfortunately had a fall yesterday and was unable to get up due to pain.  She was admitted to the hospital after radiographs revealed a displace left femoral neck fracture.  She was seen and evaluated and was scheduled for surgery for fixation.  The necessity of surgical repair was discussed with she and her family.  Consent was obtained after reviewing risks of infection, DVT, component failure, and need for revision surgery.   PROCEDURE IN DETAIL:  The patient was brought to the operative theater. Once adequate anesthesia, preoperative antibiotics, 2 g of Ancef administered, the patient was positioned into the right lateral decubitus position with the left side up.  The left lower extremity was then prepped and draped in sterile fashion.  A time-out was performed identifying the patient, planned procedure, and extremity.   A lateral incision was made off the proximal trochanter. Sharp dissection was carried down to the iliotibial band and gluteal fascia. The gluteal fascia was then incised for posterior  approach.  The short external rotators were taken down separate from the posterior capsule. An L capsulotomy was made preserving the posterior leaflet for later anatomic repair. Fracture site was identified and after removing comminuted segments of the posterior femoral neck, the femoral head was removed without difficulty and measured on the back table  using the sizing rings and determined to be 48 mm in diameter.   The proximal femur was then exposed.  Retractors placed.  I then drilled, opened the proximal femur.  Then I hand reamed once and  Irrigated the canal to try to prevent fat emboli.  I began broaching the femur with a starting 1 broach up to a size 7 broach with good medial and lateral metaphyseal fit without evidence of any torsion or movement.  A trial reduction was carried out with a standard neck and a +0 adapter with a 26mm ball.  The hip reduced nicely.  The leg lengths appeared to be equal compared to the down leg.   The hip went through a range of motion without evidence of any subluxation or impingement.   Given these findings, the trial components removed.  The final 7 standard  [employer] Basic stem was opened.  After irrigating the canal, the final stem was impacted and sat at the level where the broach was. Based on this and the trial reduction, a +0 adapter was opened and impacted in the 48mm unipolar ball onto a clean and dry trunnion.  The hip had been irrigated throughout the case and again at this point.  I re- Approximated the posterior capsule to the superior leaflet using a  #1 Vicryl.  The remainder of the wound was closed with #1 Vicryl and #0 V-lock sutures in the iliotibial band and gluteal fascia, a  2-0 Vicryl in the sub-Q tissue and a running 4-0 Monocryl in the skin.  The hip was cleaned, dried, and dressed sterilely using Dermabond and Aquacel dressing.  She was then brought to recovery room, extubated in stable condition, tolerating the  procedure well.  Lanney Gins, PA-C was present and utilized as Geophysicist/field seismologist for the entire case from  Preoperative positioning to management of the contralateral extremity and retractors to  General facilitation of the procedure.  He was also involved with primary wound closure.         Madlyn Frankel Charlann Boxer, M.D.

## 2014-04-05 NOTE — Anesthesia Procedure Notes (Signed)
Procedure Name: Intubation Date/Time: 04/05/2014 4:15 PM Performed by: Epimenio Sarin Pre-anesthesia Checklist: Patient identified, Emergency Drugs available, Suction available, Patient being monitored and Timeout performed Patient Re-evaluated:Patient Re-evaluated prior to inductionOxygen Delivery Method: Circle system utilized Preoxygenation: Pre-oxygenation with 100% oxygen Intubation Type: IV induction Ventilation: Mask ventilation without difficulty Laryngoscope Size: Mac and 3 Grade View: Grade I Tube type: Oral Tube size: 7.5 mm Number of attempts: 1 Airway Equipment and Method: Stylet Placement Confirmation: ETT inserted through vocal cords under direct vision,  positive ETCO2 and breath sounds checked- equal and bilateral Secured at: 21 cm Tube secured with: Tape Dental Injury: Teeth and Oropharynx as per pre-operative assessment

## 2014-04-05 NOTE — Consult Note (Signed)
Reason for Consult: Left femoral neck fracture Referring Physician:  Susie Cassette, MD  [name] is an 81 y.o. female.  HPI: [name] is a pleasant 81 yo female who resides at [facility] in assisted living.  She had a ground level fall onto her left hip.  She had immediate onset of pain, inability to bear weight.  She was brought to the ER where X-rays revealed displaced left femoral neck fracture.  She was admitted to the Hospitalist service per fracture pathway.  Ortho consulted for definitive management.    Seen and evaluated this am.  Her son was in room with her.   No other complaints this am  Past Medical History  Diagnosis Date  . Dyslipidemia   . Diverticulitis   . HTN (hypertension)   . Osteoporosis   . Spinal stenosis   . Chronic low back pain   . CAD (coronary artery disease)   . Pulmonary embolism   . Nonischemic cardiomyopathy   . Ulcer disease   . H/O: hysterectomy   . Right knee pain 05/15/12  . Contusion of foot, right 05/15/12  . Weight loss   . Memory disturbance 05/2012    MMSE 26/30, intact Clock test    Past Surgical History  Procedure Laterality Date  . Right shoulder repair  1985    Dr. [name]  . Left shoulder replacement  1991    Dr. [name]  . Appendectomy  2010  . Cataract extraction    . Colon resection  2005  . Back surgery    . Rotator cuff debridement  1999    Dr. [name]  . Coronary angioplasty with stent placement  01/2003  . Hemicolectomy    . Biv icd genertaor change out N/A 05/24/2011    Procedure: BIV ICD GENERTAOR CHANGE OUT;  Surgeon: [name], MD;  Location: [facility] CATH LAB;  Service: Cardiovascular;  Laterality: N/A;    Family History  Problem Relation Age of Onset  . Coronary artery disease Sister   . Heart attack Father 73  . Heart attack Mother   . Other Mother     abdominal blockage    Social History:  reports that she has never smoked. She does not have any smokeless tobacco history on file. She reports that she does not  drink alcohol or use illicit drugs.  Allergies:  Allergies  Allergen Reactions  . Arthrotec [Diclofenac-Misoprostol]   . Ibuprofen   . Lactose Intolerance (Gi)   . Milk-Related Compounds Nausea And Vomiting  . Other     Grass and ragweed  . Oxycontin [Oxycodone Hcl]   . Pollen Extract     Medications:  I have reviewed the patient's current medications. Scheduled: . antiseptic oral rinse  7 mL Mouth Rinse BID  . carvedilol  6.25 mg Oral BID WC  . DULoxetine  30 mg Oral Daily  . febuxostat  40 mg Oral Daily  . fesoterodine  8 mg Oral Daily  . heparin  5,000 Units Subcutaneous 3 times per day  . pantoprazole  40 mg Oral Daily  . sodium chloride  3 mL Intravenous Q12H  . [START ON 04/06/2014] torsemide  10 mg Oral QODAY    Results for orders placed or performed during the hospital encounter of 04/04/14 (from the past 24 hour(s))  CBC with Differential     Status: Abnormal   Collection Time: 04/04/14  7:28 PM  Result Value Ref Range   WBC 11.0 (H) 4.0 - 10.5 K/uL  RBC 4.26 3.87 - 5.11 MIL/uL   Hemoglobin 12.9 12.0 - 15.0 g/dL   HCT 16.1 09.6 - 04.5 %   MCV 92.5 78.0 - 100.0 fL   MCH 30.3 26.0 - 34.0 pg   MCHC 32.7 30.0 - 36.0 g/dL   RDW 40.9 81.1 - 91.4 %   Platelets 207 150 - 400 K/uL   Neutrophils Relative % 76 43 - 77 %   Neutro Abs 8.5 (H) 1.7 - 7.7 K/uL   Lymphocytes Relative 14 12 - 46 %   Lymphs Abs 1.5 0.7 - 4.0 K/uL   Monocytes Relative 7 3 - 12 %   Monocytes Absolute 0.8 0.1 - 1.0 K/uL   Eosinophils Relative 2 0 - 5 %   Eosinophils Absolute 0.2 0.0 - 0.7 K/uL   Basophils Relative 1 0 - 1 %   Basophils Absolute 0.1 0.0 - 0.1 K/uL  Comprehensive metabolic panel     Status: Abnormal   Collection Time: 04/04/14  7:28 PM  Result Value Ref Range   Sodium 139 135 - 145 mmol/L   Potassium 3.6 3.5 - 5.1 mmol/L   Chloride 104 96 - 112 mEq/L   CO2 30 19 - 32 mmol/L   Glucose, Bld 108 (H) 70 - 99 mg/dL   BUN 15 6 - 23 mg/dL   Creatinine, Ser 7.82 0.50 - 1.10  mg/dL   Calcium 8.3 (L) 8.4 - 10.5 mg/dL   Total Protein 6.5 6.0 - 8.3 g/dL   Albumin 3.7 3.5 - 5.2 g/dL   AST 20 0 - 37 U/L   ALT 11 0 - 35 U/L   Alkaline Phosphatase 70 39 - 117 U/L   Total Bilirubin 0.7 0.3 - 1.2 mg/dL   GFR calc non Af Amer 59 (L) >90 mL/min   GFR calc Af Amer 68 (L) >90 mL/min   Anion gap 5 5 - 15  Troponin I     Status: None   Collection Time: 04/04/14  7:28 PM  Result Value Ref Range   Troponin I <0.03 <0.031 ng/mL  CBC     Status: Abnormal   Collection Time: 04/04/14  9:25 PM  Result Value Ref Range   WBC 13.6 (H) 4.0 - 10.5 K/uL   RBC 4.18 3.87 - 5.11 MIL/uL   Hemoglobin 12.7 12.0 - 15.0 g/dL   HCT 95.6 21.3 - 08.6 %   MCV 93.1 78.0 - 100.0 fL   MCH 30.4 26.0 - 34.0 pg   MCHC 32.6 30.0 - 36.0 g/dL   RDW 57.8 46.9 - 62.9 %   Platelets 222 150 - 400 K/uL  Creatinine, serum     Status: Abnormal   Collection Time: 04/04/14  9:25 PM  Result Value Ref Range   Creatinine, Ser 0.70 0.50 - 1.10 mg/dL   GFR calc non Af Amer 73 (L) >90 mL/min   GFR calc Af Amer 85 (L) >90 mL/min  Urinalysis, Routine w reflex microscopic     Status: Abnormal   Collection Time: 04/04/14 10:28 PM  Result Value Ref Range   Color, Urine YELLOW YELLOW   APPearance CLOUDY (A) CLEAR   Specific Gravity, Urine 1.008 1.005 - 1.030   pH 5.5 5.0 - 8.0   Glucose, UA NEGATIVE NEGATIVE mg/dL   Hgb urine dipstick NEGATIVE NEGATIVE   Bilirubin Urine NEGATIVE NEGATIVE   Ketones, ur NEGATIVE NEGATIVE mg/dL   Protein, ur NEGATIVE NEGATIVE mg/dL   Urobilinogen, UA 0.2 0.0 - 1.0 mg/dL   Nitrite NEGATIVE  NEGATIVE   Leukocytes, UA TRACE (A) NEGATIVE  Urine microscopic-add on     Status: Abnormal   Collection Time: 04/04/14 10:28 PM  Result Value Ref Range   Squamous Epithelial / LPF RARE RARE   WBC, UA 3-6 <3 WBC/hpf   RBC / HPF 0-2 <3 RBC/hpf   Bacteria, UA MANY (A) RARE   Casts HYALINE CASTS (A) NEGATIVE  Comprehensive metabolic panel     Status: Abnormal   Collection Time: 04/05/14   5:35 AM  Result Value Ref Range   Sodium 144 135 - 145 mmol/L   Potassium 3.9 3.5 - 5.1 mmol/L   Chloride 101 96 - 112 mEq/L   CO2 34 (H) 19 - 32 mmol/L   Glucose, Bld 124 (H) 70 - 99 mg/dL   BUN 15 6 - 23 mg/dL   Creatinine, Ser 1.61 0.50 - 1.10 mg/dL   Calcium 8.5 8.4 - 09.6 mg/dL   Total Protein 6.6 6.0 - 8.3 g/dL   Albumin 3.8 3.5 - 5.2 g/dL   AST 21 0 - 37 U/L   ALT 11 0 - 35 U/L   Alkaline Phosphatase 68 39 - 117 U/L   Total Bilirubin 1.1 0.3 - 1.2 mg/dL   GFR calc non Af Amer 60 (L) >90 mL/min   GFR calc Af Amer 69 (L) >90 mL/min   Anion gap 9 5 - 15  CBC WITH DIFFERENTIAL     Status: Abnormal   Collection Time: 04/05/14  5:35 AM  Result Value Ref Range   WBC 16.4 (H) 4.0 - 10.5 K/uL   RBC 4.25 3.87 - 5.11 MIL/uL   Hemoglobin 13.0 12.0 - 15.0 g/dL   HCT 04.5 40.9 - 81.1 %   MCV 93.6 78.0 - 100.0 fL   MCH 30.6 26.0 - 34.0 pg   MCHC 32.7 30.0 - 36.0 g/dL   RDW 91.4 78.2 - 95.6 %   Platelets 203 150 - 400 K/uL   Neutrophils Relative % 87 (H) 43 - 77 %   Neutro Abs 14.4 (H) 1.7 - 7.7 K/uL   Lymphocytes Relative 6 (L) 12 - 46 %   Lymphs Abs 1.0 0.7 - 4.0 K/uL   Monocytes Relative 6 3 - 12 %   Monocytes Absolute 0.9 0.1 - 1.0 K/uL   Eosinophils Relative 1 0 - 5 %   Eosinophils Absolute 0.1 0.0 - 0.7 K/uL   Basophils Relative 0 0 - 1 %   Basophils Absolute 0.1 0.0 - 0.1 K/uL    X-ray: CLINICAL DATA: Patient comes from [facility] independent living with c/o left hip pain, shortening and external rotation post fall. Patient got up without her walker to close her front door. She had on panty hose and slipped and fell  EXAM: DG HIP W/ PWLVIS 4+V*L*  COMPARISON: None.  FINDINGS: There is a fracture of the left femoral neck. Fracture is mid cervical with a separate fracture component of the subcapital femoral neck along the superior margin of the femoral head. Shaft component has migrated superiorly by approximately 1.5 cm. There is varus angulation and  approximately 30 degrees of apex anterior angulation.  Bones are extensively demineralized. There are old pubic rami fractures bilaterally. No other acute fractures. No dislocation.  IMPRESSION: Displaced fracture of the left femoral neck with varus and apex anterior angulation.   Electronically Signed  By: Amie Portland M.D.  Review of Systems  Constitutional: Negative for fever, chills, weight loss and diaphoresis.  Respiratory: Negative for cough, sputum production  and shortness of breath.   Cardiovascular: Negative for chest pain and leg swelling.  Gastrointestinal: Negative for heartburn, nausea, vomiting and abdominal pain.  Musculoskeletal: Positive for joint pain.       Previous evaluations for knee pains  Neurological: Negative for headaches.    Blood pressure 105/60, pulse 90, temperature 97.9 F (36.6 C), temperature source Oral, resp. rate 18, height 5\' 2"  (1.575 m), weight 65.545 kg (144 lb 8 oz), SpO2 100 %.  Physical Exam  Awake alert General medical exam deferred to admission, reviewed No acute distress\  Moving right leg freely Left leg slightly shortened, externally rotated  No significant LE sweeling  Assessment/Plan: Displaced left femoral neck fracture  NPO Consent ordered Plan to go to OR this pm unless medical contraindication identified  Reviewed with patient and her son indications for the procedure as well as post-hospital expectations, etc  Huxton Glaus D 04/05/2014, 9:01 AM

## 2014-04-06 ENCOUNTER — Encounter (HOSPITAL_COMMUNITY): Payer: Self-pay | Admitting: Orthopedic Surgery

## 2014-04-06 DIAGNOSIS — Z950 Presence of cardiac pacemaker: Secondary | ICD-10-CM

## 2014-04-06 DIAGNOSIS — I429 Cardiomyopathy, unspecified: Secondary | ICD-10-CM

## 2014-04-06 LAB — COMPREHENSIVE METABOLIC PANEL
ALT: 10 U/L (ref 0–35)
AST: 20 U/L (ref 0–37)
Albumin: 2.9 g/dL — ABNORMAL LOW (ref 3.5–5.2)
Alkaline Phosphatase: 56 U/L (ref 39–117)
Anion gap: 6 (ref 5–15)
BILIRUBIN TOTAL: 0.8 mg/dL (ref 0.3–1.2)
BUN: 15 mg/dL (ref 6–23)
CALCIUM: 7.8 mg/dL — AB (ref 8.4–10.5)
CHLORIDE: 103 meq/L (ref 96–112)
CO2: 30 mmol/L (ref 19–32)
Creatinine, Ser: 0.75 mg/dL (ref 0.50–1.10)
GFR calc Af Amer: 83 mL/min — ABNORMAL LOW (ref >90)
GFR calc non Af Amer: 72 mL/min — ABNORMAL LOW (ref >90)
GLUCOSE: 148 mg/dL — AB (ref 70–99)
POTASSIUM: 3.5 mmol/L (ref 3.5–5.1)
Sodium: 139 mmol/L (ref 135–145)
TOTAL PROTEIN: 5.5 g/dL — AB (ref 6.0–8.3)

## 2014-04-06 LAB — CBC
HCT: 33.2 % — ABNORMAL LOW (ref 36.0–46.0)
Hemoglobin: 11 g/dL — ABNORMAL LOW (ref 12.0–15.0)
MCH: 30.7 pg (ref 26.0–34.0)
MCHC: 33.1 g/dL (ref 30.0–36.0)
MCV: 92.7 fL (ref 78.0–100.0)
Platelets: 161 10*3/uL (ref 150–400)
RBC: 3.58 MIL/uL — AB (ref 3.87–5.11)
RDW: 13.3 % (ref 11.5–15.5)
WBC: 12.3 10*3/uL — ABNORMAL HIGH (ref 4.0–10.5)

## 2014-04-06 LAB — TSH: TSH: 1.266 u[IU]/mL (ref 0.350–4.500)

## 2014-04-06 MED ORDER — TRAMADOL-ACETAMINOPHEN 37.5-325 MG PO TABS: 1.0000 | ORAL_TABLET | Freq: Four times a day (QID) | ORAL | Status: DC | PRN

## 2014-04-06 MED ORDER — HYDROCODONE-ACETAMINOPHEN 5-325 MG PO TABS: 1.0000 | ORAL_TABLET | Freq: Four times a day (QID) | ORAL | Status: DC | PRN

## 2014-04-06 MED ORDER — SODIUM CHLORIDE 0.9 % IV SOLN
INTRAVENOUS | Status: DC
  Administered 2014-04-06: 05:00:00 via INTRAVENOUS

## 2014-04-06 MED ORDER — ACETAMINOPHEN 325 MG PO TABS: 650.0000 mg | ORAL_TABLET | Freq: Four times a day (QID) | ORAL | Status: DC | PRN

## 2014-04-06 MED ORDER — DSS 100 MG PO CAPS: 100.0000 mg | ORAL_CAPSULE | Freq: Two times a day (BID) | ORAL | Status: DC

## 2014-04-06 NOTE — Progress Notes (Signed)
OT Note  Patient Details Name: [name] MRN: [id number] DOB: 03-Sep-1920   Cancelled Treatment:     Noted plan to go to [facility] in house rehab upon DC. Will defer OT eval to [facility]. Thanks,  Einar Crow D 04/06/2014, 10:04 AM

## 2014-04-06 NOTE — Progress Notes (Signed)
TELEMETRY: Reviewed telemetry pt in NSR with rare PVC, pace beat: Filed Vitals:   04/05/14 1822 04/05/14 2111 04/06/14 0010 04/06/14 0438  BP: 108/56 101/57 111/66 109/61  Pulse: 87 86 85 77  Temp: 97.6 F (36.4 C) 98.5 F (36.9 C) 97.9 F (36.6 C) 97.4 F (36.3 C)  TempSrc:  Oral Oral Oral  Resp: 18 18 18 18   Height:      Weight:    152 lb 3.2 oz (69.037 kg)  SpO2: 96% 96% 97% 97%    Intake/Output Summary (Last 24 hours) at 04/06/14 0740 Last data filed at 04/06/14 0700  Gross per 24 hour  Intake 2893.75 ml  Output    390 ml  Net 2503.75 ml   Filed Weights   04/04/14 2218 04/06/14 0438  Weight: 144 lb 8 oz (65.545 kg) 152 lb 3.2 oz (69.037 kg)    Subjective Feels well this am. Resting comfortably. No chest pain or SOB.   Marland Kitchen antiseptic oral rinse  7 mL Mouth Rinse BID  . aspirin EC  81 mg Oral Daily  . carvedilol  6.25 mg Oral BID WC  . docusate sodium  100 mg Oral BID  . DULoxetine  30 mg Oral Daily  . enoxaparin (LOVENOX) injection  30 mg Subcutaneous Q24H  . febuxostat  40 mg Oral Daily  . fesoterodine  8 mg Oral Daily  . pantoprazole  40 mg Oral Daily  . sodium chloride  3 mL Intravenous Q12H  . torsemide  10 mg Oral QODAY  . cyanocobalamin  1,000 mcg Oral Daily   . sodium chloride 75 mL/hr at 04/06/14 0430  . sodium chloride 0.9 % 1,000 mL with potassium chloride 10 mEq infusion      LABS: Basic Metabolic Panel:  Recent Labs  85/46/27 0535 04/06/14 0540  NA 144 139  K 3.9 3.5  CL 101 103  CO2 34* 30  GLUCOSE 124* 148*  BUN 15 15  CREATININE 0.83 0.75  CALCIUM 8.5 7.8*   Liver Function Tests:  Recent Labs  04/05/14 0535 04/06/14 0540  AST 21 20  ALT 11 10  ALKPHOS 68 56  BILITOT 1.1 0.8  PROT 6.6 5.5*  ALBUMIN 3.8 2.9*   No results for input(s): LIPASE, AMYLASE in the last 72 hours. CBC:  Recent Labs  04/04/14 1928  04/05/14 0535 04/06/14 0540  WBC 11.0*  < > 16.4* 12.3*  NEUTROABS 8.5*  --  14.4*  --   HGB 12.9  < > 13.0  11.0*  HCT 39.4  < > 39.8 33.2*  MCV 92.5  < > 93.6 92.7  PLT 207  < > 203 161  < > = values in this interval not displayed. Cardiac Enzymes:  Recent Labs  04/04/14 1928 04/05/14 0953  TROPONINI <0.03 0.03     Radiology/Studies:  Ct Head Wo Contrast  04/04/2014   CLINICAL DATA:  patient comes from [facility] independent living with c/o left hip pain, shortening and external rotation post fall. Patient got up without her walker to close her front door. She had on panty hose and slipped and fell, landing on her left hip. Patient denies hitting her head or LOC. Patient denies neck and back pain  EXAM: CT HEAD WITHOUT CONTRAST  CT CERVICAL SPINE WITHOUT CONTRAST  TECHNIQUE: Multidetector CT imaging of the head and cervical spine was performed following the standard protocol without intravenous contrast. Multiplanar CT image reconstructions of the cervical spine were also generated.  COMPARISON:  None.  FINDINGS: CT HEAD FINDINGS  Ventricles are normal configuration. There is ventricular and sulcal enlargement reflecting age related volume loss. No parenchymal masses or mass effect. There are patchy areas of white matter hypoattenuation consistent with mild chronic microvascular ischemic change.  There is no evidence of a recent cortical infarct.  There are no extra-axial masses or abnormal fluid collections.  No intracranial hemorrhage.  Visualized sinuses are clear.  No skull fracture.  CT CERVICAL SPINE FINDINGS  No fracture. No spondylolisthesis. Mild loss of disc height with disc bulging is noted most evident at C3-C4. There are facet degenerative changes that are most evident on the left at C3-C4. Moderate neural foraminal narrowing is noted on the left at this level. Bones are diffusely demineralized. Soft tissues are unremarkable. Lung apices are clear.  IMPRESSION: HEAD CT:  No acute intracranial abnormalities.  No skull fracture.  CERVICAL CT:  No fracture or acute finding.   Electronically  Signed   By: Amie Portland M.D.   On: 04/04/2014 20:15   Ct Cervical Spine Wo Contrast  04/04/2014   CLINICAL DATA:  patient comes from [facility] independent [facility] with c/o left hip pain, shortening and external rotation post fall. Patient got up without her walker to close her front door. She had on panty hose and slipped and fell, landing on her left hip. Patient denies hitting her head or LOC. Patient denies neck and back pain  EXAM: CT HEAD WITHOUT CONTRAST  CT CERVICAL SPINE WITHOUT CONTRAST  TECHNIQUE: Multidetector CT imaging of the head and cervical spine was performed following the standard protocol without intravenous contrast. Multiplanar CT image reconstructions of the cervical spine were also generated.  COMPARISON:  None.  FINDINGS: CT HEAD FINDINGS  Ventricles are normal configuration. There is ventricular and sulcal enlargement reflecting age related volume loss. No parenchymal masses or mass effect. There are patchy areas of white matter hypoattenuation consistent with mild chronic microvascular ischemic change.  There is no evidence of a recent cortical infarct.  There are no extra-axial masses or abnormal fluid collections.  No intracranial hemorrhage.  Visualized sinuses are clear.  No skull fracture.  CT CERVICAL SPINE FINDINGS  No fracture. No spondylolisthesis. Mild loss of disc height with disc bulging is noted most evident at C3-C4. There are facet degenerative changes that are most evident on the left at C3-C4. Moderate neural foraminal narrowing is noted on the left at this level. Bones are diffusely demineralized. Soft tissues are unremarkable. Lung apices are clear.  IMPRESSION: HEAD CT:  No acute intracranial abnormalities.  No skull fracture.  CERVICAL CT:  No fracture or acute finding.   Electronically Signed   By: Amie Portland M.D.   On: 04/04/2014 20:15   Pelvis Portable  04/05/2014   CLINICAL DATA:  Hip replacement  EXAM: PORTABLE PELVIS 1-2 VIEWS  COMPARISON:  04/04/2014   FINDINGS: Left hip hemiarthroplasty is in place. Anatomic alignment. No breakage or loosening of the hardware. Osteopenia. Chronic deformity of the pubic bones bilaterally.  IMPRESSION: Left hip hemiarthroplasty anatomically aligned.   Electronically Signed   By: Maryclare Bean M.D.   On: 04/05/2014 18:00   Dg Chest Port 1 View  04/04/2014   CLINICAL DATA:  preop for hx of left hip fracture. Pt fell at her assisted living facility today. Pt takes HTN meds. Not diabetic. Nonsmoker. Pt has a pace maker that was placed several years ago. Pt gets SOB every now and again. No chest pains, coughing, congestion, nausea, vomiting, or  fevers  EXAM: PORTABLE CHEST - 1 VIEW  COMPARISON:  10/09/2012  FINDINGS: The cardiac silhouette normal in size. Moderate to large hiatal hernia projects over the cardiac silhouette. Mediastinum otherwise normal in contour. No hilar masses. Left anterior chest wall biventricular cardioverter-defibrillator is stable.  Clear lungs.  No pleural effusion or pneumothorax.  Left shoulder prosthesis, incompletely imaged, appears well aligned and well seated. Advanced arthropathic changes of the right shoulder are stable. Bones are diffusely demineralized.  IMPRESSION: No acute cardiopulmonary disease.   Electronically Signed   By: Amie Portland M.D.   On: 04/04/2014 19:41   Dg Hip Unilat With Pelvis Min 4 Views Left  04/04/2014   CLINICAL DATA:  Patient comes from [facility] independent [facility] with c/o left hip pain, shortening and external rotation post fall. Patient got up without her walker to close her front door. She had on panty hose and slipped and fell  EXAM: DG HIP W/ PWLVIS 4+V*L*  COMPARISON:  None.  FINDINGS: There is a fracture of the left femoral neck. Fracture is mid cervical with a separate fracture component of the subcapital femoral neck along the superior margin of the femoral head. Shaft component has migrated superiorly by approximately 1.5 cm. There is varus angulation and  approximately 30 degrees of apex anterior angulation.  Bones are extensively demineralized. There are old pubic rami fractures bilaterally. No other acute fractures. No dislocation.  IMPRESSION: Displaced fracture of the left femoral neck with varus and apex anterior angulation.   Electronically Signed   By: Amie Portland M.D.   On: 04/04/2014 19:32    PHYSICAL EXAM General: Well developed, elderly, in no acute distress. Head: Normal Neck: Negative for carotid bruits. JVD not elevated. No adenopathy Lungs: Clear bilaterally to auscultation without wheezes, rales, or rhonchi. Breathing is unlabored. Heart: RRR S1 S2 without murmurs, rubs, or gallops.  Abdomen: Soft, non-tender Extremities: No edema.  Distal pedal pulses are 2+ and equal bilaterally. Neuro: Alert and oriented X 3. Moves all extremities spontaneously. Psych:  Responds to questions appropriately with a normal affect.  ASSESSMENT AND PLAN: 1. S/p left hemiarthroplasty for hip fracture.  2. Chronic LBBB 3. Nonischemic cardiomyopathy EF 40%. Appears well compensated. 4. S/p pacemaker.  Patient did well with hip surgery. No complications this am. Cardiac status is stable. Continue prior medication. I will sign off at this point. Patient followed by hospitalist service. Please call with questions.  Present on Admission:  . Hip fracture  Signed, Marcellina Jonsson [place], MDFACC 04/06/2014 7:40 AM    '

## 2014-04-06 NOTE — Care Management Note (Addendum)
    Page 1 of 1   04/07/2014     1:17:29 PM CARE MANAGEMENT NOTE 04/07/2014  Patient:  [name], [name]   Account Number:  [id number]  Date Initiated:  04/05/2014  Documentation initiated by:  Lanier Clam  Subjective/Objective Assessment:   81 y/o f admitted w/l hip fx.     Action/Plan:   From ALF-[facility].   Anticipated DC Date:  04/07/2014   Anticipated DC Plan:  SKILLED NURSING FACILITY      DC Planning Services  CM consult      Choice offered to / List presented to:             Status of service:  Completed, signed off Medicare Important Message given?  YES (If response is "NO", the following Medicare IM given date fields will be blank) Date Medicare IM given:  04/07/2014 Medicare IM given by:  [facility] Date Additional Medicare IM given:   Additional Medicare IM given by:    Discharge Disposition:  SKILLED NURSING FACILITY  Per UR Regulation:  Reviewed for med. necessity/level of care/duration of stay  If discussed at Long Length of Stay Meetings, dates discussed:    Comments:  04/06/14 Lanier Clam RN BSN NCM [phone] d/c snf.  04/05/14 Lanier Clam RN BSN NCM [phone] Ortho-s/p L hip hemi.Await PT/OT eval.

## 2014-04-06 NOTE — Progress Notes (Signed)
Patient ID: [name], female   DOB: 1921-01-05, 81 y.o.   MRN: [id number] Subjective: 1 Day Post-Op Procedure(s) (LRB): ARTHROPLASTY BIPOLAR HIP (Left)    Patient reports pain as mild. Some confusion appreciated by family but pleasant.  No events overnight, slept well  Objective:   VITALS:   Filed Vitals:   04/06/14 0800  BP:   Pulse:   Temp:   Resp: 18    Neurovascular intact Incision: dressing C/D/I  LABS  Recent Labs  04/04/14 2125 04/05/14 0535 04/06/14 0540  HGB 12.7 13.0 11.0*  HCT 38.9 39.8 33.2*  WBC 13.6* 16.4* 12.3*  PLT 222 203 161     Recent Labs  04/04/14 1928 04/04/14 2125 04/05/14 0535 04/06/14 0540  NA 139  --  144 139  K 3.6  --  3.9 3.5  BUN 15  --  15 15  CREATININE 0.84 0.70 0.83 0.75  GLUCOSE 108*  --  124* 148*    No results for input(s): LABPT, INR in the last 72 hours.   Assessment/Plan: 1 Day Post-Op Procedure(s) (LRB): ARTHROPLASTY BIPOLAR HIP (Left)   Advance diet Up with therapy Discharge to SNF when medically stable  Due to confusion and the fact that she does seem to be tolerating her post-operative pain just fine I would eliminate narcotics at this point until her mentation improves. Tylenol (1000mg  PO Q8hr) or tramadol for pain  Will follow while in hospital

## 2014-04-06 NOTE — Evaluation (Signed)
Physical Therapy Evaluation Patient Details Name: [name] MRN: [id number] DOB: 1920/12/01 Today's Date: 04/06/2014   History of Present Illness  81 yo female s/p L hip hemi 04/05/13. Hx of dementia, HF, pacemaker, spinal stenosis, osteoporosis, PE, CAD, chronic LBBB. Pt is from [facility]  Clinical Impression  On eval, pt required Mod assist +2 for mobility-able to stand and take a few lateral steps alongside bed with RW. Some discomfort with WBing on L LE but pt tolerated fairly well. Impaired standing balance noted as well. Recommend continued rehab at SNF-pt plans to return to [facility].     Follow Up Recommendations SNF;Supervision/Assistance - 24 hour    Equipment Recommendations  None recommended by PT    Recommendations for Other Services OT consult     Precautions / Restrictions Precautions Precautions: Fall;Posterior Hip Precaution Comments: Verbalized and demonstrated hip precautions to pt/son. Pt will require constant reminders and cues-unable to remember. Son wrote precautions down-declined handout. Restrictions Weight Bearing Restrictions: No LLE Weight Bearing: Weight bearing as tolerated      Mobility  Bed Mobility Overal bed mobility: Needs Assistance Bed Mobility: Supine to Sit;Sit to Supine     Supine to sit: Mod assist;+2 for physical assistance;HOB elevated;+2 for safety/equipment Sit to supine: Mod assist;+2 for physical assistance;+2 for safety/equipment;HOB elevated   General bed mobility comments: Assist for trunk and bil LEs. Increased time. Utilized bedpad for scooting, positioning. Increased time. Multimodal cues and assistance for adherence to precautions.   Transfers Overall transfer level: Needs assistance Equipment used: Rolling walker (2 wheeled) Transfers: Sit to/from Stand Sit to Stand: Mod assist;+2 physical assistance;+2 safety/equipment;From elevated surface         General transfer comment: Assist to rise, stabilize,  control descent. Multimodal cues for safety, technique, hand/LE placement, adherence to precautions. Weight shifted posteriorly with standing  Ambulation/Gait             General Gait Details: 5 lateral steps with RW towards HOB. Assist to maneuver walker and stabilize/support pt. Pt tolerated fairly well. Some difficulty with WBing on L to unweight/step with R LE.   Stairs            Wheelchair Mobility    Modified Rankin (Stroke Patients Only)       Balance Overall balance assessment: Needs assistance;History of Falls Sitting-balance support: Bilateral upper extremity supported;Feet supported Sitting balance-Leahy Scale: Fair     Standing balance support: Bilateral upper extremity supported;During functional activity Standing balance-Leahy Scale: Poor                               Pertinent Vitals/Pain Pain Assessment: No/denies pain    Home Living Family/patient expects to be discharged to:: Skilled nursing facility               Home Equipment: Walker - 2 wheels      Prior Function Level of Independence: Independent with assistive device(s)               Hand Dominance        Extremity/Trunk Assessment   Upper Extremity Assessment: Defer to OT evaluation           Lower Extremity Assessment: LLE deficits/detail   LLE Deficits / Details: at least hip flex 2/5, hip abd/add 2/5, good quad set, moves ankle well.   Cervical / Trunk Assessment: Kyphotic  Communication   Communication: No difficulties  Cognition Arousal/Alertness: Awake/alert Behavior During Therapy:  WFL for tasks assessed/performed Overall Cognitive Status: History of cognitive impairments - at baseline       Memory: Decreased short-term memory;Decreased recall of precautions              General Comments      Exercises General Exercises - Lower Extremity Ankle Circles/Pumps: AROM;Both;10 reps;Supine Quad Sets: AROM;Both;10 reps;Supine Heel  Slides: AAROM;Left;10 reps;Supine Hip ABduction/ADduction: AAROM;Left;10 reps;Supine      Assessment/Plan    PT Assessment Patient needs continued PT services  PT Diagnosis Difficulty walking;Abnormality of gait;Generalized weakness;Acute pain;Altered mental status   PT Problem List Decreased strength;Decreased range of motion;Decreased activity tolerance;Decreased balance;Decreased mobility;Decreased knowledge of precautions;Decreased safety awareness;Decreased knowledge of use of DME;Decreased cognition;Pain  PT Treatment Interventions DME instruction;Gait training;Therapeutic activities;Therapeutic exercise;Functional mobility training;Patient/family education;Balance training   PT Goals (Current goals can be found in the Care Plan section) Acute Rehab PT Goals Patient Stated Goal: per son, back to [facility] for continued rehab PT Goal Formulation: With family Time For Goal Achievement: 04/20/14 Potential to Achieve Goals: Good    Frequency Min 3X/week   Barriers to discharge        Co-evaluation               End of Session Equipment Utilized During Treatment: Gait belt Activity Tolerance: Patient tolerated treatment well Patient left: in bed;with call bell/phone within reach;with bed alarm set;with family/visitor present           Time: 7159-5396 PT Time Calculation (min) (ACUTE ONLY): 28 min   Charges:   PT Evaluation $Initial PT Evaluation Tier I: 1 Procedure PT Treatments $Gait Training: 8-22 mins $Therapeutic Exercise: 8-22 mins   PT G Codes:        Rebeca Alert, MPT Pager: [phone]

## 2014-04-06 NOTE — Discharge Summary (Signed)
Physician Discharge Summary  [name] MRN: [id number] DOB/AGE: [birth year] 81 y.o.  PCP: Mathews Argyle, MD   Admit date: 04/04/2014 Discharge date: 04/06/2014  Discharge Diagnoses:  Left hip hemiarthroplasty    Hip fracture   Preop cardiovascular exam  Follow-up recommendations  follow-up with PCP in 5-7 days Follow-up CBC, CMP in 1 week     Medication List    TAKE these medications        acetaminophen 325 MG tablet  Commonly known as:  TYLENOL  Take 2 tablets (650 mg total) by mouth every 6 (six) hours as needed for mild pain (or Fever >/= 101).     albuterol 108 (90 BASE) MCG/ACT inhaler  Commonly known as:  PROVENTIL HFA;VENTOLIN HFA  Inhale 1-2 puffs into the lungs every 6 (six) hours as needed for wheezing.     alendronate 35 MG tablet  Commonly known as:  FOSAMAX  Take 35 mg by mouth every 7 (seven) days. Take with a full glass of water on an empty stomach.     aspirin EC 325 MG tablet  Take 1 tablet (325 mg total) by mouth 2 (two) times daily. Take for 4 weeks     carvedilol 6.25 MG tablet  Commonly known as:  COREG  Take 1 tablet by mouth Twice daily.     cyanocobalamin 1000 MCG tablet  Take 100 mcg by mouth daily.     DSS 100 MG Caps  Take 100 mg by mouth 2 (two) times daily.     DULoxetine 30 MG capsule  Commonly known as:  CYMBALTA  Take 30 mg by mouth daily.     febuxostat 40 MG tablet  Commonly known as:  ULORIC  Take 40 mg by mouth daily.     HYDROcodone-acetaminophen 5-325 MG per tablet  Commonly known as:  NORCO/VICODIN  Take 1-2 tablets by mouth every 6 (six) hours as needed for moderate pain.     lansoprazole 15 MG capsule  Commonly known as:  PREVACID  Take 15 mg by mouth every morning. For reflux.     nitroGLYCERIN 0.4 MG SL tablet  Commonly known as:  NITROSTAT  Place 0.4 mg under the tongue every 5 (five) minutes as needed for chest pain.     POLY-IRON 150 PO  Take 1 tablet by mouth daily.     tolterodine 4  MG 24 hr capsule  Commonly known as:  DETROL LA  Take 4 mg by mouth at bedtime and may repeat dose one time if needed.     torsemide 20 MG tablet  Commonly known as:  DEMADEX  Take 10 mg by mouth every other day.     traMADol-acetaminophen 37.5-325 MG per tablet  Commonly known as:  ULTRACET  Take 1 tablet by mouth every 6 (six) hours as needed.        Discharge Condition:    Disposition: SNF   Consults:  Cardiology Orthopedics   Significant Diagnostic Studies: Ct Head Wo Contrast  04/04/2014   CLINICAL DATA:  patient comes from [facility] with c/o left hip pain, shortening and external rotation post fall. Patient got up without her walker to close her front door. She had on panty hose and slipped and fell, landing on her left hip. Patient denies hitting her head or LOC. Patient denies neck and back pain  EXAM: CT HEAD WITHOUT CONTRAST  CT CERVICAL SPINE WITHOUT CONTRAST  TECHNIQUE: Multidetector CT imaging of the head and cervical spine  was performed following the standard protocol without intravenous contrast. Multiplanar CT image reconstructions of the cervical spine were also generated.  COMPARISON:  None.  FINDINGS: CT HEAD FINDINGS  Ventricles are normal configuration. There is ventricular and sulcal enlargement reflecting age related volume loss. No parenchymal masses or mass effect. There are patchy areas of white matter hypoattenuation consistent with mild chronic microvascular ischemic change.  There is no evidence of a recent cortical infarct.  There are no extra-axial masses or abnormal fluid collections.  No intracranial hemorrhage.  Visualized sinuses are clear.  No skull fracture.  CT CERVICAL SPINE FINDINGS  No fracture. No spondylolisthesis. Mild loss of disc height with disc bulging is noted most evident at C3-C4. There are facet degenerative changes that are most evident on the left at C3-C4. Moderate neural foraminal narrowing is noted on the left at  this level. Bones are diffusely demineralized. Soft tissues are unremarkable. Lung apices are clear.  IMPRESSION: HEAD CT:  No acute intracranial abnormalities.  No skull fracture.  CERVICAL CT:  No fracture or acute finding.   Electronically Signed   By: Lajean Manes M.D.   On: 04/04/2014 20:15   Ct Cervical Spine Wo Contrast  04/04/2014   CLINICAL DATA:  patient comes from [facility] with c/o left hip pain, shortening and external rotation post fall. Patient got up without her walker to close her front door. She had on panty hose and slipped and fell, landing on her left hip. Patient denies hitting her head or LOC. Patient denies neck and back pain  EXAM: CT HEAD WITHOUT CONTRAST  CT CERVICAL SPINE WITHOUT CONTRAST  TECHNIQUE: Multidetector CT imaging of the head and cervical spine was performed following the standard protocol without intravenous contrast. Multiplanar CT image reconstructions of the cervical spine were also generated.  COMPARISON:  None.  FINDINGS: CT HEAD FINDINGS  Ventricles are normal configuration. There is ventricular and sulcal enlargement reflecting age related volume loss. No parenchymal masses or mass effect. There are patchy areas of white matter hypoattenuation consistent with mild chronic microvascular ischemic change.  There is no evidence of a recent cortical infarct.  There are no extra-axial masses or abnormal fluid collections.  No intracranial hemorrhage.  Visualized sinuses are clear.  No skull fracture.  CT CERVICAL SPINE FINDINGS  No fracture. No spondylolisthesis. Mild loss of disc height with disc bulging is noted most evident at C3-C4. There are facet degenerative changes that are most evident on the left at C3-C4. Moderate neural foraminal narrowing is noted on the left at this level. Bones are diffusely demineralized. Soft tissues are unremarkable. Lung apices are clear.  IMPRESSION: HEAD CT:  No acute intracranial abnormalities.  No skull fracture.   CERVICAL CT:  No fracture or acute finding.   Electronically Signed   By: Lajean Manes M.D.   On: 04/04/2014 20:15   Pelvis Portable  04/05/2014   CLINICAL DATA:  Hip replacement  EXAM: PORTABLE PELVIS 1-2 VIEWS  COMPARISON:  04/04/2014  FINDINGS: Left hip hemiarthroplasty is in place. Anatomic alignment. No breakage or loosening of the hardware. Osteopenia. Chronic deformity of the pubic bones bilaterally.  IMPRESSION: Left hip hemiarthroplasty anatomically aligned.   Electronically Signed   By: Maryclare Bean M.D.   On: 04/05/2014 18:00   Dg Chest Port 1 View  04/04/2014   CLINICAL DATA:  preop for hx of left hip fracture. Pt fell at her assisted living facility today. Pt takes HTN meds. Not diabetic. Nonsmoker. Pt  has a Psychologist, forensic that was placed several years ago. Pt gets SOB every now and again. No chest pains, coughing, congestion, nausea, vomiting, or fevers  EXAM: PORTABLE CHEST - 1 VIEW  COMPARISON:  10/09/2012  FINDINGS: The cardiac silhouette normal in size. Moderate to large hiatal hernia projects over the cardiac silhouette. Mediastinum otherwise normal in contour. No hilar masses. Left anterior chest wall biventricular cardioverter-defibrillator is stable.  Clear lungs.  No pleural effusion or pneumothorax.  Left shoulder prosthesis, incompletely imaged, appears well aligned and well seated. Advanced arthropathic changes of the right shoulder are stable. Bones are diffusely demineralized.  IMPRESSION: No acute cardiopulmonary disease.   Electronically Signed   By: Lajean Manes M.D.   On: 04/04/2014 19:41   Dg Hip Unilat With Pelvis Min 4 Views Left  04/04/2014   CLINICAL DATA:  Patient comes from [facility] with c/o left hip pain, shortening and external rotation post fall. Patient got up without her walker to close her front door. She had on panty hose and slipped and fell  EXAM: DG HIP W/ PWLVIS 4+V*L*  COMPARISON:  None.  FINDINGS: There is a fracture of the left femoral  neck. Fracture is mid cervical with a separate fracture component of the subcapital femoral neck along the superior margin of the femoral head. Shaft component has migrated superiorly by approximately 1.5 cm. There is varus angulation and approximately 30 degrees of apex anterior angulation.  Bones are extensively demineralized. There are old pubic rami fractures bilaterally. No other acute fractures. No dislocation.  IMPRESSION: Displaced fracture of the left femoral neck with varus and apex anterior angulation.   Electronically Signed   By: Lajean Manes M.D.   On: 04/04/2014 19:32     Microbiology: Recent Results (from the past 240 hour(s))  Surgical pcr screen     Status: None   Collection Time: 04/05/14 10:45 AM  Result Value Ref Range Status   MRSA, PCR NEGATIVE NEGATIVE Final   Staphylococcus aureus NEGATIVE NEGATIVE Final    Comment:        The Xpert SA Assay (FDA approved for NASAL specimens in patients over 78 years of age), is one component of a comprehensive surveillance program.  Test performance has been validated by [facility] for patients greater than or equal to 53 year old. It is not intended to diagnose infection nor to guide or monitor treatment.      Labs: Results for orders placed or performed during the hospital encounter of 04/04/14 (from the past 48 hour(s))  CBC with Differential     Status: Abnormal   Collection Time: 04/04/14  7:28 PM  Result Value Ref Range   WBC 11.0 (H) 4.0 - 10.5 K/uL   RBC 4.26 3.87 - 5.11 MIL/uL   Hemoglobin 12.9 12.0 - 15.0 g/dL   HCT 39.4 36.0 - 46.0 %   MCV 92.5 78.0 - 100.0 fL   MCH 30.3 26.0 - 34.0 pg   MCHC 32.7 30.0 - 36.0 g/dL   RDW 13.2 11.5 - 15.5 %   Platelets 207 150 - 400 K/uL   Neutrophils Relative % 76 43 - 77 %   Neutro Abs 8.5 (H) 1.7 - 7.7 K/uL   Lymphocytes Relative 14 12 - 46 %   Lymphs Abs 1.5 0.7 - 4.0 K/uL   Monocytes Relative 7 3 - 12 %   Monocytes Absolute 0.8 0.1 - 1.0 K/uL   Eosinophils  Relative 2 0 - 5 %  Eosinophils Absolute 0.2 0.0 - 0.7 K/uL   Basophils Relative 1 0 - 1 %   Basophils Absolute 0.1 0.0 - 0.1 K/uL  Comprehensive metabolic panel     Status: Abnormal   Collection Time: 04/04/14  7:28 PM  Result Value Ref Range   Sodium 139 135 - 145 mmol/L    Comment: Please note change in reference range.   Potassium 3.6 3.5 - 5.1 mmol/L    Comment: Please note change in reference range.   Chloride 104 96 - 112 mEq/L   CO2 30 19 - 32 mmol/L   Glucose, Bld 108 (H) 70 - 99 mg/dL   BUN 15 6 - 23 mg/dL   Creatinine, Ser 0.84 0.50 - 1.10 mg/dL   Calcium 8.3 (L) 8.4 - 10.5 mg/dL   Total Protein 6.5 6.0 - 8.3 g/dL   Albumin 3.7 3.5 - 5.2 g/dL   AST 20 0 - 37 U/L   ALT 11 0 - 35 U/L   Alkaline Phosphatase 70 39 - 117 U/L   Total Bilirubin 0.7 0.3 - 1.2 mg/dL   GFR calc non Af Amer 59 (L) >90 mL/min   GFR calc Af Amer 68 (L) >90 mL/min    Comment: (NOTE) The eGFR has been calculated using the CKD EPI equation. This calculation has not been validated in all clinical situations. eGFR's persistently <90 mL/min signify possible Chronic Kidney Disease.    Anion gap 5 5 - 15  Troponin I     Status: None   Collection Time: 04/04/14  7:28 PM  Result Value Ref Range   Troponin I <0.03 <0.031 ng/mL    Comment:        NO INDICATION OF MYOCARDIAL INJURY. Please note change in reference range.   CBC     Status: Abnormal   Collection Time: 04/04/14  9:25 PM  Result Value Ref Range   WBC 13.6 (H) 4.0 - 10.5 K/uL   RBC 4.18 3.87 - 5.11 MIL/uL   Hemoglobin 12.7 12.0 - 15.0 g/dL   HCT 38.9 36.0 - 46.0 %   MCV 93.1 78.0 - 100.0 fL   MCH 30.4 26.0 - 34.0 pg   MCHC 32.6 30.0 - 36.0 g/dL   RDW 13.2 11.5 - 15.5 %   Platelets 222 150 - 400 K/uL  Creatinine, serum     Status: Abnormal   Collection Time: 04/04/14  9:25 PM  Result Value Ref Range   Creatinine, Ser 0.70 0.50 - 1.10 mg/dL   GFR calc non Af Amer 73 (L) >90 mL/min   GFR calc Af Amer 85 (L) >90 mL/min    Comment:  (NOTE) The eGFR has been calculated using the CKD EPI equation. This calculation has not been validated in all clinical situations. eGFR's persistently <90 mL/min signify possible Chronic Kidney Disease.   Urinalysis, Routine w reflex microscopic     Status: Abnormal   Collection Time: 04/04/14 10:28 PM  Result Value Ref Range   Color, Urine YELLOW YELLOW   APPearance CLOUDY (A) CLEAR   Specific Gravity, Urine 1.008 1.005 - 1.030   pH 5.5 5.0 - 8.0   Glucose, UA NEGATIVE NEGATIVE mg/dL   Hgb urine dipstick NEGATIVE NEGATIVE   Bilirubin Urine NEGATIVE NEGATIVE   Ketones, ur NEGATIVE NEGATIVE mg/dL   Protein, ur NEGATIVE NEGATIVE mg/dL   Urobilinogen, UA 0.2 0.0 - 1.0 mg/dL   Nitrite NEGATIVE NEGATIVE   Leukocytes, UA TRACE (A) NEGATIVE  Urine microscopic-add on  Status: Abnormal   Collection Time: 04/04/14 10:28 PM  Result Value Ref Range   Squamous Epithelial / LPF RARE RARE   WBC, UA 3-6 <3 WBC/hpf   RBC / HPF 0-2 <3 RBC/hpf   Bacteria, UA MANY (A) RARE   Casts HYALINE CASTS (A) NEGATIVE  Comprehensive metabolic panel     Status: Abnormal   Collection Time: 04/05/14  5:35 AM  Result Value Ref Range   Sodium 144 135 - 145 mmol/L    Comment: Please note change in reference range.   Potassium 3.9 3.5 - 5.1 mmol/L    Comment: Please note change in reference range.   Chloride 101 96 - 112 mEq/L   CO2 34 (H) 19 - 32 mmol/L   Glucose, Bld 124 (H) 70 - 99 mg/dL   BUN 15 6 - 23 mg/dL   Creatinine, Ser 0.83 0.50 - 1.10 mg/dL   Calcium 8.5 8.4 - 10.5 mg/dL   Total Protein 6.6 6.0 - 8.3 g/dL   Albumin 3.8 3.5 - 5.2 g/dL   AST 21 0 - 37 U/L   ALT 11 0 - 35 U/L   Alkaline Phosphatase 68 39 - 117 U/L   Total Bilirubin 1.1 0.3 - 1.2 mg/dL   GFR calc non Af Amer 60 (L) >90 mL/min   GFR calc Af Amer 69 (L) >90 mL/min    Comment: (NOTE) The eGFR has been calculated using the CKD EPI equation. This calculation has not been validated in all clinical situations. eGFR's persistently  <90 mL/min signify possible Chronic Kidney Disease.    Anion gap 9 5 - 15  CBC WITH DIFFERENTIAL     Status: Abnormal   Collection Time: 04/05/14  5:35 AM  Result Value Ref Range   WBC 16.4 (H) 4.0 - 10.5 K/uL   RBC 4.25 3.87 - 5.11 MIL/uL   Hemoglobin 13.0 12.0 - 15.0 g/dL   HCT 39.8 36.0 - 46.0 %   MCV 93.6 78.0 - 100.0 fL   MCH 30.6 26.0 - 34.0 pg   MCHC 32.7 30.0 - 36.0 g/dL   RDW 13.3 11.5 - 15.5 %   Platelets 203 150 - 400 K/uL   Neutrophils Relative % 87 (H) 43 - 77 %   Neutro Abs 14.4 (H) 1.7 - 7.7 K/uL   Lymphocytes Relative 6 (L) 12 - 46 %   Lymphs Abs 1.0 0.7 - 4.0 K/uL   Monocytes Relative 6 3 - 12 %   Monocytes Absolute 0.9 0.1 - 1.0 K/uL   Eosinophils Relative 1 0 - 5 %   Eosinophils Absolute 0.1 0.0 - 0.7 K/uL   Basophils Relative 0 0 - 1 %   Basophils Absolute 0.1 0.0 - 0.1 K/uL  Troponin I     Status: None   Collection Time: 04/05/14  9:53 AM  Result Value Ref Range   Troponin I 0.03 <0.031 ng/mL    Comment:        NO INDICATION OF MYOCARDIAL INJURY. Please note change in reference range.   Surgical pcr screen     Status: None   Collection Time: 04/05/14 10:45 AM  Result Value Ref Range   MRSA, PCR NEGATIVE NEGATIVE   Staphylococcus aureus NEGATIVE NEGATIVE    Comment:        The Xpert SA Assay (FDA approved for NASAL specimens in patients over 23 years of age), is one component of a comprehensive surveillance program.  Test performance has been validated by [facility] for patients  greater than or equal to 48 year old. It is not intended to diagnose infection nor to guide or monitor treatment.   Type and screen     Status: None   Collection Time: 04/05/14  2:19 PM  Result Value Ref Range   ABO/RH(D) A NEG    Antibody Screen NEG    Sample Expiration 04/08/2014   TSH     Status: None   Collection Time: 04/06/14  5:40 AM  Result Value Ref Range   TSH 1.266 0.350 - 4.500 uIU/mL    Comment: Performed at [facility]  CBC     Status:  Abnormal   Collection Time: 04/06/14  5:40 AM  Result Value Ref Range   WBC 12.3 (H) 4.0 - 10.5 K/uL   RBC 3.58 (L) 3.87 - 5.11 MIL/uL   Hemoglobin 11.0 (L) 12.0 - 15.0 g/dL   HCT 33.2 (L) 36.0 - 46.0 %   MCV 92.7 78.0 - 100.0 fL   MCH 30.7 26.0 - 34.0 pg   MCHC 33.1 30.0 - 36.0 g/dL   RDW 13.3 11.5 - 15.5 %   Platelets 161 150 - 400 K/uL  Comprehensive metabolic panel     Status: Abnormal   Collection Time: 04/06/14  5:40 AM  Result Value Ref Range   Sodium 139 135 - 145 mmol/L    Comment: Please note change in reference range.   Potassium 3.5 3.5 - 5.1 mmol/L    Comment: Please note change in reference range.   Chloride 103 96 - 112 mEq/L   CO2 30 19 - 32 mmol/L   Glucose, Bld 148 (H) 70 - 99 mg/dL   BUN 15 6 - 23 mg/dL   Creatinine, Ser 0.75 0.50 - 1.10 mg/dL   Calcium 7.8 (L) 8.4 - 10.5 mg/dL   Total Protein 5.5 (L) 6.0 - 8.3 g/dL   Albumin 2.9 (L) 3.5 - 5.2 g/dL   AST 20 0 - 37 U/L   ALT 10 0 - 35 U/L   Alkaline Phosphatase 56 39 - 117 U/L   Total Bilirubin 0.8 0.3 - 1.2 mg/dL   GFR calc non Af Amer 72 (L) >90 mL/min   GFR calc Af Amer 83 (L) >90 mL/min    Comment: (NOTE) The eGFR has been calculated using the CKD EPI equation. This calculation has not been validated in all clinical situations. eGFR's persistently <90 mL/min signify possible Chronic Kidney Disease.    Anion gap 6 5 - 15     HPI :Pleasant 81 yo leaving at [facility]. [address] and broke her left hip. Needs surgery this afternoon with Dr Alvan Dame. History of distant CAD stent in 2004. Seen by Dr Jaci Standard for a while. DCM with EF 24% by  MRI in 2007. Had AICD placed. Has not been in hospital for CAD or CHF. Sees Dr Lovena Le yearly for device check. 3/13 AICD removed and PPM placed due to advanced age and no AICD firing. 2012 had non ischemic myovue and echo with EF 40% . She denies chest pain, palpitations, dyspnea or syncope Compliant meds. Distant history of bilateral TKR;s with no complications.  Her son Joneen Boers is with her and actively involved with her care. Ambulating with walker prior to fall and reasonable QOL.    HOSPITAL COURSE:  Hip fracture Status post left hip hemiarthroplasty Seen by Dr. Alvan Dame Aspirin 325 by mouth twice a day 4 weeks for DVT prophylaxis Monitor CBC weekly Hemoglobin trended down from 12.9-11.0 prior to discharge  Status post pacemaker, chronic BPV, nonischemic cardiomyopathy EF of 40%, well compensated. Preop cardiac clearance obtained As per cardiology, Moderate risk given age and cardiac history but acceptable given alternative of being non ambulatory. Discussed with son and patient they are willing to proceed with surgery this afternoon Patient did well post surgery, no cardiac complications Continue Demadex   Leukocytosis Secondary to stress margination, UA, chest x-ray negative WBC count 11.3 prior to discharge   CAD: Distant history Non ischemic myovue 2012 no chest pain. Chronic BBB Troponin negative continue asa and beta blocker  CHF: History of with improved EF by echo 2012  last 2-D echo was in 2012 TSH 1.266  Pacer: Has had normal function with Dr Lovena Le      Discharge Exam:  Blood pressure 109/61, pulse 77, temperature 97.4 F (36.3 C), temperature source Oral, resp. rate 18, height _0  (1.575 m), weight 69.037 kg (152 lb 3.2 oz), SpO2 98 %.  General: Well developed, elderly, in no acute distress. Head: Normal Neck: Negative for carotid bruits. JVD not elevated. No adenopathy Lungs: Clear bilaterally to auscultation without wheezes, rales, or rhonchi. Breathing is unlabored. Heart: RRR S1 S2 without murmurs, rubs, or gallops.  Abdomen: Soft, non-tender Extremities: No edema. Distal pedal pulses are 2+ and equal bilaterally. Neuro: Alert and oriented X 3. Moves all extremities spontaneously. Psych: Responds to questions appropriately with a normal affect.       Discharge Instructions    Diet - low  sodium heart healthy    Complete by:  As directed      Diet - low sodium heart healthy    Complete by:  As directed      Increase activity slowly    Complete by:  As directed      Increase activity slowly    Complete by:  As directed      Weight bearing as tolerated    Complete by:  As directed            Follow-up Information    Follow up with Mauri Pole, MD In 2 weeks.   Specialty:  Orthopedic Surgery   Why:  For wound check   Contact information:   [address] [place] [address] [phone]       Signed: Reyne Dumas 04/06/2014, 11:01 AM

## 2014-04-06 NOTE — Progress Notes (Signed)
Patient has a low urine output with only 100 ml since last night. Bladdder scan performed which revealed only 95 ml. Coralie Carpen NP was made aware, ordered IVF and advised to leave foley catheter in place to monitor urine output. We will continue to monitor patient.

## 2014-04-07 LAB — BASIC METABOLIC PANEL
ANION GAP: 8 (ref 5–15)
BUN: 23 mg/dL (ref 6–23)
CO2: 31 mmol/L (ref 19–32)
CREATININE: 1.04 mg/dL (ref 0.50–1.10)
Calcium: 8.2 mg/dL — ABNORMAL LOW (ref 8.4–10.5)
Chloride: 101 mEq/L (ref 96–112)
GFR calc Af Amer: 53 mL/min — ABNORMAL LOW (ref >90)
GFR calc non Af Amer: 46 mL/min — ABNORMAL LOW (ref >90)
Glucose, Bld: 111 mg/dL — ABNORMAL HIGH (ref 70–99)
Potassium: 3.7 mmol/L (ref 3.5–5.1)
Sodium: 140 mmol/L (ref 135–145)

## 2014-04-07 LAB — CBC
HCT: 33.1 % — ABNORMAL LOW (ref 36.0–46.0)
Hemoglobin: 10.4 g/dL — ABNORMAL LOW (ref 12.0–15.0)
MCH: 29.9 pg (ref 26.0–34.0)
MCHC: 31.4 g/dL (ref 30.0–36.0)
MCV: 95.1 fL (ref 78.0–100.0)
PLATELETS: 186 10*3/uL (ref 150–400)
RBC: 3.48 MIL/uL — AB (ref 3.87–5.11)
RDW: 13.9 % (ref 11.5–15.5)
WBC: 11.4 10*3/uL — ABNORMAL HIGH (ref 4.0–10.5)

## 2014-04-07 NOTE — Discharge Summary (Signed)
Physician Discharge Summary  [name] MRN: [id number] DOB/AGE: 07/22/1920 81 y.o.  PCP: Mathews Argyle, MD   Admit date: 04/04/2014 Discharge date: 04/07/2014  Discharge Diagnoses:  Left hip hemiarthroplasty    Hip fracture   Preop cardiovascular exam  Follow-up recommendations  follow-up with PCP in 5-7 days Follow-up CBC, CMP in 1 week     Medication List    TAKE these medications        acetaminophen 325 MG tablet  Commonly known as:  TYLENOL  Take 2 tablets (650 mg total) by mouth every 6 (six) hours as needed for mild pain (or Fever >/= 101).     albuterol 108 (90 BASE) MCG/ACT inhaler  Commonly known as:  PROVENTIL HFA;VENTOLIN HFA  Inhale 1-2 puffs into the lungs every 6 (six) hours as needed for wheezing.     alendronate 35 MG tablet  Commonly known as:  FOSAMAX  Take 35 mg by mouth every 7 (seven) days. Take with a full glass of water on an empty stomach.     aspirin EC 325 MG tablet  Take 1 tablet (325 mg total) by mouth 2 (two) times daily. Take for 4 weeks     carvedilol 6.25 MG tablet  Commonly known as:  COREG  Take 1 tablet by mouth Twice daily.     cyanocobalamin 1000 MCG tablet  Take 100 mcg by mouth daily.     DSS 100 MG Caps  Take 100 mg by mouth 2 (two) times daily.     DULoxetine 30 MG capsule  Commonly known as:  CYMBALTA  Take 30 mg by mouth daily.     febuxostat 40 MG tablet  Commonly known as:  ULORIC  Take 40 mg by mouth daily.     HYDROcodone-acetaminophen 5-325 MG per tablet  Commonly known as:  NORCO/VICODIN  Take 1-2 tablets by mouth every 6 (six) hours as needed for moderate pain.     lansoprazole 15 MG capsule  Commonly known as:  PREVACID  Take 15 mg by mouth every morning. For reflux.     nitroGLYCERIN 0.4 MG SL tablet  Commonly known as:  NITROSTAT  Place 0.4 mg under the tongue every 5 (five) minutes as needed for chest pain.     POLY-IRON 150 PO  Take 1 tablet by mouth daily.     tolterodine 4  MG 24 hr capsule  Commonly known as:  DETROL LA  Take 4 mg by mouth at bedtime and may repeat dose one time if needed.     torsemide 20 MG tablet  Commonly known as:  DEMADEX  Take 10 mg by mouth every other day.     traMADol-acetaminophen 37.5-325 MG per tablet  Commonly known as:  ULTRACET  Take 1 tablet by mouth every 6 (six) hours as needed.        Discharge Condition:    Disposition: SNF   Consults:  Cardiology Orthopedics   Significant Diagnostic Studies: Ct Head Wo Contrast  04/04/2014   CLINICAL DATA:  patient comes from [place] independent living with c/o left hip pain, shortening and external rotation post fall. Patient got up without her walker to close her front door. She had on panty hose and slipped and fell, landing on her left hip. Patient denies hitting her head or LOC. Patient denies neck and back pain  EXAM: CT HEAD WITHOUT CONTRAST  CT CERVICAL SPINE WITHOUT CONTRAST  TECHNIQUE: Multidetector CT imaging of the head and cervical spine  was performed following the standard protocol without intravenous contrast. Multiplanar CT image reconstructions of the cervical spine were also generated.  COMPARISON:  None.  FINDINGS: CT HEAD FINDINGS  Ventricles are normal configuration. There is ventricular and sulcal enlargement reflecting age related volume loss. No parenchymal masses or mass effect. There are patchy areas of white matter hypoattenuation consistent with mild chronic microvascular ischemic change.  There is no evidence of a recent cortical infarct.  There are no extra-axial masses or abnormal fluid collections.  No intracranial hemorrhage.  Visualized sinuses are clear.  No skull fracture.  CT CERVICAL SPINE FINDINGS  No fracture. No spondylolisthesis. Mild loss of disc height with disc bulging is noted most evident at C3-C4. There are facet degenerative changes that are most evident on the left at C3-C4. Moderate neural foraminal narrowing is noted on the left at  this level. Bones are diffusely demineralized. Soft tissues are unremarkable. Lung apices are clear.  IMPRESSION: HEAD CT:  No acute intracranial abnormalities.  No skull fracture.  CERVICAL CT:  No fracture or acute finding.   Electronically Signed   By: Lajean Manes M.D.   On: 04/04/2014 20:15   Ct Cervical Spine Wo Contrast  04/04/2014   CLINICAL DATA:  patient comes from [place] independent living with c/o left hip pain, shortening and external rotation post fall. Patient got up without her walker to close her front door. She had on panty hose and slipped and fell, landing on her left hip. Patient denies hitting her head or LOC. Patient denies neck and back pain  EXAM: CT HEAD WITHOUT CONTRAST  CT CERVICAL SPINE WITHOUT CONTRAST  TECHNIQUE: Multidetector CT imaging of the head and cervical spine was performed following the standard protocol without intravenous contrast. Multiplanar CT image reconstructions of the cervical spine were also generated.  COMPARISON:  None.  FINDINGS: CT HEAD FINDINGS  Ventricles are normal configuration. There is ventricular and sulcal enlargement reflecting age related volume loss. No parenchymal masses or mass effect. There are patchy areas of white matter hypoattenuation consistent with mild chronic microvascular ischemic change.  There is no evidence of a recent cortical infarct.  There are no extra-axial masses or abnormal fluid collections.  No intracranial hemorrhage.  Visualized sinuses are clear.  No skull fracture.  CT CERVICAL SPINE FINDINGS  No fracture. No spondylolisthesis. Mild loss of disc height with disc bulging is noted most evident at C3-C4. There are facet degenerative changes that are most evident on the left at C3-C4. Moderate neural foraminal narrowing is noted on the left at this level. Bones are diffusely demineralized. Soft tissues are unremarkable. Lung apices are clear.  IMPRESSION: HEAD CT:  No acute intracranial abnormalities.  No skull fracture.   CERVICAL CT:  No fracture or acute finding.   Electronically Signed   By: Lajean Manes M.D.   On: 04/04/2014 20:15   Pelvis Portable  04/05/2014   CLINICAL DATA:  Hip replacement  EXAM: PORTABLE PELVIS 1-2 VIEWS  COMPARISON:  04/04/2014  FINDINGS: Left hip hemiarthroplasty is in place. Anatomic alignment. No breakage or loosening of the hardware. Osteopenia. Chronic deformity of the pubic bones bilaterally.  IMPRESSION: Left hip hemiarthroplasty anatomically aligned.   Electronically Signed   By: Maryclare Bean M.D.   On: 04/05/2014 18:00   Dg Chest Port 1 View  04/04/2014   CLINICAL DATA:  preop for hx of left hip fracture. Pt fell at her assisted living facility today. Pt takes HTN meds. Not diabetic. Nonsmoker. Pt  has a Psychologist, forensic that was placed several years ago. Pt gets SOB every now and again. No chest pains, coughing, congestion, nausea, vomiting, or fevers  EXAM: PORTABLE CHEST - 1 VIEW  COMPARISON:  10/09/2012  FINDINGS: The cardiac silhouette normal in size. Moderate to large hiatal hernia projects over the cardiac silhouette. Mediastinum otherwise normal in contour. No hilar masses. Left anterior chest wall biventricular cardioverter-defibrillator is stable.  Clear lungs.  No pleural effusion or pneumothorax.  Left shoulder prosthesis, incompletely imaged, appears well aligned and well seated. Advanced arthropathic changes of the right shoulder are stable. Bones are diffusely demineralized.  IMPRESSION: No acute cardiopulmonary disease.   Electronically Signed   By: Lajean Manes M.D.   On: 04/04/2014 19:41   Dg Hip Unilat With Pelvis Min 4 Views Left  04/04/2014   CLINICAL DATA:  Patient comes from [place] independent living with c/o left hip pain, shortening and external rotation post fall. Patient got up without her walker to close her front door. She had on panty hose and slipped and fell  EXAM: DG HIP W/ PWLVIS 4+V*L*  COMPARISON:  None.  FINDINGS: There is a fracture of the left femoral  neck. Fracture is mid cervical with a separate fracture component of the subcapital femoral neck along the superior margin of the femoral head. Shaft component has migrated superiorly by approximately 1.5 cm. There is varus angulation and approximately 30 degrees of apex anterior angulation.  Bones are extensively demineralized. There are old pubic rami fractures bilaterally. No other acute fractures. No dislocation.  IMPRESSION: Displaced fracture of the left femoral neck with varus and apex anterior angulation.   Electronically Signed   By: Lajean Manes M.D.   On: 04/04/2014 19:32     Microbiology: Recent Results (from the past 240 hour(s))  Surgical pcr screen     Status: None   Collection Time: 04/05/14 10:45 AM  Result Value Ref Range Status   MRSA, PCR NEGATIVE NEGATIVE Final   Staphylococcus aureus NEGATIVE NEGATIVE Final    Comment:        The Xpert SA Assay (FDA approved for NASAL specimens in patients over 36 years of age), is one component of a comprehensive surveillance program.  Test performance has been validated by [facility] for patients greater than or equal to 47 year old. It is not intended to diagnose infection nor to guide or monitor treatment.      Labs: Results for orders placed or performed during the hospital encounter of 04/04/14 (from the past 48 hour(s))  Troponin I     Status: None   Collection Time: 04/05/14  9:53 AM  Result Value Ref Range   Troponin I 0.03 <0.031 ng/mL    Comment:        NO INDICATION OF MYOCARDIAL INJURY. Please note change in reference range.   Surgical pcr screen     Status: None   Collection Time: 04/05/14 10:45 AM  Result Value Ref Range   MRSA, PCR NEGATIVE NEGATIVE   Staphylococcus aureus NEGATIVE NEGATIVE    Comment:        The Xpert SA Assay (FDA approved for NASAL specimens in patients over 46 years of age), is one component of a comprehensive surveillance program.  Test performance has been validated by  [facility] for patients greater than or equal to 49 year old. It is not intended to diagnose infection nor to guide or monitor treatment.   Type and screen     Status:  None   Collection Time: 04/05/14  2:19 PM  Result Value Ref Range   ABO/RH(D) A NEG    Antibody Screen NEG    Sample Expiration 04/08/2014   TSH     Status: None   Collection Time: 04/06/14  5:40 AM  Result Value Ref Range   TSH 1.266 0.350 - 4.500 uIU/mL    Comment: Performed at [facility]  CBC     Status: Abnormal   Collection Time: 04/06/14  5:40 AM  Result Value Ref Range   WBC 12.3 (H) 4.0 - 10.5 K/uL   RBC 3.58 (L) 3.87 - 5.11 MIL/uL   Hemoglobin 11.0 (L) 12.0 - 15.0 g/dL   HCT 33.2 (L) 36.0 - 46.0 %   MCV 92.7 78.0 - 100.0 fL   MCH 30.7 26.0 - 34.0 pg   MCHC 33.1 30.0 - 36.0 g/dL   RDW 13.3 11.5 - 15.5 %   Platelets 161 150 - 400 K/uL  Comprehensive metabolic panel     Status: Abnormal   Collection Time: 04/06/14  5:40 AM  Result Value Ref Range   Sodium 139 135 - 145 mmol/L    Comment: Please note change in reference range.   Potassium 3.5 3.5 - 5.1 mmol/L    Comment: Please note change in reference range.   Chloride 103 96 - 112 mEq/L   CO2 30 19 - 32 mmol/L   Glucose, Bld 148 (H) 70 - 99 mg/dL   BUN 15 6 - 23 mg/dL   Creatinine, Ser 0.75 0.50 - 1.10 mg/dL   Calcium 7.8 (L) 8.4 - 10.5 mg/dL   Total Protein 5.5 (L) 6.0 - 8.3 g/dL   Albumin 2.9 (L) 3.5 - 5.2 g/dL   AST 20 0 - 37 U/L   ALT 10 0 - 35 U/L   Alkaline Phosphatase 56 39 - 117 U/L   Total Bilirubin 0.8 0.3 - 1.2 mg/dL   GFR calc non Af Amer 72 (L) >90 mL/min   GFR calc Af Amer 83 (L) >90 mL/min    Comment: (NOTE) The eGFR has been calculated using the CKD EPI equation. This calculation has not been validated in all clinical situations. eGFR's persistently <90 mL/min signify possible Chronic Kidney Disease.    Anion gap 6 5 - 15  CBC     Status: Abnormal   Collection Time: 04/07/14  6:10 AM  Result Value Ref Range    WBC 11.4 (H) 4.0 - 10.5 K/uL    Comment: WHITE COUNT CONFIRMED ON SMEAR   RBC 3.48 (L) 3.87 - 5.11 MIL/uL   Hemoglobin 10.4 (L) 12.0 - 15.0 g/dL   HCT 33.1 (L) 36.0 - 46.0 %   MCV 95.1 78.0 - 100.0 fL   MCH 29.9 26.0 - 34.0 pg   MCHC 31.4 30.0 - 36.0 g/dL   RDW 13.9 11.5 - 15.5 %   Platelets 186 150 - 400 K/uL  Basic metabolic panel     Status: Abnormal   Collection Time: 04/07/14  6:10 AM  Result Value Ref Range   Sodium 140 135 - 145 mmol/L    Comment: Please note change in reference range.   Potassium 3.7 3.5 - 5.1 mmol/L    Comment: Please note change in reference range.   Chloride 101 96 - 112 mEq/L   CO2 31 19 - 32 mmol/L   Glucose, Bld 111 (H) 70 - 99 mg/dL   BUN 23 6 - 23 mg/dL   Creatinine, Ser 1.04  0.50 - 1.10 mg/dL   Calcium 8.2 (L) 8.4 - 10.5 mg/dL   GFR calc non Af Amer 46 (L) >90 mL/min   GFR calc Af Amer 53 (L) >90 mL/min    Comment: (NOTE) The eGFR has been calculated using the CKD EPI equation. This calculation has not been validated in all clinical situations. eGFR's persistently <90 mL/min signify possible Chronic Kidney Disease.    Anion gap 8 5 - 15     HPI :[name] 81 yo leaving at [facility]. [place] [address] and broke her left hip. Needs surgery this afternoon with Dr Alvan Dame. History of distant CAD stent in 2004. Seen by Dr Jaci Standard for a while. DCM with EF 24% by  MRI in 2007. Had AICD placed. Has not been in hospital for CAD or CHF. Sees Dr Lovena Le yearly for device check. 3/13 AICD removed and PPM placed due to advanced age and no AICD firing. 2012 had non ischemic myovue and echo with EF 40% . She denies chest pain, palpitations, dyspnea or syncope Compliant meds. Distant history of bilateral TKR;s with no complications. Her son Joneen Boers is with her and actively involved with her care. Ambulating with walker prior to fall and reasonable QOL.    HOSPITAL COURSE:  Hip fracture Status post left hip hemiarthroplasty Seen by Dr. Alvan Dame Aspirin 325 by  mouth twice a day 4 weeks for DVT prophylaxis Monitor CBC weekly Hemoglobin trended down from 12.9-11.0 prior to discharge   Status post pacemaker, chronic BPV, nonischemic cardiomyopathy EF of 40%, well compensated. Preop cardiac clearance obtained As per cardiology, Moderate risk given age and cardiac history but acceptable given alternative of being non ambulatory. Discussed with son and patient they are willing to proceed with surgery this afternoon Patient did well post surgery, no cardiac complications Continue Demadex   Leukocytosis Secondary to stress margination, UA, chest x-ray negative WBC count 11.3 prior to discharge   CAD: Distant history Non ischemic myovue 2012 no chest pain. Chronic BBB Troponin negative continue asa and beta blocker  CHF: History of with improved EF by echo 2012  last 2-D echo was in 2012 TSH 1.266  Pacer: Has had normal function with Dr Lovena Le      Discharge Exam:  Blood pressure 99/60, pulse 76, temperature 98.4 F (36.9 C), temperature source Oral, resp. rate 17, height _0  (1.575 m), weight 69.037 kg (152 lb 3.2 oz), SpO2 94 %.  General: Well developed, elderly, in no acute distress. Head: Normal Neck: Negative for carotid bruits. JVD not elevated. No adenopathy Lungs: Clear bilaterally to auscultation without wheezes, rales, or rhonchi. Breathing is unlabored. Heart: RRR S1 S2 without murmurs, rubs, or gallops.  Abdomen: Soft, non-tender Extremities: No edema. Distal pedal pulses are 2+ and equal bilaterally. Neuro: Alert and oriented X 3. Moves all extremities spontaneously. Psych: Responds to questions appropriately with a normal affect.   Discharge Instructions    Diet - low sodium heart healthy    Complete by:  As directed      Diet - low sodium heart healthy    Complete by:  As directed      Increase activity slowly    Complete by:  As directed      Increase activity slowly    Complete by:  As directed       Weight bearing as tolerated    Complete by:  As directed            Follow-up Information    Follow up with [facility]  D, MD In 2 weeks.   Specialty:  Orthopedic Surgery   Why:  For wound check   Contact information:   [address] [place] [address] [phone]       Signed: Reyne Dumas 04/07/2014, 9:17 AM

## 2014-04-07 NOTE — Progress Notes (Signed)
Patient is set to discharge to [facility] SNF today. Patient & son, Jake Shark aware. Discharge packet given to RN, Delorise Shiner. [facility] will be called for transport once SNF gives the ok.     Lincoln Maxin, LCSW [facility] Clinical Social Worker cell #: [phone]

## 2014-04-12 ENCOUNTER — Ambulatory Visit (INDEPENDENT_AMBULATORY_CARE_PROVIDER_SITE_OTHER): Payer: Medicare Other | Admitting: *Deleted

## 2014-04-12 DIAGNOSIS — I428 Other cardiomyopathies: Secondary | ICD-10-CM

## 2014-04-12 DIAGNOSIS — I429 Cardiomyopathy, unspecified: Secondary | ICD-10-CM

## 2014-04-12 NOTE — Progress Notes (Signed)
Remote pacemaker transmission.   

## 2014-04-13 LAB — BASIC METABOLIC PANEL
BUN: 11 mg/dL (ref 4–21)
CREATININE: 0.8 mg/dL (ref 0.5–1.1)
GLUCOSE: 92 mg/dL
Potassium: 3.6 mmol/L (ref 3.4–5.3)
SODIUM: 143 mmol/L (ref 137–147)

## 2014-04-13 LAB — CBC AND DIFFERENTIAL
HCT: 32 % — AB (ref 36–46)
Hemoglobin: 10.6 g/dL — AB (ref 12.0–16.0)
Platelets: 290 10*3/uL (ref 150–399)
WBC: 8 10^3/mL

## 2014-04-18 LAB — MDC_IDC_ENUM_SESS_TYPE_REMOTE
Battery Remaining Longevity: 110 mo
Battery Voltage: 2.78 V
Brady Statistic AP VP Percent: 21 %
Brady Statistic AS VP Percent: 12 %
Date Time Interrogation Session: 20160125160835
Lead Channel Impedance Value: 779 Ohm
Lead Channel Pacing Threshold Amplitude: 0.75 V
Lead Channel Pacing Threshold Pulse Width: 0.4 ms
Lead Channel Pacing Threshold Pulse Width: 0.4 ms
Lead Channel Sensing Intrinsic Amplitude: 2.8 mV
Lead Channel Sensing Intrinsic Amplitude: 22.4 mV
Lead Channel Setting Pacing Amplitude: 2 V
Lead Channel Setting Pacing Pulse Width: 0.4 ms
MDC IDC MSMT BATTERY IMPEDANCE: 227 Ohm
MDC IDC MSMT LEADCHNL RA IMPEDANCE VALUE: 413 Ohm
MDC IDC MSMT LEADCHNL RA PACING THRESHOLD AMPLITUDE: 0.75 V
MDC IDC SET LEADCHNL RV PACING AMPLITUDE: 2.5 V
MDC IDC SET LEADCHNL RV SENSING SENSITIVITY: 5.6 mV
MDC IDC STAT BRADY AP VS PERCENT: 3 %
MDC IDC STAT BRADY AS VS PERCENT: 64 %

## 2014-04-19 ENCOUNTER — Non-Acute Institutional Stay (SKILLED_NURSING_FACILITY): Payer: Medicare Other | Admitting: Adult Health

## 2014-04-19 ENCOUNTER — Encounter: Payer: Self-pay | Admitting: Adult Health

## 2014-04-19 DIAGNOSIS — D649 Anemia, unspecified: Secondary | ICD-10-CM

## 2014-04-19 DIAGNOSIS — Z950 Presence of cardiac pacemaker: Secondary | ICD-10-CM

## 2014-04-19 DIAGNOSIS — K5901 Slow transit constipation: Secondary | ICD-10-CM

## 2014-04-19 DIAGNOSIS — S72002A Fracture of unspecified part of neck of left femur, initial encounter for closed fracture: Secondary | ICD-10-CM

## 2014-04-19 DIAGNOSIS — K5900 Constipation, unspecified: Secondary | ICD-10-CM | POA: Insufficient documentation

## 2014-04-19 DIAGNOSIS — I5022 Chronic systolic (congestive) heart failure: Secondary | ICD-10-CM

## 2014-04-19 DIAGNOSIS — D638 Anemia in other chronic diseases classified elsewhere: Secondary | ICD-10-CM | POA: Insufficient documentation

## 2014-04-19 NOTE — Progress Notes (Signed)
Patient ID: [name], female   DOB: 1920-04-15, 81 y.o.   MRN: [id number]    Nursing Home Location:  [facility] [employer] of Service: SNF (31)  PCP: Ginette Otto, MD  Allergies  Allergen Reactions  . Arthrotec [Diclofenac-Misoprostol]   . Ibuprofen   . Lactose Intolerance (Gi)   . Milk-Related Compounds Nausea And Vomiting  . Other     Grass and ragweed  . Oxycontin [Oxycodone Hcl]   . Pollen Extract     Chief Complaint  Patient presents with  . Discharge Note    HPI:  Patient is a 81 y.o. female seen today at [facility] in the rehab section to evaluate for discharge back to AL. She was in the hospital 04/04/14-04/07/14 after a mechanical fall and subsequent left hemiarthroplasty by Dr. Charlann Boxer. The staff reports that she has baseline confusion but this worsened during her stay. She was using Ultracet for pain and they found that when the stopped it her confusion improved. A urine was performed but returned normal. Her labs and VS have been WNL. She has not had a BM in several days but is eating well and denies abd pain. She is working well with therapy and ambulatory with a walker.   Review of Systems:  Review of Systems  Constitutional: Negative for fever, chills, activity change and appetite change.  HENT: Negative for congestion, sore throat and trouble swallowing.   Respiratory: Negative for cough, shortness of breath and wheezing.   Cardiovascular: Positive for leg swelling. Negative for chest pain and palpitations.  Gastrointestinal: Positive for constipation. Negative for abdominal pain, blood in stool and abdominal distention.  Genitourinary: Negative for dysuria and difficulty urinating.  Musculoskeletal: Positive for joint swelling, arthralgias and gait problem. Negative for back pain.  Skin: Negative for color change, pallor and rash.  Neurological: Negative for dizziness, tremors, seizures, facial asymmetry and  numbness.  Psychiatric/Behavioral: Positive for confusion. Negative for behavioral problems and agitation.    Past Medical History  Diagnosis Date  . Dyslipidemia   . Diverticulitis   . HTN (hypertension)   . Osteoporosis   . Spinal stenosis   . Chronic low back pain   . CAD (coronary artery disease)   . Pulmonary embolism   . Nonischemic cardiomyopathy   . Ulcer disease   . H/O: hysterectomy   . Right knee pain 05/15/12  . Contusion of foot, right 05/15/12  . Weight loss   . Memory disturbance 05/2012    MMSE 26/30, intact Clock test   Past Surgical History  Procedure Laterality Date  . Right shoulder repair  1985    Dr. [name]  . Left shoulder replacement  1991    Dr. [name]  . Appendectomy  2010  . Cataract extraction    . Colon resection  2005  . Back surgery    . Rotator cuff debridement  1999    Dr. [name]  . Coronary angioplasty with stent placement  01/2003  . Hemicolectomy    . Biv icd genertaor change out N/A 05/24/2011    Procedure: BIV ICD GENERTAOR CHANGE OUT;  Surgeon: [name], MD;  Location: [facility] CATH LAB;  Service: Cardiovascular;  Laterality: N/A;  . Hip arthroplasty Left 04/05/2014    Procedure: ARTHROPLASTY BIPOLAR HIP;  Surgeon: Shelda Pal, MD;  Location: [facility] ORS;  Service: Orthopedics;  Laterality: Left;   Social History:   reports that she has never smoked. She does not have any smokeless  tobacco history on file. She reports that she does not drink alcohol or use illicit drugs.  Family History  Problem Relation Age of Onset  . Coronary artery disease Sister   . Heart attack Father 17  . Heart attack Mother   . Other Mother     abdominal blockage    Medications: Patient's Medications  New Prescriptions   No medications on file  Previous Medications   ACETAMINOPHEN (TYLENOL) 325 MG TABLET    Take 2 tablets (650 mg total) by mouth every 6 (six) hours as needed for mild pain (or Fever >/= 101).   ALBUTEROL (PROVENTIL HFA;VENTOLIN HFA) 108 (90  BASE) MCG/ACT INHALER    Inhale 1-2 puffs into the lungs every 6 (six) hours as needed for wheezing.   ALENDRONATE (FOSAMAX) 35 MG TABLET    Take 35 mg by mouth every 7 (seven) days. Take with a full glass of water on an empty stomach.   ASPIRIN EC 325 MG TABLET    Take 1 tablet (325 mg total) by mouth 2 (two) times daily. Take for 4 weeks   CARVEDILOL (COREG) 6.25 MG TABLET    Take 1 tablet by mouth Twice daily.   CYANOCOBALAMIN 1000 MCG TABLET    Take 100 mcg by mouth daily.   DOCUSATE SODIUM 100 MG CAPS    Take 100 mg by mouth 2 (two) times daily.   DULOXETINE (CYMBALTA) 30 MG CAPSULE    Take 30 mg by mouth daily.     FEBUXOSTAT (ULORIC) 40 MG TABLET    Take 40 mg by mouth daily.   HYDROCODONE-ACETAMINOPHEN (NORCO/VICODIN) 5-325 MG PER TABLET    Take 1-2 tablets by mouth every 6 (six) hours as needed for moderate pain.   LANSOPRAZOLE (PREVACID) 15 MG CAPSULE    Take 15 mg by mouth every morning. For reflux.   NITROGLYCERIN (NITROSTAT) 0.4 MG SL TABLET    Place 0.4 mg under the tongue every 5 (five) minutes as needed for chest pain.   POLYSACCHARIDE IRON COMPLEX (POLY-IRON 150 PO)    Take 1 tablet by mouth daily.    TOLTERODINE (DETROL LA) 4 MG 24 HR CAPSULE    Take 4 mg by mouth at bedtime and may repeat dose one time if needed.   TORSEMIDE (DEMADEX) 20 MG TABLET    Take 10 mg by mouth every other day.   Modified Medications   No medications on file  Discontinued Medications   TRAMADOL-ACETAMINOPHEN (ULTRACET) 37.5-325 MG PER TABLET    Take 1 tablet by mouth every 6 (six) hours as needed.     Physical Exam: Filed Vitals:   04/19/14 1056  BP: 104/69  Pulse: 88  Temp: 98 F (36.7 C)  Resp: 21  SpO2: 94%    Physical Exam  Constitutional: No distress.  frail  HENT:  Head: Normocephalic and atraumatic.  Mouth/Throat: No oropharyngeal exudate.  Eyes: Conjunctivae and EOM are normal. Pupils are equal, round, and reactive to light. Right eye exhibits no discharge. Left eye exhibits  no discharge.  Neck: Normal range of motion. Neck supple. No JVD present. No tracheal deviation present. No thyromegaly present.  Cardiovascular: Normal rate and regular rhythm.   No murmur heard. Pulmonary/Chest: Effort normal and breath sounds normal. No respiratory distress. She has no wheezes.  Abdominal: Soft. Bowel sounds are normal. She exhibits no distension. There is no tenderness.  Musculoskeletal:  Strength 4/5 to BUE, 3/5 to LLE, trace edema to LLE nor bruising or redness. Kyphois also  noted.  Lymphadenopathy:    She has no cervical adenopathy.  Neurological: She is alert. No cranial nerve deficit. Coordination normal.  Oriented to self, situation, and place but not to time. Repeats herself. Able to f/c and pleasant.   Skin: Skin is warm and dry. No rash noted. She is not diaphoretic. No erythema.  Wound covered with dressing. WNL. NO skin tenting.  Psychiatric: She has a normal mood and affect.    Labs reviewed: Basic Metabolic Panel:  Recent Labs  16/10/96 0535 04/06/14 0540 04/07/14 0610 04/13/14  NA 144 139 140 143  K 3.9 3.5 3.7 3.6  CL 101 103 101  --   CO2 34* 30 31  --   GLUCOSE 124* 148* 111*  --   BUN 15 15 23 11   CREATININE 0.83 0.75 1.04 0.8  CALCIUM 8.5 7.8* 8.2*  --    Liver Function Tests:  Recent Labs  04/04/14 1928 04/05/14 0535 04/06/14 0540  AST 20 21 20   ALT 11 11 10   ALKPHOS 70 68 56  BILITOT 0.7 1.1 0.8  PROT 6.5 6.6 5.5*  ALBUMIN 3.7 3.8 2.9*   No results for input(s): LIPASE, AMYLASE in the last 8760 hours. No results for input(s): AMMONIA in the last 8760 hours. CBC:  Recent Labs  04/04/14 1928  04/05/14 0535 04/06/14 0540 04/07/14 0610 04/13/14  WBC 11.0*  < > 16.4* 12.3* 11.4* 8.0  NEUTROABS 8.5*  --  14.4*  --   --   --   HGB 12.9  < > 13.0 11.0* 10.4* 10.6*  HCT 39.4  < > 39.8 33.2* 33.1* 32*  MCV 92.5  < > 93.6 92.7 95.1  --   PLT 207  < > 203 161 186 290  < > = values in this interval not  displayed. TSH:  Recent Labs  04/06/14 0540  TSH 1.266   A1C: No results found for: HGBA1C Lipid Panel: No results for input(s): CHOL, HDL, LDLCALC, TRIG, CHOLHDL, LDLDIRECT in the last 8760 hours.  Radiological Exams reviewed: EXAM: PORTABLE PELVIS 1-2 VIEWS   COMPARISON:  04/04/2014   FINDINGS: Left hip hemiarthroplasty is in place. Anatomic alignment. No breakage or loosening of the hardware. Osteopenia. Chronic deformity of the pubic bones bilaterally.   IMPRESSION: Left hip hemiarthroplasty anatomically aligned. EXAM: DG HIP W/ PWLVIS 4+V*L*   COMPARISON:  None.   FINDINGS: There is a fracture of the left femoral neck. Fracture is mid cervical with a separate fracture component of the subcapital femoral neck along the superior margin of the femoral head. Shaft component has migrated superiorly by approximately 1.5 cm. There is varus angulation and approximately 30 degrees of apex anterior angulation.   Bones are extensively demineralized. There are old pubic rami fractures bilaterally. No other acute fractures. No dislocation.   IMPRESSION: Displaced fracture of the left femoral neck with varus and apex anterior angulation. EXAM: CT HEAD WITHOUT CONTRAST   CT CERVICAL SPINE WITHOUT CONTRAST   TECHNIQUE: Multidetector CT imaging of the head and cervical spine was performed following the standard protocol without intravenous contrast. Multiplanar CT image reconstructions of the cervical spine were also generated.   COMPARISON:  None.   FINDINGS: CT HEAD FINDINGS   Ventricles are normal configuration. There is ventricular and sulcal enlargement reflecting age related volume loss. No parenchymal masses or mass effect. There are patchy areas of white matter hypoattenuation consistent with mild chronic microvascular ischemic change.   There is no evidence of a recent cortical infarct.  There are no extra-axial masses or abnormal fluid  collections.   No intracranial hemorrhage.   Visualized sinuses are clear.  No skull fracture.   CT CERVICAL SPINE FINDINGS   No fracture. No spondylolisthesis. Mild loss of disc height with disc bulging is noted most evident at C3-C4. There are facet degenerative changes that are most evident on the left at C3-C4. Moderate neural foraminal narrowing is noted on the left at this level. Bones are diffusely demineralized. Soft tissues are unremarkable. Lung apices are clear.   IMPRESSION: HEAD CT:  No acute intracranial abnormalities.  No skull fracture.   CERVICAL CT:  No fracture or acute finding.   Assessment/Plan  1.Hip fx s/p hemiarthroplasty Continue PT WBAT and aspirin. Not on coumadin due to fall risk. Monitor for signs of DVT. May discharge to AL with caregivers and f/u with Dr. Charlann Boxer and Dr. Pete Glatter.  May use Tylenol for pain, d/c ultracet due to confusion.  2. Chronic systolic HF Currently on ASA and torsemide and Coreg. Continue followed by Cardiology, no signs of acute failure.   3. Pacemaker Recently checked, no change. AICD in place.  4. Anemia, unspecified anemia type Currently on Iron and B12. Last Hgb on 04/13/14 was 10.6 with a normal MCV.  Will need f/u CBC  5. Slow transit constipation Continue Colace and give a MOM today.   6. Osteoporosis  Continue Fosamax, consider Ca with Vit D supplement, f/u with Dr. Pete Glatter  7. H/O PUD Continue Prevacid, no symptoms  [name], ANP [facility] [phone]

## 2014-04-22 ENCOUNTER — Encounter: Payer: Self-pay | Admitting: Cardiology

## 2014-04-27 ENCOUNTER — Encounter: Payer: Self-pay | Admitting: Internal Medicine

## 2014-04-27 ENCOUNTER — Non-Acute Institutional Stay (SKILLED_NURSING_FACILITY): Payer: Medicare Other | Admitting: Internal Medicine

## 2014-04-27 DIAGNOSIS — F0390 Unspecified dementia without behavioral disturbance: Secondary | ICD-10-CM

## 2014-04-27 DIAGNOSIS — I5022 Chronic systolic (congestive) heart failure: Secondary | ICD-10-CM

## 2014-04-27 DIAGNOSIS — S72002D Fracture of unspecified part of neck of left femur, subsequent encounter for closed fracture with routine healing: Secondary | ICD-10-CM

## 2014-04-27 DIAGNOSIS — N3946 Mixed incontinence: Secondary | ICD-10-CM

## 2014-04-27 DIAGNOSIS — M109 Gout, unspecified: Secondary | ICD-10-CM

## 2014-04-27 DIAGNOSIS — K5901 Slow transit constipation: Secondary | ICD-10-CM

## 2014-04-27 NOTE — Progress Notes (Signed)
Patient ID: [name], female   DOB: Nov 27, 1920, 81 y.o.   MRN: [id number]  Provider:  Gwenith Spitz. Renato Gails, D.O., C.M.D. Location:  [facility]  PCP: Ginette Otto, MD  Code Status: DNR  Allergies  Allergen Reactions  . Arthrotec [Diclofenac-Misoprostol]   . Ibuprofen   . Lactose Intolerance (Gi)   . Milk-Related Compounds Nausea And Vomiting  . Other     Grass and ragweed  . Oxycontin [Oxycodone Hcl]   . Pollen Extract     Chief Complaint  Patient presents with  . Readmit To SNF    unable to care for self in AL upon discharge from rehab after hip replacement    HPI: 81 y.o. female with h/o htn, hyperlipidemia, diverticulitis, chronic low back pain from lumbar spinal stenosis, CAD, chronic systolic chf and memory loss was readmitted to rehab after an attempt at self-care in AL.  She needs skilled care.  She does not formally have a dementia diagnosis on her epic chart from the past.  On 04/07/14, she scored 22/30 on her MMSE 04/07/14 missing points on orientation to time, place, recall of one object, and visuospatial (pentagons).  She failed her clock drawing.  This was a decline from prior to her hip fracture with left hip hemiarthroplasty.    12/29/13 she scored 25/30 and passed her clock drawing.  She is very pleasant but repeats her stories from the past frequently, and did not do well taking care of herself at home due to her functional decline from her dementia.    She is requiring ADL assist except with meals at this time.  Continues on detrol LA for her urinary incontinence.    ROS: Review of Systems  Constitutional: Negative for fever and malaise/fatigue.  HENT: Negative for congestion.   Eyes: Negative for blurred vision.  Respiratory: Negative for shortness of breath.   Cardiovascular: Negative for chest pain and leg swelling.  Gastrointestinal: Negative for constipation.  Genitourinary: Positive for urgency and frequency. Negative for dysuria and hematuria.    Musculoskeletal: Positive for joint pain and falls.  Neurological: Negative for dizziness and weakness.  Psychiatric/Behavioral: Positive for memory loss.     Past Medical History  Diagnosis Date  . Dyslipidemia   . Diverticulitis   . HTN (hypertension)   . Osteoporosis   . Spinal stenosis   . Chronic low back pain   . CAD (coronary artery disease)   . Pulmonary embolism   . Nonischemic cardiomyopathy   . Ulcer disease   . H/O: hysterectomy   . Right knee pain 05/15/12  . Contusion of foot, right 05/15/12  . Weight loss   . Memory disturbance 05/2012    MMSE 26/30, intact Clock test   Past Surgical History  Procedure Laterality Date  . Right shoulder repair  1985    Dr. [name]  . Left shoulder replacement  1991    Dr. [name]  . Appendectomy  2010  . Cataract extraction    . Colon resection  2005  . Back surgery    . Rotator cuff debridement  1999    Dr. [name]  . Coronary angioplasty with stent placement  01/2003  . Hemicolectomy    . Biv icd genertaor change out N/A 05/24/2011    Procedure: BIV ICD GENERTAOR CHANGE OUT;  Surgeon: [name], MD;  Location: [facility] CATH LAB;  Service: Cardiovascular;  Laterality: N/A;  . Hip arthroplasty Left 04/05/2014    Procedure: ARTHROPLASTY BIPOLAR HIP;  Surgeon:  Shelda Pal, MD;  Location: [facility] ORS;  Service: Orthopedics;  Laterality: Left;   Social History:   reports that she has never smoked. She does not have any smokeless tobacco history on file. She reports that she does not drink alcohol or use illicit drugs.  Family History  Problem Relation Age of Onset  . Coronary artery disease Sister   . Heart attack Father 16  . Heart attack Mother   . Other Mother     abdominal blockage    Medications: Patient's Medications  New Prescriptions   No medications on file  Previous Medications   ACETAMINOPHEN (TYLENOL) 325 MG TABLET    Take 2 tablets (650 mg total) by mouth every 6 (six) hours as needed for mild pain (or Fever >/=  101).   ALBUTEROL (PROVENTIL HFA;VENTOLIN HFA) 108 (90 BASE) MCG/ACT INHALER    Inhale 1-2 puffs into the lungs every 6 (six) hours as needed for wheezing.   ALENDRONATE (FOSAMAX) 35 MG TABLET    Take 35 mg by mouth every 7 (seven) days. Take with a full glass of water on an empty stomach.   ASPIRIN EC 325 MG TABLET    Take 1 tablet (325 mg total) by mouth 2 (two) times daily. Take for 4 weeks   CARVEDILOL (COREG) 6.25 MG TABLET    Take 1 tablet by mouth Twice daily.   CYANOCOBALAMIN 1000 MCG TABLET    Take 100 mcg by mouth daily.   DOCUSATE SODIUM 100 MG CAPS    Take 100 mg by mouth 2 (two) times daily.   DULOXETINE (CYMBALTA) 30 MG CAPSULE    Take 30 mg by mouth daily.     FEBUXOSTAT (ULORIC) 40 MG TABLET    Take 40 mg by mouth daily.   HYDROCODONE-ACETAMINOPHEN (NORCO/VICODIN) 5-325 MG PER TABLET    Take 1-2 tablets by mouth every 6 (six) hours as needed for moderate pain.   LANSOPRAZOLE (PREVACID) 15 MG CAPSULE    Take 15 mg by mouth every morning. For reflux.   NITROGLYCERIN (NITROSTAT) 0.4 MG SL TABLET    Place 0.4 mg under the tongue every 5 (five) minutes as needed for chest pain.   POLYSACCHARIDE IRON COMPLEX (POLY-IRON 150 PO)    Take 1 tablet by mouth daily.    TOLTERODINE (DETROL LA) 4 MG 24 HR CAPSULE    Take 4 mg by mouth at bedtime and may repeat dose one time if needed.   TORSEMIDE (DEMADEX) 20 MG TABLET    Take 10 mg by mouth every other day.   Modified Medications   No medications on file  Discontinued Medications   No medications on file     Physical Exam: Filed Vitals:   04/27/14 1104  BP: 127/76  Pulse: 89  Temp: 97.1 F (36.2 C)  Resp: 18  Height: 5\' 5"  (1.651 m)  Weight: 148 lb 3.2 oz (67.223 kg)  SpO2: 94%  Physical Exam  Constitutional: She is oriented to person, place, and time. She appears well-developed and well-nourished. No distress.  HENT:  Head: Normocephalic and atraumatic.  Right Ear: External ear normal.  Left Ear: External ear normal.  Nose:  Nose normal.  Mouth/Throat: Oropharynx is clear and moist. No oropharyngeal exudate.  Cardiovascular: Normal rate, regular rhythm, normal heart sounds and intact distal pulses.   Pulmonary/Chest: Effort normal and breath sounds normal.  Abdominal: Soft. Bowel sounds are normal. She exhibits no distension and no mass. There is no tenderness.  Musculoskeletal: Normal range  of motion. She exhibits no edema or tenderness.  Neurological: She is alert and oriented to person, place, and time.  Skin: Skin is warm and dry.  Psychiatric: She has a normal mood and affect.     Labs reviewed: Basic Metabolic Panel:  Recent Labs  84/13/24 0535 04/06/14 0540 04/07/14 0610 04/13/14  NA 144 139 140 143  K 3.9 3.5 3.7 3.6  CL 101 103 101  --   CO2 34* 30 31  --   GLUCOSE 124* 148* 111*  --   BUN CREATININE 0.83 0.75 1.04 0.8  CALCIUM 8.5 7.8* 8.2*  --    Liver Function Tests:  Recent Labs  04/04/14 1928 04/05/14 0535 04/06/14 0540  AST ALT ALKPHOS 70 68 56  BILITOT 0.7 1.1 0.8  PROT 6.5 6.6 5.5*  ALBUMIN 3.7 3.8 2.9*  CBC:  Recent Labs  04/04/14 1928  04/05/14 0535 04/06/14 0540 04/07/14 0610 04/13/14  WBC 11.0*  < > 16.4* 12.3* 11.4* 8.0  NEUTROABS 8.5*  --  14.4*  --   --   --   HGB 12.9  < > 13.0 11.0* 10.4* 10.6*  HCT 39.4  < > 39.8 33.2* 33.1* 32*  MCV 92.5  < > 93.6 92.7 95.1  --   PLT 207  < > 203 161 186 290  < > = values in this interval not displayed. Cardiac Enzymes:  Recent Labs  04/04/14 1928 04/05/14 0953  TROPONINI <0.03 0.03    Imaging and Procedures: CT head w/o contrast 04/04/14:  Ventricles are normal configuration. There is ventricular and sulcal enlargement reflecting age related volume loss. No parenchymal masses or mass effect. There are patchy areas of white matter hypoattenuation consistent with mild chronic microvascular ischemic change. There is no evidence of a recent cortical infarct. There are no  extra-axial masses or abnormal fluid collections. No intracranial hemorrhage.Visualized sinuses are clear. No skull fracture.  Assessment/Plan 1. Dementia without behavioral disturbance -is now requiring assist in her bathing, dressing and toileting -has clear memory loss during visit and with MMSE as in hpi -may consider beginning her on namenda XR if her family is agreeable to this  2. Closed left hip fracture, with routine healing, subsequent encounter -not needing her hydrocodone any longer -has done well with therapy, but, due to cognitive losses, is now requiring adl assistance that warrants SNF level of care vs. AL where she previously stayed  3. Chronic systolic heart failure -cont torsemide, coreg -no s/s of acute exacerbation at present  4. Mixed incontinence -will d/c detrol LA due to worsening of confusion in pt with moderate dementia -if her incontinence gets much worse, we will plan to try [id number]  5. Slow transit constipation -cont colace   6. Gout of right knee, unspecified cause, unspecified chronicity -cont uloric and f/u uric acid level  Functional status:  Requiring assistance with bathing, dressing, toileting now  Family/ staff Communication: discussed with her nurse in rehab  Labs/tests ordered:  Uric acid

## 2014-05-04 LAB — CBC AND DIFFERENTIAL
HEMATOCRIT: 33 % — AB (ref 36–46)
Hemoglobin: 10.8 g/dL — AB (ref 12.0–16.0)
PLATELETS: 262 10*3/uL (ref 150–399)
WBC: 7.2 10*3/mL

## 2014-05-06 ENCOUNTER — Encounter: Payer: Self-pay | Admitting: Adult Health

## 2014-05-06 ENCOUNTER — Non-Acute Institutional Stay (SKILLED_NURSING_FACILITY): Payer: Medicare Other | Admitting: Adult Health

## 2014-05-06 DIAGNOSIS — R42 Dizziness and giddiness: Secondary | ICD-10-CM

## 2014-05-06 DIAGNOSIS — I5022 Chronic systolic (congestive) heart failure: Secondary | ICD-10-CM

## 2014-05-06 NOTE — Progress Notes (Signed)
Patient ID: [name], female   DOB: 08-Jan-1921, 81 y.o.   MRN: [id number]    Nursing Home Location:  [facility] Service: SNF (31)    Allergies  Allergen Reactions  . Arthrotec [Diclofenac-Misoprostol]   . Ibuprofen   . Lactose Intolerance (Gi)   . Milk-Related Compounds Nausea And Vomiting  . Other     Grass and ragweed  . Oxycontin [Oxycodone Hcl]   . Pollen Extract     Chief Complaint  Patient presents with  . Acute Visit    dizziness    HPI:  Patient is a 81 y.o. female seen today at [facility] in the rehab section for complaints of dizziness upon standing and working with PT. She was in the hospital 04/04/14-04/07/14 after a mechanical fall and subsequent left hemiarthroplasty by Dr. Charlann Boxer. She was sent from the hospital to rehab to assisted living but it was felt she required more care than AL could provide so she moved back to rehab, awaiting a skilled care bed. The staff reports that she is more forgetful and frail over time. She is not drinking enough water per the staff. She did not have any SOB, DOE, CP, visual changes, facial asymmetry, etc.   Review of Systems:  Review of Systems  Constitutional: Negative for fever, chills, activity change and appetite change.  HENT: Negative for congestion, sore throat and trouble swallowing.   Respiratory: Negative for cough, shortness of breath and wheezing.   Cardiovascular: Positive for leg swelling. Negative for chest pain and palpitations.  Gastrointestinal: Positive for constipation. Negative for abdominal pain, blood in stool and abdominal distention.  Genitourinary: Negative for dysuria and difficulty urinating.  Musculoskeletal: Positive for joint swelling, arthralgias and gait problem. Negative for back pain.  Skin: Negative for color change, pallor and rash.  Neurological: Positive for dizziness. Negative for tremors, seizures, syncope, facial asymmetry, speech  difficulty and numbness.  Psychiatric/Behavioral: Positive for confusion. Negative for behavioral problems and agitation.    Past Medical History  Diagnosis Date  . Dyslipidemia   . Diverticulitis   . HTN (hypertension)   . Osteoporosis   . Spinal stenosis   . Chronic low back pain   . CAD (coronary artery disease)   . Pulmonary embolism   . Nonischemic cardiomyopathy   . Ulcer disease   . H/O: hysterectomy   . Right knee pain 05/15/12  . Contusion of foot, right 05/15/12  . Weight loss   . Memory disturbance 05/2012    MMSE 26/30, intact Clock test   Past Surgical History  Procedure Laterality Date  . Right shoulder repair  1985    Dr. [name]  . Left shoulder replacement  1991    Dr. [name]  . Appendectomy  2010  . Cataract extraction    . Colon resection  2005  . Back surgery    . Rotator cuff debridement  1999    Dr. [name]  . Coronary angioplasty with stent placement  01/2003  . Hemicolectomy    . Biv icd genertaor change out N/A 05/24/2011    Procedure: BIV ICD GENERTAOR CHANGE OUT;  Surgeon: [name], MD;  Location: [facility] CATH LAB;  Service: Cardiovascular;  Laterality: N/A;  . Hip arthroplasty Left 04/05/2014    Procedure: ARTHROPLASTY BIPOLAR HIP;  Surgeon: Shelda Pal, MD;  Location: [facility] ORS;  Service: Orthopedics;  Laterality: Left;   Social History:   reports that she has never smoked. She does  not have any smokeless tobacco history on file. She reports that she does not drink alcohol or use illicit drugs.  Family History  Problem Relation Age of Onset  . Coronary artery disease Sister   . Heart attack Father 8  . Heart attack Mother   . Other Mother     abdominal blockage    Medications: Patient's Medications  New Prescriptions   No medications on file  Previous Medications   ACETAMINOPHEN (TYLENOL) 325 MG TABLET    Take 2 tablets (650 mg total) by mouth every 6 (six) hours as needed for mild pain (or Fever >/= 101).   ALBUTEROL (PROVENTIL  HFA;VENTOLIN HFA) 108 (90 BASE) MCG/ACT INHALER    Inhale 1-2 puffs into the lungs every 6 (six) hours as needed for wheezing.   ALENDRONATE (FOSAMAX) 35 MG TABLET    Take 35 mg by mouth every 7 (seven) days. Take with a full glass of water on an empty stomach.   ASPIRIN EC 325 MG TABLET    Take 1 tablet (325 mg total) by mouth 2 (two) times daily. Take for 4 weeks   CARVEDILOL (COREG) 3.125 MG TABLET    Take 3.125 mg by mouth 2 (two) times daily with a meal.   CYANOCOBALAMIN 1000 MCG TABLET    Take 100 mcg by mouth daily.   DOCUSATE SODIUM 100 MG CAPS    Take 100 mg by mouth 2 (two) times daily.   DULOXETINE (CYMBALTA) 30 MG CAPSULE    Take 30 mg by mouth daily.     FEBUXOSTAT (ULORIC) 40 MG TABLET    Take 40 mg by mouth daily.   HYDROCODONE-ACETAMINOPHEN (NORCO/VICODIN) 5-325 MG PER TABLET    Take 1-2 tablets by mouth every 6 (six) hours as needed for moderate pain.   LANSOPRAZOLE (PREVACID) 15 MG CAPSULE    Take 15 mg by mouth every morning. For reflux.   NITROGLYCERIN (NITROSTAT) 0.4 MG SL TABLET    Place 0.4 mg under the tongue every 5 (five) minutes as needed for chest pain.   POLYSACCHARIDE IRON COMPLEX (POLY-IRON 150 PO)    Take 1 tablet by mouth daily.    TORSEMIDE (DEMADEX) 20 MG TABLET    Take 10 mg by mouth every other day.   Modified Medications   No medications on file  Discontinued Medications   CARVEDILOL (COREG) 6.25 MG TABLET    Take 1 tablet by mouth Twice daily.     Physical Exam:  Lying BP 109/65 HR 82 Sitting BP 104/58 HR 86 Standing BP 89/59 HR 90  Physical Exam  Constitutional: No distress.  frail  HENT:  Head: Normocephalic and atraumatic.  Mouth/Throat: No oropharyngeal exudate.  Eyes: Conjunctivae and EOM are normal. Pupils are equal, round, and reactive to light. Right eye exhibits no discharge. Left eye exhibits no discharge.  Neck: Normal range of motion. Neck supple. No JVD present. No tracheal deviation present. No thyromegaly present.    Cardiovascular: Normal rate and regular rhythm.   No murmur heard. Pulmonary/Chest: Effort normal and breath sounds normal. No respiratory distress. She has no wheezes.  Abdominal: Soft. Bowel sounds are normal. She exhibits no distension. There is no tenderness.  Musculoskeletal:  Strength 4/5 to BUE, 3/5 to LLE. Kyphosis noted. Antalgic gait.   Lymphadenopathy:    She has no cervical adenopathy.  Neurological: She is alert. No cranial nerve deficit. Coordination normal.  Oriented to self, situation, and place but not to time. Repeats herself. Able to f/c and  pleasant.   Skin: Skin is warm and dry. No rash noted. She is not diaphoretic. No erythema.  Psychiatric: She has a normal mood and affect.    Labs reviewed: Basic Metabolic Panel:  Recent Labs  16/10/96 0535 04/06/14 0540 04/07/14 0610 04/13/14  NA 144 139 140 143  K 3.9 3.5 3.7 3.6  CL 101 103 101  --   CO2 34* 30 31  --   GLUCOSE 124* 148* 111*  --   BUN CREATININE 0.83 0.75 1.04 0.8  CALCIUM 8.5 7.8* 8.2*  --    Liver Function Tests:  Recent Labs  04/04/14 1928 04/05/14 0535 04/06/14 0540  AST ALT ALKPHOS 70 68 56  BILITOT 0.7 1.1 0.8  PROT 6.5 6.6 5.5*  ALBUMIN 3.7 3.8 2.9*   No results for input(s): LIPASE, AMYLASE in the last 8760 hours. No results for input(s): AMMONIA in the last 8760 hours. CBC:  Recent Labs  04/04/14 1928  04/05/14 0535 04/06/14 0540 04/07/14 0610 04/13/14 05/04/14  WBC 11.0*  < > 16.4* 12.3* 11.4* 8.0 7.2  NEUTROABS 8.5*  --  14.4*  --   --   --   --   HGB 12.9  < > 13.0 11.0* 10.4* 10.6* 10.8*  HCT 39.4  < > 39.8 33.2* 33.1* 32* 33*  MCV 92.5  < > 93.6 92.7 95.1  --   --   PLT 207  < > 203 161 186 290 262  < > = values in this interval not displayed. TSH:  Recent Labs  04/06/14 0540  TSH 1.266   A1C: No results found for: HGBA1C Lipid Panel: No results for input(s): CHOL, HDL, LDLCALC, TRIG, CHOLHDL, LDLDIRECT in the last  8760 hours.  Radiological Exams reviewed: EXAM: PORTABLE PELVIS 1-2 VIEWS   COMPARISON:  04/04/2014   FINDINGS: Left hip hemiarthroplasty is in place. Anatomic alignment. No breakage or loosening of the hardware. Osteopenia. Chronic deformity of the pubic bones bilaterally.   IMPRESSION: Left hip hemiarthroplasty anatomically aligned. EXAM: DG HIP W/ PWLVIS 4+V*L*   COMPARISON:  None.   FINDINGS: There is a fracture of the left femoral neck. Fracture is mid cervical with a separate fracture component of the subcapital femoral neck along the superior margin of the femoral head. Shaft component has migrated superiorly by approximately 1.5 cm. There is varus angulation and approximately 30 degrees of apex anterior angulation.   Bones are extensively demineralized. There are old pubic rami fractures bilaterally. No other acute fractures. No dislocation.   IMPRESSION: Displaced fracture of the left femoral neck with varus and apex anterior angulation. EXAM: CT HEAD WITHOUT CONTRAST   CT CERVICAL SPINE WITHOUT CONTRAST   TECHNIQUE: Multidetector CT imaging of the head and cervical spine was performed following the standard protocol without intravenous contrast. Multiplanar CT image reconstructions of the cervical spine were also generated.   COMPARISON:  None.   FINDINGS: CT HEAD FINDINGS   Ventricles are normal configuration. There is ventricular and sulcal enlargement reflecting age related volume loss. No parenchymal masses or mass effect. There are patchy areas of white matter hypoattenuation consistent with mild chronic microvascular ischemic change.   There is no evidence of a recent cortical infarct.   There are no extra-axial masses or abnormal fluid collections.   No intracranial hemorrhage.   Visualized sinuses are clear.  No skull fracture.   CT CERVICAL SPINE FINDINGS   No fracture. No  spondylolisthesis. Mild loss of disc height with disc  bulging is noted most evident at C3-C4. There are facet degenerative changes that are most evident on the left at C3-C4. Moderate neural foraminal narrowing is noted on the left at this level. Bones are diffusely demineralized. Soft tissues are unremarkable. Lung apices are clear.   IMPRESSION: HEAD CT:  No acute intracranial abnormalities.  No skull fracture.   CERVICAL CT:  No fracture or acute finding.   Assessment/Plan  1. Dizziness Noted orthostatic hypotension today. Will check BMP and encourage fluids. CBC on 05/04/14 WNL.  2. Chronic systolic heart failure Asymptomatic, due to low SBP in the 80's and dizziness will reduce Coreg to 3.125mg  BID. She is at risk for falls, as she recently had a left hip fx and we will have to monitor her BP closely and follow fall prec   [name], ANP [facility] [phone]

## 2014-05-10 ENCOUNTER — Non-Acute Institutional Stay (SKILLED_NURSING_FACILITY): Payer: Medicare Other | Admitting: Adult Health

## 2014-05-10 ENCOUNTER — Encounter: Payer: Self-pay | Admitting: Adult Health

## 2014-05-10 DIAGNOSIS — M1990 Unspecified osteoarthritis, unspecified site: Secondary | ICD-10-CM | POA: Insufficient documentation

## 2014-05-10 DIAGNOSIS — R42 Dizziness and giddiness: Secondary | ICD-10-CM

## 2014-05-10 DIAGNOSIS — M109 Gout, unspecified: Secondary | ICD-10-CM | POA: Insufficient documentation

## 2014-05-10 DIAGNOSIS — M10032 Idiopathic gout, left wrist: Secondary | ICD-10-CM

## 2014-05-10 DIAGNOSIS — M158 Other polyosteoarthritis: Secondary | ICD-10-CM

## 2014-05-10 NOTE — Progress Notes (Signed)
Patient ID: [name], female   DOB: [birth year], 81 y.o.   MRN: [id number]    Nursing Home Location:  [facility]: SNF (31)    Allergies  Allergen Reactions  . Arthrotec [Diclofenac-Misoprostol]   . Ibuprofen   . Lactose Intolerance (Gi)   . Milk-Related Compounds Nausea And Vomiting  . Other     Grass and ragweed  . Oxycontin [Oxycodone Hcl]   . Pollen Extract     Chief Complaint  Patient presents with  . Acute Visit    left wrist pain, left knee pain (chronic)    HPI:  Patient is a 81 y.o. female seen today at [facility] in the rehab section.  She was in the hospital 04/04/14-04/07/14 after a mechanical fall and subsequent left hemiarthroplasty by Dr. Charlann Boxer. She was sent from the hospital to rehab to assisted living but it was felt she required more care than AL could provide so she moved back to rehab, awaiting a skilled care bed. I saw her on 05/06/14 for orthostatic hypotension. Her BMP was normal at that time. Coreg was decreased to 3.125mg  BID and since that time she has not reported dizziness when standing.  I was asked to see her today due to chronic pain in her knees, especially the left when ambulating with the walker. She has has a red and swollen left wrist. She has a hx of gout and the most recent uric acid level was 2.7.  She has not had any more falls since Jan. She is not able to work with therapy today due to pain.   Review of Systems:  Review of Systems  Constitutional: Negative for fever, chills, activity change and appetite change.  HENT: Negative for congestion, sore throat and trouble swallowing.   Respiratory: Negative for cough, shortness of breath and wheezing.   Cardiovascular: Positive for leg swelling. Negative for chest pain and palpitations.  Gastrointestinal: Positive for constipation. Negative for abdominal pain, blood in stool and abdominal distention.  Genitourinary: Negative for  dysuria and difficulty urinating.  Musculoskeletal: Positive for joint swelling, arthralgias and gait problem. Negative for back pain.  Skin: Negative for color change, pallor and rash.  Neurological: Positive for dizziness. Negative for tremors, seizures, syncope, facial asymmetry, speech difficulty and numbness.  Psychiatric/Behavioral: Positive for confusion. Negative for behavioral problems and agitation.    Past Medical History  Diagnosis Date  . Dyslipidemia   . Diverticulitis   . HTN (hypertension)   . Osteoporosis   . Spinal stenosis   . Chronic low back pain   . CAD (coronary artery disease)   . Pulmonary embolism   . Nonischemic cardiomyopathy   . Ulcer disease   . H/O: hysterectomy   . Right knee pain 05/15/12  . Contusion of foot, right 05/15/12  . Weight loss   . Memory disturbance 05/2012    MMSE 26/30, intact Clock test   Past Surgical History  Procedure Laterality Date  . Right shoulder repair  1985    Dr. [name]  . Left shoulder replacement  1991    Dr. [name]  . Appendectomy  2010  . Cataract extraction    . Colon resection  2005  . Back surgery    . Rotator cuff debridement  1999    Dr. [name]  . Coronary angioplasty with stent placement  01/2003  . Hemicolectomy    . Biv icd genertaor change out N/A 05/24/2011    Procedure: BIV  ICD GENERTAOR CHANGE OUT;  Surgeon: [name], MD;  Location: [facility] CATH LAB;  Service: Cardiovascular;  Laterality: N/A;  . Hip arthroplasty Left 04/05/2014    Procedure: ARTHROPLASTY BIPOLAR HIP;  Surgeon: Shelda Pal, MD;  Location: [facility] ORS;  Service: Orthopedics;  Laterality: Left;   Social History:   reports that she has never smoked. She does not have any smokeless tobacco history on file. She reports that she does not drink alcohol or use illicit drugs.  Family History  Problem Relation Age of Onset  . Coronary artery disease Sister   . Heart attack Father 61  . Heart attack Mother   . Other Mother     abdominal blockage     Medications: Patient's Medications  New Prescriptions   No medications on file  Previous Medications   ACETAMINOPHEN (TYLENOL) 325 MG TABLET    Take 2 tablets (650 mg total) by mouth every 6 (six) hours as needed for mild pain (or Fever >/= 101).   ALBUTEROL (PROVENTIL HFA;VENTOLIN HFA) 108 (90 BASE) MCG/ACT INHALER    Inhale 1-2 puffs into the lungs every 6 (six) hours as needed for wheezing.   ALENDRONATE (FOSAMAX) 35 MG TABLET    Take 35 mg by mouth every 7 (seven) days. Take with a full glass of water on an empty stomach.   ASPIRIN EC 325 MG TABLET    Take 1 tablet (325 mg total) by mouth 2 (two) times daily. Take for 4 weeks   CARVEDILOL (COREG) 3.125 MG TABLET    Take 3.125 mg by mouth 2 (two) times daily with a meal.   CYANOCOBALAMIN 1000 MCG TABLET    Take 100 mcg by mouth daily.   DOCUSATE SODIUM 100 MG CAPS    Take 100 mg by mouth 2 (two) times daily.   DULOXETINE (CYMBALTA) 30 MG CAPSULE    Take 30 mg by mouth daily.     FEBUXOSTAT (ULORIC) 40 MG TABLET    Take 40 mg by mouth daily.   HYDROCODONE-ACETAMINOPHEN (NORCO/VICODIN) 5-325 MG PER TABLET    Take 1-2 tablets by mouth every 6 (six) hours as needed for moderate pain.   LANSOPRAZOLE (PREVACID) 15 MG CAPSULE    Take 15 mg by mouth every morning. For reflux.   NITROGLYCERIN (NITROSTAT) 0.4 MG SL TABLET    Place 0.4 mg under the tongue every 5 (five) minutes as needed for chest pain.   POLYSACCHARIDE IRON COMPLEX (POLY-IRON 150 PO)    Take 1 tablet by mouth daily.    TORSEMIDE (DEMADEX) 20 MG TABLET    Take 10 mg by mouth every other day.   Modified Medications   No medications on file  Discontinued Medications   No medications on file     Physical Exam:  Lying BP 109/65 HR 82 Sitting BP 104/58 HR 86 Standing BP 89/59 HR 90  Physical Exam  Constitutional: No distress.  frail  HENT:  Head: Normocephalic and atraumatic.  Mouth/Throat: No oropharyngeal exudate.  Eyes: Conjunctivae and EOM are normal. Pupils are  equal, round, and reactive to light. Right eye exhibits no discharge. Left eye exhibits no discharge.  Neck: Normal range of motion. Neck supple. No JVD present. No tracheal deviation present. No thyromegaly present.  Cardiovascular: Normal rate and regular rhythm.   No murmur heard. Pulmonary/Chest: Effort normal and breath sounds normal. No respiratory distress. She has no wheezes.  Abdominal: Soft. Bowel sounds are normal. She exhibits no distension. There is no tenderness.  Musculoskeletal:  Strength 4/5 to BUE, 3/5 to LLE. Kyphosis noted. Antalgic gait. Left wrist with erythema, tenderness, and swelling. Bilat knees with full ROM, no tenderness, swelling, or erythema. No crepitus.   Lymphadenopathy:    She has no cervical adenopathy.  Neurological: She is alert. No cranial nerve deficit. Coordination normal.  Oriented to self, situation, and place but not to time. Repeats herself. Able to f/c and pleasant.   Skin: Skin is warm and dry. No rash noted. She is not diaphoretic. No erythema.  Psychiatric: She has a normal mood and affect.    Labs reviewed: Basic Metabolic Panel:  Recent Labs  16/10/96 0535 04/06/14 0540 04/07/14 0610 04/13/14  NA 144 139 140 143  K 3.9 3.5 3.7 3.6  CL 101 103 101  --   CO2 34* 30 31  --   GLUCOSE 124* 148* 111*  --   BUN CREATININE 0.83 0.75 1.04 0.8  CALCIUM 8.5 7.8* 8.2*  --    Liver Function Tests:  Recent Labs  04/04/14 1928 04/05/14 0535 04/06/14 0540  AST ALT ALKPHOS 70 68 56  BILITOT 0.7 1.1 0.8  PROT 6.5 6.6 5.5*  ALBUMIN 3.7 3.8 2.9*   No results for input(s): LIPASE, AMYLASE in the last 8760 hours. No results for input(s): AMMONIA in the last 8760 hours. CBC:  Recent Labs  04/04/14 1928  04/05/14 0535 04/06/14 0540 04/07/14 0610 04/13/14 05/04/14  WBC 11.0*  < > 16.4* 12.3* 11.4* 8.0 7.2  NEUTROABS 8.5*  --  14.4*  --   --   --   --   HGB 12.9  < > 13.0 11.0* 10.4* 10.6* 10.8*  HCT  39.4  < > 39.8 33.2* 33.1* 32* 33*  MCV 92.5  < > 93.6 92.7 95.1  --   --   PLT 207  < > 203 161 186 290 262  < > = values in this interval not displayed. TSH:  Recent Labs  04/06/14 0540  TSH 1.266   A1C: No results found for: HGBA1C Lipid Panel: No results for input(s): CHOL, HDL, LDLCALC, TRIG, CHOLHDL, LDLDIRECT in the last 8760 hours.  Radiological Exams reviewed: EXAM: PORTABLE PELVIS 1-2 VIEWS   COMPARISON:  04/04/2014   FINDINGS: Left hip hemiarthroplasty is in place. Anatomic alignment. No breakage or loosening of the hardware. Osteopenia. Chronic deformity of the pubic bones bilaterally.   IMPRESSION: Left hip hemiarthroplasty anatomically aligned. EXAM: DG HIP W/ PWLVIS 4+V*L*   COMPARISON:  None.   FINDINGS: There is a fracture of the left femoral neck. Fracture is mid cervical with a separate fracture component of the subcapital femoral neck along the superior margin of the femoral head. Shaft component has migrated superiorly by approximately 1.5 cm. There is varus angulation and approximately 30 degrees of apex anterior angulation.   Bones are extensively demineralized. There are old pubic rami fractures bilaterally. No other acute fractures. No dislocation.   IMPRESSION: Displaced fracture of the left femoral neck with varus and apex anterior angulation. EXAM: CT HEAD WITHOUT CONTRAST   CT CERVICAL SPINE WITHOUT CONTRAST   TECHNIQUE: Multidetector CT imaging of the head and cervical spine was performed following the standard protocol without intravenous contrast. Multiplanar CT image reconstructions of the cervical spine were also generated.   COMPARISON:  None.   FINDINGS: CT HEAD FINDINGS   Ventricles are normal configuration. There is ventricular and sulcal enlargement reflecting age related  volume loss. No parenchymal masses or mass effect. There are patchy areas of white matter hypoattenuation consistent with mild chronic  microvascular ischemic change.   There is no evidence of a recent cortical infarct.   There are no extra-axial masses or abnormal fluid collections.   No intracranial hemorrhage.   Visualized sinuses are clear.  No skull fracture.   CT CERVICAL SPINE FINDINGS   No fracture. No spondylolisthesis. Mild loss of disc height with disc bulging is noted most evident at C3-C4. There are facet degenerative changes that are most evident on the left at C3-C4. Moderate neural foraminal narrowing is noted on the left at this level. Bones are diffusely demineralized. Soft tissues are unremarkable. Lung apices are clear.   IMPRESSION: HEAD CT:  No acute intracranial abnormalities.  No skull fracture.   CERVICAL CT:  No fracture or acute finding.   Assessment/Plan  1. Other osteoarthritis involving multiple joints No acute findings on the left knee, most likely OA. Try Tylenol 650mg  BID scheduled, may have prn, not to exceed 4gm in 24 hrs  2. Acute gout of left wrist, unspecified cause Acute with significant pain. Begin Colchicine 1.2 mg now and 0.6 mg in 1 hr. Recheck uric acid, if elevated would increase Uloric.  3. Dizziness Improved, most likely med effect with decreased intake.  [name], ANP [facility] [phone]     [name], ANP [facility] [phone]

## 2014-05-11 LAB — CBC AND DIFFERENTIAL
HEMATOCRIT: 30 % — AB (ref 36–46)
Hemoglobin: 10.3 g/dL — AB (ref 12.0–16.0)
Platelets: 237 10*3/uL (ref 150–399)
WBC: 7.8 10^3/mL

## 2014-05-27 ENCOUNTER — Encounter: Payer: Self-pay | Admitting: Internal Medicine

## 2014-06-29 ENCOUNTER — Ambulatory Visit (INDEPENDENT_AMBULATORY_CARE_PROVIDER_SITE_OTHER): Payer: Medicare Other | Admitting: Internal Medicine

## 2014-06-29 ENCOUNTER — Encounter: Payer: Self-pay | Admitting: Internal Medicine

## 2014-06-29 VITALS — BP 112/60 | HR 78 | Ht 61.5 in | Wt 147.4 lb

## 2014-06-29 DIAGNOSIS — Z950 Presence of cardiac pacemaker: Secondary | ICD-10-CM | POA: Diagnosis not present

## 2014-06-29 DIAGNOSIS — I5022 Chronic systolic (congestive) heart failure: Secondary | ICD-10-CM

## 2014-06-29 DIAGNOSIS — R002 Palpitations: Secondary | ICD-10-CM

## 2014-06-29 LAB — MDC_IDC_ENUM_SESS_TYPE_INCLINIC
Battery Impedance: 227 Ohm
Battery Remaining Longevity: 109 mo
Brady Statistic AP VP Percent: 22 %
Brady Statistic AP VS Percent: 3 %
Brady Statistic AS VS Percent: 65 %
Date Time Interrogation Session: 20160412120600
Lead Channel Impedance Value: 398 Ohm
Lead Channel Impedance Value: 698 Ohm
Lead Channel Pacing Threshold Amplitude: 0.75 V
Lead Channel Pacing Threshold Pulse Width: 0.4 ms
Lead Channel Pacing Threshold Pulse Width: 0.4 ms
Lead Channel Sensing Intrinsic Amplitude: 2.8 mV
Lead Channel Sensing Intrinsic Amplitude: 22.4 mV
Lead Channel Setting Pacing Amplitude: 2 V
Lead Channel Setting Sensing Sensitivity: 5.6 mV
MDC IDC MSMT BATTERY VOLTAGE: 2.78 V
MDC IDC MSMT LEADCHNL RV PACING THRESHOLD AMPLITUDE: 0.75 V
MDC IDC SET LEADCHNL RV PACING AMPLITUDE: 2.5 V
MDC IDC SET LEADCHNL RV PACING PULSEWIDTH: 0.4 ms
MDC IDC STAT BRADY AS VP PERCENT: 10 %

## 2014-06-29 NOTE — Assessment & Plan Note (Signed)
These are currently well-controlled. No change in medications today.

## 2014-06-29 NOTE — Assessment & Plan Note (Signed)
Her Medtronic dual-chamber pacemaker is working normally. We'll plan to recheck in several months. 

## 2014-06-29 NOTE — Assessment & Plan Note (Signed)
Her symptoms are class II. She is limited mostly by mobility. She'll continue her current medical therapy and maintain a low-sodium diet.

## 2014-06-29 NOTE — Patient Instructions (Signed)

## 2014-06-29 NOTE — Progress Notes (Signed)
HPI [name] returns today for followup. She is a very pleasant 81 year old woman with a history of symptomatic bradycardia, status post permanent pacemaker insertion, coronary artery disease, hypertension, and arthritis.  No peripheral edema. She has chronic knee pain and weakness. She has moved into the assisted living section of [facility]. No chest pain or sob. Minimal peripheral edema. In the interim, she has fractured her hip and undergone successful repair.  Allergies  Allergen Reactions  . Arthrotec [Diclofenac-Misoprostol]   . Ibuprofen   . Lactose Intolerance (Gi)   . Milk-Related Compounds Nausea And Vomiting  . Other     Grass and ragweed  . Oxycontin [Oxycodone Hcl]   . Pollen Extract      Current Outpatient Prescriptions  Medication Sig Dispense Refill  . acetaminophen (TYLENOL) 325 MG tablet Take 2 tablets (650 mg total) by mouth every 6 (six) hours as needed for mild pain (or Fever >/= 101). 60 tablet 2  . albuterol (PROVENTIL HFA;VENTOLIN HFA) 108 (90 BASE) MCG/ACT inhaler Inhale 1-2 puffs into the lungs every 6 (six) hours as needed for wheezing. 1 Inhaler 0  . alendronate (FOSAMAX) 35 MG tablet Take 35 mg by mouth every 7 (seven) days. Take with a full glass of water on an empty stomach.    Marland Kitchen aspirin EC 325 MG tablet Take 1 tablet (325 mg total) by mouth 2 (two) times daily. Take for 4 weeks 60 tablet 0  . carvedilol (COREG) 3.125 MG tablet Take 3.125 mg by mouth 2 (two) times daily with a meal.    . cyanocobalamin 1000 MCG tablet Take 100 mcg by mouth daily.    Marland Kitchen docusate sodium 100 MG CAPS Take 100 mg by mouth 2 (two) times daily. 10 capsule 0  . DULoxetine (CYMBALTA) 30 MG capsule Take 30 mg by mouth daily.      . febuxostat (ULORIC) 40 MG tablet Take 40 mg by mouth daily.    Marland Kitchen HYDROcodone-acetaminophen (NORCO/VICODIN) 5-325 MG per tablet Take 1-2 tablets by mouth every 6 (six) hours as needed for moderate pain. 30 tablet 0  . lansoprazole (PREVACID) 15 MG  capsule Take 15 mg by mouth every morning. For reflux.    . nitroGLYCERIN (NITROSTAT) 0.4 MG SL tablet Place 0.4 mg under the tongue every 5 (five) minutes as needed for chest pain.    . Polysaccharide Iron Complex (POLY-IRON 150 PO) Take 1 tablet by mouth daily.     Marland Kitchen torsemide (DEMADEX) 10 MG tablet Take 10 mg by mouth every other day.  98   No current facility-administered medications for this visit.     Past Medical History  Diagnosis Date  . Dyslipidemia   . Diverticulitis   . HTN (hypertension)   . Osteoporosis   . Spinal stenosis   . Chronic low back pain   . CAD (coronary artery disease)   . Pulmonary embolism   . Nonischemic cardiomyopathy   . Ulcer disease   . H/O: hysterectomy   . Right knee pain 05/15/12  . Contusion of foot, right 05/15/12  . Weight loss   . Memory disturbance 05/2012    MMSE 26/30, intact Clock test    ROS:   All systems reviewed and negative except as noted in the HPI.   Past Surgical History  Procedure Laterality Date  . Right shoulder repair  1985    Dr. [name]  . Left shoulder replacement  1991    Dr. [name]  . Appendectomy  2010  . Cataract  extraction    . Colon resection  2005  . Back surgery    . Rotator cuff debridement  1999    Dr. [name]  . Coronary angioplasty with stent placement  01/2003  . Hemicolectomy    . Biv icd genertaor change out N/A 05/24/2011    Procedure: BIV ICD GENERTAOR CHANGE OUT;  Surgeon: [name], MD;  Location: [facility] CATH LAB;  Service: Cardiovascular;  Laterality: N/A;  . Hip arthroplasty Left 04/05/2014    Procedure: ARTHROPLASTY BIPOLAR HIP;  Surgeon: Shelda Pal, MD;  Location: [facility] ORS;  Service: Orthopedics;  Laterality: Left;     Family History  Problem Relation Age of Onset  . Coronary artery disease Sister   . Heart attack Father 67  . Heart attack Mother   . Other Mother     abdominal blockage     History   Social History  . Marital Status: Widowed    Spouse Name: N/A  . Number of  Children: N/A  . Years of Education: N/A   Occupational History  . Not on file.   Social History Main Topics  . Smoking status: Never Smoker   . Smokeless tobacco: Not on file  . Alcohol Use: No  . Drug Use: No  . Sexual Activity: Not on file   Other Topics Concern  . Not on file   Social History Narrative     BP 112/60 mmHg  Pulse 78  Ht 5' 1.5" (1.562 m)  Wt 147 lb 6.4 oz (66.86 kg)  BMI 27.40 kg/m2  Physical Exam:  Elderly but well appearing 81 year old woman,NAD HEENT: Unremarkable Neck:  7 cm JVD, no thyromegally Lungs:  Clear with no wheezes, rales, or rhonchi. HEART:  Regular rate rhythm, no murmurs, no rubs, no clicks Abd:  soft, positive bowel sounds, no organomegally, no rebound, no guarding Ext:  2 plus pulses, no peripheral edema, no cyanosis, no clubbing Skin:  No rashes no nodules Neuro:  CN II through XII intact, motor grossly intact  DEVICE  Normal device function.  See PaceArt for details.   Assess/Plan:

## 2014-07-01 ENCOUNTER — Encounter: Payer: Self-pay | Admitting: Adult Health

## 2014-07-01 ENCOUNTER — Non-Acute Institutional Stay (SKILLED_NURSING_FACILITY): Payer: Medicare Other | Admitting: Adult Health

## 2014-07-01 DIAGNOSIS — R413 Other amnesia: Secondary | ICD-10-CM | POA: Diagnosis not present

## 2014-07-01 DIAGNOSIS — K5901 Slow transit constipation: Secondary | ICD-10-CM | POA: Diagnosis not present

## 2014-07-01 DIAGNOSIS — I5022 Chronic systolic (congestive) heart failure: Secondary | ICD-10-CM | POA: Diagnosis not present

## 2014-07-01 DIAGNOSIS — D649 Anemia, unspecified: Secondary | ICD-10-CM | POA: Diagnosis not present

## 2014-07-01 NOTE — Progress Notes (Signed)
Patient ID: [name], female   DOB: [birth year], 81 y.o.   MRN: [id number]    Nursing Home Location:  [facility]   Code Status: DNR   Place of Service: SNF (31)  Chief Complaint  Patient presents with  . Medical Management of Chronic Issues    HPI:  81 y.o.female  residing at [name], skilled care section. I am her to review her chronic medical issues. She has a hx of CHF, OA, symptomatic pacemaker insertion, dementia, hip fx,anemia, and HLD. She is currently in rehab awaiting a skilled bed. She currently is ambulatory with a walker but due to her dementia was not able to stay in AL, so she was moved to rehab.  She has no complaints today. Her last MMSE was 22/30 with a failed clock on 04/07/14.  She was started on Miralax for constipation in March and denies difficulty with BMs now. She had some dizziness when standing in February and her Coreg was reduced 3.125 mg BID with resolution of symptoms.     Review of Systems:  Review of Systems  Constitutional: Negative for fever, chills, activity change and appetite change.  HENT: Negative for congestion and trouble swallowing.   Respiratory: Negative for cough, shortness of breath and wheezing.   Cardiovascular: Positive for leg swelling. Negative for chest pain.  Gastrointestinal: Negative for constipation and abdominal distention.  Genitourinary: Negative for dysuria.  Musculoskeletal: Positive for arthralgias and gait problem.       Uses walker  Skin: Negative for rash and wound.  Psychiatric/Behavioral: Negative for agitation.    Medications: Patient's Medications  New Prescriptions   No medications on file  Previous Medications   ACETAMINOPHEN (TYLENOL) 325 MG TABLET    Take 2 tablets (650 mg total) by mouth every 6 (six) hours as needed for mild pain (or Fever >/= 101).   ALBUTEROL (PROVENTIL HFA;VENTOLIN HFA) 108 (90 BASE) MCG/ACT INHALER    Inhale 1-2 puffs into the lungs  every 6 (six) hours as needed for wheezing.   ALENDRONATE (FOSAMAX) 35 MG TABLET    Take 35 mg by mouth every 7 (seven) days. Take with a full glass of water on an empty stomach.   ASPIRIN EC 325 MG TABLET    Take 1 tablet (325 mg total) by mouth 2 (two) times daily. Take for 4 weeks   CARVEDILOL (COREG) 3.125 MG TABLET    Take 3.125 mg by mouth 2 (two) times daily with a meal.   CYANOCOBALAMIN 1000 MCG TABLET    Take 100 mcg by mouth daily.   DOCUSATE SODIUM 100 MG CAPS    Take 100 mg by mouth 2 (two) times daily.   DULOXETINE (CYMBALTA) 30 MG CAPSULE    Take 30 mg by mouth daily.     FEBUXOSTAT (ULORIC) 40 MG TABLET    Take 40 mg by mouth daily.   HYDROCODONE-ACETAMINOPHEN (NORCO/VICODIN) 5-325 MG PER TABLET    Take 1-2 tablets by mouth every 6 (six) hours as needed for moderate pain.   LANSOPRAZOLE (PREVACID) 15 MG CAPSULE    Take 15 mg by mouth every morning. For reflux.   NITROGLYCERIN (NITROSTAT) 0.4 MG SL TABLET    Place 0.4 mg under the tongue every 5 (five) minutes as needed for chest pain.   POLYETHYLENE GLYCOL (MIRALAX / GLYCOLAX) PACKET    Take 17 g by mouth daily.   POLYSACCHARIDE IRON COMPLEX (POLY-IRON 150 PO)    Take 1 tablet by mouth daily.  TORSEMIDE (DEMADEX) 10 MG TABLET    Take 10 mg by mouth every other day.  Modified Medications   No medications on file  Discontinued Medications   No medications on file     Physical Exam:  Filed Vitals:   07/01/14 1501  BP: 139/84  Pulse: 70  Temp: 97 F (36.1 C)  Resp: 16  Weight: 143 lb (64.864 kg)  SpO2: 96%    Physical Exam  Constitutional: No distress.  Neck: No JVD present. No thyromegaly present.  Cardiovascular: Normal rate and regular rhythm.   No murmur heard. Pulmonary/Chest: Effort normal and breath sounds normal. No respiratory distress. She has no wheezes.  Abdominal: Soft. Bowel sounds are normal. She exhibits no distension. There is no tenderness.  Musculoskeletal: She exhibits no edema or tenderness.    Neurological: She is alert. No cranial nerve deficit.  Oriented x2, f/c  Skin: Skin is warm and dry. She is not diaphoretic.  Psychiatric: Affect normal.    Labs reviewed/Significant Diagnostic Results:  Basic Metabolic Panel:  Recent Labs  03/47/42 0535 04/06/14 0540 04/07/14 0610 04/13/14  NA 144 139 140 143  K 3.9 3.5 3.7 3.6  CL 101 103 101  --   CO2 34* 30 31  --   GLUCOSE 124* 148* 111*  --   BUN 15 15 23 11   CREATININE 0.83 0.75 1.04 0.8  CALCIUM 8.5 7.8* 8.2*  --    Liver Function Tests:  Recent Labs  04/04/14 1928 04/05/14 0535 04/06/14 0540  AST 20 21 20   ALT 11 11 10   ALKPHOS 70 68 56  BILITOT 0.7 1.1 0.8  PROT 6.5 6.6 5.5*  ALBUMIN 3.7 3.8 2.9*   No results for input(s): LIPASE, AMYLASE in the last 8760 hours. No results for input(s): AMMONIA in the last 8760 hours. CBC:  Recent Labs  04/04/14 1928  04/05/14 0535 04/06/14 0540 04/07/14 0610 04/13/14 05/04/14 05/11/14  WBC 11.0*  < > 16.4* 12.3* 11.4* 8.0 7.2 7.8  NEUTROABS 8.5*  --  14.4*  --   --   --   --   --   HGB 12.9  < > 13.0 11.0* 10.4* 10.6* 10.8* 10.3*  HCT 39.4  < > 39.8 33.2* 33.1* 32* 33* 30*  MCV 92.5  < > 93.6 92.7 95.1  --   --   --   PLT 207  < > 203 161 186 290 262 237  < > = values in this interval not displayed. CBG: No results for input(s): GLUCAP in the last 8760 hours. TSH:  Recent Labs  04/06/14 0540  TSH 1.266       Assessment/Plan  1. Chronic systolic heart failure Followed by Dr. Ladona Ridgel with cardiology. Her weight is stable. Continue heart healthy diet and current meds.   2. Memory disturbance Consider Namenda if resident's family chooses to change to [facility] as her primary doc. I have asked the staff to perform another MMSE  3. Anemia, unspecified anemia type Continue Iron, CBC stable  4. Slow transit constipation Improved, continue miralax     [name], ANP [facility] [phone]

## 2014-09-28 ENCOUNTER — Ambulatory Visit (INDEPENDENT_AMBULATORY_CARE_PROVIDER_SITE_OTHER): Payer: Medicare Other | Admitting: *Deleted

## 2014-09-28 ENCOUNTER — Encounter: Payer: Self-pay | Admitting: Internal Medicine

## 2014-09-28 ENCOUNTER — Telehealth: Payer: Self-pay | Admitting: Cardiology

## 2014-09-28 DIAGNOSIS — R001 Bradycardia, unspecified: Secondary | ICD-10-CM

## 2014-09-28 NOTE — Progress Notes (Signed)
Remote pacemaker transmission.   

## 2014-09-28 NOTE — Telephone Encounter (Signed)
Confirmed remote transmission w/ pt daughter in law.   

## 2014-10-04 LAB — CUP PACEART REMOTE DEVICE CHECK
Brady Statistic AS VP Percent: 7 %
Brady Statistic AS VS Percent: 51 %
Date Time Interrogation Session: 20160712164832
Lead Channel Impedance Value: 420 Ohm
Lead Channel Pacing Threshold Amplitude: 0.75 V
Lead Channel Pacing Threshold Pulse Width: 0.4 ms
Lead Channel Setting Pacing Amplitude: 2 V
MDC IDC MSMT BATTERY IMPEDANCE: 276 Ohm
MDC IDC MSMT BATTERY REMAINING LONGEVITY: 99 mo
MDC IDC MSMT BATTERY VOLTAGE: 2.78 V
MDC IDC MSMT LEADCHNL RA PACING THRESHOLD AMPLITUDE: 0.625 V
MDC IDC MSMT LEADCHNL RA PACING THRESHOLD PULSEWIDTH: 0.4 ms
MDC IDC MSMT LEADCHNL RA SENSING INTR AMPL: 2.8 mV
MDC IDC MSMT LEADCHNL RV IMPEDANCE VALUE: 737 Ohm
MDC IDC SET LEADCHNL RV PACING AMPLITUDE: 2.5 V
MDC IDC SET LEADCHNL RV PACING PULSEWIDTH: 0.4 ms
MDC IDC SET LEADCHNL RV SENSING SENSITIVITY: 5.6 mV
MDC IDC STAT BRADY AP VP PERCENT: 38 %
MDC IDC STAT BRADY AP VS PERCENT: 4 %

## 2014-10-22 ENCOUNTER — Encounter: Payer: Self-pay | Admitting: Cardiology

## 2014-12-30 ENCOUNTER — Telehealth: Payer: Self-pay | Admitting: Cardiology

## 2014-12-30 ENCOUNTER — Ambulatory Visit (INDEPENDENT_AMBULATORY_CARE_PROVIDER_SITE_OTHER): Payer: Medicare Other | Admitting: *Deleted

## 2014-12-30 DIAGNOSIS — R001 Bradycardia, unspecified: Secondary | ICD-10-CM

## 2014-12-30 NOTE — Telephone Encounter (Signed)
Confirmed remote transmission w/ pt daughter in law.   

## 2014-12-31 LAB — CUP PACEART REMOTE DEVICE CHECK
Battery Voltage: 2.78 V
Brady Statistic AP VS Percent: 3 %
Brady Statistic AS VP Percent: 7 %
Date Time Interrogation Session: 20161013220422
Implantable Lead Implant Date: 20071005
Implantable Lead Implant Date: 20071005
Implantable Lead Location: 753858
Implantable Lead Model: 4194
Lead Channel Impedance Value: 432 Ohm
Lead Channel Setting Pacing Amplitude: 2 V
Lead Channel Setting Pacing Amplitude: 2.5 V
Lead Channel Setting Pacing Pulse Width: 0.4 ms
MDC IDC LEAD LOCATION: 753859
MDC IDC MSMT BATTERY IMPEDANCE: 276 Ohm
MDC IDC MSMT BATTERY REMAINING LONGEVITY: 100 mo
MDC IDC MSMT LEADCHNL RV IMPEDANCE VALUE: 820 Ohm
MDC IDC SET LEADCHNL RV SENSING SENSITIVITY: 5.6 mV
MDC IDC STAT BRADY AP VP PERCENT: 39 %
MDC IDC STAT BRADY AS VS PERCENT: 51 %

## 2015-01-03 NOTE — Progress Notes (Signed)
Remote pacemaker transmission.   

## 2015-01-04 ENCOUNTER — Encounter: Payer: Self-pay | Admitting: Cardiology

## 2015-03-31 ENCOUNTER — Telehealth: Payer: Self-pay | Admitting: Cardiology

## 2015-03-31 ENCOUNTER — Ambulatory Visit (INDEPENDENT_AMBULATORY_CARE_PROVIDER_SITE_OTHER): Payer: Medicare Other | Admitting: *Deleted

## 2015-03-31 DIAGNOSIS — I429 Cardiomyopathy, unspecified: Secondary | ICD-10-CM | POA: Diagnosis not present

## 2015-03-31 DIAGNOSIS — I428 Other cardiomyopathies: Secondary | ICD-10-CM

## 2015-03-31 NOTE — Telephone Encounter (Signed)
LMOVM reminding pt to send remote transmission.   

## 2015-04-04 NOTE — Progress Notes (Signed)
Remote pacemaker transmission.   

## 2015-04-08 LAB — CUP PACEART REMOTE DEVICE CHECK
Battery Impedance: 326 Ohm
Battery Remaining Longevity: 94 mo
Battery Voltage: 2.78 V
Brady Statistic AP VP Percent: 40 %
Implantable Lead Implant Date: 20071005
Implantable Lead Implant Date: 20071005
Implantable Lead Location: 753859
Implantable Lead Model: 4194
Implantable Lead Model: 5076
Lead Channel Impedance Value: 817 Ohm
Lead Channel Setting Pacing Amplitude: 2 V
Lead Channel Setting Pacing Amplitude: 2.5 V
Lead Channel Setting Sensing Sensitivity: 5.6 mV
MDC IDC LEAD LOCATION: 753858
MDC IDC MSMT LEADCHNL RA IMPEDANCE VALUE: 419 Ohm
MDC IDC SESS DTM: 20170114181517
MDC IDC SET LEADCHNL RV PACING PULSEWIDTH: 0.4 ms
MDC IDC STAT BRADY AP VS PERCENT: 3 %
MDC IDC STAT BRADY AS VP PERCENT: 8 %
MDC IDC STAT BRADY AS VS PERCENT: 50 %

## 2015-04-15 ENCOUNTER — Encounter: Payer: Self-pay | Admitting: Cardiology

## 2015-04-26 DIAGNOSIS — R1013 Epigastric pain: Secondary | ICD-10-CM | POA: Diagnosis not present

## 2015-04-26 DIAGNOSIS — I129 Hypertensive chronic kidney disease with stage 1 through stage 4 chronic kidney disease, or unspecified chronic kidney disease: Secondary | ICD-10-CM | POA: Diagnosis not present

## 2015-04-27 ENCOUNTER — Other Ambulatory Visit: Payer: Self-pay | Admitting: Geriatric Medicine

## 2015-04-27 DIAGNOSIS — R1013 Epigastric pain: Secondary | ICD-10-CM

## 2015-04-28 ENCOUNTER — Other Ambulatory Visit: Payer: Medicare Other

## 2015-05-04 ENCOUNTER — Ambulatory Visit
Admission: RE | Admit: 2015-05-04 | Discharge: 2015-05-04 | Disposition: A | Discharge status: Home / Self Care | Payer: Medicare Other | Source: Ambulatory Visit | Attending: Geriatric Medicine | Admitting: Geriatric Medicine

## 2015-05-04 DIAGNOSIS — R1013 Epigastric pain: Secondary | ICD-10-CM

## 2015-05-04 DIAGNOSIS — N281 Cyst of kidney, acquired: Secondary | ICD-10-CM | POA: Diagnosis not present

## 2015-05-10 DIAGNOSIS — R1030 Lower abdominal pain, unspecified: Secondary | ICD-10-CM | POA: Diagnosis not present

## 2015-05-10 DIAGNOSIS — K8020 Calculus of gallbladder without cholecystitis without obstruction: Secondary | ICD-10-CM | POA: Diagnosis not present

## 2015-06-22 DIAGNOSIS — F325 Major depressive disorder, single episode, in full remission: Secondary | ICD-10-CM | POA: Diagnosis not present

## 2015-06-22 DIAGNOSIS — I129 Hypertensive chronic kidney disease with stage 1 through stage 4 chronic kidney disease, or unspecified chronic kidney disease: Secondary | ICD-10-CM | POA: Diagnosis not present

## 2015-06-22 DIAGNOSIS — N183 Chronic kidney disease, stage 3 (moderate): Secondary | ICD-10-CM | POA: Diagnosis not present

## 2015-06-22 DIAGNOSIS — I509 Heart failure, unspecified: Secondary | ICD-10-CM | POA: Diagnosis not present

## 2015-06-30 ENCOUNTER — Ambulatory Visit (INDEPENDENT_AMBULATORY_CARE_PROVIDER_SITE_OTHER): Payer: Medicare Other | Admitting: Internal Medicine

## 2015-06-30 ENCOUNTER — Encounter: Payer: Self-pay | Admitting: Internal Medicine

## 2015-06-30 VITALS — BP 128/76 | HR 94 | Ht 61.0 in | Wt 163.2 lb

## 2015-06-30 DIAGNOSIS — I495 Sick sinus syndrome: Secondary | ICD-10-CM | POA: Diagnosis not present

## 2015-06-30 LAB — CUP PACEART INCLINIC DEVICE CHECK
Battery Impedance: 326 Ohm
Battery Remaining Longevity: 95 mo
Battery Voltage: 2.78 V
Brady Statistic AP VP Percent: 39 %
Brady Statistic AP VS Percent: 2 %
Brady Statistic AS VP Percent: 9 %
Date Time Interrogation Session: 20170413132128
Implantable Lead Implant Date: 20071005
Implantable Lead Implant Date: 20071005
Implantable Lead Location: 753858
Implantable Lead Model: 4194
Implantable Lead Model: 5076
Lead Channel Impedance Value: 432 Ohm
Lead Channel Impedance Value: 786 Ohm
Lead Channel Pacing Threshold Amplitude: 0.75 V
Lead Channel Pacing Threshold Pulse Width: 0.4 ms
Lead Channel Pacing Threshold Pulse Width: 0.4 ms
Lead Channel Setting Pacing Amplitude: 2 V
Lead Channel Setting Pacing Amplitude: 2.5 V
MDC IDC LEAD LOCATION: 753859
MDC IDC MSMT LEADCHNL RA SENSING INTR AMPL: 2 mV
MDC IDC MSMT LEADCHNL RV PACING THRESHOLD AMPLITUDE: 1 V
MDC IDC MSMT LEADCHNL RV SENSING INTR AMPL: 22.4 mV
MDC IDC SET LEADCHNL RV PACING PULSEWIDTH: 0.4 ms
MDC IDC SET LEADCHNL RV SENSING SENSITIVITY: 5.6 mV
MDC IDC STAT BRADY AS VS PERCENT: 50 %

## 2015-06-30 NOTE — Progress Notes (Signed)
HPI [name] returns today for followup. She is a very pleasant 82 year old woman with a history of symptomatic bradycardia, status post permanent pacemaker insertion, coronary artery disease, hypertension, and arthritis.  No peripheral edema. She has chronic knee pain and weakness. She has moved into the assisted living section of [facility]. No chest pain or sob. Minimal peripheral edema. In the interim, she has been stable.  Allergies  Allergen Reactions  . Arthrotec [Diclofenac-Misoprostol]   . Ibuprofen   . Lactose Intolerance (Gi)   . Milk-Related Compounds Nausea And Vomiting  . Other     Grass and ragweed  . Oxycontin [Oxycodone Hcl]   . Pollen Extract      Current Outpatient Prescriptions  Medication Sig Dispense Refill  . acetaminophen (TYLENOL) 325 MG tablet Take 2 tablets (650 mg total) by mouth every 6 (six) hours as needed for mild pain (or Fever >/= 101). 60 tablet 2  . albuterol (PROVENTIL HFA;VENTOLIN HFA) 108 (90 BASE) MCG/ACT inhaler Inhale 1-2 puffs into the lungs every 6 (six) hours as needed for wheezing. 1 Inhaler 0  . alendronate (FOSAMAX) 35 MG tablet Take 35 mg by mouth every 7 (seven) days. Take with a full glass of water on an empty stomach.    Marland Kitchen aspirin EC 325 MG tablet Take 1 tablet (325 mg total) by mouth 2 (two) times daily. Take for 4 weeks 60 tablet 0  . carvedilol (COREG) 3.125 MG tablet Take 3.125 mg by mouth 2 (two) times daily with a meal.    . cyanocobalamin 1000 MCG tablet Take 100 mcg by mouth daily.    Marland Kitchen docusate sodium 100 MG CAPS Take 100 mg by mouth 2 (two) times daily. 10 capsule 0  . DULoxetine (CYMBALTA) 30 MG capsule Take 30 mg by mouth daily.      . febuxostat (ULORIC) 40 MG tablet Take 40 mg by mouth daily.    Marland Kitchen HYDROcodone-acetaminophen (NORCO/VICODIN) 5-325 MG per tablet Take 1-2 tablets by mouth every 6 (six) hours as needed for moderate pain. 30 tablet 0  . lansoprazole (PREVACID) 15 MG capsule Take 15 mg by mouth every morning.  For reflux.    . nitroGLYCERIN (NITROSTAT) 0.4 MG SL tablet Place 0.4 mg under the tongue every 5 (five) minutes as needed for chest pain.    . polyethylene glycol (MIRALAX / GLYCOLAX) packet Take 17 g by mouth daily.    . Polysaccharide Iron Complex (POLY-IRON 150 PO) Take 1 tablet by mouth daily.     Marland Kitchen torsemide (DEMADEX) 10 MG tablet Take 10 mg by mouth every other day.  98   No current facility-administered medications for this visit.     Past Medical History  Diagnosis Date  . Dyslipidemia   . Diverticulitis   . HTN (hypertension)   . Osteoporosis   . Spinal stenosis   . Chronic low back pain   . CAD (coronary artery disease)   . Pulmonary embolism (HCC)   . Nonischemic cardiomyopathy (HCC)   . Ulcer disease   . H/O: hysterectomy   . Right knee pain 05/15/12  . Contusion of foot, right 05/15/12  . Weight loss   . Memory disturbance 05/2012    MMSE 26/30, intact Clock test    ROS:   All systems reviewed and negative except as noted in the HPI.   Past Surgical History  Procedure Laterality Date  . Right shoulder repair  1985    Dr. [name]  . Left shoulder replacement  1991  Dr. [name]  . Appendectomy  2010  . Cataract extraction    . Colon resection  2005  . Back surgery    . Rotator cuff debridement  1999    Dr. [name]  . Coronary angioplasty with stent placement  01/2003  . Hemicolectomy    . Biv icd genertaor change out N/A 05/24/2011    Procedure: BIV ICD GENERTAOR CHANGE OUT;  Surgeon: [name], MD;  Location: [facility] CATH LAB;  Service: Cardiovascular;  Laterality: N/A;  . Hip arthroplasty Left 04/05/2014    Procedure: ARTHROPLASTY BIPOLAR HIP;  Surgeon: Shelda Pal, MD;  Location: [facility] ORS;  Service: Orthopedics;  Laterality: Left;     Family History  Problem Relation Age of Onset  . Coronary artery disease Sister   . Heart attack Father 68  . Heart attack Mother   . Other Mother     abdominal blockage     Social History   Social History  .  Marital Status: Widowed    Spouse Name: N/A  . Number of Children: N/A  . Years of Education: N/A   Occupational History  . Not on file.   Social History Main Topics  . Smoking status: Never Smoker   . Smokeless tobacco: Not on file  . Alcohol Use: No  . Drug Use: No  . Sexual Activity: Not on file   Other Topics Concern  . Not on file   Social History Narrative     BP 128/76 mmHg  Pulse 94  Ht 5\' 1"  (1.549 m)  Wt 163 lb 3.2 oz (74.027 kg)  BMI 30.85 kg/m2  Physical Exam:  Elderly but well appearing 82 year old woman,NAD HEENT: Unremarkable Neck:  7 cm JVD, no thyromegally Lungs:  Clear with no wheezes, rales, or rhonchi. HEART:  Regular rate rhythm, no murmurs, no rubs, no clicks Abd:  soft, positive bowel sounds, no organomegally, no rebound, no guarding Ext:  2 plus pulses, no peripheral edema, no cyanosis, no clubbing Skin:  No rashes no nodules Neuro:  CN II through XII intact, motor grossly intact  DEVICE  Normal device function.  See PaceArt for details.   Assess/Plan: 1. Sinus node dysfunction - she is asymptomatic, s/p PPM insertion. 2. HTN - her blood pressure is well controlled. Will recheck in several months. 3. PPM - her medtronic DDD PM is working normally. Will recheck in several months.  [name].D.

## 2015-06-30 NOTE — Patient Instructions (Signed)
Medication Instructions:  Your physician recommends that you continue on your current medications as directed. Please refer to the Current Medication list given to you today.   Labwork: None ordered   Testing/Procedures: None ordered   Follow-Up:  Your physician wants you to follow-up in: 12 months with Dr Taylor You will receive a reminder letter in the mail two months in advance. If you don't receive a letter, please call our office to schedule the follow-up appointment.   Remote monitoring is used to monitor your Pacemaker  from home. This monitoring reduces the number of office visits required to check your device to one time per year. It allows us to keep an eye on the functioning of your device to ensure it is working properly. You are scheduled for a device check from home on 09/29/15. You may send your transmission at any time that day. If you have a wireless device, the transmission will be sent automatically. After your physician reviews your transmission, you will receive a postcard with your next transmission date.    Any Other Special Instructions Will Be Listed Below (If Applicable).     If you need a refill on your cardiac medications before your next appointment, please call your pharmacy.   

## 2015-08-08 DIAGNOSIS — R1030 Lower abdominal pain, unspecified: Secondary | ICD-10-CM | POA: Diagnosis not present

## 2015-08-08 DIAGNOSIS — K8020 Calculus of gallbladder without cholecystitis without obstruction: Secondary | ICD-10-CM | POA: Diagnosis not present

## 2015-09-29 ENCOUNTER — Telehealth: Payer: Self-pay | Admitting: Cardiology

## 2015-09-29 ENCOUNTER — Encounter: Payer: Medicare Other | Admitting: *Deleted

## 2015-09-29 NOTE — Telephone Encounter (Signed)
LMOVM reminding pt to send remote transmission.   

## 2015-09-30 ENCOUNTER — Encounter: Payer: Self-pay | Admitting: Cardiology

## 2015-10-13 DIAGNOSIS — L89301 Pressure ulcer of unspecified buttock, stage 1: Secondary | ICD-10-CM | POA: Diagnosis not present

## 2015-10-13 DIAGNOSIS — I129 Hypertensive chronic kidney disease with stage 1 through stage 4 chronic kidney disease, or unspecified chronic kidney disease: Secondary | ICD-10-CM | POA: Diagnosis not present

## 2015-10-13 DIAGNOSIS — R3981 Functional urinary incontinence: Secondary | ICD-10-CM | POA: Diagnosis not present

## 2015-10-13 DIAGNOSIS — N183 Chronic kidney disease, stage 3 (moderate): Secondary | ICD-10-CM | POA: Diagnosis not present

## 2015-10-13 DIAGNOSIS — N390 Urinary tract infection, site not specified: Secondary | ICD-10-CM | POA: Diagnosis not present

## 2015-10-13 DIAGNOSIS — R319 Hematuria, unspecified: Secondary | ICD-10-CM | POA: Diagnosis not present

## 2015-10-13 DIAGNOSIS — Z79899 Other long term (current) drug therapy: Secondary | ICD-10-CM | POA: Diagnosis not present

## 2015-11-15 DIAGNOSIS — L602 Onychogryphosis: Secondary | ICD-10-CM | POA: Diagnosis not present

## 2015-11-15 DIAGNOSIS — H353132 Nonexudative age-related macular degeneration, bilateral, intermediate dry stage: Secondary | ICD-10-CM | POA: Diagnosis not present

## 2015-11-15 DIAGNOSIS — Z961 Presence of intraocular lens: Secondary | ICD-10-CM | POA: Diagnosis not present

## 2015-11-15 DIAGNOSIS — H04123 Dry eye syndrome of bilateral lacrimal glands: Secondary | ICD-10-CM | POA: Diagnosis not present

## 2015-11-15 DIAGNOSIS — M79672 Pain in left foot: Secondary | ICD-10-CM | POA: Diagnosis not present

## 2015-11-15 DIAGNOSIS — M79671 Pain in right foot: Secondary | ICD-10-CM | POA: Diagnosis not present

## 2015-11-15 DIAGNOSIS — H26491 Other secondary cataract, right eye: Secondary | ICD-10-CM | POA: Diagnosis not present

## 2015-11-15 DIAGNOSIS — L84 Corns and callosities: Secondary | ICD-10-CM | POA: Diagnosis not present

## 2015-11-15 DIAGNOSIS — H10413 Chronic giant papillary conjunctivitis, bilateral: Secondary | ICD-10-CM | POA: Diagnosis not present

## 2015-12-20 DIAGNOSIS — L602 Onychogryphosis: Secondary | ICD-10-CM | POA: Diagnosis not present

## 2015-12-20 DIAGNOSIS — M79671 Pain in right foot: Secondary | ICD-10-CM | POA: Diagnosis not present

## 2015-12-20 DIAGNOSIS — L84 Corns and callosities: Secondary | ICD-10-CM | POA: Diagnosis not present

## 2015-12-20 DIAGNOSIS — M79672 Pain in left foot: Secondary | ICD-10-CM | POA: Diagnosis not present

## 2015-12-21 DIAGNOSIS — Z23 Encounter for immunization: Secondary | ICD-10-CM | POA: Diagnosis not present

## 2015-12-21 DIAGNOSIS — N183 Chronic kidney disease, stage 3 (moderate): Secondary | ICD-10-CM | POA: Diagnosis not present

## 2015-12-21 DIAGNOSIS — Z79899 Other long term (current) drug therapy: Secondary | ICD-10-CM | POA: Diagnosis not present

## 2015-12-21 DIAGNOSIS — I509 Heart failure, unspecified: Secondary | ICD-10-CM | POA: Diagnosis not present

## 2015-12-21 DIAGNOSIS — I129 Hypertensive chronic kidney disease with stage 1 through stage 4 chronic kidney disease, or unspecified chronic kidney disease: Secondary | ICD-10-CM | POA: Diagnosis not present

## 2016-02-21 ENCOUNTER — Telehealth: Payer: Self-pay | Admitting: Internal Medicine

## 2016-02-21 NOTE — Telephone Encounter (Signed)
Called pts daughter back . She stated that pt was not SOB, denied LE edema. She stated that pt was up 5lbs in 10 days. Pts daughter stated that  [facility] was monitoring her oxygen level and as far as he knew her oxygen level was normal.  Informed pts daughter to call PCP, and would send her message  to Dr. Lubertha Basque nurse. Pts daughter voiced understanding.

## 2016-02-21 NOTE — Telephone Encounter (Signed)
New message       Daughter states that nurse at [facility] said [name] has been gaining 1/2 lb daily.  Should [name] see Dr Ladona Ridgel (because of pacemaker) or see PCP?  Please call.

## 2016-02-22 DIAGNOSIS — I129 Hypertensive chronic kidney disease with stage 1 through stage 4 chronic kidney disease, or unspecified chronic kidney disease: Secondary | ICD-10-CM | POA: Diagnosis not present

## 2016-02-22 DIAGNOSIS — R0602 Shortness of breath: Secondary | ICD-10-CM | POA: Diagnosis not present

## 2016-02-22 DIAGNOSIS — I509 Heart failure, unspecified: Secondary | ICD-10-CM | POA: Diagnosis not present

## 2016-02-22 DIAGNOSIS — N183 Chronic kidney disease, stage 3 (moderate): Secondary | ICD-10-CM | POA: Diagnosis not present

## 2016-02-23 ENCOUNTER — Telehealth: Payer: Self-pay

## 2016-02-23 NOTE — Telephone Encounter (Signed)
Dr. Ladona Ridgel reviewed note from 02/21/16. Pt should refer to PCP.

## 2016-02-25 DIAGNOSIS — M25572 Pain in left ankle and joints of left foot: Secondary | ICD-10-CM | POA: Diagnosis not present

## 2016-03-29 DIAGNOSIS — F325 Major depressive disorder, single episode, in full remission: Secondary | ICD-10-CM | POA: Diagnosis not present

## 2016-03-29 DIAGNOSIS — I509 Heart failure, unspecified: Secondary | ICD-10-CM | POA: Diagnosis not present

## 2016-03-29 DIAGNOSIS — N183 Chronic kidney disease, stage 3 (moderate): Secondary | ICD-10-CM | POA: Diagnosis not present

## 2016-03-29 DIAGNOSIS — Z79899 Other long term (current) drug therapy: Secondary | ICD-10-CM | POA: Diagnosis not present

## 2016-03-29 DIAGNOSIS — Z0000 Encounter for general adult medical examination without abnormal findings: Secondary | ICD-10-CM | POA: Diagnosis not present

## 2016-03-29 DIAGNOSIS — I129 Hypertensive chronic kidney disease with stage 1 through stage 4 chronic kidney disease, or unspecified chronic kidney disease: Secondary | ICD-10-CM | POA: Diagnosis not present

## 2016-03-29 DIAGNOSIS — Z23 Encounter for immunization: Secondary | ICD-10-CM | POA: Diagnosis not present

## 2016-03-29 DIAGNOSIS — K219 Gastro-esophageal reflux disease without esophagitis: Secondary | ICD-10-CM | POA: Diagnosis not present

## 2016-03-29 DIAGNOSIS — D7589 Other specified diseases of blood and blood-forming organs: Secondary | ICD-10-CM | POA: Diagnosis not present

## 2016-07-11 DIAGNOSIS — I509 Heart failure, unspecified: Secondary | ICD-10-CM | POA: Diagnosis not present

## 2016-07-11 DIAGNOSIS — I129 Hypertensive chronic kidney disease with stage 1 through stage 4 chronic kidney disease, or unspecified chronic kidney disease: Secondary | ICD-10-CM | POA: Diagnosis not present

## 2016-07-11 DIAGNOSIS — I872 Venous insufficiency (chronic) (peripheral): Secondary | ICD-10-CM | POA: Diagnosis not present

## 2016-07-11 DIAGNOSIS — N183 Chronic kidney disease, stage 3 (moderate): Secondary | ICD-10-CM | POA: Diagnosis not present

## 2016-07-27 DIAGNOSIS — I129 Hypertensive chronic kidney disease with stage 1 through stage 4 chronic kidney disease, or unspecified chronic kidney disease: Secondary | ICD-10-CM | POA: Diagnosis not present

## 2016-07-27 DIAGNOSIS — M25561 Pain in right knee: Secondary | ICD-10-CM | POA: Diagnosis not present

## 2016-07-27 DIAGNOSIS — N183 Chronic kidney disease, stage 3 (moderate): Secondary | ICD-10-CM | POA: Diagnosis not present

## 2016-07-27 DIAGNOSIS — I509 Heart failure, unspecified: Secondary | ICD-10-CM | POA: Diagnosis not present

## 2016-08-20 DIAGNOSIS — I129 Hypertensive chronic kidney disease with stage 1 through stage 4 chronic kidney disease, or unspecified chronic kidney disease: Secondary | ICD-10-CM | POA: Diagnosis not present

## 2016-08-20 DIAGNOSIS — N183 Chronic kidney disease, stage 3 (moderate): Secondary | ICD-10-CM | POA: Diagnosis not present

## 2016-08-20 DIAGNOSIS — I509 Heart failure, unspecified: Secondary | ICD-10-CM | POA: Diagnosis not present

## 2016-09-07 DIAGNOSIS — M62562 Muscle wasting and atrophy, not elsewhere classified, left lower leg: Secondary | ICD-10-CM | POA: Diagnosis not present

## 2016-09-07 DIAGNOSIS — R278 Other lack of coordination: Secondary | ICD-10-CM | POA: Diagnosis not present

## 2016-09-07 DIAGNOSIS — M62561 Muscle wasting and atrophy, not elsewhere classified, right lower leg: Secondary | ICD-10-CM | POA: Diagnosis not present

## 2016-09-07 DIAGNOSIS — R2689 Other abnormalities of gait and mobility: Secondary | ICD-10-CM | POA: Diagnosis not present

## 2016-09-12 DIAGNOSIS — M62562 Muscle wasting and atrophy, not elsewhere classified, left lower leg: Secondary | ICD-10-CM | POA: Diagnosis not present

## 2016-09-12 DIAGNOSIS — M62561 Muscle wasting and atrophy, not elsewhere classified, right lower leg: Secondary | ICD-10-CM | POA: Diagnosis not present

## 2016-09-12 DIAGNOSIS — R278 Other lack of coordination: Secondary | ICD-10-CM | POA: Diagnosis not present

## 2016-09-12 DIAGNOSIS — R2689 Other abnormalities of gait and mobility: Secondary | ICD-10-CM | POA: Diagnosis not present

## 2016-09-13 DIAGNOSIS — R2689 Other abnormalities of gait and mobility: Secondary | ICD-10-CM | POA: Diagnosis not present

## 2016-09-13 DIAGNOSIS — M62562 Muscle wasting and atrophy, not elsewhere classified, left lower leg: Secondary | ICD-10-CM | POA: Diagnosis not present

## 2016-09-13 DIAGNOSIS — R278 Other lack of coordination: Secondary | ICD-10-CM | POA: Diagnosis not present

## 2016-09-13 DIAGNOSIS — M62561 Muscle wasting and atrophy, not elsewhere classified, right lower leg: Secondary | ICD-10-CM | POA: Diagnosis not present

## 2016-09-14 DIAGNOSIS — R2689 Other abnormalities of gait and mobility: Secondary | ICD-10-CM | POA: Diagnosis not present

## 2016-09-14 DIAGNOSIS — M62562 Muscle wasting and atrophy, not elsewhere classified, left lower leg: Secondary | ICD-10-CM | POA: Diagnosis not present

## 2016-09-14 DIAGNOSIS — M62561 Muscle wasting and atrophy, not elsewhere classified, right lower leg: Secondary | ICD-10-CM | POA: Diagnosis not present

## 2016-09-14 DIAGNOSIS — R278 Other lack of coordination: Secondary | ICD-10-CM | POA: Diagnosis not present

## 2016-09-17 DIAGNOSIS — M62561 Muscle wasting and atrophy, not elsewhere classified, right lower leg: Secondary | ICD-10-CM | POA: Diagnosis not present

## 2016-09-17 DIAGNOSIS — M62562 Muscle wasting and atrophy, not elsewhere classified, left lower leg: Secondary | ICD-10-CM | POA: Diagnosis not present

## 2016-09-17 DIAGNOSIS — R2689 Other abnormalities of gait and mobility: Secondary | ICD-10-CM | POA: Diagnosis not present

## 2016-09-17 DIAGNOSIS — R278 Other lack of coordination: Secondary | ICD-10-CM | POA: Diagnosis not present

## 2016-09-18 DIAGNOSIS — R2689 Other abnormalities of gait and mobility: Secondary | ICD-10-CM | POA: Diagnosis not present

## 2016-09-18 DIAGNOSIS — R278 Other lack of coordination: Secondary | ICD-10-CM | POA: Diagnosis not present

## 2016-09-18 DIAGNOSIS — M62562 Muscle wasting and atrophy, not elsewhere classified, left lower leg: Secondary | ICD-10-CM | POA: Diagnosis not present

## 2016-09-18 DIAGNOSIS — M62561 Muscle wasting and atrophy, not elsewhere classified, right lower leg: Secondary | ICD-10-CM | POA: Diagnosis not present

## 2016-09-25 DIAGNOSIS — R2689 Other abnormalities of gait and mobility: Secondary | ICD-10-CM | POA: Diagnosis not present

## 2016-09-25 DIAGNOSIS — M62561 Muscle wasting and atrophy, not elsewhere classified, right lower leg: Secondary | ICD-10-CM | POA: Diagnosis not present

## 2016-09-25 DIAGNOSIS — M62562 Muscle wasting and atrophy, not elsewhere classified, left lower leg: Secondary | ICD-10-CM | POA: Diagnosis not present

## 2016-09-25 DIAGNOSIS — R278 Other lack of coordination: Secondary | ICD-10-CM | POA: Diagnosis not present

## 2016-09-29 ENCOUNTER — Encounter (HOSPITAL_COMMUNITY): Payer: Self-pay | Admitting: Emergency Medicine

## 2016-09-29 ENCOUNTER — Emergency Department (HOSPITAL_COMMUNITY): Payer: Medicare Other

## 2016-09-29 ENCOUNTER — Inpatient Hospital Stay (HOSPITAL_COMMUNITY)
Admission: EM | Admit: 2016-09-29 | Discharge: 2016-10-04 | DRG: 481 | Disposition: A | Discharge status: Skilled Nursing Facility | Payer: Medicare Other | Attending: Internal Medicine | Admitting: Internal Medicine

## 2016-09-29 DIAGNOSIS — Z96612 Presence of left artificial shoulder joint: Secondary | ICD-10-CM | POA: Diagnosis present

## 2016-09-29 DIAGNOSIS — I2583 Coronary atherosclerosis due to lipid rich plaque: Secondary | ICD-10-CM

## 2016-09-29 DIAGNOSIS — S199XXA Unspecified injury of neck, initial encounter: Secondary | ICD-10-CM | POA: Diagnosis not present

## 2016-09-29 DIAGNOSIS — I110 Hypertensive heart disease with heart failure: Secondary | ICD-10-CM | POA: Diagnosis not present

## 2016-09-29 DIAGNOSIS — Z7983 Long term (current) use of bisphosphonates: Secondary | ICD-10-CM | POA: Diagnosis not present

## 2016-09-29 DIAGNOSIS — I428 Other cardiomyopathies: Secondary | ICD-10-CM | POA: Diagnosis not present

## 2016-09-29 DIAGNOSIS — E785 Hyperlipidemia, unspecified: Secondary | ICD-10-CM | POA: Diagnosis not present

## 2016-09-29 DIAGNOSIS — I5023 Acute on chronic systolic (congestive) heart failure: Secondary | ICD-10-CM

## 2016-09-29 DIAGNOSIS — Z96659 Presence of unspecified artificial knee joint: Secondary | ICD-10-CM

## 2016-09-29 DIAGNOSIS — M978XXA Periprosthetic fracture around other internal prosthetic joint, initial encounter: Secondary | ICD-10-CM

## 2016-09-29 DIAGNOSIS — S728X1A Other fracture of right femur, initial encounter for closed fracture: Secondary | ICD-10-CM | POA: Diagnosis not present

## 2016-09-29 DIAGNOSIS — S72452A Displaced supracondylar fracture without intracondylar extension of lower end of left femur, initial encounter for closed fracture: Principal | ICD-10-CM | POA: Diagnosis present

## 2016-09-29 DIAGNOSIS — E739 Lactose intolerance, unspecified: Secondary | ICD-10-CM | POA: Diagnosis present

## 2016-09-29 DIAGNOSIS — I2510 Atherosclerotic heart disease of native coronary artery without angina pectoris: Secondary | ICD-10-CM | POA: Diagnosis present

## 2016-09-29 DIAGNOSIS — M9712XA Periprosthetic fracture around internal prosthetic left knee joint, initial encounter: Secondary | ICD-10-CM | POA: Diagnosis present

## 2016-09-29 DIAGNOSIS — Z91048 Other nonmedicinal substance allergy status: Secondary | ICD-10-CM

## 2016-09-29 DIAGNOSIS — M810 Age-related osteoporosis without current pathological fracture: Secondary | ICD-10-CM | POA: Diagnosis present

## 2016-09-29 DIAGNOSIS — Z886 Allergy status to analgesic agent status: Secondary | ICD-10-CM

## 2016-09-29 DIAGNOSIS — I5022 Chronic systolic (congestive) heart failure: Secondary | ICD-10-CM | POA: Diagnosis present

## 2016-09-29 DIAGNOSIS — Z96642 Presence of left artificial hip joint: Secondary | ICD-10-CM | POA: Diagnosis not present

## 2016-09-29 DIAGNOSIS — W19XXXA Unspecified fall, initial encounter: Secondary | ICD-10-CM

## 2016-09-29 DIAGNOSIS — F0390 Unspecified dementia without behavioral disturbance: Secondary | ICD-10-CM | POA: Diagnosis not present

## 2016-09-29 DIAGNOSIS — Z885 Allergy status to narcotic agent status: Secondary | ICD-10-CM | POA: Diagnosis not present

## 2016-09-29 DIAGNOSIS — S72402A Unspecified fracture of lower end of left femur, initial encounter for closed fracture: Secondary | ICD-10-CM | POA: Diagnosis not present

## 2016-09-29 DIAGNOSIS — R001 Bradycardia, unspecified: Secondary | ICD-10-CM | POA: Diagnosis not present

## 2016-09-29 DIAGNOSIS — Z8249 Family history of ischemic heart disease and other diseases of the circulatory system: Secondary | ICD-10-CM | POA: Diagnosis not present

## 2016-09-29 DIAGNOSIS — Z7982 Long term (current) use of aspirin: Secondary | ICD-10-CM

## 2016-09-29 DIAGNOSIS — S72492A Other fracture of lower end of left femur, initial encounter for closed fracture: Secondary | ICD-10-CM | POA: Diagnosis not present

## 2016-09-29 DIAGNOSIS — N179 Acute kidney failure, unspecified: Secondary | ICD-10-CM | POA: Diagnosis not present

## 2016-09-29 DIAGNOSIS — D62 Acute posthemorrhagic anemia: Secondary | ICD-10-CM | POA: Diagnosis not present

## 2016-09-29 DIAGNOSIS — S0990XA Unspecified injury of head, initial encounter: Secondary | ICD-10-CM | POA: Diagnosis not present

## 2016-09-29 DIAGNOSIS — M4800 Spinal stenosis, site unspecified: Secondary | ICD-10-CM | POA: Diagnosis present

## 2016-09-29 DIAGNOSIS — Z90710 Acquired absence of both cervix and uterus: Secondary | ICD-10-CM | POA: Diagnosis not present

## 2016-09-29 DIAGNOSIS — Z955 Presence of coronary angioplasty implant and graft: Secondary | ICD-10-CM

## 2016-09-29 DIAGNOSIS — Z9849 Cataract extraction status, unspecified eye: Secondary | ICD-10-CM

## 2016-09-29 DIAGNOSIS — Z86711 Personal history of pulmonary embolism: Secondary | ICD-10-CM

## 2016-09-29 DIAGNOSIS — M545 Low back pain: Secondary | ICD-10-CM | POA: Diagnosis present

## 2016-09-29 DIAGNOSIS — D649 Anemia, unspecified: Secondary | ICD-10-CM | POA: Diagnosis not present

## 2016-09-29 DIAGNOSIS — S72009A Fracture of unspecified part of neck of unspecified femur, initial encounter for closed fracture: Secondary | ICD-10-CM | POA: Diagnosis present

## 2016-09-29 DIAGNOSIS — D72829 Elevated white blood cell count, unspecified: Secondary | ICD-10-CM | POA: Diagnosis present

## 2016-09-29 DIAGNOSIS — R4182 Altered mental status, unspecified: Secondary | ICD-10-CM | POA: Diagnosis not present

## 2016-09-29 DIAGNOSIS — R9431 Abnormal electrocardiogram [ECG] [EKG]: Secondary | ICD-10-CM | POA: Diagnosis not present

## 2016-09-29 DIAGNOSIS — Y92129 Unspecified place in nursing home as the place of occurrence of the external cause: Secondary | ICD-10-CM

## 2016-09-29 DIAGNOSIS — G8929 Other chronic pain: Secondary | ICD-10-CM | POA: Diagnosis present

## 2016-09-29 DIAGNOSIS — Z95810 Presence of automatic (implantable) cardiac defibrillator: Secondary | ICD-10-CM

## 2016-09-29 DIAGNOSIS — Z888 Allergy status to other drugs, medicaments and biological substances status: Secondary | ICD-10-CM

## 2016-09-29 DIAGNOSIS — D696 Thrombocytopenia, unspecified: Secondary | ICD-10-CM | POA: Diagnosis present

## 2016-09-29 DIAGNOSIS — Z09 Encounter for follow-up examination after completed treatment for conditions other than malignant neoplasm: Secondary | ICD-10-CM

## 2016-09-29 DIAGNOSIS — Z96643 Presence of artificial hip joint, bilateral: Secondary | ICD-10-CM | POA: Diagnosis not present

## 2016-09-29 DIAGNOSIS — Z96652 Presence of left artificial knee joint: Secondary | ICD-10-CM | POA: Diagnosis not present

## 2016-09-29 DIAGNOSIS — M79652 Pain in left thigh: Secondary | ICD-10-CM | POA: Diagnosis not present

## 2016-09-29 DIAGNOSIS — T148XXA Other injury of unspecified body region, initial encounter: Secondary | ICD-10-CM | POA: Diagnosis not present

## 2016-09-29 DIAGNOSIS — S8990XA Unspecified injury of unspecified lower leg, initial encounter: Secondary | ICD-10-CM | POA: Diagnosis not present

## 2016-09-29 DIAGNOSIS — Z66 Do not resuscitate: Secondary | ICD-10-CM | POA: Diagnosis present

## 2016-09-29 DIAGNOSIS — F329 Major depressive disorder, single episode, unspecified: Secondary | ICD-10-CM | POA: Diagnosis present

## 2016-09-29 DIAGNOSIS — Z471 Aftercare following joint replacement surgery: Secondary | ICD-10-CM | POA: Diagnosis not present

## 2016-09-29 LAB — CBC WITH DIFFERENTIAL/PLATELET
BASOS ABS: 0.1 10*3/uL (ref 0.0–0.1)
Basophils Relative: 0 %
Eosinophils Absolute: 0.1 10*3/uL (ref 0.0–0.7)
Eosinophils Relative: 1 %
HEMATOCRIT: 36.5 % (ref 36.0–46.0)
Hemoglobin: 12.5 g/dL (ref 12.0–15.0)
LYMPHS PCT: 17 %
Lymphs Abs: 2 10*3/uL (ref 0.7–4.0)
MCH: 34.8 pg — ABNORMAL HIGH (ref 26.0–34.0)
MCHC: 34.2 g/dL (ref 30.0–36.0)
MCV: 101.7 fL — AB (ref 78.0–100.0)
Monocytes Absolute: 0.8 10*3/uL (ref 0.1–1.0)
Monocytes Relative: 7 %
NEUTROS ABS: 8.9 10*3/uL — AB (ref 1.7–7.7)
NEUTROS PCT: 75 %
Platelets: 203 10*3/uL (ref 150–400)
RBC: 3.59 MIL/uL — AB (ref 3.87–5.11)
RDW: 14.6 % (ref 11.5–15.5)
WBC: 11.8 10*3/uL — AB (ref 4.0–10.5)

## 2016-09-29 LAB — BASIC METABOLIC PANEL
ANION GAP: 10 (ref 5–15)
BUN: 28 mg/dL — ABNORMAL HIGH (ref 6–20)
CO2: 28 mmol/L (ref 22–32)
Calcium: 9 mg/dL (ref 8.9–10.3)
Chloride: 103 mmol/L (ref 101–111)
Creatinine, Ser: 1.08 mg/dL — ABNORMAL HIGH (ref 0.44–1.00)
GFR calc Af Amer: 49 mL/min — ABNORMAL LOW (ref >60)
GFR calc non Af Amer: 43 mL/min — ABNORMAL LOW (ref >60)
GLUCOSE: 115 mg/dL — AB (ref 65–99)
Potassium: 4.4 mmol/L (ref 3.5–5.1)
Sodium: 141 mmol/L (ref 135–145)

## 2016-09-29 MED ORDER — MORPHINE SULFATE (PF) 2 MG/ML IV SOLN
2.0000 mg | Freq: Once | INTRAVENOUS | Status: AC
Start: 2016-09-29 — End: 2016-09-29
  Administered 2016-09-29: 2 mg via INTRAVENOUS
  Filled 2016-09-29: qty 1

## 2016-09-29 MED ORDER — SERTRALINE HCL 25 MG PO TABS
25.0000 mg | ORAL_TABLET | Freq: Every day | ORAL | Status: DC
  Administered 2016-10-01 – 2016-10-04 (×4): 25 mg via ORAL
  Filled 2016-09-29 (×4): qty 1

## 2016-09-29 MED ORDER — ONDANSETRON HCL 4 MG/2ML IJ SOLN: 4.0000 mg | Freq: Four times a day (QID) | INTRAMUSCULAR | Status: DC | PRN

## 2016-09-29 MED ORDER — POLYSACCHARIDE IRON COMPLEX 150 MG PO CAPS: 150.0000 mg | ORAL_CAPSULE | Freq: Every day | ORAL | Status: DC

## 2016-09-29 MED ORDER — POLYETHYLENE GLYCOL 3350 17 G PO PACK
17.0000 g | PACK | Freq: Every day | ORAL | Status: DC
  Administered 2016-10-01 – 2016-10-03 (×3): 17 g via ORAL
  Filled 2016-09-29 (×3): qty 1

## 2016-09-29 MED ORDER — ALLOPURINOL 100 MG PO TABS
100.0000 mg | ORAL_TABLET | Freq: Every day | ORAL | Status: DC
  Filled 2016-09-29: qty 1

## 2016-09-29 MED ORDER — ONDANSETRON HCL 4 MG PO TABS: 4.0000 mg | ORAL_TABLET | Freq: Four times a day (QID) | ORAL | Status: DC | PRN

## 2016-09-29 MED ORDER — PANTOPRAZOLE SODIUM 40 MG PO TBEC: 40.0000 mg | DELAYED_RELEASE_TABLET | Freq: Every day | ORAL | Status: DC

## 2016-09-29 MED ORDER — DEXTROSE-NACL 5-0.9 % IV SOLN
INTRAVENOUS | Status: DC
  Administered 2016-09-29: 18:00:00 via INTRAVENOUS

## 2016-09-29 MED ORDER — FAMOTIDINE 20 MG PO TABS
20.0000 mg | ORAL_TABLET | Freq: Every day | ORAL | Status: DC
Start: 2016-09-29 — End: 2016-10-04
  Administered 2016-09-29 – 2016-10-03 (×5): 20 mg via ORAL
  Filled 2016-09-29 (×5): qty 1

## 2016-09-29 MED ORDER — ALBUTEROL SULFATE (2.5 MG/3ML) 0.083% IN NEBU: 2.5000 mg | INHALATION_SOLUTION | Freq: Four times a day (QID) | RESPIRATORY_TRACT | Status: DC | PRN

## 2016-09-29 MED ORDER — HYDROCODONE-ACETAMINOPHEN 5-325 MG PO TABS
1.0000 | ORAL_TABLET | ORAL | Status: DC | PRN
  Administered 2016-09-29 – 2016-10-03 (×6): 1 via ORAL
  Filled 2016-09-29 (×6): qty 1

## 2016-09-29 MED ORDER — SODIUM CHLORIDE 0.9 % IV SOLN
INTRAVENOUS | Status: DC
  Administered 2016-09-30: via INTRAVENOUS

## 2016-09-29 MED ORDER — VITAMIN B-12 1000 MCG PO TABS
1000.0000 ug | ORAL_TABLET | Freq: Every day | ORAL | Status: DC
Start: 2016-09-30 — End: 2016-10-04
  Administered 2016-10-01 – 2016-10-04 (×4): 1000 ug via ORAL
  Filled 2016-09-29 (×4): qty 1

## 2016-09-29 MED ORDER — FEBUXOSTAT 40 MG PO TABS: 40.0000 mg | ORAL_TABLET | Freq: Every day | ORAL | Status: DC

## 2016-09-29 MED ORDER — MORPHINE SULFATE (PF) 4 MG/ML IV SOLN
1.0000 mg | INTRAVENOUS | Status: DC | PRN
  Administered 2016-09-29 – 2016-10-01 (×3): 1 mg via INTRAVENOUS
  Filled 2016-09-29 (×3): qty 1

## 2016-09-29 MED ORDER — ACETAMINOPHEN 325 MG PO TABS: 650.0000 mg | ORAL_TABLET | Freq: Four times a day (QID) | ORAL | Status: DC | PRN

## 2016-09-29 MED ORDER — ASPIRIN EC 325 MG PO TBEC: 325.0000 mg | DELAYED_RELEASE_TABLET | Freq: Two times a day (BID) | ORAL | Status: DC

## 2016-09-29 MED ORDER — DULOXETINE HCL 30 MG PO CPEP: 30.0000 mg | ORAL_CAPSULE | Freq: Every day | ORAL | Status: DC

## 2016-09-29 MED ORDER — NITROGLYCERIN 0.4 MG SL SUBL: 0.4000 mg | SUBLINGUAL_TABLET | SUBLINGUAL | Status: DC | PRN

## 2016-09-29 MED ORDER — MORPHINE SULFATE (PF) 4 MG/ML IV SOLN
4.0000 mg | Freq: Once | INTRAVENOUS | Status: AC
  Administered 2016-09-29: 4 mg via INTRAVENOUS
  Filled 2016-09-29: qty 1

## 2016-09-29 MED ORDER — FENTANYL CITRATE (PF) 100 MCG/2ML IJ SOLN
25.0000 ug | Freq: Once | INTRAMUSCULAR | Status: AC
  Administered 2016-09-29: 25 ug via INTRAVENOUS
  Filled 2016-09-29: qty 2

## 2016-09-29 MED ORDER — DOCUSATE SODIUM 50 MG/5ML PO LIQD
100.0000 mg | Freq: Two times a day (BID) | ORAL | Status: DC
  Administered 2016-09-29 – 2016-10-01 (×3): 100 mg via ORAL
  Filled 2016-09-29 (×6): qty 10

## 2016-09-29 MED ORDER — CARVEDILOL 3.125 MG PO TABS
3.1250 mg | ORAL_TABLET | Freq: Two times a day (BID) | ORAL | Status: DC
  Administered 2016-09-29 – 2016-10-04 (×8): 3.125 mg via ORAL
  Filled 2016-09-29 (×8): qty 1

## 2016-09-29 MED ORDER — CEFAZOLIN SODIUM-DEXTROSE 2-4 GM/100ML-% IV SOLN
2.0000 g | Freq: Once | INTRAVENOUS | Status: AC
Start: 2016-09-30 — End: 2016-09-30
  Administered 2016-09-30: 2 g via INTRAVENOUS

## 2016-09-29 MED ORDER — ENOXAPARIN SODIUM 40 MG/0.4ML ~~LOC~~ SOLN
40.0000 mg | SUBCUTANEOUS | Status: DC
  Administered 2016-09-29: 40 mg via SUBCUTANEOUS
  Filled 2016-09-29: qty 0.4

## 2016-09-29 NOTE — ED Provider Notes (Signed)
Medical screening examination/treatment/procedure(s) were conducted as a shared visit with non-physician practitioner(s) and myself.  I personally evaluated the patient during the encounter.   EKG Interpretation None     Patient is walking in the hall at [facility] when she fell. Patient doesn't know why she fell or how. Patient has severe left leg pain. No other areas of pain complaints or injury. Patient is alert and appropriately interactive. No respiratory distress. At this point she has had medications for pain. Patient however still appears to be in pain and is moaning slightly. Heart and lungs normal. Patient denies any pain with palpation of cervical spine. She denies pain with compression of chest wall or abdomen. Left extremity is shortened and external rotated. Agree with plan and management.   Arby Barrette, MD 09/29/16 [phone]

## 2016-09-29 NOTE — Consult Note (Signed)
Reason for Consult:Left periprosthetic femur fracture Referring Physician: Dr. Bufford Spikes is an 83 y.o. female.  HPI: 83 yo female who had a mechanical fall today landing on her left side. Does not recall circumstances associated with the fall. Complains of left thigh pain, severe in nature. No other pain complaints. No numbness or tingling reported in leg or foot. Previous history of left hip hemiarthroplasty and left TKA. We are being consulted for fracture management  Past Medical History:  Diagnosis Date  . CAD (coronary artery disease)   . Chronic low back pain   . Contusion of foot, right 05/15/12  . Diverticulitis   . Dyslipidemia   . H/O: hysterectomy   . HTN (hypertension)   . Memory disturbance 05/2012   MMSE 26/30, intact Clock test  . Nonischemic cardiomyopathy ([place])   . Osteoporosis   . Pulmonary embolism ([place])   . Right knee pain 05/15/12  . Spinal stenosis   . Ulcer disease   . Weight loss     Past Surgical History:  Procedure Laterality Date  . APPENDECTOMY  2010  . BACK SURGERY    . BIV ICD GENERTAOR CHANGE OUT N/A 05/24/2011   Procedure: BIV ICD GENERTAOR CHANGE OUT;  Surgeon: Evans Lance, MD;  Location: [facility] CATH LAB;  Service: Cardiovascular;  Laterality: N/A;  . CATARACT EXTRACTION    . COLON RESECTION  2005  . CORONARY ANGIOPLASTY WITH STENT PLACEMENT  01/2003  . HEMICOLECTOMY    . HIP ARTHROPLASTY Left 04/05/2014   Procedure: ARTHROPLASTY BIPOLAR HIP;  Surgeon: Mauri Pole, MD;  Location: [facility] ORS;  Service: Orthopedics;  Laterality: Left;  . left shoulder replacement  1991   Dr. Collie Siad  . right shoulder repair  1985   Dr. Collie Siad  . rotator cuff debridement  1999   Dr. Maureen Ralphs    Family History  Problem Relation Age of Onset  . Coronary artery disease Sister   . Heart attack Father 10  . Heart attack Mother   . Other Mother        abdominal blockage    Social History:  reports that she has never smoked. She has never used smokeless  tobacco. She reports that she does not drink alcohol or use drugs.  Allergies:  Allergies  Allergen Reactions  . Arthrotec [Diclofenac-Misoprostol]   . Ibuprofen   . Lactose Intolerance (Gi)   . Milk-Related Compounds Nausea And Vomiting    Cheese  . Other     Grass and ragweed   . Oxycontin [Oxycodone Hcl]   . Pollen Extract     Medications: I have reviewed the patient's current medications.  Results for orders placed or performed during the hospital encounter of 09/29/16 (from the past 48 hour(s))  CBC with Differential     Status: Abnormal   Collection Time: 09/29/16  3:21 PM  Result Value Ref Range   WBC 11.8 (H) 4.0 - 10.5 K/uL   RBC 3.59 (L) 3.87 - 5.11 MIL/uL   Hemoglobin 12.5 12.0 - 15.0 g/dL   HCT 36.5 36.0 - 46.0 %   MCV 101.7 (H) 78.0 - 100.0 fL   MCH 34.8 (H) 26.0 - 34.0 pg   MCHC 34.2 30.0 - 36.0 g/dL   RDW 14.6 11.5 - 15.5 %   Platelets 203 150 - 400 K/uL   Neutrophils Relative % 75 %   Neutro Abs 8.9 (H) 1.7 - 7.7 K/uL   Lymphocytes Relative 17 %   Lymphs Abs  2.0 0.7 - 4.0 K/uL   Monocytes Relative 7 %   Monocytes Absolute 0.8 0.1 - 1.0 K/uL   Eosinophils Relative 1 %   Eosinophils Absolute 0.1 0.0 - 0.7 K/uL   Basophils Relative 0 %   Basophils Absolute 0.1 0.0 - 0.1 K/uL  Basic metabolic panel     Status: Abnormal   Collection Time: 09/29/16  3:21 PM  Result Value Ref Range   Sodium 141 135 - 145 mmol/L   Potassium 4.4 3.5 - 5.1 mmol/L   Chloride 103 101 - 111 mmol/L   CO2 28 22 - 32 mmol/L   Glucose, Bld 115 (H) 65 - 99 mg/dL   BUN 28 (H) 6 - 20 mg/dL   Creatinine, Ser 1.08 (H) 0.44 - 1.00 mg/dL   Calcium 9.0 8.9 - 10.3 mg/dL   GFR calc non Af Amer 43 (L) >60 mL/min   GFR calc Af Amer 49 (L) >60 mL/min    Comment: (NOTE) The eGFR has been calculated using the CKD EPI equation. This calculation has not been validated in all clinical situations. eGFR's persistently <60 mL/min signify possible Chronic Kidney Disease.    Anion gap 10 5 - 15     Dg Pelvis 1-2 Views  Result Date: 09/29/2016 CLINICAL DATA:  Left thigh pain following a fall today. EXAM: PELVIS - 1-2 VIEW COMPARISON:  04/05/2014. FINDINGS: Stable left hip bipolar prosthesis. Old, healed bilateral pubic fractures. No acute fracture or dislocation. 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 Lower lumbar spine degenerative changes. IMPRESSION: 1. No acute fracture or dislocation. 2. 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 Electronically Signed   By: Claudie Revering M.D.   On: 09/29/2016 15:00   Dg Tibia/fibula Left  Result Date: 09/29/2016 CLINICAL DATA:  Left leg pain following a fall today. EXAM: LEFT TIBIA AND FIBULA - 2 VIEW COMPARISON:  None. FINDINGS: Left total knee prosthesis. No fracture or dislocation seen. The images at the level of the ankle are suboptimally positioned due to a femur fracture. Tarsal degenerative changes are noted in the foot. IMPRESSION: No lower leg fracture or dislocation seen. Electronically Signed   By: Claudie Revering M.D.   On: 09/29/2016 15:04   Ct Head Wo Contrast  Result Date: 09/29/2016 CLINICAL DATA:  [place] [facility], denies loss of consciousness, history hypertension, non ischemic cardiomyopathy, coronary artery disease EXAM: CT HEAD WITHOUT CONTRAST CT CERVICAL SPINE WITHOUT CONTRAST TECHNIQUE: Multidetector CT imaging of the head and cervical spine was performed following the standard protocol without intravenous contrast. Multiplanar CT image reconstructions of the cervical spine were also generated. COMPARISON:  04/04/2014 FINDINGS: CT HEAD FINDINGS Brain: Generalized atrophy. Normal ventricular morphology. No midline shift or mass effect. Small vessel chronic ischemic changes of deep cerebral white matter. No intracranial hemorrhage, mass lesion, evidence of acute infarction, or extra-axial fluid collection. Vascular: Atherosclerotic calcifications of internal carotid arteries at skullbase Skull:  Osseous demineralization.  Calvaria intact. Sinuses/Orbits: Chronic opacification of portions of the RIGHT mastoid air cells. Remaining sinuses and LEFT mastoid air cells clear Other: N/A CT CERVICAL SPINE FINDINGS Alignment: Normal Skull base and vertebrae: Osseous demineralization. Visualized skullbase intact. Vertebral body heights maintained without fracture or bone destruction. Multilevel facet degenerative changes bilaterally. Ankylosis of C2-C3 facet joints. Soft tissues and spinal canal: Prevertebral soft tissues normal thickness. Atherosclerotic calcifications at the carotid bifurcations. Disc levels: Mild scattered disc space narrowing. Spinal canal patent. Beam hardening artifacts from patient shoulders. Upper chest: Visualized lung apices clear.  Other: N/A IMPRESSION: Atrophy with small vessel chronic ischemic changes of deep cerebral white matter. No acute intracranial abnormalities. Degenerative disc and facet disease changes cervical spine. No acute cervical spine abnormalities. Electronically Signed   By: Lavonia Dana M.D.   On: 09/29/2016 14:34   Ct Cervical Spine Wo Contrast  Result Date: 09/29/2016 CLINICAL DATA:  [place] [facility] at [facility] community, denies loss of consciousness, history hypertension, non ischemic cardiomyopathy, coronary artery disease EXAM: CT HEAD WITHOUT CONTRAST CT CERVICAL SPINE WITHOUT CONTRAST TECHNIQUE: Multidetector CT imaging of the head and cervical spine was performed following the standard protocol without intravenous contrast. Multiplanar CT image reconstructions of the cervical spine were also generated. COMPARISON:  04/04/2014 FINDINGS: CT HEAD FINDINGS Brain: Generalized atrophy. Normal ventricular morphology. No midline shift or mass effect. Small vessel chronic ischemic changes of deep cerebral white matter. No intracranial hemorrhage, mass lesion, evidence of acute infarction, or extra-axial fluid collection. Vascular: Atherosclerotic calcifications of  internal carotid arteries at skullbase Skull: Osseous demineralization.  Calvaria intact. Sinuses/Orbits: Chronic opacification of portions of the RIGHT mastoid air cells. Remaining sinuses and LEFT mastoid air cells clear Other: N/A CT CERVICAL SPINE FINDINGS Alignment: Normal Skull base and vertebrae: Osseous demineralization. Visualized skullbase intact. Vertebral body heights maintained without fracture or bone destruction. Multilevel facet degenerative changes bilaterally. Ankylosis of C2-C3 facet joints. Soft tissues and spinal canal: Prevertebral soft tissues normal thickness. Atherosclerotic calcifications at the carotid bifurcations. Disc levels: Mild scattered disc space narrowing. Spinal canal patent. Beam hardening artifacts from patient shoulders. Upper chest: Visualized lung apices clear. Other: N/A IMPRESSION: Atrophy with small vessel chronic ischemic changes of deep cerebral white matter. No acute intracranial abnormalities. Degenerative disc and facet disease changes cervical spine. No acute cervical spine abnormalities. Electronically Signed   By: Lavonia Dana M.D.   On: 09/29/2016 14:34   Dg Femur Min 2 Views Left  Result Date: 09/29/2016 CLINICAL DATA:  Left thigh pain following a fall today. EXAM: LEFT FEMUR 2 VIEWS COMPARISON:  None. FINDINGS: Oblique fracture of the distal femoral shaft with almost 1 shaft width of medial displacement and medial angulation of the distal fragment. There is also posterior angulation of the distal fragment. The fracture is mildly comminuted with 4 cm of overlapping of the fragments. Left knee and left hip prostheses are noted as well as atheromatous arterial calcifications. IMPRESSION: Distal femoral shaft fracture, as described above. Electronically Signed   By: Claudie Revering M.D.   On: 09/29/2016 15:03    ROS Blood pressure (!) 148/79, pulse 84, temperature 98.5 F (36.9 C), temperature source Oral, resp. rate 16, height 5' 2"  (1.575 m), weight 72.6 kg  (160 lb), SpO2 100 %. Physical Exam Physical Examination: General appearance - no distress, cooperative, alert, ill appearing Mental status - alert, oriented to person, place. LLE shortened without rotational deformity. EHL/FHL/TA/gastroc intact; sensation intact; foot warm and pink; pulses trace bilaterally   Assessment/Plan: Left distal femur periprosthetic fracture- This is a very complex injury in a medically complex patient. The fracture is a long spiral between a total knee and a hip stem. It is not amenable to an IM nail because of the hip stem. Surgical fixation will require a large incision and dissection with plate fixation. This will be a complex procedure but should hopefully allow for better pain control post-operatively and allow for transfers. Will be non to partial weight bearing for an extended time. I have spoken with family members and told them that it is highly unlikely that  she will ever be able to regain her level of function that she had prior to the fracture. The other option is non-operative treatment with a brace but it is less likely to achieve adequate pain control and will not allow for easier mobilization in comparison to surgical treatment. The upside of non-operative management is avoiding the risks of surgery, which are not to be discounted in a large operation in a patient with significant co-morbidities. I had a long discussion with family members presenting the pros and cons of each option and counseling them through this very difficult decision. We discussed the procedure, risks and potential complications associated with each option and they have elected to pursue surgical treatment. We will proceed tomorrow.  Gearlean Alf 09/29/2016, 6:42 PM

## 2016-09-29 NOTE — ED Triage Notes (Signed)
Patient here from [facility] with complaints of fall today at the facility. Reports left leg pain. Swelling noted. Denies LOC, no blood thinners.

## 2016-09-29 NOTE — H&P (Signed)
History and Physical    [name] ZOX:[id number] DOB: 02-23-1921 DOA: 09/29/2016  PCP: Merlene Laughter, MD   Patient coming from: Skilled nursing facility   Chief Complaint: Left lower extremity pain after a mechanical fall.   HPI: [name] is a 83 y.o. female with medical history significant of moderate to severe dementia. She does have ambulatory dysfunction, requires a walker for ambulation. Today she sustained a mechanical fall, not witnessed, she was found laying on her left side, unable to stand back on her feet due to severe pain. She denies any loss of consciousness, dizziness, lightheadedness or head trauma. Currently the pain is localized in her left thigh, it is 10 out of 10 in intensity, worse with movement, improved with IV analgesics, no radiation, no associated dyspnea, nausea or vomiting.   She is sedentary, has limited mobility, uses a walker, only ambulates in the hallways of the facility about 3 times a day. No angina, no claudication, no PND orthopnea. She requires assistance for her activities of daily living due to her dementia.  ED Course: Patient was found to have a left lower extremity deformity, imaging showed a femoral fracture, orthopedics was contacted, with recommendations to admit to the medical service for stabilization in preparation for surgical intervention.   Review of Systems:  1. General. No fevers no chills no weight loss or weight gain 2. ENT no runny nose or sore throat 3. Pulmonary no shortness of breath, cough or hemoptysis 4. Cardiovascular, no angina, no claudication, no PND orthopnea 5. Gastrointestinal. No nausea, vomiting or diarrhea 6. Musculoskeletal positive for left lower extremity pain as mentioned in history present name was 7. Hematology no easy bruisability or frequent infections 8. Urology no dysuria, increased urinary frequency or polyuria 9. Neurology no seizures, paresthesias, positive cognitive impairment as mentioned in  history present illness 10. Endocrine, no tremors, cold or heat intolerance   Past Medical History:  Diagnosis Date  . CAD (coronary artery disease)   . Chronic low back pain   . Contusion of foot, right 05/15/12  . Diverticulitis   . Dyslipidemia   . H/O: hysterectomy   . HTN (hypertension)   . Memory disturbance 05/2012   MMSE 26/30, intact Clock test  . Nonischemic cardiomyopathy (HCC)   . Osteoporosis   . Pulmonary embolism (HCC)   . Right knee pain 05/15/12  . Spinal stenosis   . Ulcer disease   . Weight loss     Past Surgical History:  Procedure Laterality Date  . APPENDECTOMY  2010  . BACK SURGERY    . BIV ICD GENERTAOR CHANGE OUT N/A 05/24/2011   Procedure: BIV ICD GENERTAOR CHANGE OUT;  Surgeon: [name], MD;  Location: [facility] CATH LAB;  Service: Cardiovascular;  Laterality: N/A;  . CATARACT EXTRACTION    . COLON RESECTION  2005  . CORONARY ANGIOPLASTY WITH STENT PLACEMENT  01/2003  . HEMICOLECTOMY    . HIP ARTHROPLASTY Left 04/05/2014   Procedure: ARTHROPLASTY BIPOLAR HIP;  Surgeon: Shelda Pal, MD;  Location: [facility] ORS;  Service: Orthopedics;  Laterality: Left;  . left shoulder replacement  1991   Dr. [name]  . right shoulder repair  1985   Dr. [name]  . rotator cuff debridement  1999   Dr. [name]     reports that she has never smoked. She has never used smokeless tobacco. She reports that she does not drink alcohol or use drugs.  Allergies  Allergen Reactions  . Arthrotec [Diclofenac-Misoprostol]   .  Ibuprofen   . Lactose Intolerance (Gi)   . Milk-Related Compounds Nausea And Vomiting  . Other     Grass and ragweed  . Oxycontin [Oxycodone Hcl]   . Pollen Extract     Family History  Problem Relation Age of Onset  . Coronary artery disease Sister   . Heart attack Father 12  . Heart attack Mother   . Other Mother        abdominal blockage     Prior to Admission medications   Medication Sig Start Date End Date Taking? Authorizing Provider    acetaminophen (TYLENOL) 325 MG tablet Take 2 tablets (650 mg total) by mouth every 6 (six) hours as needed for mild pain (or Fever >/= 101). 04/06/14   Richarda Overlie, MD  albuterol (PROVENTIL HFA;VENTOLIN HFA) 108 (90 BASE) MCG/ACT inhaler Inhale 1-2 puffs into the lungs every 6 (six) hours as needed for wheezing. 10/09/12   Derwood Kaplan, MD  alendronate (FOSAMAX) 35 MG tablet Take 35 mg by mouth every 7 (seven) days. Take with a full glass of water on an empty stomach.    [provider]  aspirin EC 325 MG tablet Take 1 tablet (325 mg total) by mouth 2 (two) times daily. Take for 4 weeks 04/05/14   Durene Romans, MD  carvedilol (COREG) 3.125 MG tablet Take 3.125 mg by mouth 2 (two) times daily with a meal.    [provider]  cyanocobalamin 1000 MCG tablet Take 100 mcg by mouth daily.    [provider]  docusate sodium 100 MG CAPS Take 100 mg by mouth 2 (two) times daily. 04/06/14   Richarda Overlie, MD  DULoxetine (CYMBALTA) 30 MG capsule Take 30 mg by mouth daily.      [provider]  febuxostat (ULORIC) 40 MG tablet Take 40 mg by mouth daily.    [provider]  HYDROcodone-acetaminophen (NORCO/VICODIN) 5-325 MG per tablet Take 1-2 tablets by mouth every 6 (six) hours as needed for moderate pain. 04/06/14   Richarda Overlie, MD  lansoprazole (PREVACID) 15 MG capsule Take 15 mg by mouth every morning. For reflux.    [provider]  nitroGLYCERIN (NITROSTAT) 0.4 MG SL tablet Place 0.4 mg under the tongue every 5 (five) minutes as needed for chest pain.    [provider]  polyethylene glycol (MIRALAX / GLYCOLAX) packet Take 17 g by mouth daily.    [provider]  Polysaccharide Iron Complex (POLY-IRON 150 PO) Take 1 tablet by mouth daily.     [provider]  torsemide (DEMADEX) 10 MG tablet Take 10 mg by mouth every other day. 06/17/14   [provider]    Physical Exam: Vitals:   09/29/16 1345 09/29/16 1531   BP: 132/87 135/81  Pulse: 87 86  Resp: (!) 22 20  Temp: 98.6 F (37 C)   TempSrc: Oral   SpO2: 98% 98%    Constitutional: deconditioned,  Vitals:   09/29/16 1345 09/29/16 1531  BP: 132/87 135/81  Pulse: 87 86  Resp: (!) 22 20  Temp: 98.6 F (37 C)   TempSrc: Oral   SpO2: 98% 98%   Eyes: PERRL, lids and conjunctivae with mild pallor, no icterus.  ENMT: Mucous membranes are dry. Posterior pharynx clear of any exudate or lesions.Normal dentition.  Neck: normal, supple, no masses, no thyromegaly Respiratory: clear to auscultation bilaterally, no wheezing, no crackles. Normal respiratory effort. No accessory muscle use.  Cardiovascular: Regular rate and rhythm, no murmurs /  rubs / gallops. No extremity edema. 2+ pedal pulses. No carotid bruits.  Abdomen: no tenderness, no masses palpated. No hepatosplenomegaly. Bowel sounds positive.  Musculoskeletal: no clubbing / cyanosis. Positive shortening of the left lower extremity, no ecchymosis. Decreased mobility due to pain Skin: no rashes, lesions, ulcers. No induration Neurologic patient awake and alert, baseline confusion, limited mobility of the left lower extremity due to pain.   Labs on Admission: I have personally reviewed following labs and imaging studies  CBC:  Recent Labs Lab 09/29/16 1521  WBC 11.8*  NEUTROABS 8.9*  HGB 12.5  HCT 36.5  MCV 101.7*  PLT 203   Basic Metabolic Panel:  Recent Labs Lab 09/29/16 1521  NA 141  K 4.4  CL 103  CO2 28  GLUCOSE 115*  BUN 28*  CREATININE 1.08*  CALCIUM 9.0   GFR: CrCl cannot be calculated (Unknown ideal weight.). Liver Function Tests: No results for input(s): AST, ALT, ALKPHOS, BILITOT, PROT, ALBUMIN in the last 168 hours. No results for input(s): LIPASE, AMYLASE in the last 168 hours. No results for input(s): AMMONIA in the last 168 hours. Coagulation Profile: No results for input(s): INR, PROTIME in the last 168 hours. Cardiac Enzymes: No results for  input(s): CKTOTAL, CKMB, CKMBINDEX, TROPONINI in the last 168 hours. BNP (last 3 results) No results for input(s): PROBNP in the last 8760 hours. HbA1C: No results for input(s): HGBA1C in the last 72 hours. CBG: No results for input(s): GLUCAP in the last 168 hours. Lipid Profile: No results for input(s): CHOL, HDL, LDLCALC, TRIG, CHOLHDL, LDLDIRECT in the last 72 hours. Thyroid Function Tests: No results for input(s): TSH, T4TOTAL, FREET4, T3FREE, THYROIDAB in the last 72 hours. Anemia Panel: No results for input(s): VITAMINB12, FOLATE, FERRITIN, TIBC, IRON, RETICCTPCT in the last 72 hours. Urine analysis:    Component Value Date/Time   COLORURINE YELLOW 04/04/2014 2228   APPEARANCEUR CLOUDY (A) 04/04/2014 2228   LABSPEC 1.008 04/04/2014 2228   PHURINE 5.5 04/04/2014 2228   GLUCOSEU NEGATIVE 04/04/2014 2228   HGBUR NEGATIVE 04/04/2014 2228   BILIRUBINUR NEGATIVE 04/04/2014 2228   KETONESUR NEGATIVE 04/04/2014 2228   PROTEINUR NEGATIVE 04/04/2014 2228   UROBILINOGEN 0.2 04/04/2014 2228   NITRITE NEGATIVE 04/04/2014 2228   LEUKOCYTESUR TRACE (A) 04/04/2014 2228    Radiological Exams on Admission: Dg Pelvis 1-2 Views  Result Date: 09/29/2016 CLINICAL DATA:  Left thigh pain following a fall today. EXAM: PELVIS - 1-2 VIEW COMPARISON:  04/05/2014. FINDINGS: Stable left hip bipolar prosthesis. Old, healed bilateral pubic fractures. No acute fracture or dislocation. 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 Lower lumbar spine degenerative changes. IMPRESSION: 1. No acute fracture or dislocation. 2. 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 Electronically Signed   By: Beckie Salts M.D.   On: 09/29/2016 15:00   Dg Tibia/fibula Left  Result Date: 09/29/2016 CLINICAL DATA:  Left leg pain following a fall today. EXAM: LEFT TIBIA AND FIBULA - 2 VIEW COMPARISON:  None. FINDINGS: Left total knee prosthesis. No fracture or dislocation seen. The images at the level of the ankle  are suboptimally positioned due to a femur fracture. Tarsal degenerative changes are noted in the foot. IMPRESSION: No lower leg fracture or dislocation seen. Electronically Signed   By: Beckie Salts M.D.   On: 09/29/2016 15:04   Ct Head Wo Contrast  Result Date: 09/29/2016 CLINICAL DATA:  [facility], denies loss of consciousness, history hypertension, non ischemic cardiomyopathy, coronary artery disease EXAM: CT HEAD  WITHOUT CONTRAST CT CERVICAL SPINE WITHOUT CONTRAST TECHNIQUE: Multidetector CT imaging of the head and cervical spine was performed following the standard protocol without intravenous contrast. Multiplanar CT image reconstructions of the cervical spine were also generated. COMPARISON:  04/04/2014 FINDINGS: CT HEAD FINDINGS Brain: Generalized atrophy. Normal ventricular morphology. No midline shift or mass effect. Small vessel chronic ischemic changes of deep cerebral white matter. No intracranial hemorrhage, mass lesion, evidence of acute infarction, or extra-axial fluid collection. Vascular: Atherosclerotic calcifications of internal carotid arteries at skullbase Skull: Osseous demineralization.  Calvaria intact. Sinuses/Orbits: Chronic opacification of portions of the RIGHT mastoid air cells. Remaining sinuses and LEFT mastoid air cells clear Other: N/A CT CERVICAL SPINE FINDINGS Alignment: Normal Skull base and vertebrae: Osseous demineralization. Visualized skullbase intact. Vertebral body heights maintained without fracture or bone destruction. Multilevel facet degenerative changes bilaterally. Ankylosis of C2-C3 facet joints. Soft tissues and spinal canal: Prevertebral soft tissues normal thickness. Atherosclerotic calcifications at the carotid bifurcations. Disc levels: Mild scattered disc space narrowing. Spinal canal patent. Beam hardening artifacts from patient shoulders. Upper chest: Visualized lung apices clear. Other: N/A IMPRESSION: Atrophy with small vessel  chronic ischemic changes of deep cerebral white matter. No acute intracranial abnormalities. Degenerative disc and facet disease changes cervical spine. No acute cervical spine abnormalities. Electronically Signed   By: Ulyses Southward M.D.   On: 09/29/2016 14:34   Ct Cervical Spine Wo Contrast  Result Date: 09/29/2016 CLINICAL DATA:  [facility]y [facility], denies loss of consciousness, history hypertension, non ischemic cardiomyopathy, coronary artery disease EXAM: CT HEAD WITHOUT CONTRAST CT CERVICAL SPINE WITHOUT CONTRAST TECHNIQUE: Multidetector CT imaging of the head and cervical spine was performed following the standard protocol without intravenous contrast. Multiplanar CT image reconstructions of the cervical spine were also generated. COMPARISON:  04/04/2014 FINDINGS: CT HEAD FINDINGS Brain: Generalized atrophy. Normal ventricular morphology. No midline shift or mass effect. Small vessel chronic ischemic changes of deep cerebral white matter. No intracranial hemorrhage, mass lesion, evidence of acute infarction, or extra-axial fluid collection. Vascular: Atherosclerotic calcifications of internal carotid arteries at skullbase Skull: Osseous demineralization.  Calvaria intact. Sinuses/Orbits: Chronic opacification of portions of the RIGHT mastoid air cells. Remaining sinuses and LEFT mastoid air cells clear Other: N/A CT CERVICAL SPINE FINDINGS Alignment: Normal Skull base and vertebrae: Osseous demineralization. Visualized skullbase intact. Vertebral body heights maintained without fracture or bone destruction. Multilevel facet degenerative changes bilaterally. Ankylosis of C2-C3 facet joints. Soft tissues and spinal canal: Prevertebral soft tissues normal thickness. Atherosclerotic calcifications at the carotid bifurcations. Disc levels: Mild scattered disc space narrowing. Spinal canal patent. Beam hardening artifacts from patient shoulders. Upper chest: Visualized lung apices clear. Other:  N/A IMPRESSION: Atrophy with small vessel chronic ischemic changes of deep cerebral white matter. No acute intracranial abnormalities. Degenerative disc and facet disease changes cervical spine. No acute cervical spine abnormalities. Electronically Signed   By: Ulyses Southward M.D.   On: 09/29/2016 14:34   Dg Femur Min 2 Views Left  Result Date: 09/29/2016 CLINICAL DATA:  Left thigh pain following a fall today. EXAM: LEFT FEMUR 2 VIEWS COMPARISON:  None. FINDINGS: Oblique fracture of the distal femoral shaft with almost 1 shaft width of medial displacement and medial angulation of the distal fragment. There is also posterior angulation of the distal fragment. The fracture is mildly comminuted with 4 cm of overlapping of the fragments. Left knee and left hip prostheses are noted as well as atheromatous arterial calcifications. IMPRESSION: Distal femoral shaft fracture, as described above. Electronically  Signed   By: Beckie Salts M.D.   On: 09/29/2016 15:03    EKG: Independently reviewed. Sinus rhythm, left bundle branch block, lateral T-wave inversions. Chronic changes compared from 2017  Assessment/Plan Active Problems:   Hip fracture [facility])  This is a 83 year old female with moderate to advanced dementia who presented to the hospital after a mechanical fall. No evidence of syncope, patient has limited mobility due to her dementia and ambulatory dysfunction. No angina on exertion or heart failure symptoms. Blood pressure 138/81, heart rate 86, respiratory rate 20, oxygen saturation 98%. She does have mild pallor, dry oral mucosa, lungs clear to auscultation, heart S1-S2 present with no murmurs or gallops, abdomen is soft, lower extremities with no edema, positive shortening left lower extremity. Sodium 141, potassium 4.4, chloride 103, bicarbonate 28, glucose 115, BUN 28, creatinine 1.08, white count 11.8, hemoglobin 12.5, hematocrit 36.5, platelets 203. Head and neck CT with no acute abnormalities, x-rays  with distal femoral shaft fracture.   Working diagnosis left thigh pain due to traumatic distal femoral shaft fracture.   1. Distal femoral shaft fracture, on the left. Patient will be admitted to the medical ward, she will received DVT prophylaxis, IV analgesia with morphine, and oral hydrocodone. Orthopedics have been consulted for surgical intervention. Patient has moderate risk from cardiovascular perspective, as well as moderate risk for medical complications related to her age, decreased mobility and advanced dementia. Risk versus benefit consideration, it is recommended to proceed with surgical intervention to allow patient mobility and avoid a bedbound status.   2. Hypertension. Continue carvedilol, hold on furosemide, will place patient on gentle hydration with D5 normal saline.  3. Symptomatic bradycardia, sinus node dysfunction, status post pacemaker insertion. Patient was seen in April 2017, old records personally reviewed, then the device was working appropriately. Metronic DDD, PM.   4. Coronary artery disease. Continue aspirin for antiplatelet therapy. Patient is asymptomatic, no history or evidence of angina.  5. Nonischemic cardiomyopathy with systolic heart failure. At one point time she did have a AICD, indicating an ejection fraction of left ventricle less than 35%. Apparently she experienced a lead fracture and the defibrillator capability were discontinued, currently on a pacer function alone. Continue Coreg, hold torsemide, gentle hydration.  6. Depression/ dementia. Continue Cymbalta, neuro checks per unit protocol.   DVT prophylaxis: enoxaparin Code Status: DNR Family Communication: I spoke with patient's family at the bedside, all questions were addressed Disposition Plan: Skilled nursing facility Consults called: Orthopedics  Admission status: Inpatient  Sia Gabrielsen Annett Gula MD [facility] Pager [phone]  If 7PM-7AM, please contact  night-coverage [email or web address] Password [facility]  09/29/2016, 4:22 PM

## 2016-09-29 NOTE — ED Provider Notes (Signed)
[facility]-EMERGENCY DEPT Provider Note   CSN: [id number] Arrival date & time: 09/29/16  1319     History   Chief Complaint Chief Complaint  Patient presents with  . Leg Injury  . Fall    HPI [name] is a 83 y.o. female.  HPI 83 year old female past medical history significant for CAD, hypertension, dementia that presents to the emergency department today by EMS from [facility] facility with daughter at bedside with complaints of fall was unwitnessed. Daughter at bedside states that the facility called her states the patient fell in her room. She does use a walker at baseline. History of falls. Patient reports significant and severe left leg pain. The fall was unwitnessed. She does have shortening and external rotation of the leg. Daughter states patient is not on any blood thinners. Patient has no other complaints at this time. She is alert and oriented to person and place which is baseline for patient per daughter.  There is a Level V caveat due to dementia.    Past Medical History:  Diagnosis Date  . CAD (coronary artery disease)   . Chronic low back pain   . Contusion of foot, right 05/15/12  . Diverticulitis   . Dyslipidemia   . H/O: hysterectomy   . HTN (hypertension)   . Memory disturbance 05/2012   MMSE 26/30, intact Clock test  . Nonischemic cardiomyopathy (HCC)   . Osteoporosis   . Pulmonary embolism (HCC)   . Right knee pain 05/15/12  . Spinal stenosis   . Ulcer disease   . Weight loss     Patient Active Problem List   Diagnosis Date Noted  . Osteoarthritis 05/10/2014  . Gout 05/10/2014  . Anemia 04/19/2014  . Constipation 04/19/2014  . Preop cardiovascular exam 04/05/2014  . Hip fracture (HCC) 04/04/2014  . Right knee pain   . Memory disturbance   . Weight loss 05/27/2012  . Contusion of foot, right 05/15/2012  . Palpitations 01/09/2011  . Pacemaker 10/10/2010  . Chronic systolic heart failure (HCC) 10/10/2010  . Dyslipidemia 10/10/2010    Past  Surgical History:  Procedure Laterality Date  . APPENDECTOMY  2010  . BACK SURGERY    . BIV ICD GENERTAOR CHANGE OUT N/A 05/24/2011   Procedure: BIV ICD GENERTAOR CHANGE OUT;  Surgeon: [name], MD;  Location[facility] C[facility] LAB;  Service: Cardiovascular;  Laterality: N/A;  . CATARACT EXTRACTION    . COLON RESECTION  2005  . CORONARY ANGIOPLASTY WITH STENT PLACEMENT  01/2003  . HEMICOLECTOMY    . HIP ARTHROPLASTY Left 04/05/2014   Procedure: ARTHROPLASTY BIPOLAR HIP;  Surgeon: Shelda Pal, MD;  Location: [facility] ORS;  Service: Orthopedics;  Laterality: Left;  . left shoulder replacement  1991   Dr. [name]  . right shoulder repair  1985   Dr. [name]  . rotator cuff debridement  1999   Dr. [name]    OB History    No data available       Home Medications    Prior to Admission medications   Medication Sig Start Date End Date Taking? Authorizing Provider  acetaminophen (TYLENOL) 325 MG tablet Take 2 tablets (650 mg total) by mouth every 6 (six) hours as needed for mild pain (or Fever >/= 101). 04/06/14   Richarda Overlie, MD  albuterol (PROVENTIL HFA;VENTOLIN HFA) 108 (90 BASE) MCG/ACT inhaler Inhale 1-2 puffs into the lungs every 6 (six) hours as needed for wheezing. 10/09/12   Derwood Kaplan, MD  alendronate (FOSAMAX)  35 MG tablet Take 35 mg by mouth every 7 (seven) days. Take with a full glass of water on an empty stomach.    [provider]  aspirin EC 325 MG tablet Take 1 tablet (325 mg total) by mouth 2 (two) times daily. Take for 4 weeks 04/05/14   Durene Romans, MD  carvedilol (COREG) 3.125 MG tablet Take 3.125 mg by mouth 2 (two) times daily with a meal.    [provider]  cyanocobalamin 1000 MCG tablet Take 100 mcg by mouth daily.    [provider]  docusate sodium 100 MG CAPS Take 100 mg by mouth 2 (two) times daily. 04/06/14   Richarda Overlie, MD  DULoxetine (CYMBALTA) 30 MG capsule Take 30 mg by mouth daily.      [provider]  febuxostat (ULORIC) 40  MG tablet Take 40 mg by mouth daily.    [provider]  HYDROcodone-acetaminophen (NORCO/VICODIN) 5-325 MG per tablet Take 1-2 tablets by mouth every 6 (six) hours as needed for moderate pain. 04/06/14   Richarda Overlie, MD  lansoprazole (PREVACID) 15 MG capsule Take 15 mg by mouth every morning. For reflux.    [provider]  nitroGLYCERIN (NITROSTAT) 0.4 MG SL tablet Place 0.4 mg under the tongue every 5 (five) minutes as needed for chest pain.    [provider]  polyethylene glycol (MIRALAX / GLYCOLAX) packet Take 17 g by mouth daily.    [provider]  Polysaccharide Iron Complex (POLY-IRON 150 PO) Take 1 tablet by mouth daily.     [provider]  torsemide (DEMADEX) 10 MG tablet Take 10 mg by mouth every other day. 06/17/14   [provider]    Family History Family History  Problem Relation Age of Onset  . Coronary artery disease Sister   . Heart attack Father 73  . Heart attack Mother   . Other Mother        abdominal blockage    Social History Social History  Substance Use Topics  . Smoking status: Never Smoker  . Smokeless tobacco: Never Used  . Alcohol use No     Allergies   Arthrotec [diclofenac-misoprostol]; Ibuprofen; Lactose intolerance (gi); Milk-related compounds; Other; Oxycontin [oxycodone hcl]; and Pollen extract   Review of Systems Review of Systems  Unable to perform ROS: Dementia     Physical Exam Updated Vital Signs BP 132/87 (BP Location: Right Arm)   Pulse 87   Temp 98.6 F (37 C) (Oral)   Resp (!) 22   SpO2 98%   Physical Exam  Constitutional: She is oriented to person, place, and time. She appears well-developed and well-nourished.  Non-toxic appearance. No distress.  HENT:  Head: Normocephalic and atraumatic.  Nose: Nose normal.  Mouth/Throat: Oropharynx is clear and moist.  No bilateral hemotympanum noted. No septal hematoma.  Eyes: Pupils are equal, round, and reactive to light.  Conjunctivae are normal. Right eye exhibits no discharge. Left eye exhibits no discharge.  Neck: Normal range of motion. Neck supple.  No c spine midline tenderness. No paraspinal tenderness. No deformities or step offs noted. Full ROM. Supple. No nuchal rigidity.    Cardiovascular: Normal rate, regular rhythm, normal heart sounds and intact distal pulses.  Exam reveals no gallop and no friction rub.   No murmur heard. Pulmonary/Chest: Effort normal and breath sounds normal. No respiratory distress. She has no wheezes. She has no rales. She exhibits no tenderness.  Abdominal: Soft. Bowel sounds are normal. There  is no tenderness. There is no rebound and no guarding.  Musculoskeletal: Normal range of motion. She exhibits no tenderness.  Shortening and external rotation of the left leg. The pain with movement. Pulses obtained with Doppler and are 2+. Does have some pitting edema noted to the level of the shin. No open wound noted. No obvious bruising.  No midline T spine or L spine tenderness. No deformities or step offs noted. Full ROM. Pelvis is stable.   Lymphadenopathy:    She has no cervical adenopathy.  Neurological: She is alert and oriented to person, place, and time.  Skin: Skin is warm and dry. Capillary refill takes less than 2 seconds.  Psychiatric: Her behavior is normal. Judgment and thought content normal.  Nursing note and vitals reviewed.    ED Treatments / Results  Labs (all labs ordered are listed, but only abnormal results are displayed) Labs Reviewed  CBC WITH DIFFERENTIAL/PLATELET - Abnormal; Notable for the following:       Result Value   WBC 11.8 (*)    RBC 3.59 (*)    MCV 101.7 (*)    MCH 34.8 (*)    Neutro Abs 8.9 (*)    All other components within normal limits  BASIC METABOLIC PANEL - Abnormal; Notable for the following:    Glucose, Bld 115 (*)    BUN 28 (*)    Creatinine, Ser 1.08 (*)    GFR calc non Af Amer 43 (*)    GFR calc Af Amer 49 (*)    All  other components within normal limits    EKG  EKG Interpretation None       Radiology Dg Pelvis 1-2 Views  Result Date: 09/29/2016 CLINICAL DATA:  Left thigh pain following a fall today. EXAM: PELVIS - 1-2 VIEW COMPARISON:  04/05/2014. FINDINGS: Stable left hip bipolar prosthesis. Old, healed bilateral pubic fractures. No acute fracture or dislocation. 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 Lower lumbar spine degenerative changes. IMPRESSION: 1. No acute fracture or dislocation. 2. 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 Electronically Signed   By: Beckie Salts M.D.   On: 09/29/2016 15:00   Dg Tibia/fibula Left  Result Date: 09/29/2016 CLINICAL DATA:  Left leg pain following a fall today. EXAM: LEFT TIBIA AND FIBULA - 2 VIEW COMPARISON:  None. FINDINGS: Left total knee prosthesis. No fracture or dislocation seen. The images at the level of the ankle are suboptimally positioned due to a femur fracture. Tarsal degenerative changes are noted in the foot. IMPRESSION: No lower leg fracture or dislocation seen. Electronically Signed   By: Beckie Salts M.D.   On: 09/29/2016 15:04   Ct Head Wo Contrast  Result Date: 09/29/2016 CLINICAL DATA:  [facility]y [facility], denies loss of consciousness, history hypertension, non ischemic cardiomyopathy, coronary artery disease EXAM: CT HEAD WITHOUT CONTRAST CT CERVICAL SPINE WITHOUT CONTRAST TECHNIQUE: Multidetector CT imaging of the head and cervical spine was performed following the standard protocol without intravenous contrast. Multiplanar CT image reconstructions of the cervical spine were also generated. COMPARISON:  04/04/2014 FINDINGS: CT HEAD FINDINGS Brain: Generalized atrophy. Normal ventricular morphology. No midline shift or mass effect. Small vessel chronic ischemic changes of deep cerebral white matter. No intracranial hemorrhage, mass lesion, evidence of acute infarction, or extra-axial fluid collection.  Vascular: Atherosclerotic calcifications of internal carotid arteries at skullbase Skull: Osseous demineralization.  Calvaria intact. Sinuses/Orbits: Chronic opacification of portions of the RIGHT mastoid air cells. Remaining sinuses and LEFT mastoid air  cells clear Other: N/A CT CERVICAL SPINE FINDINGS Alignment: Normal Skull base and vertebrae: Osseous demineralization. Visualized skullbase intact. Vertebral body heights maintained without fracture or bone destruction. Multilevel facet degenerative changes bilaterally. Ankylosis of C2-C3 facet joints. Soft tissues and spinal canal: Prevertebral soft tissues normal thickness. Atherosclerotic calcifications at the carotid bifurcations. Disc levels: Mild scattered disc space narrowing. Spinal canal patent. Beam hardening artifacts from patient shoulders. Upper chest: Visualized lung apices clear. Other: N/A IMPRESSION: Atrophy with small vessel chronic ischemic changes of deep cerebral white matter. No acute intracranial abnormalities. Degenerative disc and facet disease changes cervical spine. No acute cervical spine abnormalities. Electronically Signed   By: Ulyses Southward M.D.   On: 09/29/2016 14:34   Ct Cervical Spine Wo Contrast  Result Date: 09/29/2016 CLINICAL DATA:  Larey Seat today at [facility], denies loss of consciousness, history hypertension, non ischemic cardiomyopathy, coronary artery disease EXAM: CT HEAD WITHOUT CONTRAST CT CERVICAL SPINE WITHOUT CONTRAST TECHNIQUE: Multidetector CT imaging of the head and cervical spine was performed following the standard protocol without intravenous contrast. Multiplanar CT image reconstructions of the cervical spine were also generated. COMPARISON:  04/04/2014 FINDINGS: CT HEAD FINDINGS Brain: Generalized atrophy. Normal ventricular morphology. No midline shift or mass effect. Small vessel chronic ischemic changes of deep cerebral white matter. No intracranial hemorrhage, mass lesion, evidence of acute  infarction, or extra-axial fluid collection. Vascular: Atherosclerotic calcifications of internal carotid arteries at skullbase Skull: Osseous demineralization.  Calvaria intact. Sinuses/Orbits: Chronic opacification of portions of the RIGHT mastoid air cells. Remaining sinuses and LEFT mastoid air cells clear Other: N/A CT CERVICAL SPINE FINDINGS Alignment: Normal Skull base and vertebrae: Osseous demineralization. Visualized skullbase intact. Vertebral body heights maintained without fracture or bone destruction. Multilevel facet degenerative changes bilaterally. Ankylosis of C2-C3 facet joints. Soft tissues and spinal canal: Prevertebral soft tissues normal thickness. Atherosclerotic calcifications at the carotid bifurcations. Disc levels: Mild scattered disc space narrowing. Spinal canal patent. Beam hardening artifacts from patient shoulders. Upper chest: Visualized lung apices clear. Other: N/A IMPRESSION: Atrophy with small vessel chronic ischemic changes of deep cerebral white matter. No acute intracranial abnormalities. Degenerative disc and facet disease changes cervical spine. No acute cervical spine abnormalities. Electronically Signed   By: Ulyses Southward M.D.   On: 09/29/2016 14:34   Dg Femur Min 2 Views Left  Result Date: 09/29/2016 CLINICAL DATA:  Left thigh pain following a fall today. EXAM: LEFT FEMUR 2 VIEWS COMPARISON:  None. FINDINGS: Oblique fracture of the distal femoral shaft with almost 1 shaft width of medial displacement and medial angulation of the distal fragment. There is also posterior angulation of the distal fragment. The fracture is mildly comminuted with 4 cm of overlapping of the fragments. Left knee and left hip prostheses are noted as well as atheromatous arterial calcifications. IMPRESSION: Distal femoral shaft fracture, as described above. Electronically Signed   By: Beckie Salts M.D.   On: 09/29/2016 15:03    Procedures Procedures (including critical care  time)  Medications Ordered in ED Medications  fentaNYL (SUBLIMAZE) injection 25 mcg (25 mcg Intravenous Given 09/29/16 1408)  morphine 2 MG/ML injection 2 mg (2 mg Intravenous Given 09/29/16 1525)  morphine 4 MG/ML injection 4 mg (4 mg Intravenous Given 09/29/16 1607)     Initial Impression / Assessment and Plan / ED Course  I have reviewed the triage vital signs and the nursing notes.  Pertinent labs & imaging results that were available during my care of the patient were reviewed by me and  considered in my medical decision making (see chart for details).     Patient resents to the ED after mechanical fall with left leg pain. She has obvious shortening and rotation left leg. X-ray reveals a distal femur fracture. Patient is neurovascularly intact. Preop labs were obtained which were at patient's baseline. Spoke with Dr. [name] with orthopedic surgery who wants me to admit patient to the hospital he will consult on patient for surgical intervention. Patient nothing by mouth at this time. Pain has been managed in the ED with pain medication. Remains neurovascularly intact. Pt seen by my attending Dr. Donnald Garre who is agreeable to the above plan.    Spoke with Dr. Ella Jubilee with hospital medicine who agrees to admit patient and will place admission orders and see patient in the ED. Patient remains hemodynamically stable this time.  Final Clinical Impressions(s) / ED Diagnoses   Final diagnoses:  Closed fracture of distal end of left femur, unspecified fracture morphology, initial encounter [facility])    New Prescriptions New Prescriptions   No medications on file     Wallace Keller 09/29/16 1628    Arby Barrette, MD 10/04/16 1659

## 2016-09-30 ENCOUNTER — Inpatient Hospital Stay (HOSPITAL_COMMUNITY): Payer: Medicare Other | Admitting: Certified Registered Nurse Anesthetist

## 2016-09-30 ENCOUNTER — Encounter (HOSPITAL_COMMUNITY)
Admission: EM | Disposition: A | Discharge status: Skilled Nursing Facility | Payer: Self-pay | Source: Home / Self Care | Attending: Internal Medicine

## 2016-09-30 ENCOUNTER — Inpatient Hospital Stay (HOSPITAL_COMMUNITY): Payer: Medicare Other

## 2016-09-30 DIAGNOSIS — Z96659 Presence of unspecified artificial knee joint: Secondary | ICD-10-CM

## 2016-09-30 DIAGNOSIS — I5022 Chronic systolic (congestive) heart failure: Secondary | ICD-10-CM

## 2016-09-30 DIAGNOSIS — M978XXA Periprosthetic fracture around other internal prosthetic joint, initial encounter: Secondary | ICD-10-CM

## 2016-09-30 HISTORY — PX: ORIF HIP FRACTURE: SHX2125

## 2016-09-30 LAB — CBC
HCT: 32.8 % — ABNORMAL LOW (ref 36.0–46.0)
HEMOGLOBIN: 11 g/dL — AB (ref 12.0–15.0)
MCH: 34 pg (ref 26.0–34.0)
MCHC: 33.5 g/dL (ref 30.0–36.0)
MCV: 101.2 fL — AB (ref 78.0–100.0)
Platelets: 174 10*3/uL (ref 150–400)
RBC: 3.24 MIL/uL — AB (ref 3.87–5.11)
RDW: 13.7 % (ref 11.5–15.5)
WBC: 11.4 10*3/uL — ABNORMAL HIGH (ref 4.0–10.5)

## 2016-09-30 LAB — COMPREHENSIVE METABOLIC PANEL
ALBUMIN: 3.6 g/dL (ref 3.5–5.0)
ALK PHOS: 57 U/L (ref 38–126)
ALT: 8 U/L — AB (ref 14–54)
AST: 28 U/L (ref 15–41)
Anion gap: 10 (ref 5–15)
BUN: 29 mg/dL — AB (ref 6–20)
CALCIUM: 8.5 mg/dL — AB (ref 8.9–10.3)
CHLORIDE: 102 mmol/L (ref 101–111)
CO2: 28 mmol/L (ref 22–32)
CREATININE: 1.18 mg/dL — AB (ref 0.44–1.00)
GFR calc Af Amer: 44 mL/min — ABNORMAL LOW (ref >60)
GFR calc non Af Amer: 38 mL/min — ABNORMAL LOW (ref >60)
GLUCOSE: 135 mg/dL — AB (ref 65–99)
Potassium: 4.6 mmol/L (ref 3.5–5.1)
SODIUM: 140 mmol/L (ref 135–145)
Total Bilirubin: 1.4 mg/dL — ABNORMAL HIGH (ref 0.3–1.2)
Total Protein: 6.5 g/dL (ref 6.5–8.1)

## 2016-09-30 LAB — HEMOGLOBIN AND HEMATOCRIT, BLOOD
HCT: 29.4 % — ABNORMAL LOW (ref 36.0–46.0)
Hemoglobin: 9.6 g/dL — ABNORMAL LOW (ref 12.0–15.0)

## 2016-09-30 LAB — SURGICAL PCR SCREEN
MRSA, PCR: NEGATIVE
STAPHYLOCOCCUS AUREUS: NEGATIVE

## 2016-09-30 LAB — PREPARE RBC (CROSSMATCH)

## 2016-09-30 SURGERY — OPEN REDUCTION INTERNAL FIXATION HIP
Anesthesia: General | Site: Hip | Laterality: Left

## 2016-09-30 MED ORDER — ROCURONIUM BROMIDE 50 MG/5ML IV SOSY
PREFILLED_SYRINGE | INTRAVENOUS | Status: AC
  Filled 2016-09-30: qty 5

## 2016-09-30 MED ORDER — PROPOFOL 10 MG/ML IV BOLUS
INTRAVENOUS | Status: AC
  Filled 2016-09-30: qty 20

## 2016-09-30 MED ORDER — CEFAZOLIN SODIUM-DEXTROSE 1-4 GM/50ML-% IV SOLN
1.0000 g | Freq: Two times a day (BID) | INTRAVENOUS | Status: AC
  Administered 2016-09-30 – 2016-10-01 (×2): 1 g via INTRAVENOUS
  Filled 2016-09-30 (×2): qty 50

## 2016-09-30 MED ORDER — ACETAMINOPHEN 10 MG/ML IV SOLN
750.0000 mg | Freq: Once | INTRAVENOUS | Status: AC
  Administered 2016-09-30: 750 mg via INTRAVENOUS

## 2016-09-30 MED ORDER — ACETAMINOPHEN 650 MG RE SUPP: 650.0000 mg | Freq: Four times a day (QID) | RECTAL | Status: DC | PRN

## 2016-09-30 MED ORDER — PHENYLEPHRINE HCL-NACL 10-0.9 MG/250ML-% IV SOLN
INTRAVENOUS | Status: AC
  Filled 2016-09-30: qty 250

## 2016-09-30 MED ORDER — ONDANSETRON HCL 4 MG/2ML IJ SOLN
INTRAMUSCULAR | Status: DC | PRN
  Administered 2016-09-30: 4 mg via INTRAVENOUS

## 2016-09-30 MED ORDER — PHENOL 1.4 % MT LIQD: 1.0000 | OROMUCOSAL | Status: DC | PRN

## 2016-09-30 MED ORDER — ALLOPURINOL 100 MG PO TABS
100.0000 mg | ORAL_TABLET | Freq: Every day | ORAL | Status: DC
  Administered 2016-10-01 – 2016-10-04 (×4): 100 mg via ORAL
  Filled 2016-09-30 (×4): qty 1

## 2016-09-30 MED ORDER — PHENYLEPHRINE HCL 10 MG/ML IJ SOLN
INTRAMUSCULAR | Status: DC | PRN
  Administered 2016-09-30: 80 ug via INTRAVENOUS
  Administered 2016-09-30: 40 ug via INTRAVENOUS
  Administered 2016-09-30 (×2): 80 ug via INTRAVENOUS

## 2016-09-30 MED ORDER — LIDOCAINE 2% (20 MG/ML) 5 ML SYRINGE
INTRAMUSCULAR | Status: DC | PRN
  Administered 2016-09-30: 60 mg via INTRAVENOUS

## 2016-09-30 MED ORDER — SUGAMMADEX SODIUM 200 MG/2ML IV SOLN
INTRAVENOUS | Status: DC | PRN
  Administered 2016-09-30: 150 mg via INTRAVENOUS
  Administered 2016-09-30: 50 mg via INTRAVENOUS

## 2016-09-30 MED ORDER — FENTANYL CITRATE (PF) 100 MCG/2ML IJ SOLN
INTRAMUSCULAR | Status: DC | PRN
  Administered 2016-09-30 (×3): 25 ug via INTRAVENOUS

## 2016-09-30 MED ORDER — MENTHOL 3 MG MT LOZG: 1.0000 | LOZENGE | OROMUCOSAL | Status: DC | PRN

## 2016-09-30 MED ORDER — ACETAMINOPHEN 10 MG/ML IV SOLN
INTRAVENOUS | Status: AC
  Filled 2016-09-30: qty 100

## 2016-09-30 MED ORDER — LIDOCAINE 2% (20 MG/ML) 5 ML SYRINGE
INTRAMUSCULAR | Status: AC
  Filled 2016-09-30: qty 5

## 2016-09-30 MED ORDER — SUCCINYLCHOLINE CHLORIDE 200 MG/10ML IV SOSY
PREFILLED_SYRINGE | INTRAVENOUS | Status: AC
  Filled 2016-09-30: qty 10

## 2016-09-30 MED ORDER — DEXTROSE 5 % IV SOLN
500.0000 mg | Freq: Once | INTRAVENOUS | Status: AC
  Administered 2016-09-30: 500 mg via INTRAVENOUS
  Filled 2016-09-30: qty 550

## 2016-09-30 MED ORDER — FUROSEMIDE 20 MG PO TABS: 20.0000 mg | ORAL_TABLET | ORAL | Status: DC

## 2016-09-30 MED ORDER — CEFAZOLIN SODIUM-DEXTROSE 2-4 GM/100ML-% IV SOLN
INTRAVENOUS | Status: AC
  Filled 2016-09-30: qty 100

## 2016-09-30 MED ORDER — FUROSEMIDE 40 MG PO TABS: 40.0000 mg | ORAL_TABLET | ORAL | Status: DC

## 2016-09-30 MED ORDER — SODIUM CHLORIDE 0.9 % IV SOLN: Freq: Once | INTRAVENOUS | Status: DC

## 2016-09-30 MED ORDER — FENTANYL CITRATE (PF) 100 MCG/2ML IJ SOLN
25.0000 ug | INTRAMUSCULAR | Status: DC | PRN
  Administered 2016-09-30 (×4): 25 ug via INTRAVENOUS

## 2016-09-30 MED ORDER — FUROSEMIDE 20 MG PO TABS
20.0000 mg | ORAL_TABLET | Freq: Every day | ORAL | Status: DC
  Administered 2016-10-01: 20 mg via ORAL
  Filled 2016-09-30: qty 1

## 2016-09-30 MED ORDER — PHENYLEPHRINE HCL 10 MG/ML IJ SOLN
INTRAMUSCULAR | Status: AC
  Filled 2016-09-30: qty 1

## 2016-09-30 MED ORDER — 0.9 % SODIUM CHLORIDE (POUR BTL) OPTIME
TOPICAL | Status: DC | PRN
  Administered 2016-09-30: 1000 mL

## 2016-09-30 MED ORDER — STERILE WATER FOR IRRIGATION IR SOLN
Status: DC | PRN
  Administered 2016-09-30: 2000 mL

## 2016-09-30 MED ORDER — ENOXAPARIN SODIUM 30 MG/0.3ML ~~LOC~~ SOLN
30.0000 mg | SUBCUTANEOUS | Status: DC
  Administered 2016-10-01 – 2016-10-04 (×4): 30 mg via SUBCUTANEOUS
  Filled 2016-09-30 (×4): qty 0.3

## 2016-09-30 MED ORDER — ETOMIDATE 2 MG/ML IV SOLN
INTRAVENOUS | Status: AC
  Filled 2016-09-30: qty 10

## 2016-09-30 MED ORDER — SUCCINYLCHOLINE CHLORIDE 200 MG/10ML IV SOSY
PREFILLED_SYRINGE | INTRAVENOUS | Status: DC | PRN
  Administered 2016-09-30: 60 mg via INTRAVENOUS

## 2016-09-30 MED ORDER — DEXAMETHASONE SODIUM PHOSPHATE 10 MG/ML IJ SOLN
INTRAMUSCULAR | Status: DC | PRN
  Administered 2016-09-30: 5 mg via INTRAVENOUS

## 2016-09-30 MED ORDER — ACETAMINOPHEN 325 MG PO TABS
650.0000 mg | ORAL_TABLET | Freq: Four times a day (QID) | ORAL | Status: DC | PRN
  Administered 2016-10-01 – 2016-10-04 (×5): 650 mg via ORAL
  Filled 2016-09-30 (×5): qty 2

## 2016-09-30 MED ORDER — FENTANYL CITRATE (PF) 100 MCG/2ML IJ SOLN
INTRAMUSCULAR | Status: AC
  Filled 2016-09-30: qty 2

## 2016-09-30 MED ORDER — ALBUMIN HUMAN 5 % IV SOLN
INTRAVENOUS | Status: DC | PRN
  Administered 2016-09-30: 09:00:00 via INTRAVENOUS

## 2016-09-30 MED ORDER — ETOMIDATE 2 MG/ML IV SOLN
INTRAVENOUS | Status: DC | PRN
  Administered 2016-09-30: 10 mg via INTRAVENOUS

## 2016-09-30 MED ORDER — LACTATED RINGERS IV SOLN
INTRAVENOUS | Status: DC | PRN
  Administered 2016-09-30: 08:00:00 via INTRAVENOUS

## 2016-09-30 MED ORDER — ALBUMIN HUMAN 5 % IV SOLN
INTRAVENOUS | Status: AC
  Filled 2016-09-30: qty 250

## 2016-09-30 MED ORDER — ONDANSETRON HCL 4 MG/2ML IJ SOLN
INTRAMUSCULAR | Status: AC
  Filled 2016-09-30: qty 2

## 2016-09-30 MED ORDER — CEFAZOLIN SODIUM-DEXTROSE 2-4 GM/100ML-% IV SOLN: 2.0000 g | Freq: Four times a day (QID) | INTRAVENOUS | Status: DC

## 2016-09-30 MED ORDER — ROCURONIUM BROMIDE 50 MG/5ML IV SOSY
PREFILLED_SYRINGE | INTRAVENOUS | Status: DC | PRN
  Administered 2016-09-30: 5 mg via INTRAVENOUS
  Administered 2016-09-30: 20 mg via INTRAVENOUS
  Administered 2016-09-30: 5 mg via INTRAVENOUS
  Administered 2016-09-30: 10 mg via INTRAVENOUS

## 2016-09-30 SURGICAL SUPPLY — 69 items
BAG SPEC THK2 15X12 ZIP CLS (MISCELLANEOUS) ×2
BAG ZIPLOCK 12X15 (MISCELLANEOUS) ×3 IMPLANT
BIT DRILL 4.3 (BIT) IMPLANT
CABLE CERLAGE W/CRIMP 1.8 (Cable) ×6 IMPLANT
CAP LOCK NCB (Cap) ×10 IMPLANT
COVER MAYO STAND STRL (DRAPES) ×1 IMPLANT
COVER SURGICAL LIGHT HANDLE (MISCELLANEOUS) ×2 IMPLANT
DRAPE INCISE IOBAN 66X45 STRL (DRAPES) ×2 IMPLANT
DRAPE ORTHO SPLIT 77X108 STRL (DRAPES) ×4
DRAPE POUCH INSTRU U-SHP 10X18 (DRAPES) ×2 IMPLANT
DRAPE SHEET LG 3/4 BI-LAMINATE (DRAPES) ×2 IMPLANT
DRAPE SURG 17X11 SM STRL (DRAPES) ×2 IMPLANT
DRAPE SURG ORHT 6 SPLT 77X108 (DRAPES) ×2 IMPLANT
DRAPE U-SHAPE 47X51 STRL (DRAPES) ×2 IMPLANT
DRILL BIT 4.3 (BIT) ×2
DRSG EMULSION OIL 3X16 NADH (GAUZE/BANDAGES/DRESSINGS) ×1 IMPLANT
DRSG MEPILEX BORDER 4X12 (GAUZE/BANDAGES/DRESSINGS) ×1 IMPLANT
DRSG MEPILEX BORDER 4X4 (GAUZE/BANDAGES/DRESSINGS) ×1 IMPLANT
DRSG MEPILEX BORDER 4X8 (GAUZE/BANDAGES/DRESSINGS) ×1 IMPLANT
DRSG PAD ABDOMINAL 8X10 ST (GAUZE/BANDAGES/DRESSINGS) ×2 IMPLANT
DURAPREP 26ML APPLICATOR (WOUND CARE) ×3 IMPLANT
ELECT REM PT RETURN 15FT ADLT (MISCELLANEOUS) ×2 IMPLANT
EVACUATOR 1/8 PVC DRAIN (DRAIN) IMPLANT
FACESHIELD WRAPAROUND (MASK) ×8 IMPLANT
FACESHIELD WRAPAROUND OR TEAM (MASK) ×4 IMPLANT
GAUZE SPONGE 4X4 12PLY STRL (GAUZE/BANDAGES/DRESSINGS) ×3 IMPLANT
GLOVE BIO SURGEON STRL SZ7.5 (GLOVE) ×2 IMPLANT
GLOVE BIO SURGEON STRL SZ8 (GLOVE) ×2 IMPLANT
GLOVE BIOGEL PI IND STRL 8 (GLOVE) ×1 IMPLANT
GLOVE BIOGEL PI INDICATOR 8 (GLOVE) ×4
GLOVE ORTHO TXT STRL SZ7.5 (GLOVE) ×2 IMPLANT
GOWN STRL REUS W/TWL LRG LVL3 (GOWN DISPOSABLE) ×3 IMPLANT
GOWN STRL REUS W/TWL XL LVL3 (GOWN DISPOSABLE) ×3 IMPLANT
IMMOBILIZER KNEE 20 (SOFTGOODS) ×3 IMPLANT
IMMOBILIZER KNEE 20 THIGH 36 (SOFTGOODS) IMPLANT
K-WIRE 2.0 (WIRE) ×4
K-WIRE FXSTD 280X2XNS SS (WIRE) ×2
KIT BASIN OR (CUSTOM PROCEDURE TRAY) ×2 IMPLANT
KWIRE FXSTD 280X2XNS SS (WIRE) IMPLANT
LOCKPLATE CABLE BUTTON NCP HIP (Orthopedic Implant) ×3 IMPLANT
MANIFOLD NEPTUNE II (INSTRUMENTS) ×2 IMPLANT
NS IRRIG 1000ML POUR BTL (IV SOLUTION) ×4 IMPLANT
PACK TOTAL JOINT (CUSTOM PROCEDURE TRAY) ×2 IMPLANT
PADDING CAST COTTON 6X4 STRL (CAST SUPPLIES) ×2 IMPLANT
PLATE NCB 15H HIP (Plate) ×1 IMPLANT
POSITIONER SURGICAL ARM (MISCELLANEOUS) ×2 IMPLANT
SCREW 5.0 70MM (Screw) ×1 IMPLANT
SCREW CORT NCB SELFTAP 5.0X50 (Screw) ×1 IMPLANT
SCREW CORTICAL NCB 5.0X40 (Screw) ×1 IMPLANT
SCREW NCB 5.0X36MM (Screw) ×2 IMPLANT
SCREW NCB 5.0X38 (Screw) ×1 IMPLANT
SCREW NCB 5.0X46MM (Screw) ×2 IMPLANT
SCREW NCB 5.0X55MM (Screw) ×1 IMPLANT
SCREW UNI 5.0 12MM (Screw) ×2 IMPLANT
SCREW UNI CORTICAL 5.0X14MM (Screw) ×2 IMPLANT
SCREW UNICORTICAL 5.0X14 (Screw) IMPLANT
SPONGE LAP 18X18 X RAY DECT (DISPOSABLE) ×1 IMPLANT
SPONGE LAP 4X18 X RAY DECT (DISPOSABLE) ×2 IMPLANT
STAPLER VISISTAT 35W (STAPLE) ×2 IMPLANT
STRIP CLOSURE SKIN 1/2X4 (GAUZE/BANDAGES/DRESSINGS) IMPLANT
SUT MNCRL AB 4-0 PS2 18 (SUTURE) IMPLANT
SUT VIC AB 1 CT1 27 (SUTURE) ×6
SUT VIC AB 1 CT1 27XBRD ANTBC (SUTURE) ×3 IMPLANT
SUT VIC AB 2-0 CT1 27 (SUTURE) ×6
SUT VIC AB 2-0 CT1 TAPERPNT 27 (SUTURE) ×3 IMPLANT
SUT VLOC 180 0 24IN GS25 (SUTURE) ×3 IMPLANT
TOWEL OR 17X26 10 PK STRL BLUE (TOWEL DISPOSABLE) ×4 IMPLANT
TRAY FOLEY W/METER SILVER 16FR (SET/KITS/TRAYS/PACK) IMPLANT
WATER STERILE IRR 1500ML POUR (IV SOLUTION) ×2 IMPLANT

## 2016-09-30 NOTE — Progress Notes (Signed)
Pt returned to unit. Pt resting comfortably. No acute distress; no complaints; vss

## 2016-09-30 NOTE — H&P (View-Only) (Signed)
Reason for Consult:Left periprosthetic femur fracture Referring Physician: Dr. Bufford Spikes is an 83 y.o. female.  HPI: 83 yo female who had a mechanical fall today landing on her left side. Does not recall circumstances associated with the fall. Complains of left thigh pain, severe in nature. No other pain complaints. No numbness or tingling reported in leg or foot. Previous history of left hip hemiarthroplasty and left TKA. We are being consulted for fracture management  Past Medical History:  Diagnosis Date  . CAD (coronary artery disease)   . Chronic low back pain   . Contusion of foot, right 05/15/12  . Diverticulitis   . Dyslipidemia   . H/O: hysterectomy   . HTN (hypertension)   . Memory disturbance 05/2012   MMSE 26/30, intact Clock test  . Nonischemic cardiomyopathy ([place])   . Osteoporosis   . Pulmonary embolism ([place])   . Right knee pain 05/15/12  . Spinal stenosis   . Ulcer disease   . Weight loss     Past Surgical History:  Procedure Laterality Date  . APPENDECTOMY  2010  . BACK SURGERY    . BIV ICD GENERTAOR CHANGE OUT N/A 05/24/2011   Procedure: BIV ICD GENERTAOR CHANGE OUT;  Surgeon: Evans Lance, MD;  Location: [facility] CATH LAB;  Service: Cardiovascular;  Laterality: N/A;  . CATARACT EXTRACTION    . COLON RESECTION  2005  . CORONARY ANGIOPLASTY WITH STENT PLACEMENT  01/2003  . HEMICOLECTOMY    . HIP ARTHROPLASTY Left 04/05/2014   Procedure: ARTHROPLASTY BIPOLAR HIP;  Surgeon: Mauri Pole, MD;  Location: [facility] ORS;  Service: Orthopedics;  Laterality: Left;  . left shoulder replacement  1991   Dr. Collie Siad  . right shoulder repair  1985   Dr. Collie Siad  . rotator cuff debridement  1999   Dr. Maureen Ralphs    Family History  Problem Relation Age of Onset  . Coronary artery disease Sister   . Heart attack Father 10  . Heart attack Mother   . Other Mother        abdominal blockage    Social History:  reports that she has never smoked. She has never used smokeless  tobacco. She reports that she does not drink alcohol or use drugs.  Allergies:  Allergies  Allergen Reactions  . Arthrotec [Diclofenac-Misoprostol]   . Ibuprofen   . Lactose Intolerance (Gi)   . Milk-Related Compounds Nausea And Vomiting    Cheese  . Other     Grass and ragweed   . Oxycontin [Oxycodone Hcl]   . Pollen Extract     Medications: I have reviewed the patient's current medications.  Results for orders placed or performed during the hospital encounter of 09/29/16 (from the past 48 hour(s))  CBC with Differential     Status: Abnormal   Collection Time: 09/29/16  3:21 PM  Result Value Ref Range   WBC 11.8 (H) 4.0 - 10.5 K/uL   RBC 3.59 (L) 3.87 - 5.11 MIL/uL   Hemoglobin 12.5 12.0 - 15.0 g/dL   HCT 36.5 36.0 - 46.0 %   MCV 101.7 (H) 78.0 - 100.0 fL   MCH 34.8 (H) 26.0 - 34.0 pg   MCHC 34.2 30.0 - 36.0 g/dL   RDW 14.6 11.5 - 15.5 %   Platelets 203 150 - 400 K/uL   Neutrophils Relative % 75 %   Neutro Abs 8.9 (H) 1.7 - 7.7 K/uL   Lymphocytes Relative 17 %   Lymphs Abs  2.0 0.7 - 4.0 K/uL   Monocytes Relative 7 %   Monocytes Absolute 0.8 0.1 - 1.0 K/uL   Eosinophils Relative 1 %   Eosinophils Absolute 0.1 0.0 - 0.7 K/uL   Basophils Relative 0 %   Basophils Absolute 0.1 0.0 - 0.1 K/uL  Basic metabolic panel     Status: Abnormal   Collection Time: 09/29/16  3:21 PM  Result Value Ref Range   Sodium 141 135 - 145 mmol/L   Potassium 4.4 3.5 - 5.1 mmol/L   Chloride 103 101 - 111 mmol/L   CO2 28 22 - 32 mmol/L   Glucose, Bld 115 (H) 65 - 99 mg/dL   BUN 28 (H) 6 - 20 mg/dL   Creatinine, Ser 1.08 (H) 0.44 - 1.00 mg/dL   Calcium 9.0 8.9 - 10.3 mg/dL   GFR calc non Af Amer 43 (L) >60 mL/min   GFR calc Af Amer 49 (L) >60 mL/min    Comment: (NOTE) The eGFR has been calculated using the CKD EPI equation. This calculation has not been validated in all clinical situations. eGFR's persistently <60 mL/min signify possible Chronic Kidney Disease.    Anion gap 10 5 - 15     Dg Pelvis 1-2 Views  Result Date: 09/29/2016 CLINICAL DATA:  Left thigh pain following a fall today. EXAM: PELVIS - 1-2 VIEW COMPARISON:  04/05/2014. FINDINGS: Stable left hip bipolar prosthesis. Old, healed bilateral pubic fractures. No acute fracture or dislocation. 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 Lower lumbar spine degenerative changes. IMPRESSION: 1. No acute fracture or dislocation. 2. 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 Electronically Signed   By: Claudie Revering M.D.   On: 09/29/2016 15:00   Dg Tibia/fibula Left  Result Date: 09/29/2016 CLINICAL DATA:  Left leg pain following a fall today. EXAM: LEFT TIBIA AND FIBULA - 2 VIEW COMPARISON:  None. FINDINGS: Left total knee prosthesis. No fracture or dislocation seen. The images at the level of the ankle are suboptimally positioned due to a femur fracture. Tarsal degenerative changes are noted in the foot. IMPRESSION: No lower leg fracture or dislocation seen. Electronically Signed   By: Claudie Revering M.D.   On: 09/29/2016 15:04   Ct Head Wo Contrast  Result Date: 09/29/2016 CLINICAL DATA:  [place] [facility], denies loss of consciousness, history hypertension, non ischemic cardiomyopathy, coronary artery disease EXAM: CT HEAD WITHOUT CONTRAST CT CERVICAL SPINE WITHOUT CONTRAST TECHNIQUE: Multidetector CT imaging of the head and cervical spine was performed following the standard protocol without intravenous contrast. Multiplanar CT image reconstructions of the cervical spine were also generated. COMPARISON:  04/04/2014 FINDINGS: CT HEAD FINDINGS Brain: Generalized atrophy. Normal ventricular morphology. No midline shift or mass effect. Small vessel chronic ischemic changes of deep cerebral white matter. No intracranial hemorrhage, mass lesion, evidence of acute infarction, or extra-axial fluid collection. Vascular: Atherosclerotic calcifications of internal carotid arteries at skullbase Skull:  Osseous demineralization.  Calvaria intact. Sinuses/Orbits: Chronic opacification of portions of the RIGHT mastoid air cells. Remaining sinuses and LEFT mastoid air cells clear Other: N/A CT CERVICAL SPINE FINDINGS Alignment: Normal Skull base and vertebrae: Osseous demineralization. Visualized skullbase intact. Vertebral body heights maintained without fracture or bone destruction. Multilevel facet degenerative changes bilaterally. Ankylosis of C2-C3 facet joints. Soft tissues and spinal canal: Prevertebral soft tissues normal thickness. Atherosclerotic calcifications at the carotid bifurcations. Disc levels: Mild scattered disc space narrowing. Spinal canal patent. Beam hardening artifacts from patient shoulders. Upper chest: Visualized lung apices clear.  Other: N/A IMPRESSION: Atrophy with small vessel chronic ischemic changes of deep cerebral white matter. No acute intracranial abnormalities. Degenerative disc and facet disease changes cervical spine. No acute cervical spine abnormalities. Electronically Signed   By: Lavonia Dana M.D.   On: 09/29/2016 14:34   Ct Cervical Spine Wo Contrast  Result Date: 09/29/2016 CLINICAL DATA:  [place] today at [facility] community, denies loss of consciousness, history hypertension, non ischemic cardiomyopathy, coronary artery disease EXAM: CT HEAD WITHOUT CONTRAST CT CERVICAL SPINE WITHOUT CONTRAST TECHNIQUE: Multidetector CT imaging of the head and cervical spine was performed following the standard protocol without intravenous contrast. Multiplanar CT image reconstructions of the cervical spine were also generated. COMPARISON:  04/04/2014 FINDINGS: CT HEAD FINDINGS Brain: Generalized atrophy. Normal ventricular morphology. No midline shift or mass effect. Small vessel chronic ischemic changes of deep cerebral white matter. No intracranial hemorrhage, mass lesion, evidence of acute infarction, or extra-axial fluid collection. Vascular: Atherosclerotic calcifications of  internal carotid arteries at skullbase Skull: Osseous demineralization.  Calvaria intact. Sinuses/Orbits: Chronic opacification of portions of the RIGHT mastoid air cells. Remaining sinuses and LEFT mastoid air cells clear Other: N/A CT CERVICAL SPINE FINDINGS Alignment: Normal Skull base and vertebrae: Osseous demineralization. Visualized skullbase intact. Vertebral body heights maintained without fracture or bone destruction. Multilevel facet degenerative changes bilaterally. Ankylosis of C2-C3 facet joints. Soft tissues and spinal canal: Prevertebral soft tissues normal thickness. Atherosclerotic calcifications at the carotid bifurcations. Disc levels: Mild scattered disc space narrowing. Spinal canal patent. Beam hardening artifacts from patient shoulders. Upper chest: Visualized lung apices clear. Other: N/A IMPRESSION: Atrophy with small vessel chronic ischemic changes of deep cerebral white matter. No acute intracranial abnormalities. Degenerative disc and facet disease changes cervical spine. No acute cervical spine abnormalities. Electronically Signed   By: Lavonia Dana M.D.   On: 09/29/2016 14:34   Dg Femur Min 2 Views Left  Result Date: 09/29/2016 CLINICAL DATA:  Left thigh pain following a fall today. EXAM: LEFT FEMUR 2 VIEWS COMPARISON:  None. FINDINGS: Oblique fracture of the distal femoral shaft with almost 1 shaft width of medial displacement and medial angulation of the distal fragment. There is also posterior angulation of the distal fragment. The fracture is mildly comminuted with 4 cm of overlapping of the fragments. Left knee and left hip prostheses are noted as well as atheromatous arterial calcifications. IMPRESSION: Distal femoral shaft fracture, as described above. Electronically Signed   By: Claudie Revering M.D.   On: 09/29/2016 15:03    ROS Blood pressure (!) 148/79, pulse 84, temperature 98.5 F (36.9 C), temperature source Oral, resp. rate 16, height 5' 2"  (1.575 m), weight 72.6 kg  (160 lb), SpO2 100 %. Physical Exam Physical Examination: General appearance - no distress, cooperative, alert, ill appearing Mental status - alert, oriented to person, place. LLE shortened without rotational deformity. EHL/FHL/TA/gastroc intact; sensation intact; foot warm and pink; pulses trace bilaterally   Assessment/Plan: Left distal femur periprosthetic fracture- This is a very complex injury in a medically complex patient. The fracture is a long spiral between a total knee and a hip stem. It is not amenable to an IM nail because of the hip stem. Surgical fixation will require a large incision and dissection with plate fixation. This will be a complex procedure but should hopefully allow for better pain control post-operatively and allow for transfers. Will be non to partial weight bearing for an extended time. I have spoken with family members and told them that it is highly unlikely that  she will ever be able to regain her level of function that she had prior to the fracture. The other option is non-operative treatment with a brace but it is less likely to achieve adequate pain control and will not allow for easier mobilization in comparison to surgical treatment. The upside of non-operative management is avoiding the risks of surgery, which are not to be discounted in a large operation in a patient with significant co-morbidities. I had a long discussion with family members presenting the pros and cons of each option and counseling them through this very difficult decision. We discussed the procedure, risks and potential complications associated with each option and they have elected to pursue surgical treatment. We will proceed tomorrow.  Gearlean Alf 09/29/2016, 6:42 PM

## 2016-09-30 NOTE — Op Note (Signed)
[name], [name] NO.:  [id number]  MEDICAL RECORD NO.:  [id number]  LOCATION:  1603                         FACILITY:  [facility]  PHYSICIAN:  Ollen Gross, M.D.    DATE OF BIRTH:  22-May-1920  DATE OF PROCEDURE:  09/30/2016 DATE OF DISCHARGE:                              OPERATIVE REPORT   PREOPERATIVE DIAGNOSIS:  Left distal femur periprosthetic femur fracture.  POSTOPERATIVE DIAGNOSIS:  Left distal femur periprosthetic femur fracture.  PROCEDURE:  Open reduction and internal fixation of left periprosthetic femur fracture.  SURGEON:  Ollen Gross, M.D.  ASSISTANT:  Alexzandrew L. Perkins, P.A.C.  ANESTHESIA:  General.  ESTIMATED BLOOD LOSS:  250 mL.  DRAINS:  Hemovac x1.  COMPLICATIONS:  None.  CONDITION:  Stable to recovery.  BRIEF CLINICAL NOTE:  [name] is a 83 year old female, history of a previous left total knee arthroplasty, and left hip hemiarthroplasty. She had a fall yesterday sustaining a spiral distal periprosthetic femur fracture, which went from the supracondylar region all the way up to the distal tip of her femoral stem.  It was felt that this was not amenable to retrograde nail and we would have to provide plate fixation.  She presents now for open reduction and internal fixation of the left femur periprosthetic fracture.  PROCEDURE IN DETAIL:  After successful administration of general anesthetic, the patient is placed in the right lateral decubitus position with the left side up and held with the hip positioners.  Her left lower extremity is isolated from her perineum with plastic drapes and then prepped and draped in the usual sterile fashion.  A post long lateral incision was made over the lateral aspect of the femur starting at the knee and coursing up to proximal third of the thigh.  The skin was cut with a 10 blade through subcutaneous tissue to the level of the fascia lata, which was incised in line with the skin  incision.  The fascia of the vastus lateralis was also incised and the muscle split to reveal the femur.  This is a long spiral fracture.  We were able to reduce it and hold it with clamps and then I placed a 3 Zimmer cables for provisional fixation of this.  It was a near anatomic reduction.  We then utilized the 20 hole Zimmer cable plate, which had a locking capability.  It is the distal femoral plate.  I placed it at the appropriate configuration on the femur and placed two K-wires through the plate to hold it in place while I placed the screws.  Approximately 5 screws were placed distally into the distal femur, 4 of which were locking screws.  We had excellent purchase there.  I then placed two cortical screws through the midportion of the plate which went through both cortices of the femur with excellent purchase.  We then placed unicortical locking screws to the proximal 3 holes of the plate to lock it down proximally.  I also passed 3 cables through the plate to gain more points of fixation proximally.  We took place 2 more screws distally and then took x-ray in AP and lateral plane showing near anatomic reduction of  this comminuted fracture with excellent position of the hardware.  We then placed locking caps over the screws that were placed to be locking screws.  The wound was then copiously irrigated with saline solution.  The fascia of the vastus lateralis was closed with a running #1 V-Loc suture.  The fascia lata was closed over Hemovac drain with a running #1 V-Loc suture.  Subcu was closed in multiple layers with V-Loc and 2-0 and then the skin was closed with staples. The drains hooked to suction, incision cleaned and dried, and a sterile dressing applied.  She was awakened and transported to recovery in stable condition.  Note that a surgical assistant is a medical necessity for this very complex surgical procedure.  The assistant was necessary for retraction of vital  neurovascular structures and for reduction of the fracture and for maintenance of the reduction while we were placing the hardware.     Ollen Gross, M.D.     FA/MEDQ  D:  09/30/2016  T:  09/30/2016  Job:  [id number]

## 2016-09-30 NOTE — Brief Op Note (Signed)
09/29/2016 - 09/30/2016  10:07 AM  PATIENT:  Freddi R Roh  83 y.o. female  PRE-OPERATIVE DIAGNOSIS:  left distal femur peri-prosthetic fracture  POST-OPERATIVE DIAGNOSIS:  left distal femur peri-prosthetic fracture  PROCEDURE:  Procedure(s): OPEN REDUCTION INTERNAL FIXATION PERIPROSTHETIC HIP FRACTURE (Left)  SURGEON:  Surgeon(s) and Role:    Ollen Gross, MD - Primary  PHYSICIAN ASSISTANT:   ASSISTANTS: Avel Peace, PA-C   ANESTHESIA:   general  EBL:  Total I/O In: 250 [IV Piggyback:250] Out: 350 [Urine:100; Blood:250]  BLOOD ADMINISTERED:none  DRAINS: (Medium) Hemovact drain(s) in the left thigh with  Suction Open   LOCAL MEDICATIONS USED: None  COUNTS:  YES  TOURNIQUET:  * No tourniquets in log *  DICTATION: .Other Dictation: Dictation Number [id number]  PLAN OF CARE: Admit to inpatient   PATIENT DISPOSITION:  PACU - hemodynamically stable.

## 2016-09-30 NOTE — Progress Notes (Signed)
Patient left the unit at approximately 0640 for the OR. Family member at bedside.

## 2016-09-30 NOTE — Anesthesia Preprocedure Evaluation (Addendum)
Anesthesia Evaluation  Patient identified by MRN, date of birth, ID band Patient awake    Reviewed: Allergy & Precautions, H&P , NPO status , Patient's Chart, lab work & pertinent test results, reviewed documented beta blocker date and time   Airway Mallampati: II  TM Distance: >3 FB Neck ROM: Full    Dental no notable dental hx. (+) Teeth Intact, Dental Advisory Given   Pulmonary neg pulmonary ROS,    Pulmonary exam normal breath sounds clear to auscultation       Cardiovascular hypertension, Pt. on medications and Pt. on home beta blockers + CAD   Rhythm:Regular Rate:Normal     Neuro/Psych negative neurological ROS  negative psych ROS   GI/Hepatic negative GI ROS, Neg liver ROS,   Endo/Other  negative endocrine ROS  Renal/GU negative Renal ROS  negative genitourinary   Musculoskeletal  (+) Arthritis , Osteoarthritis,    Abdominal   Peds  Hematology negative hematology ROS (+) anemia ,   Anesthesia Other Findings   Reproductive/Obstetrics negative OB ROS                            Anesthesia Physical Anesthesia Plan  ASA: III  Anesthesia Plan: General   Post-op Pain Management:    Induction: Intravenous  PONV Risk Score and Plan: 4 or greater and Ondansetron, Dexamethasone and Treatment may vary due to age or medical condition  Airway Management Planned: Oral ETT  Additional Equipment:   Intra-op Plan:   Post-operative Plan: Extubation in OR  Informed Consent: I have reviewed the patients History and Physical, chart, labs and discussed the procedure including the risks, benefits and alternatives for the proposed anesthesia with the patient or authorized representative who has indicated his/her understanding and acceptance.   Dental advisory given  Plan Discussed with: CRNA  Anesthesia Plan Comments: (No CPR. No defibrillation.)       Anesthesia Quick Evaluation

## 2016-09-30 NOTE — Anesthesia Postprocedure Evaluation (Signed)
Anesthesia Post Note  Patient: [name]  Procedure(s) Performed: Procedure(s) (LRB): OPEN REDUCTION INTERNAL FIXATION PERIPROSTHETIC HIP FRACTURE (Left)     Patient location during evaluation: PACU Anesthesia Type: General Level of consciousness: awake and alert Pain management: pain level controlled Vital Signs Assessment: post-procedure vital signs reviewed and stable Respiratory status: spontaneous breathing, nonlabored ventilation, respiratory function stable and patient connected to nasal cannula oxygen Cardiovascular status: blood pressure returned to baseline and stable Postop Assessment: no signs of nausea or vomiting Anesthetic complications: no    Last Vitals:  Vitals:   09/30/16 1130 09/30/16 1145  BP: (!) 95/55 (!) 95/53  Pulse: 73 69  Resp: 12 12  Temp: 36.8 C 36.9 C    Last Pain:  Vitals:   09/30/16 1145  TempSrc:   PainSc: Asleep    LLE Motor Response: Purposeful movement (09/30/16 1145) LLE Sensation: Full sensation (09/30/16 1145) RLE Motor Response: Purposeful movement (09/30/16 1145) RLE Sensation: Full sensation (09/30/16 1145)      Alhaji Mcneal,W. EDMOND

## 2016-09-30 NOTE — Progress Notes (Signed)
PROGRESS NOTE    [name]  WUJ:[id number] DOB: 06-20-1920 DOA: 09/29/2016 PCP: Merlene Laughter, MD    Brief Narrative:  This is a 83 year old female with moderate to advanced dementia who presented to the hospital after a mechanical fall. No evidence of syncope, patient has limited mobility due to her dementia and ambulatory dysfunction. No angina on exertion or heart failure symptoms. Blood pressure 138/81, heart rate 86, respiratory rate 20, oxygen saturation 98%. She does have mild pallor, dry oral mucosa, lungs clear to auscultation, heart S1-S2 present with no murmurs or gallops, abdomen is soft, lower extremities with no edema, positive shortening left lower extremity. Sodium 141, potassium 4.4, chloride 103, bicarbonate 28, glucose 115, BUN 28, creatinine 1.08, white count 11.8, hemoglobin 12.5, hematocrit 36.5, platelets 203. Head and neck CT with no acute abnormalities, x-rays with distal femoral shaft fracture.   Working diagnosis left thigh pain due to traumatic distal femoral shaft fracture   Assessment & Plan:   Principal Problem:   Periprosthetic supracondylar fracture of femur Active Problems:   Hip fracture (HCC)   1. Distal femoral shaft fracture, on the left. Patient was intervened by orthopedics with an open reduction and internal fixation of left periprosthetic femur fracture. Will continue pain control and dvt prophylaxis, will follow on orthopedic recommendations.   2. Hypertension. On carvedilol,will resume low dose of furosemide, hold on IV fluids, patient tolerating well po.  3. Symptomatic bradycardia, sinus node dysfunction, status post pacemaker insertion.. Metronic DDD, PM. Stable heart rate, will continue carvedilol.   4. Coronary artery disease.  Patient not on aspirin at home. Currently asymptomatic.  5. Nonischemic cardiomyopathy with systolic heart failure. Presumed ejection fraction of left ventricle less than 35%.  Continue Coreg for b  blockade, will resume low dose furosemide. No signs of decompensation.   6. Depression/ dementia. On sertraline, neuro checks per unit protocol. No agitation, positive mild confusion, patient's family at the bedside.    DVT prophylaxis: enoxaparin  Code Status: dnr Family Communication:  Disposition Plan: snf    Consultants:   Orthopedics    Procedures:  OPEN REDUCTION INTERNAL FIXATION PERIPROSTHETIC HIP FRACTURE (Left)  Antimicrobials:    Subjective: Patient tolerated well surgical procedure, no nausea or vomiting, positive moderate pain on the right lower extremity, no chest pain or dyspnea.   Objective: Vitals:   09/30/16 1130 09/30/16 1145 09/30/16 1200 09/30/16 1259  BP: (!) 95/55 (!) 95/53 (!) 97/52 (!) 100/45  Pulse: 73 69 77 70  Resp: 12 12 12 13   Temp: 98.3 F (36.8 C) 98.4 F (36.9 C) 99.4 F (37.4 C) 98.4 F (36.9 C)  TempSrc:    Oral  SpO2: 97% 95% 100% 100%  Weight:      Height:        Intake/Output Summary (Last 24 hours) at 09/30/16 1308 Last data filed at 09/30/16 1142  Gross per 24 hour  Intake           2530.9 ml  Output              800 ml  Net           1730.9 ml   Filed Weights   09/29/16 1745  Weight: 72.6 kg (160 lb)    Examination:  General exam: deconditioned E ENT: mild pallor, no icterus, oral mucosa moist.  Respiratory system: Clear to auscultation. Respiratory effort normal. No wheezing, rales or rhonchi.  Cardiovascular system: S1 & S2 heard, RRR. No JVD, murmurs, rubs, gallops  or clicks. No pedal edema. Gastrointestinal system: Abdomen is nondistended, soft and nontender. No organomegaly or masses felt. Normal bowel sounds heard. Central nervous system: Alert and oriented. No focal neurological deficits. Extremities: Symmetric 5 x 5 power. Left  lower extremity on a brace.  Skin: No rashes, lesions or ulcers     Data Reviewed: I have personally reviewed following labs and imaging studies  CBC:  Recent Labs Lab  09/29/16 1521 09/30/16 0538  WBC 11.8* 11.4*  NEUTROABS 8.9*  --   HGB 12.5 11.0*  HCT 36.5 32.8*  MCV 101.7* 101.2*  PLT 203 174   Basic Metabolic Panel:  Recent Labs Lab 09/29/16 1521 09/30/16 0538  NA 141 140  K 4.4 4.6  CL 103 102  CO2 28 28  GLUCOSE 115* 135*  BUN 28* 29*  CREATININE 1.08* 1.18*  CALCIUM 9.0 8.5*   GFR: Estimated Creatinine Clearance: 27.2 mL/min (A) (by C-G formula based on SCr of 1.18 mg/dL (H)). Liver Function Tests:  Recent Labs Lab 09/30/16 0538  AST 28  ALT 8*  ALKPHOS 57  BILITOT 1.4*  PROT 6.5  ALBUMIN 3.6   No results for input(s): LIPASE, AMYLASE in the last 168 hours. No results for input(s): AMMONIA in the last 168 hours. Coagulation Profile: No results for input(s): INR, PROTIME in the last 168 hours. Cardiac Enzymes: No results for input(s): CKTOTAL, CKMB, CKMBINDEX, TROPONINI in the last 168 hours. BNP (last 3 results) No results for input(s): PROBNP in the last 8760 hours. HbA1C: No results for input(s): HGBA1C in the last 72 hours. CBG: No results for input(s): GLUCAP in the last 168 hours. Lipid Profile: No results for input(s): CHOL, HDL, LDLCALC, TRIG, CHOLHDL, LDLDIRECT in the last 72 hours. Thyroid Function Tests: No results for input(s): TSH, T4TOTAL, FREET4, T3FREE, THYROIDAB in the last 72 hours. Anemia Panel: No results for input(s): VITAMINB12, FOLATE, FERRITIN, TIBC, IRON, RETICCTPCT in the last 72 hours. Sepsis Labs: No results for input(s): PROCALCITON, LATICACIDVEN in the last 168 hours.  Recent Results (from the past 240 hour(s))  Surgical PCR screen     Status: None   Collection Time: 09/30/16  3:33 AM  Result Value Ref Range Status   MRSA, PCR NEGATIVE NEGATIVE Final   Staphylococcus aureus NEGATIVE NEGATIVE Final    Comment:        The Xpert SA Assay (FDA approved for NASAL specimens in patients over 60 years of age), is one component of a comprehensive surveillance program.  Test  performance has been validated by [facility] for patients greater than or equal to 33 year old. It is not intended to diagnose infection nor to guide or monitor treatment.          Radiology Studies: Dg Pelvis 1-2 Views  Result Date: 09/29/2016 CLINICAL DATA:  Left thigh pain following a fall today. EXAM: PELVIS - 1-2 VIEW COMPARISON:  04/05/2014. FINDINGS: Stable left hip bipolar prosthesis. Old, healed bilateral pubic fractures. No acute fracture or dislocation. 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 Lower lumbar spine degenerative changes. IMPRESSION: 1. No acute fracture or dislocation. 2. 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 Electronically Signed   By: Beckie Salts M.D.   On: 09/29/2016 15:00   Dg Tibia/fibula Left  Result Date: 09/29/2016 CLINICAL DATA:  Left leg pain following a fall today. EXAM: LEFT TIBIA AND FIBULA - 2 VIEW COMPARISON:  None. FINDINGS: Left total knee prosthesis. No fracture or dislocation seen. The images at the level of  the ankle are suboptimally positioned due to a femur fracture. Tarsal degenerative changes are noted in the foot. IMPRESSION: No lower leg fracture or dislocation seen. Electronically Signed   By: Beckie Salts M.D.   On: 09/29/2016 15:04   Ct Head Wo Contrast  Result Date: 09/29/2016 CLINICAL DATA:  Larey Seat [facility], denies loss of consciousness, history hypertension, non ischemic cardiomyopathy, coronary artery disease EXAM: CT HEAD WITHOUT CONTRAST CT CERVICAL SPINE WITHOUT CONTRAST TECHNIQUE: Multidetector CT imaging of the head and cervical spine was performed following the standard protocol without intravenous contrast. Multiplanar CT image reconstructions of the cervical spine were also generated. COMPARISON:  04/04/2014 FINDINGS: CT HEAD FINDINGS Brain: Generalized atrophy. Normal ventricular morphology. No midline shift or mass effect. Small vessel chronic ischemic changes of deep cerebral white  matter. No intracranial hemorrhage, mass lesion, evidence of acute infarction, or extra-axial fluid collection. Vascular: Atherosclerotic calcifications of internal carotid arteries at skullbase Skull: Osseous demineralization.  Calvaria intact. Sinuses/Orbits: Chronic opacification of portions of the RIGHT mastoid air cells. Remaining sinuses and LEFT mastoid air cells clear Other: N/A CT CERVICAL SPINE FINDINGS Alignment: Normal Skull base and vertebrae: Osseous demineralization. Visualized skullbase intact. Vertebral body heights maintained without fracture or bone destruction. Multilevel facet degenerative changes bilaterally. Ankylosis of C2-C3 facet joints. Soft tissues and spinal canal: Prevertebral soft tissues normal thickness. Atherosclerotic calcifications at the carotid bifurcations. Disc levels: Mild scattered disc space narrowing. Spinal canal patent. Beam hardening artifacts from patient shoulders. Upper chest: Visualized lung apices clear. Other: N/A IMPRESSION: Atrophy with small vessel chronic ischemic changes of deep cerebral white matter. No acute intracranial abnormalities. Degenerative disc and facet disease changes cervical spine. No acute cervical spine abnormalities. Electronically Signed   By: Ulyses Southward M.D.   On: 09/29/2016 14:34   Ct Cervical Spine Wo Contrast  Result Date: 09/29/2016 CLINICAL DATA:  Larey Seat today at [facility] community, denies loss of consciousness, history hypertension, non ischemic cardiomyopathy, coronary artery disease EXAM: CT HEAD WITHOUT CONTRAST CT CERVICAL SPINE WITHOUT CONTRAST TECHNIQUE: Multidetector CT imaging of the head and cervical spine was performed following the standard protocol without intravenous contrast. Multiplanar CT image reconstructions of the cervical spine were also generated. COMPARISON:  04/04/2014 FINDINGS: CT HEAD FINDINGS Brain: Generalized atrophy. Normal ventricular morphology. No midline shift or mass effect. Small vessel chronic  ischemic changes of deep cerebral white matter. No intracranial hemorrhage, mass lesion, evidence of acute infarction, or extra-axial fluid collection. Vascular: Atherosclerotic calcifications of internal carotid arteries at skullbase Skull: Osseous demineralization.  Calvaria intact. Sinuses/Orbits: Chronic opacification of portions of the RIGHT mastoid air cells. Remaining sinuses and LEFT mastoid air cells clear Other: N/A CT CERVICAL SPINE FINDINGS Alignment: Normal Skull base and vertebrae: Osseous demineralization. Visualized skullbase intact. Vertebral body heights maintained without fracture or bone destruction. Multilevel facet degenerative changes bilaterally. Ankylosis of C2-C3 facet joints. Soft tissues and spinal canal: Prevertebral soft tissues normal thickness. Atherosclerotic calcifications at the carotid bifurcations. Disc levels: Mild scattered disc space narrowing. Spinal canal patent. Beam hardening artifacts from patient shoulders. Upper chest: Visualized lung apices clear. Other: N/A IMPRESSION: Atrophy with small vessel chronic ischemic changes of deep cerebral white matter. No acute intracranial abnormalities. Degenerative disc and facet disease changes cervical spine. No acute cervical spine abnormalities. Electronically Signed   By: Ulyses Southward M.D.   On: 09/29/2016 14:34   Dg Femur Min 2 Views Left  Result Date: 09/30/2016 CLINICAL DATA:  Internal fixation EXAM: LEFT FEMUR 2 VIEWS COMPARISON:  09/29/2016  FINDINGS: Changes of prior left hip and left knee replacement. Plate, screw and cerclage fixation noted across the distal femoral shaft fracture. Near anatomic alignment. IMPRESSION: Internal fixation of the distal femoral fracture with near anatomic alignment. Electronically Signed   By: Charlett Nose M.D.   On: 09/30/2016 09:47   Dg Femur Min 2 Views Left  Result Date: 09/29/2016 CLINICAL DATA:  Left thigh pain following a fall today. EXAM: LEFT FEMUR 2 VIEWS COMPARISON:  None.  FINDINGS: Oblique fracture of the distal femoral shaft with almost 1 shaft width of medial displacement and medial angulation of the distal fragment. There is also posterior angulation of the distal fragment. The fracture is mildly comminuted with 4 cm of overlapping of the fragments. Left knee and left hip prostheses are noted as well as atheromatous arterial calcifications. IMPRESSION: Distal femoral shaft fracture, as described above. Electronically Signed   By: Beckie Salts M.D.   On: 09/29/2016 15:03        Scheduled Meds: . [START ON 10/01/2016] allopurinol  100 mg Oral Daily  . carvedilol  3.125 mg Oral BID WC  . docusate  100 mg Oral BID  . enoxaparin (LOVENOX) injection  40 mg Subcutaneous Q24H  . famotidine  20 mg Oral QHS  . fentaNYL      . [START ON 10/02/2016] furosemide  20 mg Oral QODAY  . [START ON 10/01/2016] furosemide  40 mg Oral QODAY  . polyethylene glycol  17 g Oral Daily  . sertraline  25 mg Oral Daily  . vitamin B-12  1,000 mcg Oral Daily   Continuous Infusions: . sodium chloride 50 mL/hr at 09/30/16 0008  . acetaminophen Stopped (09/30/16 1104)  .  ceFAZolin (ANCEF) IV    . phenylephrine       LOS: 1 day      Coralie Keens, MD [facility] Pager [phone]  If 7PM-7AM, please contact night-coverage [email or web address] Password [email or web address] 09/30/2016, 1:08 PM

## 2016-09-30 NOTE — Interval H&P Note (Signed)
History and Physical Interval Note:  09/30/2016 7:00 AM  [name]  has presented today for surgery, with the diagnosis of left distal femur peri-prosthetic fracture  The various methods of treatment have been discussed with the patient and family. After consideration of risks, benefits and other options for treatment, the patient has consented to  Procedure(s): OPEN REDUCTION INTERNAL FIXATION HIP (Left) as a surgical intervention .  The patient's history has been reviewed, patient examined, no change in status, stable for surgery.  I have reviewed the patient's chart and labs.  Questions were answered to the patient's satisfaction.     Loanne Drilling

## 2016-09-30 NOTE — Progress Notes (Signed)
Spoke with Kenard Gower PA regarding transfusion order; explained that transfusion order appears to have been given in OR but not administered. Drew PA states hold off on transfusing now. But, does want H&H ordered now.

## 2016-09-30 NOTE — Anesthesia Procedure Notes (Signed)
Procedure Name: Intubation Date/Time: 09/30/2016 7:51 AM Performed by: West Pugh Pre-anesthesia Checklist: Patient identified, Emergency Drugs available, Suction available, Patient being monitored and Timeout performed Patient Re-evaluated:Patient Re-evaluated prior to induction Oxygen Delivery Method: Circle system utilized Preoxygenation: Pre-oxygenation with 100% oxygen Induction Type: IV induction and Cricoid Pressure applied Ventilation: Mask ventilation without difficulty Laryngoscope Size: Mac and 3 Grade View: Grade I Tube type: Oral Tube size: 7.0 mm Number of attempts: 1 Airway Equipment and Method: Stylet Placement Confirmation: ETT inserted through vocal cords under direct vision,  positive ETCO2,  CO2 detector and breath sounds checked- equal and bilateral Secured at: 21 cm Tube secured with: Tape Dental Injury: Teeth and Oropharynx as per pre-operative assessment

## 2016-09-30 NOTE — Transfer of Care (Signed)
Immediate Anesthesia Transfer of Care Note  Patient: Sruti R Shipper  Procedure(s) Performed: Procedure(s): OPEN REDUCTION INTERNAL FIXATION PERIPROSTHETIC HIP FRACTURE (Left)  Patient Location: PACU  Anesthesia Type:General  Level of Consciousness:  sedated, patient cooperative and responds to stimulation  Airway & Oxygen Therapy:Patient Spontanous Breathing and Patient connected to face mask oxgen  Post-op Assessment:  Report given to PACU RN and Post -op Vital signs reviewed and stable  Post vital signs:  Reviewed and stable  Last Vitals:  Vitals:   09/29/16 2231 09/30/16 0523  BP: 105/61 121/65  Pulse: 70 72  Resp: 14 15  Temp: 37 C 36.9 C    Complications: No apparent anesthesia complications

## 2016-09-30 NOTE — Progress Notes (Signed)
Foley cath inserted at approximately 2330 after second attempt.

## 2016-09-30 NOTE — Progress Notes (Signed)
PHARMACY NOTE:  ANTIMICROBIAL RENAL DOSAGE ADJUSTMENT  Current antimicrobial regimen includes a mismatch between antimicrobial dosage and estimated renal function.  As per policy approved by the Pharmacy & Therapeutics and Medical Executive Committees, the antimicrobial dosage will be adjusted accordingly.  Current antimicrobial dosage:  Cefazolin 2g IV q6h x 2 doses post-operatively  Indication: Surgical prophylaxis  Renal Function:  Estimated Creatinine Clearance: 27.2 mL/min (A) (by C-G formula based on SCr of 1.18 mg/dL (H)). []      On intermittent HD, scheduled: []      On CRRT    Antimicrobial dosage has been changed to:  Cefazolin 1g IV q12h x 2 doses   Thank you for allowing pharmacy to be a part of this patient's care.  Jamse Mead, [facility] 09/30/2016 12:32 PM

## 2016-10-01 ENCOUNTER — Encounter (HOSPITAL_COMMUNITY): Payer: Self-pay | Admitting: Orthopedic Surgery

## 2016-10-01 DIAGNOSIS — S72402A Unspecified fracture of lower end of left femur, initial encounter for closed fracture: Secondary | ICD-10-CM

## 2016-10-01 DIAGNOSIS — Z96659 Presence of unspecified artificial knee joint: Secondary | ICD-10-CM

## 2016-10-01 DIAGNOSIS — M978XXA Periprosthetic fracture around other internal prosthetic joint, initial encounter: Secondary | ICD-10-CM

## 2016-10-01 LAB — CBC WITH DIFFERENTIAL/PLATELET
BASOS ABS: 0 10*3/uL (ref 0.0–0.1)
BASOS PCT: 0 %
EOS PCT: 0 %
Eosinophils Absolute: 0 10*3/uL (ref 0.0–0.7)
HEMATOCRIT: 27.8 % — AB (ref 36.0–46.0)
Hemoglobin: 9 g/dL — ABNORMAL LOW (ref 12.0–15.0)
LYMPHS PCT: 9 %
Lymphs Abs: 1.2 10*3/uL (ref 0.7–4.0)
MCH: 31 pg (ref 26.0–34.0)
MCHC: 32.4 g/dL (ref 30.0–36.0)
MCV: 95.9 fL (ref 78.0–100.0)
Monocytes Absolute: 1.9 10*3/uL — ABNORMAL HIGH (ref 0.1–1.0)
Monocytes Relative: 13 %
Neutro Abs: 11.1 10*3/uL — ABNORMAL HIGH (ref 1.7–7.7)
Neutrophils Relative %: 78 %
PLATELETS: 157 10*3/uL (ref 150–400)
RBC: 2.9 MIL/uL — AB (ref 3.87–5.11)
RDW: 13.7 % (ref 11.5–15.5)
WBC: 14.2 10*3/uL — AB (ref 4.0–10.5)

## 2016-10-01 LAB — BASIC METABOLIC PANEL
ANION GAP: 10 (ref 5–15)
BUN: 35 mg/dL — AB (ref 6–20)
CALCIUM: 8.2 mg/dL — AB (ref 8.9–10.3)
CO2: 27 mmol/L (ref 22–32)
Chloride: 101 mmol/L (ref 101–111)
Creatinine, Ser: 1.5 mg/dL — ABNORMAL HIGH (ref 0.44–1.00)
GFR calc Af Amer: 33 mL/min — ABNORMAL LOW (ref >60)
GFR, EST NON AFRICAN AMERICAN: 29 mL/min — AB (ref >60)
Glucose, Bld: 126 mg/dL — ABNORMAL HIGH (ref 65–99)
POTASSIUM: 4.4 mmol/L (ref 3.5–5.1)
SODIUM: 138 mmol/L (ref 135–145)

## 2016-10-01 MED ORDER — DOCUSATE SODIUM 100 MG PO CAPS
100.0000 mg | ORAL_CAPSULE | Freq: Two times a day (BID) | ORAL | Status: DC
  Administered 2016-10-01 – 2016-10-04 (×6): 100 mg via ORAL
  Filled 2016-10-01 (×6): qty 1

## 2016-10-01 MED ORDER — ALPRAZOLAM 0.25 MG PO TABS
0.2500 mg | ORAL_TABLET | Freq: Once | ORAL | Status: AC
  Administered 2016-10-01: 0.25 mg via ORAL
  Filled 2016-10-01: qty 1

## 2016-10-01 NOTE — Clinical Social Work Note (Signed)
Clinical Social Work Assessment  Patient Details  Name: [name] MRN: [id number] Date of Birth: 07-31-1920  Date of referral:  10/01/16               Reason for consult:  Facility Placement, Discharge Planning                Permission sought to share information with:  Facility Art therapist granted to share information::  Yes, Verbal Permission Granted  Name::        Agency::     Relationship::     Contact Information:     Housing/Transportation Living arrangements for the past 2 months:  [place] of Information:  Adult Children Patient Interpreter Needed:  None Criminal Activity/Legal Involvement Pertinent to Current Situation/Hospitalization:  No - Comment as needed Significant Relationships:  Adult Children Lives with:  Facility Resident Do you feel safe going back to the place where you live?  Yes Need for family participation in patient care:  Yes (Comment)  Care giving concerns:  No concerns voiced at this time.   Social Worker assessment / plan:  Pt hospitalized for [facility] on 09/29/16 with a femur fx. Surgery was completed on 09/30/16. CSW met with pt / daughter at bedside to assist with d/c planning. Pt / daughter requesting pt to return to SNF at dc. SNF contacted and dc plan confirmed. Clinicals are available for SNF to review. CSW will continue to follow to assist with dc planning needs.  Employment status:  Retired Nurse, adult PT Recommendations:  Not assessed at this time Information / Referral to community resources:  [place]  Patient/Family's Response to care:  Pt / daughter are in agreement with plan to returning to [facility] at [employer].  Patient/Family's Understanding of and Emotional Response to Diagnosis, Current Treatment, and Prognosis:  Daughter is aware of pt's medical status. Pt reports that she feels better than she did yesterday. Pt is looking forward to  returning to [facility].  Emotional Assessment Appearance:  Appears stated age Attitude/Demeanor/Rapport:  Other (cooperative) Affect (typically observed):  Appropriate, Pleasant Orientation:  Oriented to Self, Oriented to Place Alcohol / Substance use:  Not Applicable Psych involvement (Current and /or in the community):  No (Comment)  Discharge Needs  Concerns to be addressed:  Discharge Planning Concerns Readmission within the last 30 days:    Current discharge risk:  None Barriers to Discharge:  No Barriers Identified   Luretha Rued, [place] 10/01/2016, 9:50 AM

## 2016-10-01 NOTE — Progress Notes (Signed)
   Subjective: 1 Day Post-Op Procedure(s) (LRB): OPEN REDUCTION INTERNAL FIXATION PERIPROSTHETIC HIP FRACTURE (Left) Patient reports pain as mild.   We will start therapy today.  Plan is to go Skilled nursing facility after hospital stay.  Objective: Vital signs in last 24 hours: Temp:  [98.1 F (36.7 C)-99.8 F (37.7 C)] 98.9 F (37.2 C) (07/16 0500) Pulse Rate:  [69-96] 87 (07/16 0500) Resp:  [10-18] 16 (07/16 0500) BP: (80-133)/(45-107) 112/53 (07/16 0500) SpO2:  [91 %-100 %] 93 % (07/16 0500)  Intake/Output from previous day:  Intake/Output Summary (Last 24 hours) at 10/01/16 0725 Last data filed at 10/01/16 0500  Gross per 24 hour  Intake             2345 ml  Output              875 ml  Net             1470 ml    Intake/Output this shift: No intake/output data recorded.  Labs:  Recent Labs  09/29/16 1521 09/30/16 0538 09/30/16 1313 10/01/16 0516  HGB 12.5 11.0* 9.6* 9.0*    Recent Labs  09/30/16 0538 09/30/16 1313 10/01/16 0516  WBC 11.4*  --  14.2*  RBC 3.24*  --  2.90*  HCT 32.8* 29.4* 27.8*  PLT 174  --  157    Recent Labs  09/30/16 0538 10/01/16 0516  NA 140 138  K 4.6 4.4  CL 102 101  CO2 28 27  BUN 29* 35*  CREATININE 1.18* 1.50*  GLUCOSE 135* 126*  CALCIUM 8.5* 8.2*   No results for input(s): LABPT, INR in the last 72 hours.  EXAM General - Patient is Alert and Appropriate Extremity - Neurovascular intact No cellulitis present Compartment soft Dressing - dressing C/D/I Motor Function - intact, moving foot and toes well on exam.  Hemovac pulled without difficulty.  Past Medical History:  Diagnosis Date  . CAD (coronary artery disease)   . Chronic low back pain   . Contusion of foot, right 05/15/12  . Diverticulitis   . Dyslipidemia   . H/O: hysterectomy   . HTN (hypertension)   . Memory disturbance 05/2012   MMSE 26/30, intact Clock test  . Nonischemic cardiomyopathy (HCC)   . Osteoporosis   . Pulmonary embolism (HCC)    . Right knee pain 05/15/12  . Spinal stenosis   . Ulcer disease   . Weight loss     Assessment/Plan: 1 Day Post-Op Procedure(s) (LRB): OPEN REDUCTION INTERNAL FIXATION PERIPROSTHETIC HIP FRACTURE (Left) Principal Problem:   Periprosthetic supracondylar fracture of femur Active Problems:   Hip fracture (HCC)   Advance diet Up with therapy  DVT Prophylaxis - Lovenox NWB LLE. May work on gentle ROM left knee Hemovac Pulled Begin Therapy  Loanne Drilling

## 2016-10-01 NOTE — Progress Notes (Signed)
Patient ID: [name], female   DOB: [birth year], 83 y.o.   MRN: [id number]    PROGRESS NOTE    ZORIANA OATS  WUJ:[id number] DOB: [birth year] DOA: 09/29/2016  PCP: Merlene Laughter, MD   Brief Narrative:  This is a 83 year old female with moderate to advanced dementia who presented to the hospital after a mechanical fall, sustained distal femoral shaft fracture and underwent ORIF.   Assessment & Plan: Distal femoral shaft fracture, on the left - s/p left hip ORIF, post op day #1 - plan for PT today, Lovenox for DVT prophylaxis - NWB on LLE but allow gentle ROM left knee - plan for d/c SNF in 24-48 hours if blood work stable (Cr and Hg) and if pt clinically improving  - provide analgesia as needed   Acute kidney injury - suspect post op related - pt is however, also on Lasix - encouraged oral intake - will repeat BMP in AM  Leukocytosis - suspect related to reactive process, demargination and stress reaction post op - no evidence of an infectious etiology - will repeat BMP in AM  Post op Hg drop - no evidence of active bleeding - CBC in AM  Hypertension, essential - keep on Coreg and Lasix for now  Symptomatic bradycardia, sinus node dysfunction,status post pacemaker insertion - HR stable this AM in 80's - OK to continue with Coreg   Coronary artery disease - no anginal concerns   Nonischemic cardiomyopathy with systolic heart failure - EF < 95% - continue with Coreg and low dose Lasix  - no evidence of volume overload   Depression/ dementia - continue Zoloft per home medical regimen    DVT prophylaxis: Lovenox SQ Code Status: DNR Family Communication: Patient  And daughter at bedside  Disposition Plan: SNF in AM if Cr and Hg stable  Consultants:   Ortho  PT  Procedures:   Left ORIF, femur  Antimicrobials:   None  Subjective: Pt reports feeling better.   Objective: Vitals:   09/30/16 1707 09/30/16 2100 10/01/16 0135 10/01/16 0500    BP: (!) 96/55 (!) 108/58 (!) 110/59 (!) 112/53  Pulse: 83 96 88 87  Resp: 15 16 16 16   Temp: 98.1 F (36.7 C) 99.3 F (37.4 C) 98.9 F (37.2 C) 98.9 F (37.2 C)  TempSrc: Oral Oral Oral Oral  SpO2: 91% 93% 92% 93%  Weight:      Height:        Intake/Output Summary (Last 24 hours) at 10/01/16 0745 Last data filed at 10/01/16 0500  Gross per 24 hour  Intake             2345 ml  Output              875 ml  Net             1470 ml   Filed Weights   09/29/16 1745  Weight: 72.6 kg (160 lb)    Examination:  General exam: Appears calm and comfortable  Respiratory system: Clear to auscultation. Respiratory effort normal. Cardiovascular system: S1 & S2 heard, RRR. No JVD, rubs, gallops or clicks. No pedal edema. Gastrointestinal system: Abdomen is nondistended, soft and nontender. No organomegaly or masses felt. Central nervous system: Alert and oriented. No focal neurological deficits. Extremities: Symmetric 5 x 5 power.Tenderness in the left hip area  Data Reviewed: I have personally reviewed following labs and imaging studies  CBC:  Recent Labs Lab 09/29/16 1521 09/30/16 0538 09/30/16 1313 10/01/16 6213  WBC 11.8* 11.4*  --  14.2*  NEUTROABS 8.9*  --   --  11.1*  HGB 12.5 11.0* 9.6* 9.0*  HCT 36.5 32.8* 29.4* 27.8*  MCV 101.7* 101.2*  --  95.9  PLT 203 174  --  157   Basic Metabolic Panel:  Recent Labs Lab 09/29/16 1521 09/30/16 0538 10/01/16 0516  NA 141 140 138  K 4.4 4.6 4.4  CL 103 102 101  CO2 28 28 27   GLUCOSE 115* 135* 126*  BUN 28* 29* 35*  CREATININE 1.08* 1.18* 1.50*  CALCIUM 9.0 8.5* 8.2*   Liver Function Tests:  Recent Labs Lab 09/30/16 0538  AST 28  ALT 8*  ALKPHOS 57  BILITOT 1.4*  PROT 6.5  ALBUMIN 3.6   Recent Results (from the past 240 hour(s))  Surgical PCR screen     Status: None   Collection Time: 09/30/16  3:33 AM  Result Value Ref Range Status   MRSA, PCR NEGATIVE NEGATIVE Final   Staphylococcus aureus NEGATIVE  NEGATIVE Final    Radiology Studies: Dg Pelvis 1-2 Views Result Date: 09/29/2016 No acute fracture or dislocation. 2. 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  Dg Tibia/fibula Left Result Date: 09/29/2016 No lower leg fracture or dislocation seen.   Ct Head Wo Contrast Result Date: 09/29/2016 Atrophy with small vessel chronic ischemic changes of deep cerebral white matter. No acute intracranial abnormalities. Degenerative disc and facet disease changes cervical spine. No acute cervical spine abnormalities.  Ct Cervical Spine Wo Contrast Result Date: 09/29/2016 Atrophy with small vessel chronic ischemic changes of deep cerebral white matter. No acute intracranial abnormalities. Degenerative disc and facet disease changes cervical spine. No acute cervical spine abnormalities.   Dg Femur Min 2 Views Left Result Date: 09/30/2016 Internal fixation of the distal femoral fracture with near anatomic alignment.   Dg Femur Min 2 Views Left Result Date: 09/29/2016 Distal femoral shaft fracture, as described above.   Scheduled Meds: . allopurinol  100 mg Oral Daily  . carvedilol  3.125 mg Oral BID WC  . docusate  100 mg Oral BID  . enoxaparin (LOVENOX) injection  30 mg Subcutaneous Q24H  . famotidine  20 mg Oral QHS  . furosemide  20 mg Oral Daily  . polyethylene glycol  17 g Oral Daily  . sertraline  25 mg Oral Daily  . vitamin B-12  1,000 mcg Oral Daily   Continuous Infusions: .  ceFAZolin (ANCEF) IV Stopped (09/30/16 1954)     LOS: 2 days   Time spent: 25 minutes   Debbora Presto, MD [facility] Pager [phone]  If 7PM-7AM, please contact night-coverage [email or web address] Password [email or web address] 10/01/2016, 7:45 AM

## 2016-10-01 NOTE — Progress Notes (Signed)
Plan for d/c to SNF, discharge planning per CSW. [phone] 

## 2016-10-01 NOTE — NC FL2 (Signed)
[place] MEDICAID FL2 LEVEL OF CARE SCREENING TOOL     IDENTIFICATION  Patient Name: [name] Birthdate: Jun 13, 1920 Sex: female Admission Date (Current Location): 09/29/2016  [facility] and [place] Number:  Producer, television/film/video and Address:  [facility],  [address] [place] [address]      Provider Number: [id number]  Attending Physician Name and Address:  Dorothea Ogle, MD  Relative Name and Phone Number:       Current Level of Care: Hospital Recommended Level of Care: Skilled Nursing Facility Prior Approval Number:    Date Approved/Denied:   PASRR Number:    Discharge Plan: SNF    Current Diagnoses: Patient Active Problem List   Diagnosis Date Noted  . Periprosthetic supracondylar fracture of femur 09/30/2016  . Osteoarthritis 05/10/2014  . Gout 05/10/2014  . Anemia 04/19/2014  . Constipation 04/19/2014  . Preop cardiovascular exam 04/05/2014  . Hip fracture (HCC) 04/04/2014  . Right knee pain   . Memory disturbance   . Weight loss 05/27/2012  . Contusion of foot, right 05/15/2012  . Palpitations 01/09/2011  . Pacemaker 10/10/2010  . Chronic systolic heart failure (HCC) 10/10/2010  . Dyslipidemia 10/10/2010    Orientation RESPIRATION BLADDER Height & Weight     Self, Place  Normal Indwelling catheter Weight: 160 lb (72.6 kg) Height:  5\' 2"  (157.5 cm)  BEHAVIORAL SYMPTOMS/MOOD NEUROLOGICAL BOWEL NUTRITION STATUS  Other (Comment) (no behaviors)   Continent Diet  AMBULATORY STATUS COMMUNICATION OF NEEDS Skin   Extensive Assist Verbally Surgical wounds                       Personal Care Assistance Level of Assistance  Bathing, Feeding, Dressing Bathing Assistance: Maximum assistance Feeding assistance: Limited assistance Dressing Assistance: Maximum assistance     Functional Limitations Info  Sight, Hearing, Speech Sight Info: Adequate Hearing Info: Adequate Speech Info: Adequate    SPECIAL CARE FACTORS FREQUENCY  PT  (By licensed PT)     PT Frequency: 5x wk              Contractures Contractures Info: Not present    Additional Factors Info  Code Status, Allergies, Psychotropic Code Status Info: DNR Allergies Info: [employer] Diclofenac-misoprostol, Ibuprofen, Lactose Intolerance (Gi), Milk-related Compounds, Other, Oxycontin Oxycodone Hcl, Pollen Extract Psychotropic Info: zoloft         Current Medications (10/01/2016):  This is the current hospital active medication list Current Facility-Administered Medications  Medication Dose Route Frequency Provider Last Rate Last Dose  . acetaminophen (TYLENOL) tablet 650 mg  650 mg Oral Q6H PRN Ollen Gross, MD       Or  . acetaminophen (TYLENOL) suppository 650 mg  650 mg Rectal Q6H PRN Aluisio, [name], MD      . albuterol (PROVENTIL) (2.5 MG/3ML) 0.083% nebulizer solution 2.5 mg  2.5 mg Nebulization Q6H PRN Arrien, York Ram, MD      . allopurinol (ZYLOPRIM) tablet 100 mg  100 mg Oral Daily Ollen Gross, MD   100 mg at 10/01/16 0851  . carvedilol (COREG) tablet 3.125 mg  3.125 mg Oral BID WC Arrien, York Ram, MD   3.125 mg at 10/01/16 0849  . docusate (COLACE) 50 MG/5ML liquid 100 mg  100 mg Oral BID Coralie Keens, MD   100 mg at 10/01/16 0848  . enoxaparin (LOVENOX) injection 30 mg  30 mg Subcutaneous Q24H Arrien, York Ram, MD   30 mg at 10/01/16 0848  .  famotidine (PEPCID) tablet 20 mg  20 mg Oral QHS Arrien, York Ram, MD   20 mg at 09/30/16 2109  . furosemide (LASIX) tablet 20 mg  20 mg Oral Daily Arrien, York Ram, MD   20 mg at 10/01/16 0848  . HYDROcodone-acetaminophen (NORCO/VICODIN) 5-325 MG per tablet 1 tablet  1 tablet Oral Q4H PRN Arrien, York Ram, MD   1 tablet at 10/01/16 1014  . menthol-cetylpyridinium (CEPACOL) lozenge 3 mg  1 lozenge Oral PRN Aluisio, [name], MD       Or  . phenol (CHLORASEPTIC) mouth spray 1 spray  1 spray Mouth/Throat PRN Aluisio, [name], MD      . morphine 4 MG/ML  injection 1 mg  1 mg Intravenous Q4H PRN Arrien, York Ram, MD   1 mg at 09/29/16 2302  . nitroGLYCERIN (NITROSTAT) SL tablet 0.4 mg  0.4 mg Sublingual Q5 min PRN Arrien, York Ram, MD      . ondansetron [facility]) tablet 4 mg  4 mg Oral Q6H PRN Arrien, York Ram, MD       Or  . ondansetron [facility]) injection 4 mg  4 mg Intravenous Q6H PRN Arrien, York Ram, MD      . polyethylene glycol [facility] [place] [facility] Ethelene Hal) packet 17 g  17 g Oral Daily Coralie Keens, MD   17 g at 10/01/16 0847  . sertraline (ZOLOFT) tablet 25 mg  25 mg Oral Daily Arrien, York Ram, MD   25 mg at 10/01/16 0849  . vitamin B-12 (CYANOCOBALAMIN) tablet 1,000 mcg  1,000 mcg Oral Daily Arrien, York Ram, MD   1,000 mcg at 10/01/16 1610     Discharge Medications: Please see discharge summary for a list of discharge medications.  Relevant Imaging Results:  Relevant Lab Results:   Additional Information SS # [id number]  Kamirah Shugrue, Dickey Gave, LCSW

## 2016-10-01 NOTE — Progress Notes (Signed)
Physical Therapy Evaluation Patient Details Name: [name] MRN: [id number] DOB: 1920-06-27 Today's Date: 10/01/2016   History of Present Illness  83 yo female s/p LLE ORIF due to Periprosthetic supracondylar fracture of femur  Clinical Impression  Pt is s/p ORIF surgery resulting in functional limitation due to the deficits listed below. Pt will benefit from skilled PT to increase their independence and safety with mobility to allow discharge. Pt required max assist to transfer to EOB and reported being in significant amounts of pain. Pt experiencing baseline cognitive impairments and required encouragement from family.    Follow Up Recommendations SNF    Equipment Recommendations  None recommended by PT    Recommendations for Other Services       Precautions / Restrictions Precautions Precautions: Fall Required Braces or Orthoses: Knee Immobilizer - Left (not in orders, however currently in place) Other Brace/Splint: gentle ROM left knee Restrictions Weight Bearing Restrictions: Yes LLE Weight Bearing: Non weight bearing      Mobility  Bed Mobility Overal bed mobility: Needs Assistance Bed Mobility: Supine to Sit;Sit to Supine     Supine to sit: Max assist;+2 for physical assistance Sit to supine: Max assist;+2 for physical assistance   General bed mobility comments: Pt required assistance due to pain, pt only able to sit at EOB for 2-3 minutes, preferred to lateral lean to right side, offweighting L hip  Transfers                    Ambulation/Gait                Stairs            Wheelchair Mobility    Modified Rankin (Stroke Patients Only)       Balance Overall balance assessment: Needs assistance Sitting-balance support: Single extremity supported Sitting balance-Leahy Scale: Poor Sitting balance - Comments: Pt required bed railing for support Postural control: Right lateral lean                                    Pertinent Vitals/Pain Pain Assessment: Faces Faces Pain Scale: Hurts little more (Pt stated that she was experiencing a lot of pain as well as none at all) Pain Location: LLE Pain Descriptors / Indicators: Aching;Grimacing;Sore;Moaning Pain Intervention(s): Ice applied;Repositioned;Monitored during session;Premedicated before session;Limited activity within patient's tolerance    Home Living Family/patient expects to be discharged to:: Skilled nursing facility                      Prior Function Level of Independence: Independent with assistive device(s)               Hand Dominance        Extremity/Trunk Assessment   Upper Extremity Assessment Upper Extremity Assessment: Overall WFL for tasks assessed    Lower Extremity Assessment Lower Extremity Assessment: Generalized weakness;LLE deficits/detail LLE Deficits / Details: L LE s/p ORIF, kept KI in place, pt c/o increased pain with any movement, requires assistance       Communication   Communication: No difficulties  Cognition Arousal/Alertness: Awake/alert Behavior During Therapy: WFL for tasks assessed/performed Overall Cognitive Status: History of cognitive impairments - at baseline                                 General Comments: Pt experiencing  moderate to severe dementia      General Comments      Exercises     Assessment/Plan    PT Assessment Patient needs continued PT services  PT Problem List Decreased strength;Decreased mobility;Decreased coordination;Decreased safety awareness;Decreased activity tolerance;Decreased balance;Decreased knowledge of use of DME;Decreased knowledge of precautions;Pain       PT Treatment Interventions DME instruction;Therapeutic activities;Gait training;Therapeutic exercise;Patient/family education;Balance training;Functional mobility training    PT Goals (Current goals can be found in the Care Plan section)  Acute Rehab PT Goals PT  Goal Formulation: With patient/family Time For Goal Achievement: 10/15/16 Potential to Achieve Goals: Good    Frequency Min 3X/week   Barriers to discharge        Co-evaluation               AM-PAC PT "6 Clicks" Daily Activity  Outcome Measure Difficulty turning over in bed (including adjusting bedclothes, sheets and blankets)?: Total Difficulty moving from lying on back to sitting on the side of the bed? : Total Difficulty sitting down on and standing up from a chair with arms (e.g., wheelchair, bedside commode, etc,.)?: Total Help needed moving to and from a bed to chair (including a wheelchair)?: Total Help needed walking in hospital room?: Total Help needed climbing 3-5 steps with a railing? : Total 6 Click Score: 6    End of Session Equipment Utilized During Treatment: Left knee immobilizer Activity Tolerance: Patient limited by pain Patient left: in bed;with family/visitor present;with call bell/phone within reach Nurse Communication: Mobility status PT Visit Diagnosis: Difficulty in walking, not elsewhere classified (R26.2)    Time: 9163-8466 PT Time Calculation (min) (ACUTE ONLY): 17 min   Charges:   PT Evaluation $PT Eval Low Complexity: 1 Procedure     PT G CodesMarlene Bast, SPT  Kathrene Bongo 10/01/2016, 1:26 PM

## 2016-10-01 NOTE — Progress Notes (Signed)
OT Cancellation Note  Patient Details Name: SIONNA MUCHNICK MRN: [id number] DOB: [birth year]   Cancelled Treatment:    Reason Eval/Treat Not Completed: Other (comment)  Spoke with PT regarding pt.  Pt is limited due to NWB and dementia.  Do not feel pt would be able to participate with OT .    Lise Auer, [place] [phone] Einar Crow D 10/01/2016, 12:29 PM

## 2016-10-02 LAB — BASIC METABOLIC PANEL
ANION GAP: 8 (ref 5–15)
BUN: 45 mg/dL — ABNORMAL HIGH (ref 6–20)
CALCIUM: 8 mg/dL — AB (ref 8.9–10.3)
CO2: 28 mmol/L (ref 22–32)
Chloride: 103 mmol/L (ref 101–111)
Creatinine, Ser: 1.61 mg/dL — ABNORMAL HIGH (ref 0.44–1.00)
GFR, EST AFRICAN AMERICAN: 30 mL/min — AB (ref >60)
GFR, EST NON AFRICAN AMERICAN: 26 mL/min — AB (ref >60)
Glucose, Bld: 113 mg/dL — ABNORMAL HIGH (ref 65–99)
POTASSIUM: 3.6 mmol/L (ref 3.5–5.1)
SODIUM: 139 mmol/L (ref 135–145)

## 2016-10-02 LAB — CBC
HCT: 25.6 % — ABNORMAL LOW (ref 36.0–46.0)
Hemoglobin: 8.2 g/dL — ABNORMAL LOW (ref 12.0–15.0)
MCH: 31.2 pg (ref 26.0–34.0)
MCHC: 32 g/dL (ref 30.0–36.0)
MCV: 97.3 fL (ref 78.0–100.0)
PLATELETS: 144 10*3/uL — AB (ref 150–400)
RBC: 2.63 MIL/uL — AB (ref 3.87–5.11)
RDW: 13.9 % (ref 11.5–15.5)
WBC: 9.9 10*3/uL (ref 4.0–10.5)

## 2016-10-02 MED ORDER — ENOXAPARIN SODIUM 30 MG/0.3ML ~~LOC~~ SOLN: 30.0000 mg | SUBCUTANEOUS | 0 refills | Status: DC

## 2016-10-02 NOTE — Progress Notes (Signed)
Patient ID: [name], female   DOB: 1920/08/20, 83 y.o.   MRN: [id number]    PROGRESS NOTE   [name]  [id number]:[id number] DOB: [birth year] DOA: 09/29/2016  PCP: Merlene Laughter, MD   Brief Narrative:  This is a 83 year old female with moderate to advanced dementia who presented to the hospital after a mechanical fall, sustained distal femoral shaft fracture and underwent ORIF.   Assessment & Plan: Distal femoral shaft fracture, on the left - s/p left hip ORIF, post op day #2 - PT eval done, SNF recommended - continue PT while inpatient - DVT prophylaxis : Lovenox SQ - NWB on LLE but allow gentle ROM left knee - allow analgesia as needed   Acute kidney injury - suspect post op related - Cr still up this AM  - will stop lasix today - encouraged oral intake, repeat BMP in AM  Leukocytosis - suspect related to reactive process, demargination and stress reaction post op - resolved   Post op Hg drop, thrombocytopenia  - hg drop noted post op and still trending down - no evidence of active bleeding - transfuse for Hg < 8 - CBC in AM  Hypertension, essential - continue Coreg - stop lasix as Cr is still trending up  Symptomatic bradycardia, sinus node dysfunction,status post pacemaker insertion - HR stable this AM, keep on COreg   Coronary artery disease - no anginal concerns   Nonischemic cardiomyopathy with systolic heart failure - EF < 02% - continue Coreg, avoid IVF as pt is now euvolemic  - monitor weights, I/O  Depression/ dementia - continue Zoloft per home medical regimen   DVT prophylaxis: Lovenox SQ Code Status: DNR Family Communication: pt and daughter at bedside  Disposition Plan: SNF In AM if Cr improving and Hg stable, if Hg drops < 8, transfuse 1-2 U PRBC   Consultants:   Ortho  PT  Procedures:   Left ORIF, femur  Antimicrobials:   None  Subjective: Pt had rough night, says did not sleep much and was agitated.    Objective: Vitals:   10/01/16 1726 10/01/16 2128 10/02/16 0547 10/02/16 0754  BP: 112/62 (!) 117/56 (!) 112/49 (!) 110/57  Pulse: 82 81 81 77  Resp:  18 17   Temp:  98 F (36.7 C) 98.4 F (36.9 C)   TempSrc:  Oral Oral   SpO2:  100% 94% 94%  Weight:      Height:        Intake/Output Summary (Last 24 hours) at 10/02/16 0939 Last data filed at 10/02/16 0547  Gross per 24 hour  Intake              570 ml  Output              725 ml  Net             -155 ml   Filed Weights   09/29/16 1745  Weight: 72.6 kg (160 lb)    Physical Exam  Constitutional: Appears well-developed and well-nourished. No distress.  CVS: RRR, S1/S2 +, no gallops, no carotid bruit.  Pulmonary: Effort and breath sounds normal, no stridor, rhonchi, wheezes, rales.  Abdominal: Soft. BS +,  no distension, tenderness, rebound or guarding.  Musculoskeletal: Normal range of motion. TTP in left hip area   Data Reviewed: I have personally reviewed following labs and imaging studies  CBC:  Recent Labs Lab 09/29/16 1521 09/30/16 0538 09/30/16 1313 10/01/16 0516 10/02/16 0739  WBC 11.8* 11.4*  --  14.2* 9.9  NEUTROABS 8.9*  --   --  11.1*  --   HGB 12.5 11.0* 9.6* 9.0* 8.2*  HCT 36.5 32.8* 29.4* 27.8* 25.6*  MCV 101.7* 101.2*  --  95.9 97.3  PLT 203 174  --  157 144*   Basic Metabolic Panel:  Recent Labs Lab 09/29/16 1521 09/30/16 0538 10/01/16 0516 10/02/16 0739  NA 141 140 138 139  K 4.4 4.6 4.4 3.6  CL 103 102 101 103  CO2 28 28 27 28   GLUCOSE 115* 135* 126* 113*  BUN 28* 29* 35* 45*  CREATININE 1.08* 1.18* 1.50* 1.61*  CALCIUM 9.0 8.5* 8.2* 8.0*   Liver Function Tests:  Recent Labs Lab 09/30/16 0538  AST 28  ALT 8*  ALKPHOS 57  BILITOT 1.4*  PROT 6.5  ALBUMIN 3.6   Recent Results (from the past 240 hour(s))  Surgical PCR screen     Status: None   Collection Time: 09/30/16  3:33 AM  Result Value Ref Range Status   MRSA, PCR NEGATIVE NEGATIVE Final   Staphylococcus aureus  NEGATIVE NEGATIVE Final    Radiology Studies: Dg Pelvis 1-2 Views Result Date: 09/29/2016 No acute fracture or dislocation. 2. 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  Dg Tibia/fibula Left Result Date: 09/29/2016 No lower leg fracture or dislocation seen.   Ct Head Wo Contrast Result Date: 09/29/2016 Atrophy with small vessel chronic ischemic changes of deep cerebral white matter. No acute intracranial abnormalities. Degenerative disc and facet disease changes cervical spine. No acute cervical spine abnormalities.  Ct Cervical Spine Wo Contrast Result Date: 09/29/2016 Atrophy with small vessel chronic ischemic changes of deep cerebral white matter. No acute intracranial abnormalities. Degenerative disc and facet disease changes cervical spine. No acute cervical spine abnormalities.   Dg Femur Min 2 Views Left Result Date: 09/30/2016 Internal fixation of the distal femoral fracture with near anatomic alignment.   Dg Femur Min 2 Views Left Result Date: 09/29/2016 Distal femoral shaft fracture, as described above.   Scheduled Meds: . allopurinol  100 mg Oral Daily  . carvedilol  3.125 mg Oral BID WC  . docusate sodium  100 mg Oral BID  . enoxaparin (LOVENOX) injection  30 mg Subcutaneous Q24H  . famotidine  20 mg Oral QHS  . furosemide  20 mg Oral Daily  . polyethylene glycol  17 g Oral Daily  . sertraline  25 mg Oral Daily  . vitamin B-12  1,000 mcg Oral Daily   Continuous Infusions:    LOS: 3 days   Time spent: 25 minutes  Debbora Presto, MD [facility] Pager [phone]  If 7PM-7AM, please contact night-coverage [email or web address] Password [facility] 10/02/2016, 9:39 AM

## 2016-10-02 NOTE — Progress Notes (Signed)
Subjective: 2 Days Post-Op Procedure(s) (LRB): OPEN REDUCTION INTERNAL FIXATION PERIPROSTHETIC HIP FRACTURE (Left) Patient reports pain as mild.   Daughter in room with patient. Patient very disoriented this AM  Objective: Vital signs in last 24 hours: Temp:  [98 F (36.7 C)-98.9 F (37.2 C)] 98.4 F (36.9 C) (07/17 0547) Pulse Rate:  [75-82] 77 (07/17 0754) Resp:  [16-18] 17 (07/17 0547) BP: (92-117)/(37-62) 110/57 (07/17 0754) SpO2:  [94 %-100 %] 94 % (07/17 0754)  Intake/Output from previous day: 07/16 0701 - 07/17 0700 In: 740 [P.O.:660; I.V.:30; IV Piggyback:50] Out: 725 [Urine:725] Intake/Output this shift: No intake/output data recorded.   Recent Labs  09/29/16 1521 09/30/16 0538 09/30/16 1313 10/01/16 0516 10/02/16 0739  HGB 12.5 11.0* 9.6* 9.0* 8.2*    Recent Labs  10/01/16 0516 10/02/16 0739  WBC 14.2* 9.9  RBC 2.90* 2.63*  HCT 27.8* 25.6*  PLT 157 144*    Recent Labs  10/01/16 0516 10/02/16 0739  NA 138 139  K 4.4 3.6  CL 101 103  CO2 27 28  BUN 35* 45*  CREATININE 1.50* 1.61*  GLUCOSE 126* 113*  CALCIUM 8.2* 8.0*   No results for input(s): LABPT, INR in the last 72 hours.  Incision: dressing C/D/I No cellulitis present Compartment soft No knee effusion; Thigh soft with minimal swelling  Assessment/Plan: 2 Days Post-Op Procedure(s) (LRB): OPEN REDUCTION INTERNAL FIXATION PERIPROSTHETIC HIP FRACTURE (Left) Up with therapy  Recheck hemoglobin in AM and transfuse if continuing to decrease Dressing change in AM  Leylah Tarnow V 10/02/2016, 10:27 AM

## 2016-10-02 NOTE — Discharge Instructions (Addendum)
Nonweightbearing left LE Lovenox 30mg  QD for DVT prophylaxis then transition to aspirin 81 mg daily Follow up in office in two weeks with Dr. [name]   Fall Prevention in Hospitals, Adult WHAT ARE SOME SAFETY TIPS FOR PREVENTING FALLS? If you or a loved one has to stay in the hospital, talk with the health care providers about the risk of falling. Find out which medicines or treatments can cause dizziness or affect balance. Make a plan with the health care providers to prevent falls. The plan may include these points:  Ask for help moving around, especially after surgery or when feeling unwell.  Have support available when getting up and moving around.  Wear nonskid footwear.  Use the safety straps on wheelchairs.  Ask for help to get objects that are out of reach.  Wear eyeglasses.  Remove all clutter from the floor and the sides of the bed.  Keep equipment and wires securely out of the way.  Keep the bed locked in the low position.  Keep the side rails up on the bed.  Keep the nurse call button within reach.  Keep the door open when no one else is in the room.  Have someone stay in the hospital with you or your loved one.  Ask for a bed alarm if you are not able to stay with your loved one who is at risk for getting up without help.  Ask if sleeping pills or other medicines that alter mental status are necessary.  WHAT INCREASES THE RISK FOR FALLS? Certain conditions and treatments may increase a patient's risk for falls in a hospital. These include:  Being in an unfamiliar environment.  Being on bed rest.  Having surgery.  Taking certain medicines, such as sleeping pills.  Having tubes in place, such as IV lines or catheters.  Additional risk factors for falls in a hospital include:  Having vision problems.  Having a change in thinking, feeling, or behavior (altered mental status).  Having trouble with balance.  Needing to use the toilet  frequently.  Having fallen in the past three months.  Having low blood pressure when standing up quickly (orthostatic hypotension).  WHAT DOES THE HOSPITAL STAFF DO TO HELP PREVENT ME OR MY LOVED ONE FROM FALLING? Hospitals have systems in place to prevent falls and accidents. Talk with the hospital staff about:  Doing an assessment to discuss fall risks and create a personalized plan to prevent falls.  Checking in regularly to see if help is needed for moving around and to assess any changes in fall risk.  Knowing where the nurse call button is and how to use it. Use this to call a nursing care provider any time.  Keeping personal items within reach. This includes eyeglasses, phones, and other electronic devices.  Following general safety guidelines when moving around.  Keeping the area around the bed free from clutter.  Removing unnecessary equipment or tubes to reduce the risk of tripping.  Using safety equipment, such as: ? Walkers, crutches, and other walking devices for support. ? Safety rails on the bed. ? Safety straps in the bed. ? A bed that can be lowered and locked to prevent movement. ? Handrails in the bathroom. ? Nonskid socks and shoes. ? Locking mechanisms to secure equipment in place. ? Lifting and transfer equipment.  WHAT CAN I DO TO HELP PREVENT A FALL?  Talk with health care providers about fall prevention.  Have a personalized fall prevention plan in place.  Do  not try to move around if you feel off balance or ill.  Change position slowly.  Sit on the side of the bed before standing up.  Sit down and call for help if you feel dizzy or unsteady when standing.  Keep the hospital room clear of clutter.  WHEN SHOULD I ASK FOR HELP? Ask for help whenever you:  Are not sure if you are able to move around safely.  Feel dizzy or unsteady.  Are not comfortable helping your loved one move around or use the bathroom.  If you or a loved one falls,  tell the hospital staff. This is important. This information is not intended to replace advice given to you by your health care provider. Make sure you discuss any questions you have with your health care provider. Document Released: 03/02/2000 Document Revised: 08/02/2015 Document Reviewed: 12/16/2014 Elsevier Interactive Patient Education  2017 [employer].

## 2016-10-03 LAB — BASIC METABOLIC PANEL
ANION GAP: 11 (ref 5–15)
BUN: 46 mg/dL — AB (ref 6–20)
CHLORIDE: 103 mmol/L (ref 101–111)
CO2: 27 mmol/L (ref 22–32)
Calcium: 8.1 mg/dL — ABNORMAL LOW (ref 8.9–10.3)
Creatinine, Ser: 1.34 mg/dL — ABNORMAL HIGH (ref 0.44–1.00)
GFR calc Af Amer: 38 mL/min — ABNORMAL LOW (ref >60)
GFR, EST NON AFRICAN AMERICAN: 33 mL/min — AB (ref >60)
GLUCOSE: 118 mg/dL — AB (ref 65–99)
POTASSIUM: 4.1 mmol/L (ref 3.5–5.1)
Sodium: 141 mmol/L (ref 135–145)

## 2016-10-03 LAB — BPAM RBC
BLOOD PRODUCT EXPIRATION DATE: 201808062359
Blood Product Expiration Date: 201808132359
ISSUE DATE / TIME: 201807150728
ISSUE DATE / TIME: 201807150728
UNIT TYPE AND RH: 600
Unit Type and Rh: 600

## 2016-10-03 LAB — CBC
HEMATOCRIT: 22.8 % — AB (ref 36.0–46.0)
HEMOGLOBIN: 8.3 g/dL — AB (ref 12.0–15.0)
MCH: 37.4 pg — AB (ref 26.0–34.0)
MCHC: 36.4 g/dL — ABNORMAL HIGH (ref 30.0–36.0)
MCV: 102.7 fL — AB (ref 78.0–100.0)
Platelets: 175 10*3/uL (ref 150–400)
RBC: 2.22 MIL/uL — AB (ref 3.87–5.11)
RDW: 14.1 % (ref 11.5–15.5)
WBC: 10.4 10*3/uL (ref 4.0–10.5)

## 2016-10-03 LAB — TYPE AND SCREEN
ABO/RH(D): A NEG
ANTIBODY SCREEN: POSITIVE
DAT, IgG: NEGATIVE
Donor AG Type: NEGATIVE
Donor AG Type: NEGATIVE
PT AG Type: NEGATIVE
UNIT DIVISION: 0
Unit division: 0

## 2016-10-03 NOTE — Progress Notes (Signed)
   Subjective: 3 Days Post-Op Procedure(s) (LRB): OPEN REDUCTION INTERNAL FIXATION PERIPROSTHETIC HIP FRACTURE (Left) Patient reports pain as mild.   Patient seen in rounds for Dr. [name]. Patient is well, and has had no acute complaints or problems. Patient sleeping comfortable in bed during rounds. Patient daughter in the room. Discussed plan of care.  Plan is to go Skilled nursing facility after hospital stay.  Objective: Vital signs in last 24 hours: Temp:  [98.6 F (37 C)-99.7 F (37.6 C)] 98.6 F (37 C) (07/18 0625) Pulse Rate:  [70-91] 70 (07/18 0820) Resp:  [16] 16 (07/18 0625) BP: (98-115)/(51-72) 113/54 (07/18 0820) SpO2:  [92 %-96 %] 94 % (07/18 0625)  Intake/Output from previous day:  Intake/Output Summary (Last 24 hours) at 10/03/16 1051 Last data filed at 10/03/16 0900  Gross per 24 hour  Intake              480 ml  Output              625 ml  Net             -145 ml    Intake/Output this shift: Total I/O In: 60 [P.O.:60] Out: -   Labs:  Recent Labs  09/30/16 1313 10/01/16 0516 10/02/16 0739 10/03/16 0536  HGB 9.6* 9.0* 8.2* 8.3*    Recent Labs  10/02/16 0739 10/03/16 0536  WBC 9.9 10.4  RBC 2.63* 2.22*  HCT 25.6* 22.8*  PLT 144* 175    Recent Labs  10/02/16 0739 10/03/16 0536  NA 139 141  K 3.6 4.1  CL 103 103  CO2 28 27  BUN 45* 46*  CREATININE 1.61* 1.34*  GLUCOSE 113* 118*  CALCIUM 8.0* 8.1*    EXAM General - Patient is Alert and Oriented Extremity - Neurologically intact Intact pulses distally Dorsiflexion/Plantar flexion intact No cellulitis present Compartment soft Dressing/Incision - clean, dry, no drainage Motor Function - intact, moving foot and toes well on exam.   Past Medical History:  Diagnosis Date  . CAD (coronary artery disease)   . Chronic low back pain   . Contusion of foot, right 05/15/12  . Diverticulitis   . Dyslipidemia   . H/O: hysterectomy   . HTN (hypertension)   . Memory disturbance 05/2012    MMSE 26/30, intact Clock test  . Nonischemic cardiomyopathy (HCC)   . Osteoporosis   . Pulmonary embolism (HCC)   . Right knee pain 05/15/12  . Spinal stenosis   . Ulcer disease   . Weight loss     Assessment/Plan: 3 Days Post-Op Procedure(s) (LRB): OPEN REDUCTION INTERNAL FIXATION PERIPROSTHETIC HIP FRACTURE (Left) Principal Problem:   Periprosthetic supracondylar fracture of femur Active Problems:   Hip fracture (HCC)  Estimated body mass index is 29.26 kg/m as calculated from the following:   Height as of this encounter: 5\' 2"  (1.575 m).   Weight as of this encounter: 72.6 kg (160 lb).  Discharge to SNF when medically stable Will return to [facility] Continue NWB left LE Hemoglobin stable but will continue to monitor  Dimitri Ped, PA-C Orthopaedic Surgery 10/03/2016, 10:51 AM

## 2016-10-03 NOTE — Progress Notes (Signed)
Patient ID: [name], female   DOB: 1920-05-29, 83 y.o.   MRN: [id number]    PROGRESS NOTE   ANZLEY BOLENBAUGH  OEU:[id number] DOB: August 23, 1920 DOA: 09/29/2016  PCP: Merlene Laughter, MD   Brief Narrative:  This is a 83 year old female with moderate to advanced dementia who presented to the hospital after a mechanical fall, sustained distal femoral shaft fracture and underwent ORIF.   Assessment & Plan: Distal femoral shaft fracture, on the left - s/p left hip ORIF, post op day #3 - PT eval done, SNF recommended - pt had better night per daughter at bedside  - DVT prophylaxis : Lovenox SQ - NWB on LLE but allow gentle ROM left knee - allow analgesia as needed   - if Hg stable, plan for d/c in AM  Acute kidney injury - suspect post op related - lasix was held and Cr is trending down - BMP in AM - if Cr better in AM, can resume lasix and d/c SNF  Leukocytosis - suspect related to reactive process, demargination and stress reaction post op - resolved   Post op Hg drop, thrombocytopenia  - hg drop noted post op  - no evidence of active bleeding - Hg stable in the past 24 hours - Plt are now WNL - will repeat again in am and if it remains stable, can plan d/c to SNF  Hypertension, essential - continue Coreg  Symptomatic bradycardia, sinus node dysfunction,status post pacemaker insertion - HR stable this AM, keep on Coreg   Coronary artery disease - no anginal concerns   Nonischemic cardiomyopathy with systolic heart failure - EF < 15% - continue Coreg, avoid IVF as pt is now euvolemic  - monitor weights, I/O  Depression/ dementia - continue Zoloft per home medical regimen   DVT prophylaxis: Lovenox SQ Code Status: DNR Family Communication: pt and daughter at bedside  Disposition Plan: SNF In AM if Cr and Hg stable   Consultants:   Ortho  PT  Procedures:   Left ORIF, femur  Antimicrobials:   None  Subjective: Pt reports feeling better this AM.     Objective: Vitals:   10/02/16 1900 10/02/16 2111 10/03/16 0625 10/03/16 0820  BP: (!) 108/51 (!) 98/51 (!) 115/59 (!) 113/54  Pulse: 91 78 71 70  Resp:  16 16   Temp:  99.7 F (37.6 C) 98.6 F (37 C)   TempSrc:  Oral Axillary   SpO2:  96% 94%   Weight:      Height:        Intake/Output Summary (Last 24 hours) at 10/03/16 1226 Last data filed at 10/03/16 0900  Gross per 24 hour  Intake              480 ml  Output              500 ml  Net              -20 ml   Filed Weights   09/29/16 1745  Weight: 72.6 kg (160 lb)    Physical Exam  Constitutional: Appears well-developed and well-nourished. No distress.  CVS: RRR, S1/S2 +, no murmurs, no gallops, no carotid bruit.  Pulmonary: Effort and breath sounds normal, no stridor, rhonchi, wheezes, rales.  Abdominal: Soft. BS +,  no distension, tenderness, rebound or guarding.  Musculoskeletal: Normal range of motion. Mild soreness in the left hip area   Data Reviewed: I have personally reviewed following labs and imaging studies  CBC:  Recent Labs Lab 09/29/16 1521 09/30/16 0538 09/30/16 1313 10/01/16 0516 10/02/16 0739 10/03/16 0536  WBC 11.8* 11.4*  --  14.2* 9.9 10.4  NEUTROABS 8.9*  --   --  11.1*  --   --   HGB 12.5 11.0* 9.6* 9.0* 8.2* 8.3*  HCT 36.5 32.8* 29.4* 27.8* 25.6* 22.8*  MCV 101.7* 101.2*  --  95.9 97.3 102.7*  PLT 203 174  --  157 144* 175   Basic Metabolic Panel:  Recent Labs Lab 09/29/16 1521 09/30/16 0538 10/01/16 0516 10/02/16 0739 10/03/16 0536  NA 141 140 138 139 141  K 4.4 4.6 4.4 3.6 4.1  CL 103 102 101 103 103  CO2 28 28 27 28 27   GLUCOSE 115* 135* 126* 113* 118*  BUN 28* 29* 35* 45* 46*  CREATININE 1.08* 1.18* 1.50* 1.61* 1.34*  CALCIUM 9.0 8.5* 8.2* 8.0* 8.1*   Liver Function Tests:  Recent Labs Lab 09/30/16 0538  AST 28  ALT 8*  ALKPHOS 57  BILITOT 1.4*  PROT 6.5  ALBUMIN 3.6   Recent Results (from the past 240 hour(s))  Surgical PCR screen     Status: None    Collection Time: 09/30/16  3:33 AM  Result Value Ref Range Status   MRSA, PCR NEGATIVE NEGATIVE Final   Staphylococcus aureus NEGATIVE NEGATIVE Final    Radiology Studies: Dg Pelvis 1-2 Views Result Date: 09/29/2016 No acute fracture or dislocation. 2. 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  Dg Tibia/fibula Left Result Date: 09/29/2016 No lower leg fracture or dislocation seen.   Ct Head Wo Contrast Result Date: 09/29/2016 Atrophy with small vessel chronic ischemic changes of deep cerebral white matter. No acute intracranial abnormalities. Degenerative disc and facet disease changes cervical spine. No acute cervical spine abnormalities.  Ct Cervical Spine Wo Contrast Result Date: 09/29/2016 Atrophy with small vessel chronic ischemic changes of deep cerebral white matter. No acute intracranial abnormalities. Degenerative disc and facet disease changes cervical spine. No acute cervical spine abnormalities.   Dg Femur Min 2 Views Left Result Date: 09/30/2016 Internal fixation of the distal femoral fracture with near anatomic alignment.   Dg Femur Min 2 Views Left Result Date: 09/29/2016 Distal femoral shaft fracture, as described above.   Scheduled Meds: . allopurinol  100 mg Oral Daily  . carvedilol  3.125 mg Oral BID WC  . docusate sodium  100 mg Oral BID  . enoxaparin (LOVENOX) injection  30 mg Subcutaneous Q24H  . famotidine  20 mg Oral QHS  . polyethylene glycol  17 g Oral Daily  . sertraline  25 mg Oral Daily  . vitamin B-12  1,000 mcg Oral Daily   Continuous Infusions:    LOS: 4 days   Time spent: 25 minutes   Debbora Presto, MD [facility] Pager [phone]  If 7PM-7AM, please contact night-coverage [email or web address] Password [facility] 10/03/2016, 12:26 PM

## 2016-10-03 NOTE — Progress Notes (Signed)
Physical Therapy Treatment Patient Details Name: [name] MRN: [id number] DOB: [birth year] Today's Date: 10/03/2016    History of Present Illness 83 yo female s/p LLE ORIF due to Periprosthetic supracondylar fracture of femur    PT Comments    The patient tolerated  standing with 2  Person assist  From raised bed x 2 with RW and LLE supported to ensyure NWB. Plans DC to SNF.  Follow Up Recommendations  SNF     Equipment Recommendations  None recommended by PT    Recommendations for Other Services       Precautions / Restrictions      Mobility  Bed Mobility Overal bed mobility: Needs Assistance Bed Mobility: Supine to Sit;Sit to Supine     Supine to sit: Max assist;+2 for physical assistance Sit to supine: Max assist;+2 for physical assistance   General bed mobility comments: Pt required assistance of 2. patient did assist with moving legs to bed edge with assistance and used rail  to self assist. Assist with trunk and left leg and bed pad to slide around and to return to bed.  Transfers Overall transfer level: Needs assistance Equipment used: Rolling walker (2 wheeled) Transfers: Sit to/from Stand Sit to Stand: From elevated surface         General transfer comment: Bed raised  to assist to power up to stand  with LLE supported to prevent to ensure NWB.   Ambulation/Gait                 Stairs            Wheelchair Mobility    Modified Rankin (Stroke Patients Only)       Balance                                            Cognition Arousal/Alertness: Awake/alert   Overall Cognitive Status: History of cognitive impairments - at baseline                                        Exercises      General Comments        Pertinent Vitals/Pain Faces Pain Scale: Hurts little more Pain Location: LLE Pain Descriptors / Indicators: Aching;Grimacing;Sore;Moaning Pain Intervention(s): Premedicated  before session;Monitored during session;Repositioned;Ice applied    Home Living                      Prior Function            PT Goals (current goals can now be found in the care plan section) Progress towards PT goals: Progressing toward goals    Frequency    Min 3X/week      PT Plan Current plan remains appropriate    Co-evaluation              AM-PAC PT "6 Clicks" Daily Activity  Outcome Measure  Difficulty turning over in bed (including adjusting bedclothes, sheets and blankets)?: Total Difficulty moving from lying on back to sitting on the side of the bed? : Total Difficulty sitting down on and standing up from a chair with arms (e.g., wheelchair, bedside commode, etc,.)?: Total Help needed moving to and from a bed to chair (including a wheelchair)?: Total Help needed walking in  hospital room?: Total Help needed climbing 3-5 steps with a railing? : Total 6 Click Score: 6    End of Session Equipment Utilized During Treatment: Left knee immobilizer Activity Tolerance: Patient tolerated treatment well Patient left: in bed;with call bell/phone within reach;with bed alarm set Nurse Communication: Mobility status PT Visit Diagnosis: Difficulty in walking, not elsewhere classified (R26.2)     Time: 0940-1001 PT Time Calculation (min) (ACUTE ONLY): 21 min  Charges:  $Therapeutic Activity: 8-22 mins                    G CodesBlanchard Kelch PT [phone]   Rada Hay 10/03/2016, 11:15 AM

## 2016-10-03 NOTE — Care Management Important Message (Signed)
Important Message  Patient Details  Name: [name] MRN: [id number] Date of Birth: 1920/03/28   Medicare Important Message Given:  Yes    Caren Macadam 10/03/2016, 11:28 AMImportant Message  Patient Details  Name: [name] MRN: [id number] Date of Birth: 03-23-1920   Medicare Important Message Given:  Yes    Caren Macadam 10/03/2016, 11:28 AM

## 2016-10-04 LAB — BASIC METABOLIC PANEL
ANION GAP: 8 (ref 5–15)
BUN: 44 mg/dL — ABNORMAL HIGH (ref 6–20)
CALCIUM: 8.3 mg/dL — AB (ref 8.9–10.3)
CO2: 29 mmol/L (ref 22–32)
Chloride: 104 mmol/L (ref 101–111)
Creatinine, Ser: 1.09 mg/dL — ABNORMAL HIGH (ref 0.44–1.00)
GFR calc Af Amer: 49 mL/min — ABNORMAL LOW (ref >60)
GFR, EST NON AFRICAN AMERICAN: 42 mL/min — AB (ref >60)
Glucose, Bld: 109 mg/dL — ABNORMAL HIGH (ref 65–99)
POTASSIUM: 3.9 mmol/L (ref 3.5–5.1)
SODIUM: 141 mmol/L (ref 135–145)

## 2016-10-04 LAB — CBC
HCT: 25.5 % — ABNORMAL LOW (ref 36.0–46.0)
Hemoglobin: 8.1 g/dL — ABNORMAL LOW (ref 12.0–15.0)
MCH: 31.8 pg (ref 26.0–34.0)
MCHC: 31.8 g/dL (ref 30.0–36.0)
MCV: 100 fL (ref 78.0–100.0)
PLATELETS: 202 10*3/uL (ref 150–400)
RBC: 2.55 MIL/uL — AB (ref 3.87–5.11)
RDW: 13.9 % (ref 11.5–15.5)
WBC: 9.6 10*3/uL (ref 4.0–10.5)

## 2016-10-04 MED ORDER — HYDROCODONE-ACETAMINOPHEN 5-325 MG PO TABS: 1.0000 | ORAL_TABLET | Freq: Four times a day (QID) | ORAL | 0 refills | Status: DC | PRN

## 2016-10-04 NOTE — Progress Notes (Signed)
Pt is ready to return to [facility] today. Pt / family are in agreement with dc plan. [facility] transport required. Medical necessity form completed. DC Summary sent to SNF for review. Scripts included in [employer]. # for report provided to nsg.   Cori Razor LCSW [phone]

## 2016-10-04 NOTE — Progress Notes (Signed)
[facility] called back report given, phone disconnected during report. Unable to get nurses name. Sharrell Ku RN

## 2016-10-04 NOTE — Progress Notes (Signed)
Attempted to call report to [facility] to give report, receptionist stated they were having phone issues. Gave contact number so they could return call  D Susann Givens RN

## 2016-10-04 NOTE — Progress Notes (Signed)
   Subjective: 4 Days Post-Op Procedure(s) (LRB): OPEN REDUCTION INTERNAL FIXATION PERIPROSTHETIC HIP FRACTURE (Left) Patient reports pain as mild.   Patient seen in rounds with Dr. [name]. Patient is well, and has had no acute complaints or problems. No issues overnight. Patient awake and alert during rounds. Daughter in room as well.    Objective: Vital signs in last 24 hours: Temp:  [98 F (36.7 C)-98.3 F (36.8 C)] 98.3 F (36.8 C) (07/19 0947) Pulse Rate:  [64-98] 64 (07/19 0638) Resp:  [14-16] 14 (07/19 0962) BP: (106-120)/(48-57) 120/57 (07/19 0638) SpO2:  [95 %-96 %] 96 % (07/19 8366)  Intake/Output from previous day:  Intake/Output Summary (Last 24 hours) at 10/04/16 0731 Last data filed at 10/04/16 0600  Gross per 24 hour  Intake              420 ml  Output              100 ml  Net              320 ml     Labs:  Recent Labs  10/02/16 0739 10/03/16 0536 10/04/16 0522  HGB 8.2* 8.3* 8.1*    Recent Labs  10/03/16 0536 10/04/16 0522  WBC 10.4 9.6  RBC 2.22* 2.55*  HCT 22.8* 25.5*  PLT 175 202    Recent Labs  10/03/16 0536 10/04/16 0522  NA 141 141  K 4.1 3.9  CL 103 104  CO2 27 29  BUN 46* 44*  CREATININE 1.34* 1.09*  GLUCOSE 118* 109*  CALCIUM 8.1* 8.3*    EXAM General - Patient is Alert and Oriented Extremity - Neurologically intact Intact pulses distally Dorsiflexion/Plantar flexion intact No cellulitis present Compartment soft Dressing/Incision - clean, dry, no drainage Motor Function - intact, moving foot and toes well on exam.   Past Medical History:  Diagnosis Date  . CAD (coronary artery disease)   . Chronic low back pain   . Contusion of foot, right 05/15/12  . Diverticulitis   . Dyslipidemia   . H/O: hysterectomy   . HTN (hypertension)   . Memory disturbance 05/2012   MMSE 26/30, intact Clock test  . Nonischemic cardiomyopathy (HCC)   . Osteoporosis   . Pulmonary embolism (HCC)   . Right knee pain 05/15/12  .  Spinal stenosis   . Ulcer disease   . Weight loss     Assessment/Plan: 4 Days Post-Op Procedure(s) (LRB): OPEN REDUCTION INTERNAL FIXATION PERIPROSTHETIC HIP FRACTURE (Left) Principal Problem:   Periprosthetic supracondylar fracture of femur Active Problems:   Hip fracture (HCC)  Estimated body mass index is 29.26 kg/m as calculated from the following:   Height as of this encounter: 5\' 2"  (1.575 m).   Weight as of this encounter: 72.6 kg (160 lb). Advance diet Up with therapy Discharge to SNF when medically stable  DVT Prophylaxis - Lovenox Nonweightbearing left LE  Patient appears much more alert today. Will have her get up with therapy today. Will continue to monitor labs but Hgb appears to be stable. Plan for DC back to [facility] once medically stable. Follow up in 2 weeks with Dr. [name].   Dimitri Ped, PA-C Orthopaedic Surgery 10/04/2016, 7:31 AM

## 2016-10-04 NOTE — Progress Notes (Signed)
Patient d/c to [facility] via [facility]. Stable.

## 2016-10-04 NOTE — Discharge Summary (Signed)
Physician Discharge Summary  [name] UJW:[id number] DOB: Jul 28, 1920 DOA: 09/29/2016  PCP: Merlene Laughter, MD  Admit date: 09/29/2016 Discharge date: 10/04/2016  Recommendations for Outpatient Follow-up:  1. Pt will need to follow up with PCP in 1 week post discharge 2. Please obtain BMP to evaluate electrolytes and kidney function 3. Please also check CBC to evaluate Hg and Hct levels  Discharge Diagnoses:  Principal Problem:   Periprosthetic supracondylar fracture of femur Active Problems:   Hip fracture [facility])  Discharge Condition: Stable  Diet recommendation: Heart healthy diet discussed in details   History of present illness:  This is a 83 year old female with moderate to advanced dementia who presented to the hospital after a mechanical fall, sustained distal femoral shaft fracture and underwent ORIF.   Assessment & Plan: Distal femoral shaft fracture, on the left - s/p left hip ORIF, post op day #4 - PT eval done, SNF recommended, pt will be discharged today and family agrees  - pt had better night per daughter at bedside  - DVT prophylaxis : Lovenox SQ per ortho - NWB on LLE but allow gentle ROM left knee - allow analgesia as needed    Acute kidney injury - suspect post op related - lasix was held and Cr is trending down - OK to resume Lasix per previous home medical regimen and close outpatient follow up  Leukocytosis - suspect related to reactive process, demargination and stress reaction post op - resolved   Post op Hg drop, thrombocytopenia  - hg drop noted post op  - no evidence of active bleeding - Hg overall stable in the past 48 hours - Plt are now WNL  Hypertension, essential - continue Coreg  Symptomatic bradycardia, sinus node dysfunction,status post pacemaker insertion - continue Coreg per home medical regimen   Coronary artery disease - no anginal concerns   Nonischemic cardiomyopathy with systolic heart failure - EF <  35% - continue Coreg, avoid IVF as pt is now euvolemic  - monitor weights, I/O  Depression/ dementia - continue Zoloft per home medical regimen   DVT prophylaxis: Lovenox SQ Code Status: DNR Family Communication: daughter at bedside  Disposition Plan: SNF   Consultants:   Ortho  PT  Procedures:   Left ORIF, femur  Antimicrobials:   None   Procedures/Studies: Dg Pelvis 1-2 Views  Result Date: 09/29/2016 CLINICAL DATA:  Left thigh pain following a fall today. EXAM: PELVIS - 1-2 VIEW COMPARISON:  04/05/2014. FINDINGS: Stable left hip bipolar prosthesis. Old, healed bilateral pubic fractures. No acute fracture or dislocation. 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 Lower lumbar spine degenerative changes. IMPRESSION: 1. No acute fracture or dislocation. 2. 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 Electronically Signed   By: Beckie Salts M.D.   On: 09/29/2016 15:00   Dg Tibia/fibula Left  Result Date: 09/29/2016 CLINICAL DATA:  Left leg pain following a fall today. EXAM: LEFT TIBIA AND FIBULA - 2 VIEW COMPARISON:  None. FINDINGS: Left total knee prosthesis. No fracture or dislocation seen. The images at the level of the ankle are suboptimally positioned due to a femur fracture. Tarsal degenerative changes are noted in the foot. IMPRESSION: No lower leg fracture or dislocation seen. Electronically Signed   By: Beckie Salts M.D.   On: 09/29/2016 15:04   Ct Head Wo Contrast  Result Date: 09/29/2016 CLINICAL DATA:  [facility], denies loss of consciousness, history hypertension, non ischemic cardiomyopathy, coronary artery disease EXAM: CT HEAD  WITHOUT CONTRAST CT CERVICAL SPINE WITHOUT CONTRAST TECHNIQUE: Multidetector CT imaging of the head and cervical spine was performed following the standard protocol without intravenous contrast. Multiplanar CT image reconstructions of the cervical spine were also generated. COMPARISON:  04/04/2014  FINDINGS: CT HEAD FINDINGS Brain: Generalized atrophy. Normal ventricular morphology. No midline shift or mass effect. Small vessel chronic ischemic changes of deep cerebral white matter. No intracranial hemorrhage, mass lesion, evidence of acute infarction, or extra-axial fluid collection. Vascular: Atherosclerotic calcifications of internal carotid arteries at skullbase Skull: Osseous demineralization.  Calvaria intact. Sinuses/Orbits: Chronic opacification of portions of the RIGHT mastoid air cells. Remaining sinuses and LEFT mastoid air cells clear Other: N/A CT CERVICAL SPINE FINDINGS Alignment: Normal Skull base and vertebrae: Osseous demineralization. Visualized skullbase intact. Vertebral body heights maintained without fracture or bone destruction. Multilevel facet degenerative changes bilaterally. Ankylosis of C2-C3 facet joints. Soft tissues and spinal canal: Prevertebral soft tissues normal thickness. Atherosclerotic calcifications at the carotid bifurcations. Disc levels: Mild scattered disc space narrowing. Spinal canal patent. Beam hardening artifacts from patient shoulders. Upper chest: Visualized lung apices clear. Other: N/A IMPRESSION: Atrophy with small vessel chronic ischemic changes of deep cerebral white matter. No acute intracranial abnormalities. Degenerative disc and facet disease changes cervical spine. No acute cervical spine abnormalities. Electronically Signed   By: Ulyses Southward M.D.   On: 09/29/2016 14:34   Ct Cervical Spine Wo Contrast  Result Date: 09/29/2016 CLINICAL DATA:  Larey Seat today at [facility], denies loss of consciousness, history hypertension, non ischemic cardiomyopathy, coronary artery disease EXAM: CT HEAD WITHOUT CONTRAST CT CERVICAL SPINE WITHOUT CONTRAST TECHNIQUE: Multidetector CT imaging of the head and cervical spine was performed following the standard protocol without intravenous contrast. Multiplanar CT image reconstructions of the cervical spine were  also generated. COMPARISON:  04/04/2014 FINDINGS: CT HEAD FINDINGS Brain: Generalized atrophy. Normal ventricular morphology. No midline shift or mass effect. Small vessel chronic ischemic changes of deep cerebral white matter. No intracranial hemorrhage, mass lesion, evidence of acute infarction, or extra-axial fluid collection. Vascular: Atherosclerotic calcifications of internal carotid arteries at skullbase Skull: Osseous demineralization.  Calvaria intact. Sinuses/Orbits: Chronic opacification of portions of the RIGHT mastoid air cells. Remaining sinuses and LEFT mastoid air cells clear Other: N/A CT CERVICAL SPINE FINDINGS Alignment: Normal Skull base and vertebrae: Osseous demineralization. Visualized skullbase intact. Vertebral body heights maintained without fracture or bone destruction. Multilevel facet degenerative changes bilaterally. Ankylosis of C2-C3 facet joints. Soft tissues and spinal canal: Prevertebral soft tissues normal thickness. Atherosclerotic calcifications at the carotid bifurcations. Disc levels: Mild scattered disc space narrowing. Spinal canal patent. Beam hardening artifacts from patient shoulders. Upper chest: Visualized lung apices clear. Other: N/A IMPRESSION: Atrophy with small vessel chronic ischemic changes of deep cerebral white matter. No acute intracranial abnormalities. Degenerative disc and facet disease changes cervical spine. No acute cervical spine abnormalities. Electronically Signed   By: Ulyses Southward M.D.   On: 09/29/2016 14:34   Dg Femur Min 2 Views Left  Result Date: 09/30/2016 CLINICAL DATA:  Internal fixation EXAM: LEFT FEMUR 2 VIEWS COMPARISON:  09/29/2016 FINDINGS: Changes of prior left hip and left knee replacement. Plate, screw and cerclage fixation noted across the distal femoral shaft fracture. Near anatomic alignment. IMPRESSION: Internal fixation of the distal femoral fracture with near anatomic alignment. Electronically Signed   By: Charlett Nose M.D.    On: 09/30/2016 09:47   Dg Femur Min 2 Views Left  Result Date: 09/29/2016 CLINICAL DATA:  Left thigh pain following a fall  today. EXAM: LEFT FEMUR 2 VIEWS COMPARISON:  None. FINDINGS: Oblique fracture of the distal femoral shaft with almost 1 shaft width of medial displacement and medial angulation of the distal fragment. There is also posterior angulation of the distal fragment. The fracture is mildly comminuted with 4 cm of overlapping of the fragments. Left knee and left hip prostheses are noted as well as atheromatous arterial calcifications. IMPRESSION: Distal femoral shaft fracture, as described above. Electronically Signed   By: Beckie Salts M.D.   On: 09/29/2016 15:03     Discharge Exam: Vitals:   10/03/16 2254 10/04/16 0638  BP: (!) 111/52 (!) 120/57  Pulse: 67 64  Resp: 14 14  Temp: 98.1 F (36.7 C) 98.3 F (36.8 C)   Vitals:   10/03/16 1319 10/03/16 1804 10/03/16 2254 10/04/16 0638  BP: (!) 110/54 (!) 106/48 (!) 111/52 (!) 120/57  Pulse: 98 65 67 64  Resp: 16 16 14 14   Temp: 98 F (36.7 C)  98.1 F (36.7 C) 98.3 F (36.8 C)  TempSrc: Oral  Oral Oral  SpO2: 95%  95% 96%  Weight:      Height:        General: Pt is alert, follows commands appropriately, not in acute distress Cardiovascular: Regular rate and rhythm, no rubs, no gallops Respiratory: Clear to auscultation bilaterally, no wheezing, no crackles, no rhonchi Abdominal: Soft, non tender, non distended, bowel sounds +, no guarding Extremities: no cyanosis, pulses palpable bilaterally DP and PT  Discharge Instructions  Discharge Instructions    Non weight bearing    Complete by:  As directed    Laterality:  left   Extremity:  Lower     Allergies as of 10/04/2016      Reactions   Arthrotec [diclofenac-misoprostol]    Ibuprofen    Lactose Intolerance (gi)    Milk-related Compounds Nausea And Vomiting   Cheese   Other    Grass and ragweed    Oxycontin [oxycodone Hcl]    Pollen Extract        Medication List    TAKE these medications   acetaminophen 325 MG tablet Commonly known as:  TYLENOL Take 2 tablets (650 mg total) by mouth every 6 (six) hours as needed for mild pain (or Fever >/= 101). What changed:  when to take this  additional instructions   albuterol 108 (90 Base) MCG/ACT inhaler Commonly known as:  PROVENTIL HFA;VENTOLIN HFA Inhale 1-2 puffs into the lungs every 6 (six) hours as needed for wheezing. What changed:  how much to take   allopurinol 100 MG tablet Commonly known as:  ZYLOPRIM Take 100 mg by mouth daily.   carvedilol 3.125 MG tablet Commonly known as:  COREG Take 1.5625 mg by mouth 2 (two) times daily with a meal.   cyanocobalamin 1000 MCG tablet Take 1,000 mcg by mouth daily.   DESITIN Oint Place 1 application rectally as directed. Apply after each bathroom use   docusate 50 MG/5ML liquid Commonly known as:  COLACE Take 100 mg by mouth daily.   enoxaparin 30 MG/0.3ML injection Commonly known as:  LOVENOX Inject 0.3 mLs (30 mg total) into the skin daily.   furosemide 20 MG tablet Commonly known as:  LASIX Take 20 mg by mouth every other day. Alternate with 40mg  lasix   furosemide 40 MG tablet Commonly known as:  LASIX Take 40 mg by mouth every other day. Alternate with 20mg  lasix   HYDROcodone-acetaminophen 5-325 MG tablet Commonly known as:  NORCO/VICODIN  Take 1-2 tablets by mouth every 6 (six) hours as needed for moderate pain.   nitroGLYCERIN 0.4 MG SL tablet Commonly known as:  NITROSTAT Place 0.4 mg under the tongue every 5 (five) minutes as needed for chest pain.   OPTIVE 0.5-0.9 % Soln Generic drug:  Carboxymethylcellul-Glycerin Apply 2 drops to eye 2 (two) times daily.   polyethylene glycol packet Commonly known as:  MIRALAX / GLYCOLAX Take 17 g by mouth daily. Hold for loose stools   ranitidine 150 MG tablet Commonly known as:  ZANTAC Take 150 mg by mouth at bedtime.   sertraline 25 MG tablet Commonly  known as:  ZOLOFT Take 25 mg by mouth daily.       Contact information for follow-up providers    Ollen Gross, MD Follow up in 2 week(s).   Specialty:  Orthopedic Surgery Contact information: [address] [place] [address] [phone]        Merlene Laughter, MD Follow up.   Specialty:  Internal Medicine Contact information: [address] [place] [address] [phone]            Contact information for after-discharge care    Destination    [facility] Follow up.   Specialty:  Skilled Nursing Facility Contact information: [address] [place] [address] [phone]                   The results of significant diagnostics from this hospitalization (including imaging, microbiology, ancillary and laboratory) are listed below for reference.     Microbiology: Recent Results (from the past 240 hour(s))  Surgical PCR screen     Status: None   Collection Time: 09/30/16  3:33 AM  Result Value Ref Range Status   MRSA, PCR NEGATIVE NEGATIVE Final   Staphylococcus aureus NEGATIVE NEGATIVE Final    Comment:        The Xpert SA Assay (FDA approved for NASAL specimens in patients over 61 years of age), is one component of a comprehensive surveillance program.  Test performance has been validated by [facility] for patients greater than or equal to 63 year old. It is not intended to diagnose infection nor to guide or monitor treatment.      Labs: Basic Metabolic Panel:  Recent Labs Lab 09/30/16 0538 10/01/16 0516 10/02/16 0739 10/03/16 0536 10/04/16 0522  NA 140 138 139 141 141  K 4.6 4.4 3.6 4.1 3.9  CL 102 101 103 103 104  CO2 28 27 28 27 29   GLUCOSE 135* 126* 113* 118* 109*  BUN 29* 35* 45* 46* 44*  CREATININE 1.18* 1.50* 1.61* 1.34* 1.09*  CALCIUM 8.5* 8.2* 8.0* 8.1* 8.3*   Liver Function Tests:  Recent Labs Lab 09/30/16 0538  AST 28   ALT 8*  ALKPHOS 57  BILITOT 1.4*  PROT 6.5  ALBUMIN 3.6   CBC:  Recent Labs Lab 09/29/16 1521 09/30/16 0538 09/30/16 1313 10/01/16 0516 10/02/16 0739 10/03/16 0536 10/04/16 0522  WBC 11.8* 11.4*  --  14.2* 9.9 10.4 9.6  NEUTROABS 8.9*  --   --  11.1*  --   --   --   HGB 12.5 11.0* 9.6* 9.0* 8.2* 8.3* 8.1*  HCT 36.5 32.8* 29.4* 27.8* 25.6* 22.8* 25.5*  MCV 101.7* 101.2*  --  95.9 97.3 102.7* 100.0  PLT 203 174  --  157 144* 175 202    SIGNED: Time coordinating discharge: 50 minutes  Alexias Margerum Magick-Jenah Vanasten,  MD  [facility] 10/04/2016, 9:55 AM Pager [phone]  If 7PM-7AM, please contact night-coverage [email or web address] Password [email or web address]

## 2016-10-08 DIAGNOSIS — R0602 Shortness of breath: Secondary | ICD-10-CM | POA: Diagnosis not present

## 2016-10-08 DIAGNOSIS — R509 Fever, unspecified: Secondary | ICD-10-CM | POA: Diagnosis not present

## 2016-10-08 DIAGNOSIS — R062 Wheezing: Secondary | ICD-10-CM | POA: Diagnosis not present

## 2016-10-09 DIAGNOSIS — D649 Anemia, unspecified: Secondary | ICD-10-CM | POA: Diagnosis not present

## 2016-10-09 DIAGNOSIS — S8990XA Unspecified injury of unspecified lower leg, initial encounter: Secondary | ICD-10-CM | POA: Diagnosis not present

## 2016-10-09 DIAGNOSIS — S72001A Fracture of unspecified part of neck of right femur, initial encounter for closed fracture: Secondary | ICD-10-CM | POA: Diagnosis not present

## 2016-10-09 DIAGNOSIS — I129 Hypertensive chronic kidney disease with stage 1 through stage 4 chronic kidney disease, or unspecified chronic kidney disease: Secondary | ICD-10-CM | POA: Diagnosis not present

## 2016-10-09 DIAGNOSIS — S92153A Displaced avulsion fracture (chip fracture) of unspecified talus, initial encounter for closed fracture: Secondary | ICD-10-CM | POA: Diagnosis not present

## 2016-10-09 DIAGNOSIS — I509 Heart failure, unspecified: Secondary | ICD-10-CM | POA: Diagnosis not present

## 2016-10-09 LAB — HEPATIC FUNCTION PANEL
ALT: 6 — AB (ref 7–35)
AST: 18 (ref 13–35)
Alkaline Phosphatase: 71 (ref 25–125)
BILIRUBIN, TOTAL: 0.9

## 2016-10-09 LAB — BASIC METABOLIC PANEL
BUN: 29 — AB (ref 4–21)
Creatinine: 1.1 (ref 0.5–1.1)
GLUCOSE: 113
POTASSIUM: 4 (ref 3.4–5.3)
Sodium: 141 (ref 137–147)

## 2016-10-09 LAB — CBC AND DIFFERENTIAL
HCT: 28 — AB (ref 36–46)
Hemoglobin: 9.4 — AB (ref 12.0–16.0)
Platelets: 383 (ref 150–399)
WBC: 9.5

## 2016-10-10 DIAGNOSIS — R2689 Other abnormalities of gait and mobility: Secondary | ICD-10-CM | POA: Diagnosis not present

## 2016-10-10 DIAGNOSIS — Z4789 Encounter for other orthopedic aftercare: Secondary | ICD-10-CM | POA: Diagnosis not present

## 2016-10-10 DIAGNOSIS — S72001S Fracture of unspecified part of neck of right femur, sequela: Secondary | ICD-10-CM | POA: Diagnosis not present

## 2016-10-10 DIAGNOSIS — R278 Other lack of coordination: Secondary | ICD-10-CM | POA: Diagnosis not present

## 2016-10-10 DIAGNOSIS — R319 Hematuria, unspecified: Secondary | ICD-10-CM | POA: Diagnosis not present

## 2016-10-10 DIAGNOSIS — Z79899 Other long term (current) drug therapy: Secondary | ICD-10-CM | POA: Diagnosis not present

## 2016-10-10 DIAGNOSIS — N390 Urinary tract infection, site not specified: Secondary | ICD-10-CM | POA: Diagnosis not present

## 2016-10-10 LAB — TSH: TSH: 4.58 (ref 0.41–5.90)

## 2016-10-11 DIAGNOSIS — S72001S Fracture of unspecified part of neck of right femur, sequela: Secondary | ICD-10-CM | POA: Diagnosis not present

## 2016-10-11 DIAGNOSIS — Z4789 Encounter for other orthopedic aftercare: Secondary | ICD-10-CM | POA: Diagnosis not present

## 2016-10-11 DIAGNOSIS — R2689 Other abnormalities of gait and mobility: Secondary | ICD-10-CM | POA: Diagnosis not present

## 2016-10-11 DIAGNOSIS — R278 Other lack of coordination: Secondary | ICD-10-CM | POA: Diagnosis not present

## 2016-10-12 DIAGNOSIS — R278 Other lack of coordination: Secondary | ICD-10-CM | POA: Diagnosis not present

## 2016-10-12 DIAGNOSIS — S72001S Fracture of unspecified part of neck of right femur, sequela: Secondary | ICD-10-CM | POA: Diagnosis not present

## 2016-10-12 DIAGNOSIS — Z4789 Encounter for other orthopedic aftercare: Secondary | ICD-10-CM | POA: Diagnosis not present

## 2016-10-12 DIAGNOSIS — R2689 Other abnormalities of gait and mobility: Secondary | ICD-10-CM | POA: Diagnosis not present

## 2016-10-15 DIAGNOSIS — R2689 Other abnormalities of gait and mobility: Secondary | ICD-10-CM | POA: Diagnosis not present

## 2016-10-15 DIAGNOSIS — Z4789 Encounter for other orthopedic aftercare: Secondary | ICD-10-CM | POA: Diagnosis not present

## 2016-10-15 DIAGNOSIS — S72001S Fracture of unspecified part of neck of right femur, sequela: Secondary | ICD-10-CM | POA: Diagnosis not present

## 2016-10-15 DIAGNOSIS — R278 Other lack of coordination: Secondary | ICD-10-CM | POA: Diagnosis not present

## 2016-10-16 ENCOUNTER — Encounter: Payer: Self-pay | Admitting: Internal Medicine

## 2016-10-16 ENCOUNTER — Non-Acute Institutional Stay (SKILLED_NURSING_FACILITY): Payer: Medicare Other | Admitting: Internal Medicine

## 2016-10-16 DIAGNOSIS — R0902 Hypoxemia: Secondary | ICD-10-CM | POA: Diagnosis not present

## 2016-10-16 DIAGNOSIS — R410 Disorientation, unspecified: Secondary | ICD-10-CM

## 2016-10-16 DIAGNOSIS — I495 Sick sinus syndrome: Secondary | ICD-10-CM | POA: Diagnosis not present

## 2016-10-16 DIAGNOSIS — I5022 Chronic systolic (congestive) heart failure: Secondary | ICD-10-CM | POA: Diagnosis not present

## 2016-10-16 DIAGNOSIS — Z7409 Other reduced mobility: Secondary | ICD-10-CM | POA: Diagnosis not present

## 2016-10-16 DIAGNOSIS — F0390 Unspecified dementia without behavioral disturbance: Secondary | ICD-10-CM | POA: Insufficient documentation

## 2016-10-16 DIAGNOSIS — D62 Acute posthemorrhagic anemia: Secondary | ICD-10-CM | POA: Diagnosis not present

## 2016-10-16 DIAGNOSIS — Z4789 Encounter for other orthopedic aftercare: Secondary | ICD-10-CM | POA: Diagnosis not present

## 2016-10-16 DIAGNOSIS — S72001S Fracture of unspecified part of neck of right femur, sequela: Secondary | ICD-10-CM | POA: Diagnosis not present

## 2016-10-16 DIAGNOSIS — Z950 Presence of cardiac pacemaker: Secondary | ICD-10-CM

## 2016-10-16 DIAGNOSIS — Z96659 Presence of unspecified artificial knee joint: Secondary | ICD-10-CM

## 2016-10-16 DIAGNOSIS — E785 Hyperlipidemia, unspecified: Secondary | ICD-10-CM

## 2016-10-16 DIAGNOSIS — M978XXA Periprosthetic fracture around other internal prosthetic joint, initial encounter: Secondary | ICD-10-CM | POA: Diagnosis not present

## 2016-10-16 DIAGNOSIS — I428 Other cardiomyopathies: Secondary | ICD-10-CM | POA: Insufficient documentation

## 2016-10-16 DIAGNOSIS — M978XXD Periprosthetic fracture around other internal prosthetic joint, subsequent encounter: Secondary | ICD-10-CM | POA: Diagnosis not present

## 2016-10-16 DIAGNOSIS — R278 Other lack of coordination: Secondary | ICD-10-CM | POA: Diagnosis not present

## 2016-10-16 DIAGNOSIS — R2689 Other abnormalities of gait and mobility: Secondary | ICD-10-CM | POA: Diagnosis not present

## 2016-10-16 DIAGNOSIS — R1310 Dysphagia, unspecified: Secondary | ICD-10-CM

## 2016-10-16 HISTORY — DX: Unspecified dementia, unspecified severity, without behavioral disturbance, psychotic disturbance, mood disturbance, and anxiety: F03.90

## 2016-10-16 NOTE — Progress Notes (Signed)
Patient ID: [name], female   DOB: 1920/10/20, 83 y.o.   MRN: [id number]  Location:  [facility] Room Number: 111 Place of Service:  SNF ([phone]) Provider:   [name], DO  Patient Care Team: Christine Balo, DO as PCP - General (Geriatric Medicine) Christine Anon, NP as Nurse Practitioner (Nurse Practitioner)  Extended Emergency Contact Information Primary Emergency Contact: [name] Address: [address]          [place], [place] [address] [name] Home Phone: [phone] Relation: Other Secondary Emergency Contact: [name] [place] Home Phone: [phone] Relation: Daughter  Code Status:  DNR Goals of care: Advanced Directive information Advanced Directives 10/16/2016  Does Patient Have a Medical Advance Directive? Yes  Type of Advance Directive Out of facility DNR (pink MOST or yellow form);Living will;Healthcare Power of Attorney  Does patient want to make changes to medical advance directive? -  Copy of Healthcare Power of Attorney in Chart? Yes  Pre-existing out of facility DNR order (yellow form or pink MOST form) Yellow form placed in chart (order not valid for inpatient use)   Chief Complaint  Patient presents with  . Medical Management of Chronic Issues    routine visit  . Establish Care    patient changing to [facility] from [name]    HPI:  Pt is a 83 y.o. female seen today for medical management of chronic diseases and to establish care officially with [facility].  Pt was seen in her room today and her son was present for the visit.  I have seen [name] in rehab in feb of 2016 after a left hip fracture.  Due to a fall with periprosthetic left hip fracture, she has being much less mobile and her family has decided to transfer her care to avoid transportation outside of the facility most of the time.    She has a h/o CAD, chronic low back pain,  diverticulosis, hyperlipidemia, sick sinus syndrome with a pacemaker in place, nonischemic cardiomyopathy with EF<35%, "memory disorder" (AKA dementia) and depression.  She sustained a fall on 7/14 with left periprosthetic supracondylar fracture of her femur.  She was admitted to the hospital and eventually taken to the OR 7/15 by Dr. [name] for an ORIF of this fracture.  She has been nonweightbearing since and in an immobilizer.  She's been on lovenox for DVT prophylaxis (10 doses ordered at hospital d/c) and hydrocodone and tylenol for pain.  She had used just one hydrocoone recently and one was given when I was there b/c of the plans for her to go out to see Dr. [name] in f/u today.    Her hospital stay as complicated by acute renal failure which occurred postop and improved with holding lasix (resumed at d/c) with correction of cr back to 10.9.  She had some leukocytosis which was felt to be an acute phase reactant.  She had postop anemia to 8.1 hgb.  BP was controlled with coreg. Due to her confusion upon return to [facility], labs and UA were done as ordered by [name].  UA was negative ucx only grew 25K bacteria, but she was treated with cipro due to her delirium.  She also had a chest xray due to low grade temp, hypoxia and the delirium, but it was negative for pneumonia.  She's been wearing O2 since returning from the hospital.  Incentive spirometer is on bedside but not ordered.  No recent fever.  [name] also had temporarily increased her lasix also for the hypoxia.    Pt was pleasant and conversant though tangential and difficult to follow.  SNF nurse came in to give her her pain pill before her appt with Dr. [name], and pt had great difficulty swallowing this pill.  She did better with applesauce after not swallowing with 5 or so tries with water and juice.  Normally, her meds must be crushed.  Her son reports eating and taking pills have been the biggest struggles lately.  Past  Medical History:  Diagnosis Date  . CAD (coronary artery disease)   . Chronic low back pain   . Contusion of foot, right 05/15/12  . Diverticulitis   . Dyslipidemia   . H/O: hysterectomy   . HTN (hypertension)   . Memory disturbance 05/2012   MMSE 26/30, intact Clock test  . Nonischemic cardiomyopathy (HCC)   . Osteoporosis   . Pulmonary embolism (HCC)   . Right knee pain 05/15/12  . Spinal stenosis   . Ulcer disease   . Weight loss    Past Surgical History:  Procedure Laterality Date  . APPENDECTOMY  2010  . BACK SURGERY    . BIV ICD GENERTAOR CHANGE OUT N/A 05/24/2011   Procedure: BIV ICD GENERTAOR CHANGE OUT;  Surgeon: [name], MD;  Location: [facility] CATH LAB;  Service: Cardiovascular;  Laterality: N/A;  . CATARACT EXTRACTION    . COLON RESECTION  2005  . CORONARY ANGIOPLASTY WITH STENT PLACEMENT  01/2003  . HEMICOLECTOMY    . HIP ARTHROPLASTY Left 04/05/2014   Procedure: ARTHROPLASTY BIPOLAR HIP;  Surgeon: Shelda Pal, MD;  Location: [facility] ORS;  Service: Orthopedics;  Laterality: Left;  . left shoulder replacement  1991   Dr. [name]  . ORIF HIP FRACTURE Left 09/30/2016   Procedure: OPEN REDUCTION INTERNAL FIXATION PERIPROSTHETIC HIP FRACTURE;  Surgeon: Ollen Gross, MD;  Location: [facility] ORS;  Service: Orthopedics;  Laterality: Left;  . right shoulder repair  1985   Dr. [name]  . rotator cuff debridement  1999   Dr. [name]    Allergies  Allergen Reactions  . Arthrotec [Diclofenac-Misoprostol]   . Ibuprofen   . Lactose Intolerance (Gi)   . Milk-Related Compounds Nausea And Vomiting    Cheese  . Other     Grass and ragweed   . Oxycontin [Oxycodone Hcl]   . Pollen Extract     Outpatient Encounter Prescriptions as of 10/16/2016  Medication Sig  . acetaminophen (TYLENOL) 500 MG tablet Take 500 mg by mouth 3 (three) times daily.  Marland Kitchen albuterol (PROVENTIL HFA;VENTOLIN HFA) 108 (90 Base) MCG/ACT inhaler Inhale 2 puffs into the lungs every 6 (six) hours as needed for wheezing or  shortness of breath.  . allopurinol (ZYLOPRIM) 100 MG tablet Take 100 mg by mouth daily.  . Carboxymethylcellul-Glycerin (OPTIVE) 0.5-0.9 % SOLN Apply 2 drops to eye 2 (two) times daily.  . carvedilol (COREG) 3.125 MG tablet Take 1.5625 mg by mouth 2 (two) times daily with a meal.   . ciprofloxacin (CIPRO) 250 MG tablet Take 250 mg by mouth 2 (two) times daily.  . cyanocobalamin 1000 MCG tablet Take 1,000 mcg by mouth daily.   . Diaper Rash Products (DESITIN) OINT Place 1 application rectally as directed. Apply after each bathroom use  . docusate (COLACE) 50 MG/5ML liquid Take 100 mg by mouth daily.  Marland Kitchen enoxaparin (LOVENOX) 30 MG/0.3ML injection Inject 0.3 mLs (30 mg total) into the  skin daily.  . furosemide (LASIX) 20 MG tablet Take 20 mg by mouth every other day. Alternate with 40mg  lasix  . furosemide (LASIX) 40 MG tablet Take 40 mg by mouth every other day. Alternate with 20mg  lasix  . ipratropium-albuterol (DUONEB) 0.5-2.5 (3) MG/3ML SOLN Take 3 mLs by nebulization every 6 (six) hours as needed.  . nitroGLYCERIN (NITROSTAT) 0.4 MG SL tablet Place 0.4 mg under the tongue every 5 (five) minutes as needed for chest pain.  . polyethylene glycol (MIRALAX / GLYCOLAX) packet Take 17 g by mouth daily. Hold for loose stools  . ranitidine (ZANTAC) 150 MG tablet Take 150 mg by mouth at bedtime.  . sertraline (ZOLOFT) 25 MG tablet Take 25 mg by mouth daily.  . [DISCONTINUED] acetaminophen (TYLENOL) 325 MG tablet Take 2 tablets (650 mg total) by mouth every 6 (six) hours as needed for mild pain (or Fever >/= 101). (Patient taking differently: Take 650 mg by mouth 2 (two) times daily. Take 650 mg every 6 hours as needed for mild pain or fever >/= 101)  . [DISCONTINUED] albuterol (PROVENTIL HFA;VENTOLIN HFA) 108 (90 BASE) MCG/ACT inhaler Inhale 1-2 puffs into the lungs every 6 (six) hours as needed for wheezing. (Patient taking differently: Inhale 2 puffs into the lungs every 6 (six) hours as needed for  wheezing. )  . [DISCONTINUED] HYDROcodone-acetaminophen (NORCO/VICODIN) 5-325 MG tablet Take 1-2 tablets by mouth every 6 (six) hours as needed for moderate pain.   No facility-administered encounter medications on file as of 10/16/2016.     Review of Systems  Reason unable to perform ROS: per son and nursing as pt difficult to understand and poor historian.  Constitutional: Positive for activity change and fatigue. Negative for appetite change, chills, fever and unexpected weight change.  HENT: Positive for hearing loss. Negative for congestion.   Eyes: Negative for visual disturbance.  Respiratory: Negative for chest tightness and shortness of breath.   Cardiovascular: Negative for chest pain and leg swelling.  Gastrointestinal: Positive for constipation and nausea. Negative for abdominal distention, abdominal pain, blood in stool and vomiting.  Genitourinary: Negative for dysuria.  Musculoskeletal: Positive for arthralgias, back pain and gait problem.  Skin: Positive for pallor.  Neurological: Positive for weakness. Negative for dizziness.  Hematological: Bruises/bleeds easily.  Psychiatric/Behavioral: Positive for confusion. Negative for behavioral problems and sleep disturbance. The patient is not nervous/anxious.     Immunization History  Administered Date(s) Administered  . Influenza Inj Mdck Quad Pf 01/05/2016   Pertinent  Health Maintenance Due  Topic Date Due  . DEXA SCAN  05/18/1987  . PNA vac Low Risk Adult (1 of 2 - PCV13) 05/18/1987   No flowsheet data found. Functional Status Survey:    Vitals:   10/16/16 1354  BP: 107/71  Pulse: 85  Resp: 20  Temp: 98.6 F (37 C)  TempSrc: Oral  SpO2: (!) 87%  Weight: 174 lb (78.9 kg)   Body mass index is 31.83 kg/m. Physical Exam  Constitutional: She appears well-developed and well-nourished. No distress.  HENT:  Head: Normocephalic.  Right Ear: External ear normal.  Left Ear: External ear normal.  Nose: Nose  normal.  Mouth/Throat: Oropharynx is clear and moist.  Neck: No JVD present.  Cardiovascular: Normal rate, regular rhythm, normal heart sounds and intact distal pulses.   Pulmonary/Chest: Effort normal and breath sounds normal. No respiratory distress. She has no wheezes.  Abdominal: Soft. Bowel sounds are normal. She exhibits no distension and no mass. There is no tenderness.  There is no rebound and no guarding.  Musculoskeletal:  Wearing immobiizer on left leg  Lymphadenopathy:    She has no cervical adenopathy.  Neurological: She exhibits normal muscle tone.  Easily drifts to sleep, slightly dysarthric, resting in bed and grabbing at sheet repeatedly  Skin: Skin is warm and dry. There is pallor.  Psychiatric: She has a normal mood and affect.  Very pleasant but confused    Labs reviewed:  Recent Labs  10/02/16 0739 10/03/16 0536 10/04/16 0522 10/09/16  NA 139 141 141 141  K 3.6 4.1 3.9 4.0  CL 103 103 104  --   CO2 28 27 29   --   GLUCOSE 113* 118* 109*  --   BUN 45* 46* 44* 29*  CREATININE 1.61* 1.34* 1.09* 1.1  CALCIUM 8.0* 8.1* 8.3*  --     Recent Labs  09/30/16 0538 10/09/16  AST 28 18  ALT 8* 6*  ALKPHOS 57 71  BILITOT 1.4*  --   PROT 6.5  --   ALBUMIN 3.6  --     Recent Labs  09/29/16 1521  10/01/16 0516 10/02/16 0739 10/03/16 0536 10/04/16 0522 10/09/16  WBC 11.8*  < > 14.2* 9.9 10.4 9.6 9.5  NEUTROABS 8.9*  --  11.1*  --   --   --   --   HGB 12.5  < > 9.0* 8.2* 8.3* 8.1* 9.4*  HCT 36.5  < > 27.8* 25.6* 22.8* 25.5* 28*  MCV 101.7*  < > 95.9 97.3 102.7* 100.0  --   PLT 203  < > 157 144* 175 202 383  < > = values in this interval not displayed. Lab Results  Component Value Date   TSH 1.266 04/06/2014   No results found for: HGBA1C Lab Results  Component Value Date   CHOL  12/12/2009    161        ATP III CLASSIFICATION:  <200     mg/dL   Desirable  161-096  mg/dL   Borderline High  >=045    mg/dL   High          HDL 61 12/12/2009    LDLCALC  12/12/2009    83        Total Cholesterol/HDL:CHD Risk Coronary Heart Disease Risk Table                     Men   Women  1/2 Average Risk   3.4   3.3  Average Risk       5.0   4.4  2 X Average Risk   9.6   7.1  3 X Average Risk  23.4   11.0        Use the calculated Patient Ratio above and the CHD Risk Table to determine the patient's CHD Risk.        ATP III CLASSIFICATION (LDL):  <100     mg/dL   Optimal  409-811  mg/dL   Near or Above                    Optimal  130-159  mg/dL   Borderline  914-782  mg/dL   High  >956     mg/dL   Very High   TRIG 86 21/30/8657   CHOLHDL 2.6 12/12/2009    Significant Diagnostic Results in last 30 days:  Dg Pelvis 1-2 Views  Result Date: 09/29/2016 CLINICAL DATA:  Left thigh pain following a fall today. EXAM:  PELVIS - 1-2 VIEW COMPARISON:  04/05/2014. FINDINGS: Stable left hip bipolar prosthesis. Old, healed bilateral pubic fractures. No acute fracture or dislocation. 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 Henry lumbar spine degenerative changes. IMPRESSION: 1. No acute fracture or dislocation. 2. 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 Electronically Signed   By: Beckie Salts M.D.   On: 09/29/2016 15:00   Dg Tibia/fibula Left  Result Date: 09/29/2016 CLINICAL DATA:  Left leg pain following a fall today. EXAM: LEFT TIBIA AND FIBULA - 2 VIEW COMPARISON:  None. FINDINGS: Left total knee prosthesis. No fracture or dislocation seen. The images at the level of the ankle are suboptimally positioned due to a femur fracture. Tarsal degenerative changes are noted in the foot. IMPRESSION: No Henry leg fracture or dislocation seen. Electronically Signed   By: Beckie Salts M.D.   On: 09/29/2016 15:04   Ct Head Wo Contrast  Result Date: 09/29/2016 CLINICAL DATA:  [facility], denies loss of consciousness, history hypertension, non ischemic cardiomyopathy, coronary artery disease EXAM: CT HEAD WITHOUT  CONTRAST CT CERVICAL SPINE WITHOUT CONTRAST TECHNIQUE: Multidetector CT imaging of the head and cervical spine was performed following the standard protocol without intravenous contrast. Multiplanar CT image reconstructions of the cervical spine were also generated. COMPARISON:  04/04/2014 FINDINGS: CT HEAD FINDINGS Brain: Generalized atrophy. Normal ventricular morphology. No midline shift or mass effect. Small vessel chronic ischemic changes of deep cerebral white matter. No intracranial hemorrhage, mass lesion, evidence of acute infarction, or extra-axial fluid collection. Vascular: Atherosclerotic calcifications of internal carotid arteries at skullbase Skull: Osseous demineralization.  Calvaria intact. Sinuses/Orbits: Chronic opacification of portions of the RIGHT mastoid air cells. Remaining sinuses and LEFT mastoid air cells clear Other: N/A CT CERVICAL SPINE FINDINGS Alignment: Normal Skull base and vertebrae: Osseous demineralization. Visualized skullbase intact. Vertebral body heights maintained without fracture or bone destruction. Multilevel facet degenerative changes bilaterally. Ankylosis of C2-C3 facet joints. Soft tissues and spinal canal: Prevertebral soft tissues normal thickness. Atherosclerotic calcifications at the carotid bifurcations. Disc levels: Mild scattered disc space narrowing. Spinal canal patent. Beam hardening artifacts from patient shoulders. Upper chest: Visualized lung apices clear. Other: N/A IMPRESSION: Atrophy with small vessel chronic ischemic changes of deep cerebral white matter. No acute intracranial abnormalities. Degenerative disc and facet disease changes cervical spine. No acute cervical spine abnormalities. Electronically Signed   By: Ulyses Southward M.D.   On: 09/29/2016 14:34   Ct Cervical Spine Wo Contrast  Result Date: 09/29/2016 CLINICAL DATA:  [facility]y [facility]at [facility], denies loss of consciousness, history hypertension, non ischemic cardiomyopathy,  coronary artery disease EXAM: CT HEAD WITHOUT CONTRAST CT CERVICAL SPINE WITHOUT CONTRAST TECHNIQUE: Multidetector CT imaging of the head and cervical spine was performed following the standard protocol without intravenous contrast. Multiplanar CT image reconstructions of the cervical spine were also generated. COMPARISON:  04/04/2014 FINDINGS: CT HEAD FINDINGS Brain: Generalized atrophy. Normal ventricular morphology. No midline shift or mass effect. Small vessel chronic ischemic changes of deep cerebral white matter. No intracranial hemorrhage, mass lesion, evidence of acute infarction, or extra-axial fluid collection. Vascular: Atherosclerotic calcifications of internal carotid arteries at skullbase Skull: Osseous demineralization.  Calvaria intact. Sinuses/Orbits: Chronic opacification of portions of the RIGHT mastoid air cells. Remaining sinuses and LEFT mastoid air cells clear Other: N/A CT CERVICAL SPINE FINDINGS Alignment: Normal Skull base and vertebrae: Osseous demineralization. Visualized skullbase intact. Vertebral body heights maintained without fracture or bone destruction. Multilevel facet degenerative changes bilaterally. Ankylosis of C2-C3 facet  joints. Soft tissues and spinal canal: Prevertebral soft tissues normal thickness. Atherosclerotic calcifications at the carotid bifurcations. Disc levels: Mild scattered disc space narrowing. Spinal canal patent. Beam hardening artifacts from patient shoulders. Upper chest: Visualized lung apices clear. Other: N/A IMPRESSION: Atrophy with small vessel chronic ischemic changes of deep cerebral white matter. No acute intracranial abnormalities. Degenerative disc and facet disease changes cervical spine. No acute cervical spine abnormalities. Electronically Signed   By: Ulyses Southward M.D.   On: 09/29/2016 14:34   Dg Femur Min 2 Views Left  Result Date: 09/30/2016 CLINICAL DATA:  Internal fixation EXAM: LEFT FEMUR 2 VIEWS COMPARISON:  09/29/2016 FINDINGS:  Changes of prior left hip and left knee replacement. Plate, screw and cerclage fixation noted across the distal femoral shaft fracture. Near anatomic alignment. IMPRESSION: Internal fixation of the distal femoral fracture with near anatomic alignment. Electronically Signed   By: Charlett Nose M.D.   On: 09/30/2016 09:47   Dg Femur Min 2 Views Left  Result Date: 09/29/2016 CLINICAL DATA:  Left thigh pain following a fall today. EXAM: LEFT FEMUR 2 VIEWS COMPARISON:  None. FINDINGS: Oblique fracture of the distal femoral shaft with almost 1 shaft width of medial displacement and medial angulation of the distal fragment. There is also posterior angulation of the distal fragment. The fracture is mildly comminuted with 4 cm of overlapping of the fragments. Left knee and left hip prostheses are noted as well as atheromatous arterial calcifications. IMPRESSION: Distal femoral shaft fracture, as described above. Electronically Signed   By: Beckie Salts M.D.   On: 09/29/2016 15:03    Assessment/Plan 1. Delirium -since fall and hip fx -multifactorial:  Pain, pain meds, environmental changes, hypoxia, postop anemia, immobility -will try to reduce burden of potential causes -finishing abx for UTI, but did not have UTI based on labs reviewed, would not repeat urine--has plenty of other reasons she's delirious  2. Dementia without behavioral disturbance, unspecified dementia type -progressing, made worse by #1 in perioperative period -not on dementia medications, but was already requiring skilled care so benefit limited  3. Periprosthetic supracondylar fracture of femur, initial encounter -f/u with Dr. [name] today to determine if weightbearing status can change, if lovenox should continue or be changed -for now cont lovenox and knee immobilizer, NWB until he approves -d/c hydrocodone after today -schedule tylenol 500mg  po tid   4. Chronic systolic heart failure (HCC) -on lasix 20 alternating to 40mg ,  appears euvolemic on exam, last bmp wnl  5. Pacemaker -for sick sinus syndrome, interrogations wnl  6. Dyslipidemia -no longer on medication for cholesterol with advanced age, dementia  7. Postoperative anemia due to acute blood loss -f/u cbc again at next routine visit -ideally over 10 with her CAD status  8. Sick sinus syndrome (HCC) -s/p pacemaker, cont coreg low dose  9. Nonischemic cardiomyopathy (HCC) -stable with EF 35%, cont beta blocker, off ace/arb--bp soft   10. Immobility -due to NWB status after left hip fx, if approved by Dr. [name], would get her out of bed more to help with atelectasis -cont lovenox until d/cd by ortho  11.  Pill dysphagia -ST eval and tx   12.  Hypoxia and low grade temp -likely atelectasis related -incentive spirometry 10 breaths tid and get oob to chair more  Family/ staff Communication: discussed with pt's son, SNF nurse, DON  Labs/tests ordered:  ST eval and treat, incentive spirometry, no repeat UA  More than 35 mins were spent seeing patient, reviewing records from  previous PCP and hospital, paper snf chart, meeting with her son and coordinating care with SNF nursing staff.  Grazia Taffe L. Brocha Gilliam, D.O. Geriatrics [facility] [address], [place] [address] Cell Phone (Mon-Fri 8am-5pm):  [phone] On Call:  [phone] & follow prompts after 5pm & weekends Office Phone:  [phone] Office Fax:  [phone]

## 2016-10-25 ENCOUNTER — Non-Acute Institutional Stay (SKILLED_NURSING_FACILITY): Payer: Medicare Other | Admitting: Adult Health

## 2016-10-25 ENCOUNTER — Encounter: Payer: Self-pay | Admitting: Adult Health

## 2016-10-25 DIAGNOSIS — R410 Disorientation, unspecified: Secondary | ICD-10-CM

## 2016-10-25 DIAGNOSIS — R1310 Dysphagia, unspecified: Secondary | ICD-10-CM | POA: Diagnosis not present

## 2016-10-25 DIAGNOSIS — I5022 Chronic systolic (congestive) heart failure: Secondary | ICD-10-CM

## 2016-10-25 DIAGNOSIS — R0902 Hypoxemia: Secondary | ICD-10-CM | POA: Diagnosis not present

## 2016-10-25 NOTE — Progress Notes (Signed)
Location:  Medical illustrator of Service:  SNF (31) Provider:   [name], ANP [facility] [phone]   [name], DO  Patient Care Team: [name], DO as PCP - General (Geriatric Medicine) [name], NP as Nurse Practitioner (Nurse Practitioner)  Extended Emergency Contact Information Primary Emergency Contact: [name] Address: [address], [address]          [place], [place] [address] [name] Home Phone: [phone] Relation: Other Secondary Emergency Contact: [name] [place] Home Phone: [phone] Relation: Daughter  Code Status:  DNR Goals of care: Advanced Directive information Advanced Directives 10/16/2016  Does Patient Have a Medical Advance Directive? Yes  Type of Advance Directive Out of facility DNR (pink MOST or yellow form);Living will;Healthcare Power of Attorney  Does patient want to make changes to medical advance directive? -  Copy of Healthcare Power of Attorney in Chart? Yes  Pre-existing out of facility DNR order (yellow form or pink MOST form) Yellow form placed in chart (order not valid for inpatient use)     Chief Complaint  Patient presents with  . Acute Visit    f/u dysphagia, delirium, femur fx    HPI:  Pt is a 83 y.o. female seen today for an acute visit for f/u regarding dysphagia, 02 dependency, delirium, and femur fx.   S/P Left hip ORIF due to distal femoral shaft fx on 09/30/16.  She is now TWB LLE with ROM to left knee and hip, knee immobilizer when up out of bed.  She is using tylenol for pain. Her pain is controlled per the staff.  She has baseline dementia but is more confused since the fall and surgery.  She resides in skilled are and has a personal caregiver who assists with all ADLs.    Speech has been following her for dysphagia. They report that her performance fluctuates. She becomes hypophonic after eating.  Staff  reports she has a dry cough with liquids.  ST has not changed her diet (because she seemed to aspirate with all consistencies) but did recommend no straws.   Since her hospitalizations she has been dependent on oxygen 2L.  Staff report she drops to the 80's without it. BNP 7525 7/23 CXR 7/23: large hiatal hernia, mild chronic lung changes, no acute infiltrate  She has chronic systolic CHF with EF <35%.   Weights have been in the 174-177 range, denies sob, no edema.  Currently on lasix alternating 40 mg with 20 mg   Past Medical History:  Diagnosis Date  . CAD (coronary artery disease)   . Chronic low back pain   . Contusion of foot, right 05/15/12  . Diverticulitis   . Dyslipidemia   . H/O: hysterectomy   . HTN (hypertension)   . Memory disturbance 05/2012   MMSE 26/30, intact Clock test  . Nonischemic cardiomyopathy (HCC)   . Osteoporosis   . Pulmonary embolism (HCC)   . Right knee pain 05/15/12  . Spinal stenosis   . Ulcer disease   . Weight loss    Past Surgical History:  Procedure Laterality Date  . APPENDECTOMY  2010  . BACK SURGERY    . BIV ICD GENERTAOR CHANGE OUT N/A 05/24/2011   Procedure: BIV ICD GENERTAOR CHANGE OUT;  Surgeon: [name], MD;  Location: [facility] CATH LAB;  Service: Cardiovascular;  Laterality: N/A;  . CATARACT EXTRACTION    . COLON RESECTION  2005  .  CORONARY ANGIOPLASTY WITH STENT PLACEMENT  01/2003  . HEMICOLECTOMY    . HIP ARTHROPLASTY Left 04/05/2014   Procedure: ARTHROPLASTY BIPOLAR HIP;  Surgeon: Shelda Pal, MD;  Location: [facility] ORS;  Service: Orthopedics;  Laterality: Left;  . left shoulder replacement  1991   Dr. [name]  . ORIF HIP FRACTURE Left 09/30/2016   Procedure: OPEN REDUCTION INTERNAL FIXATION PERIPROSTHETIC HIP FRACTURE;  Surgeon: Ollen Gross, MD;  Location: [facility] ORS;  Service: Orthopedics;  Laterality: Left;  . right shoulder repair  1985   Dr. [name]  . rotator cuff debridement  1999   Dr. [name]    Allergies  Allergen Reactions    . Arthrotec [Diclofenac-Misoprostol]   . Ibuprofen   . Lactose Intolerance (Gi)   . Milk-Related Compounds Nausea And Vomiting    Cheese  . Other     Grass and ragweed   . Oxycontin [Oxycodone Hcl]   . Pollen Extract     Outpatient Encounter Prescriptions as of 10/25/2016  Medication Sig  . acetaminophen (TYLENOL) 500 MG tablet Take 500 mg by mouth 3 (three) times daily.  Marland Kitchen albuterol (PROVENTIL HFA;VENTOLIN HFA) 108 (90 Base) MCG/ACT inhaler Inhale 2 puffs into the lungs every 6 (six) hours as needed for wheezing or shortness of breath.  . allopurinol (ZYLOPRIM) 100 MG tablet Take 100 mg by mouth daily.  . Carboxymethylcellul-Glycerin (OPTIVE) 0.5-0.9 % SOLN Apply 2 drops to eye 2 (two) times daily.  . carvedilol (COREG) 3.125 MG tablet Take 1.5625 mg by mouth 2 (two) times daily with a meal.   . cyanocobalamin 1000 MCG tablet Take 1,000 mcg by mouth daily.   . Diaper Rash Products (DESITIN) OINT Place 1 application rectally as directed. Apply after each bathroom use  . docusate (COLACE) 50 MG/5ML liquid Take 100 mg by mouth daily.  . furosemide (LASIX) 20 MG tablet Take 20 mg by mouth every other day. Alternate with 40mg  lasix  . furosemide (LASIX) 40 MG tablet Take 40 mg by mouth every other day. Alternate with 20mg  lasix  . ipratropium-albuterol (DUONEB) 0.5-2.5 (3) MG/3ML SOLN Take 3 mLs by nebulization every 6 (six) hours as needed.  . lactose free nutrition (BOOST) LIQD Take 237 mLs by mouth 2 (two) times daily between meals.  . nitroGLYCERIN (NITROSTAT) 0.4 MG SL tablet Place 0.4 mg under the tongue every 5 (five) minutes as needed for chest pain.  . polyethylene glycol (MIRALAX / GLYCOLAX) packet Take 17 g by mouth daily. Hold for loose stools  . ranitidine (ZANTAC) 150 MG tablet Take 150 mg by mouth at bedtime.  . sertraline (ZOLOFT) 25 MG tablet Take 25 mg by mouth daily.  . [DISCONTINUED] ciprofloxacin (CIPRO) 250 MG tablet Take 250 mg by mouth 2 (two) times daily.  .  [DISCONTINUED] enoxaparin (LOVENOX) 30 MG/0.3ML injection Inject 0.3 mLs (30 mg total) into the skin daily.   No facility-administered encounter medications on file as of 10/25/2016.     Review of Systems  Constitutional: Negative for activity change, appetite change, chills, diaphoresis, fatigue, fever and unexpected weight change.  HENT: Positive for trouble swallowing. Negative for congestion.   Respiratory: Positive for cough (with meals). Negative for shortness of breath and wheezing.   Cardiovascular: Negative for chest pain, palpitations and leg swelling.  Gastrointestinal: Negative for abdominal distention, abdominal pain, constipation and diarrhea.  Genitourinary: Negative for difficulty urinating and dysuria.  Musculoskeletal: Positive for arthralgias and gait problem. Negative for back pain, joint swelling and myalgias.  Neurological: Negative for dizziness,  tremors, seizures, syncope, facial asymmetry, speech difficulty, weakness, light-headedness, numbness and headaches.  Psychiatric/Behavioral: Positive for confusion. Negative for agitation and behavioral problems.    Immunization History  Administered Date(s) Administered  . Influenza Inj Mdck Quad Pf 01/05/2016   Pertinent  Health Maintenance Due  Topic Date Due  . DEXA SCAN  05/18/1987  . PNA vac Low Risk Adult (1 of 2 - PCV13) 05/18/1987   No flowsheet data found. Functional Status Survey:    Vitals:   10/25/16 1450  BP: 112/75  Pulse: 64  Temp: 97.8 F (36.6 C)  SpO2: 97%  Weight: 177 lb (80.3 kg)   Body mass index is 32.37 kg/m. Physical Exam  Constitutional: No distress.  HENT:  Head: Normocephalic and atraumatic.  Nose: Nose normal.  Mouth/Throat: Oropharynx is clear and moist. No oropharyngeal exudate.  Neck: No JVD present.  Cardiovascular: Normal rate and regular rhythm.   Murmur heard. Pulmonary/Chest: Effort normal. No respiratory distress. She has no wheezes.  Decreased bases  Abdominal:  Soft. Bowel sounds are normal. She exhibits no distension.  Musculoskeletal: She exhibits no edema or tenderness.  Lymphadenopathy:    She has no cervical adenopathy.  Neurological: She is alert.  Skin: Skin is warm and dry. She is not diaphoretic.  Left hip incision with steritrips in place, no drainage, erythema or swelling  Psychiatric: She has a normal mood and affect.  Nursing note and vitals reviewed.   Labs reviewed:  Recent Labs  10/02/16 0739 10/03/16 0536 10/04/16 0522 10/09/16  NA 139 141 141 141  K 3.6 4.1 3.9 4.0  CL 103 103 104  --   CO2 28 27 29   --   GLUCOSE 113* 118* 109*  --   BUN 45* 46* 44* 29*  CREATININE 1.61* 1.34* 1.09* 1.1  CALCIUM 8.0* 8.1* 8.3*  --     Recent Labs  09/30/16 0538 10/09/16  AST 28 18  ALT 8* 6*  ALKPHOS 57 71  BILITOT 1.4*  --   PROT 6.5  --   ALBUMIN 3.6  --     Recent Labs  09/29/16 1521  10/01/16 0516 10/02/16 0739 10/03/16 0536 10/04/16 0522 10/09/16  WBC 11.8*  < > 14.2* 9.9 10.4 9.6 9.5  NEUTROABS 8.9*  --  11.1*  --   --   --   --   HGB 12.5  < > 9.0* 8.2* 8.3* 8.1* 9.4*  HCT 36.5  < > 27.8* 25.6* 22.8* 25.5* 28*  MCV 101.7*  < > 95.9 97.3 102.7* 100.0  --   PLT 203  < > 157 144* 175 202 383  < > = values in this interval not displayed. Lab Results  Component Value Date   TSH 4.58 10/10/2016   No results found for: HGBA1C Lab Results  Component Value Date   CHOL  12/12/2009    161        ATP III CLASSIFICATION:  <200     mg/dL   Desirable  756-433  mg/dL   Borderline High  >=295    mg/dL   High          HDL 61 12/12/2009   LDLCALC  12/12/2009    83        Total Cholesterol/HDL:CHD Risk Coronary Heart Disease Risk Table                     Men   Women  1/2 Average Risk   3.4   3.3  Average Risk       5.0   4.4  2 X Average Risk   9.6   7.1  3 X Average Risk  23.4   11.0        Use the calculated Patient Ratio above and the CHD Risk Table to determine the patient's CHD Risk.        ATP III  CLASSIFICATION (LDL):  <100     mg/dL   Optimal  960-454  mg/dL   Near or Above                    Optimal  130-159  mg/dL   Borderline  098-119  mg/dL   High  >147     mg/dL   Very High   TRIG 86 82/95/6213   CHOLHDL 2.6 12/12/2009    Significant Diagnostic Results in last 30 days:  Dg Pelvis 1-2 Views  Result Date: 09/29/2016 CLINICAL DATA:  Left thigh pain following a fall today. EXAM: PELVIS - 1-2 VIEW COMPARISON:  04/05/2014. FINDINGS: Stable left hip bipolar prosthesis. Old, healed bilateral pubic fractures. No acute fracture or dislocation. 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 Lower lumbar spine degenerative changes. IMPRESSION: 1. No acute fracture or dislocation. 2. 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 Electronically Signed   By: Beckie Salts M.D.   On: 09/29/2016 15:00   Dg Tibia/fibula Left  Result Date: 09/29/2016 CLINICAL DATA:  Left leg pain following a fall today. EXAM: LEFT TIBIA AND FIBULA - 2 VIEW COMPARISON:  None. FINDINGS: Left total knee prosthesis. No fracture or dislocation seen. The images at the level of the ankle are suboptimally positioned due to a femur fracture. Tarsal degenerative changes are noted in the foot. IMPRESSION: No lower leg fracture or dislocation seen. Electronically Signed   By: Beckie Salts M.D.   On: 09/29/2016 15:04   Ct Head Wo Contrast  Result Date: 09/29/2016 CLINICAL DATA:  [facility], denies loss of consciousness, history hypertension, non ischemic cardiomyopathy, coronary artery disease EXAM: CT HEAD WITHOUT CONTRAST CT CERVICAL SPINE WITHOUT CONTRAST TECHNIQUE: Multidetector CT imaging of the head and cervical spine was performed following the standard protocol without intravenous contrast. Multiplanar CT image reconstructions of the cervical spine were also generated. COMPARISON:  04/04/2014 FINDINGS: CT HEAD FINDINGS Brain: Generalized atrophy. Normal ventricular morphology. No midline  shift or mass effect. Small vessel chronic ischemic changes of deep cerebral white matter. No intracranial hemorrhage, mass lesion, evidence of acute infarction, or extra-axial fluid collection. Vascular: Atherosclerotic calcifications of internal carotid arteries at skullbase Skull: Osseous demineralization.  Calvaria intact. Sinuses/Orbits: Chronic opacification of portions of the RIGHT mastoid air cells. Remaining sinuses and LEFT mastoid air cells clear Other: N/A CT CERVICAL SPINE FINDINGS Alignment: Normal Skull base and vertebrae: Osseous demineralization. Visualized skullbase intact. Vertebral body heights maintained without fracture or bone destruction. Multilevel facet degenerative changes bilaterally. Ankylosis of C2-C3 facet joints. Soft tissues and spinal canal: Prevertebral soft tissues normal thickness. Atherosclerotic calcifications at the carotid bifurcations. Disc levels: Mild scattered disc space narrowing. Spinal canal patent. Beam hardening artifacts from patient shoulders. Upper chest: Visualized lung apices clear. Other: N/A IMPRESSION: Atrophy with small vessel chronic ischemic changes of deep cerebral white matter. No acute intracranial abnormalities. Degenerative disc and facet disease changes cervical spine. No acute cervical spine abnormalities. Electronically Signed   By: Ulyses Southward M.D.   On: 09/29/2016 14:34   Ct Cervical Spine Wo Contrast  Result Date:  09/29/2016 CLINICAL DATA:  Larey Seat today at [facility], denies loss of consciousness, history hypertension, non ischemic cardiomyopathy, coronary artery disease EXAM: CT HEAD WITHOUT CONTRAST CT CERVICAL SPINE WITHOUT CONTRAST TECHNIQUE: Multidetector CT imaging of the head and cervical spine was performed following the standard protocol without intravenous contrast. Multiplanar CT image reconstructions of the cervical spine were also generated. COMPARISON:  04/04/2014 FINDINGS: CT HEAD FINDINGS Brain: Generalized atrophy.  Normal ventricular morphology. No midline shift or mass effect. Small vessel chronic ischemic changes of deep cerebral white matter. No intracranial hemorrhage, mass lesion, evidence of acute infarction, or extra-axial fluid collection. Vascular: Atherosclerotic calcifications of internal carotid arteries at skullbase Skull: Osseous demineralization.  Calvaria intact. Sinuses/Orbits: Chronic opacification of portions of the RIGHT mastoid air cells. Remaining sinuses and LEFT mastoid air cells clear Other: N/A CT CERVICAL SPINE FINDINGS Alignment: Normal Skull base and vertebrae: Osseous demineralization. Visualized skullbase intact. Vertebral body heights maintained without fracture or bone destruction. Multilevel facet degenerative changes bilaterally. Ankylosis of C2-C3 facet joints. Soft tissues and spinal canal: Prevertebral soft tissues normal thickness. Atherosclerotic calcifications at the carotid bifurcations. Disc levels: Mild scattered disc space narrowing. Spinal canal patent. Beam hardening artifacts from patient shoulders. Upper chest: Visualized lung apices clear. Other: N/A IMPRESSION: Atrophy with small vessel chronic ischemic changes of deep cerebral white matter. No acute intracranial abnormalities. Degenerative disc and facet disease changes cervical spine. No acute cervical spine abnormalities. Electronically Signed   By: Ulyses Southward M.D.   On: 09/29/2016 14:34   Dg Femur Min 2 Views Left  Result Date: 09/30/2016 CLINICAL DATA:  Internal fixation EXAM: LEFT FEMUR 2 VIEWS COMPARISON:  09/29/2016 FINDINGS: Changes of prior left hip and left knee replacement. Plate, screw and cerclage fixation noted across the distal femoral shaft fracture. Near anatomic alignment. IMPRESSION: Internal fixation of the distal femoral fracture with near anatomic alignment. Electronically Signed   By: Charlett Nose M.D.   On: 09/30/2016 09:47   Dg Femur Min 2 Views Left  Result Date: 09/29/2016 CLINICAL DATA:   Left thigh pain following a fall today. EXAM: LEFT FEMUR 2 VIEWS COMPARISON:  None. FINDINGS: Oblique fracture of the distal femoral shaft with almost 1 shaft width of medial displacement and medial angulation of the distal fragment. There is also posterior angulation of the distal fragment. The fracture is mildly comminuted with 4 cm of overlapping of the fragments. Left knee and left hip prostheses are noted as well as atheromatous arterial calcifications. IMPRESSION: Distal femoral shaft fracture, as described above. Electronically Signed   By: Beckie Salts M.D.   On: 09/29/2016 15:03    Assessment/Plan  1. Delirium No improvement thus far per staff. Due to recent fall with hospitalization/surgery. Continue supportive environment and monitor for improvement with increased mobility. Would not benefit from medication for memory. Seems to be eating and drinking well and not losing weight  2. Dysphagia, unspecified type Continue regular diet with asp prec, no straws, and assistance with meals Remains on zantac (has large hiatal hernia) ST continues   3. Chronic systolic heart failure (HCC) Appears compensated on exam.   Continue current dose of lasix and weekly weights  4. Hypoxia Most likely due to atelectasis with immobility, should continue IS QID and encourage to get oob Aspiration may also contribute but she has not active s/s of infection Continue to monitor and hopefully we will be able to wean 02 as she becomes more mobile   Family/ staff Communication:   Labs/tests ordered:  NA

## 2016-11-05 ENCOUNTER — Encounter: Payer: Self-pay | Admitting: Adult Health

## 2016-11-05 ENCOUNTER — Non-Acute Institutional Stay (SKILLED_NURSING_FACILITY): Payer: Medicare Other | Admitting: Adult Health

## 2016-11-05 DIAGNOSIS — R350 Frequency of micturition: Secondary | ICD-10-CM

## 2016-11-05 DIAGNOSIS — R05 Cough: Secondary | ICD-10-CM | POA: Diagnosis not present

## 2016-11-05 DIAGNOSIS — R319 Hematuria, unspecified: Secondary | ICD-10-CM | POA: Diagnosis not present

## 2016-11-05 DIAGNOSIS — R300 Dysuria: Secondary | ICD-10-CM | POA: Diagnosis not present

## 2016-11-05 DIAGNOSIS — R059 Cough, unspecified: Secondary | ICD-10-CM

## 2016-11-05 NOTE — Progress Notes (Addendum)
Location:  Medical illustrator of Service:  SNF (31) Provider:   [name], ANP [facility] [phone]   [name], DO  Patient Care Team: [name], DO as PCP - General (Geriatric Medicine) [name], NP as Nurse Practitioner (Nurse Practitioner)  Extended Emergency Contact Information Primary Emergency Contact: [name] Address: [address]          [place], [place] [address] [name] Home Phone: [phone] Relation: Other Secondary Emergency Contact: [name] [place] Home Phone: [phone] Relation: Daughter  Code Status:  DNR Goals of care: Advanced Directive information Advanced Directives 10/16/2016  Does Patient Have a Medical Advance Directive? Yes  Type of Advance Directive Out of facility DNR (pink MOST or yellow form);Living will;Healthcare Power of Attorney  Does patient want to make changes to medical advance directive? -  Copy of Healthcare Power of Attorney in Chart? Yes  Pre-existing out of facility DNR order (yellow form or pink MOST form) Yellow form placed in chart (order not valid for inpatient use)     Chief Complaint  Patient presents with  . Acute Visit    frequency, pain    HPI:  Pt is a 83 y.o. female seen today for an acute visit for urinary frequency and pain. The symptoms were first noticed 3 days ago. She got up to urinate 5-6x a shift and only urinated a few drops. She has baseline dementia but staff report that has been slightly more confused than usual. The nurses SBAR indicates the family feels she is more agitated as well. She has been afebrile.  She has had dysphagia since returning from the hospital after the hip fx/surgery in July. ST recommended a regular diet as she tended to aspirated on various consistencies. Staff report she had a dry cough for 1 day.  Upon my visit she appeared to be in pain and confirmed it was in  the bladder area.  Bladder scanner revealed 99 cc in bladder.  Treated for "UTI" one month ago with Cipro but only 25,000 colonies noted. She had a BM 11/05/2016 Has been dependent upon oxygen since returning from the hospital. Refuses to do incentive spirometer.  Past Medical History:  Diagnosis Date  . CAD (coronary artery disease)   . Chronic low back pain   . Contusion of foot, right 05/15/12  . Diverticulitis   . Dyslipidemia   . H/O: hysterectomy   . HTN (hypertension)   . Memory disturbance 05/2012   MMSE 26/30, intact Clock test  . Nonischemic cardiomyopathy (HCC)   . Osteoporosis   . Pulmonary embolism (HCC)   . Right knee pain 05/15/12  . Spinal stenosis   . Ulcer disease   . Weight loss    Past Surgical History:  Procedure Laterality Date  . APPENDECTOMY  2010  . BACK SURGERY    . BIV ICD GENERTAOR CHANGE OUT N/A 05/24/2011   Procedure: BIV ICD GENERTAOR CHANGE OUT;  Surgeon: [name], MD;  Location: [facility] CATH LAB;  Service: Cardiovascular;  Laterality: N/A;  . CATARACT EXTRACTION    . COLON RESECTION  2005  . CORONARY ANGIOPLASTY WITH STENT PLACEMENT  01/2003  . HEMICOLECTOMY    . HIP ARTHROPLASTY Left 04/05/2014   Procedure: ARTHROPLASTY BIPOLAR HIP;  Surgeon: Shelda Pal, MD;  Location: [facility] ORS;  Service: Orthopedics;  Laterality: Left;  . left shoulder replacement  1991   Dr. [name]  .  ORIF HIP FRACTURE Left 09/30/2016   Procedure: OPEN REDUCTION INTERNAL FIXATION PERIPROSTHETIC HIP FRACTURE;  Surgeon: Ollen Gross, MD;  Location: [facility] ORS;  Service: Orthopedics;  Laterality: Left;  . right shoulder repair  1985   Dr. [name]  . rotator cuff debridement  1999   Dr. [name]    Allergies  Allergen Reactions  . Arthrotec [Diclofenac-Misoprostol]   . Ibuprofen   . Lactose Intolerance (Gi)   . Milk-Related Compounds Nausea And Vomiting    Cheese  . Other     Grass and ragweed   . Oxycontin [Oxycodone Hcl]   . Pollen Extract     Outpatient Encounter  Prescriptions as of 11/05/2016  Medication Sig  . acetaminophen (TYLENOL) 500 MG tablet Take 500 mg by mouth 3 (three) times daily.  Marland Kitchen albuterol (PROVENTIL HFA;VENTOLIN HFA) 108 (90 Base) MCG/ACT inhaler Inhale 2 puffs into the lungs every 6 (six) hours as needed for wheezing or shortness of breath.  . allopurinol (ZYLOPRIM) 100 MG tablet Take 100 mg by mouth daily.  . Carboxymethylcellul-Glycerin (OPTIVE) 0.5-0.9 % SOLN Apply 2 drops to eye 2 (two) times daily.  . carvedilol (COREG) 3.125 MG tablet Take 1.5625 mg by mouth 2 (two) times daily with a meal.   . cyanocobalamin 1000 MCG tablet Take 1,000 mcg by mouth daily.   . Diaper Rash Products (DESITIN) OINT Place 1 application rectally as directed. Apply after each bathroom use  . docusate (COLACE) 50 MG/5ML liquid Take 100 mg by mouth daily.  . furosemide (LASIX) 20 MG tablet Take 20 mg by mouth every other day. Alternate with 40mg  lasix  . furosemide (LASIX) 40 MG tablet Take 40 mg by mouth every other day. Alternate with 20mg  lasix  . lactose free nutrition (BOOST) LIQD Take 237 mLs by mouth 2 (two) times daily between meals.  . polyethylene glycol (MIRALAX / GLYCOLAX) packet Take 17 g by mouth daily. Hold for loose stools  . ranitidine (ZANTAC) 150 MG tablet Take 150 mg by mouth at bedtime.  Marland Kitchen ipratropium-albuterol (DUONEB) 0.5-2.5 (3) MG/3ML SOLN Take 3 mLs by nebulization every 6 (six) hours as needed.  . nitroGLYCERIN (NITROSTAT) 0.4 MG SL tablet Place 0.4 mg under the tongue every 5 (five) minutes as needed for chest pain.  Marland Kitchen sertraline (ZOLOFT) 25 MG tablet Take 25 mg by mouth daily.   No facility-administered encounter medications on file as of 11/05/2016.     Review of Systems  Constitutional: Negative for activity change, appetite change, chills, diaphoresis, fatigue, fever and unexpected weight change.  HENT: Positive for trouble swallowing. Negative for congestion, postnasal drip, rhinorrhea and sore throat.   Respiratory:  Positive for cough. Negative for shortness of breath and wheezing.   Cardiovascular: Negative for chest pain and leg swelling.  Gastrointestinal: Negative for abdominal distention, constipation and diarrhea.  Genitourinary: Positive for difficulty urinating, frequency and pelvic pain. Negative for decreased urine volume, dysuria, flank pain, genital sores, hematuria and vaginal bleeding.  Musculoskeletal: Positive for arthralgias and gait problem.  Psychiatric/Behavioral: Positive for agitation and confusion. Negative for behavioral problems.    Immunization History  Administered Date(s) Administered  . Influenza Inj Mdck Quad Pf 01/05/2016   Pertinent  Health Maintenance Due  Topic Date Due  . DEXA SCAN  05/18/1987  . PNA vac Low Risk Adult (1 of 2 - PCV13) 05/18/1987   No flowsheet data found. Functional Status Survey:    Vitals:   11/05/16 1406  BP: 129/78  Pulse: 86  Resp: (!) 21  Temp: (!) 97.4 F (36.3 C)  SpO2: 100%   There is no height or weight on file to calculate BMI. Physical Exam  Constitutional: No distress.  HENT:  Head: Normocephalic and atraumatic.  Mouth/Throat: Oropharynx is clear and moist. No oropharyngeal exudate.  Eyes: Pupils are equal, round, and reactive to light. Conjunctivae are normal. Right eye exhibits no discharge. Left eye exhibits no discharge.  Neck: No JVD present.  Cardiovascular: Normal rate and regular rhythm.   No murmur heard. Pulmonary/Chest: Effort normal. No respiratory distress. She has wheezes (slight bilat exp).  Abdominal: Soft. Bowel sounds are normal. She exhibits no distension. There is tenderness (s/p area, no CVA tenderness).  Musculoskeletal: She exhibits no edema or tenderness.  Slight bruising to left hip area, incision clear dry and intact  Neurological: She is alert.  Oriented to self and place, not situation or time  Skin: Skin is warm and dry. She is not diaphoretic.  Psychiatric:  Appears slighlty  uncomfortable    Labs reviewed:  Recent Labs  10/02/16 0739 10/03/16 0536 10/04/16 0522 10/09/16  NA 139 141 141 141  K 3.6 4.1 3.9 4.0  CL 103 103 104  --   CO2 28 27 29   --   GLUCOSE 113* 118* 109*  --   BUN 45* 46* 44* 29*  CREATININE 1.61* 1.34* 1.09* 1.1  CALCIUM 8.0* 8.1* 8.3*  --     Recent Labs  09/30/16 0538 10/09/16  AST 28 18  ALT 8* 6*  ALKPHOS 57 71  BILITOT 1.4*  --   PROT 6.5  --   ALBUMIN 3.6  --     Recent Labs  09/29/16 1521  10/01/16 0516 10/02/16 0739 10/03/16 0536 10/04/16 0522 10/09/16  WBC 11.8*  < > 14.2* 9.9 10.4 9.6 9.5  NEUTROABS 8.9*  --  11.1*  --   --   --   --   HGB 12.5  < > 9.0* 8.2* 8.3* 8.1* 9.4*  HCT 36.5  < > 27.8* 25.6* 22.8* 25.5* 28*  MCV 101.7*  < > 95.9 97.3 102.7* 100.0  --   PLT 203  < > 157 144* 175 202 383  < > = values in this interval not displayed. Lab Results  Component Value Date   TSH 4.58 10/10/2016   No results found for: HGBA1C Lab Results  Component Value Date   CHOL  12/12/2009    161        ATP III CLASSIFICATION:  <200     mg/dL   Desirable  161-096  mg/dL   Borderline High  >=045    mg/dL   High          HDL 61 12/12/2009   LDLCALC  12/12/2009    83        Total Cholesterol/HDL:CHD Risk Coronary Heart Disease Risk Table                     Men   Women  1/2 Average Risk   3.4   3.3  Average Risk       5.0   4.4  2 X Average Risk   9.6   7.1  3 X Average Risk  23.4   11.0        Use the calculated Patient Ratio above and the CHD Risk Table to determine the patient's CHD Risk.        ATP III CLASSIFICATION (LDL):  <100     mg/dL  Optimal  100-129  mg/dL   Near or Above                    Optimal  130-159  mg/dL   Borderline  119-147  mg/dL   High  >829     mg/dL   Very High   TRIG 86 56/21/3086   CHOLHDL 2.6 12/12/2009    Significant Diagnostic Results in last 30 days:  No results found.  Assessment/Plan  1. Urinary frequency With mild bladder pain (although staff  report that she occasionally has abd pain and will miss meals) No retention noted Check UA C and S If UA shows s/s of infection will initiate antimicrobial therapy and adjust accordingly to sensitivity report  2. Cough At risk for aspiration given her hx of dysphagia  Will not treat at this point since the cough is mild and only present for one day. Order written to monitor for resp symptoms    Family/ staff Communication: discussed with staff  Labs/tests ordered:  UA C and S

## 2016-11-12 ENCOUNTER — Encounter: Payer: Self-pay | Admitting: Adult Health

## 2016-11-12 ENCOUNTER — Non-Acute Institutional Stay (SKILLED_NURSING_FACILITY): Payer: Medicare Other | Admitting: Adult Health

## 2016-11-12 DIAGNOSIS — R05 Cough: Secondary | ICD-10-CM | POA: Diagnosis not present

## 2016-11-12 DIAGNOSIS — H6123 Impacted cerumen, bilateral: Secondary | ICD-10-CM | POA: Diagnosis not present

## 2016-11-12 DIAGNOSIS — R059 Cough, unspecified: Secondary | ICD-10-CM

## 2016-11-12 DIAGNOSIS — R1310 Dysphagia, unspecified: Secondary | ICD-10-CM | POA: Diagnosis not present

## 2016-11-12 NOTE — Progress Notes (Signed)
Location:  Medical illustrator of Service:  SNF (31) Provider:   [name], ANP [facility] [phone]   [name], DO  Patient Care Team: [name], DO as PCP - General (Geriatric Medicine) [name], NP as Nurse Practitioner (Nurse Practitioner)  Extended Emergency Contact Information Primary Emergency Contact: [name] Address: [address], [address]          [place], [place] [address] [name] Home Phone: [phone] Relation: Other Secondary Emergency Contact: [name] [place] Home Phone: [phone] Relation: Daughter  Code Status:  DNR Goals of care: Advanced Directive information Advanced Directives 11/12/2016  Does Patient Have a Medical Advance Directive? Yes  Type of Advance Directive Out of facility DNR (pink MOST or yellow form);Living will;Healthcare Power of Attorney  Does patient want to make changes to medical advance directive? -  Copy of Healthcare Power of Attorney in Chart? Yes  Pre-existing out of facility DNR order (yellow form or pink MOST form) Yellow form placed in chart (order not valid for inpatient use);Pink MOST form placed in chart (order not valid for inpatient use)     Chief Complaint  Patient presents with  . Acute Visit    cough, wheeze    HPI:  [name] is a 83 y.o. female seen today for an acute visit for cough and wheeze.  These symptoms have been present for one week. She has been oxygen dependent on 2L since arriving from the hospital after a fall with subsequent hip fracture and surgery in July.  She is not able to perform well with the IS due to dementia. Staff report coughing and wheezing but no fever or increased work of breathing. She has been evaluated by speech therapy and it is noted that she has dysphagia and most likely aspirates on all consistencies and textures.    Past Medical History:  Diagnosis Date  . CAD (coronary  artery disease)   . Chronic low back pain   . Contusion of foot, right 05/15/12  . Diverticulitis   . Dyslipidemia   . H/O: hysterectomy   . HTN (hypertension)   . Memory disturbance 05/2012   MMSE 26/30, intact Clock test  . Nonischemic cardiomyopathy (HCC)   . Osteoporosis   . Pulmonary embolism (HCC)   . Right knee pain 05/15/12  . Spinal stenosis   . Ulcer disease   . Weight loss    Past Surgical History:  Procedure Laterality Date  . APPENDECTOMY  2010  . BACK SURGERY    . BIV ICD GENERTAOR CHANGE OUT N/A 05/24/2011   Procedure: BIV ICD GENERTAOR CHANGE OUT;  Surgeon: [name], MD;  Location: [facility] CATH LAB;  Service: Cardiovascular;  Laterality: N/A;  . CATARACT EXTRACTION    . COLON RESECTION  2005  . CORONARY ANGIOPLASTY WITH STENT PLACEMENT  01/2003  . HEMICOLECTOMY    . HIP ARTHROPLASTY Left 04/05/2014   Procedure: ARTHROPLASTY BIPOLAR HIP;  Surgeon: Shelda Pal, MD;  Location: [facility] ORS;  Service: Orthopedics;  Laterality: Left;  . left shoulder replacement  1991   Dr. [name]  . ORIF HIP FRACTURE Left 09/30/2016   Procedure: OPEN REDUCTION INTERNAL FIXATION PERIPROSTHETIC HIP FRACTURE;  Surgeon: Ollen Gross, MD;  Location: [facility] ORS;  Service: Orthopedics;  Laterality: Left;  . right shoulder repair  1985   Dr. [name]  . rotator cuff debridement  1999   Dr. [name]    Allergies  Allergen Reactions  . Arthrotec [Diclofenac-Misoprostol]   . Ibuprofen   . Lactose Intolerance (Gi)   . Milk-Related Compounds Nausea And Vomiting    Cheese  . Other     Grass and ragweed   . Oxycontin [Oxycodone Hcl]   . Pollen Extract     Outpatient Encounter Prescriptions as of 11/12/2016  Medication Sig  . acetaminophen (TYLENOL) 500 MG tablet Take 500 mg by mouth 3 (three) times daily.  Marland Kitchen albuterol (PROVENTIL HFA;VENTOLIN HFA) 108 (90 Base) MCG/ACT inhaler Inhale 2 puffs into the lungs every 6 (six) hours as needed for wheezing or shortness of breath.  . allopurinol (ZYLOPRIM) 100  MG tablet Take 100 mg by mouth daily.  . Carboxymethylcellul-Glycerin (OPTIVE) 0.5-0.9 % SOLN Apply 2 drops to eye 2 (two) times daily.  . carvedilol (COREG) 3.125 MG tablet Take 1.5625 mg by mouth 2 (two) times daily with a meal.   . cyanocobalamin 1000 MCG tablet Take 1,000 mcg by mouth daily.   . Diaper Rash Products (DESITIN) OINT Place 1 application rectally as directed. Apply after each bathroom use  . docusate (COLACE) 50 MG/5ML liquid Take 100 mg by mouth daily.  . furosemide (LASIX) 20 MG tablet Take 20 mg by mouth every other day. Alternate with 40mg  lasix  . furosemide (LASIX) 40 MG tablet Take 40 mg by mouth every other day. Alternate with 20mg  lasix  . ipratropium-albuterol (DUONEB) 0.5-2.5 (3) MG/3ML SOLN Take 3 mLs by nebulization every 6 (six) hours as needed.  . lactose free nutrition (BOOST) LIQD Take 237 mLs by mouth 2 (two) times daily between meals.  . nitroGLYCERIN (NITROSTAT) 0.4 MG SL tablet Place 0.4 mg under the tongue every 5 (five) minutes as needed for chest pain.  . polyethylene glycol (MIRALAX / GLYCOLAX) packet Take 17 g by mouth daily. Hold for loose stools  . ranitidine (ZANTAC) 150 MG tablet Take 150 mg by mouth at bedtime.  . sertraline (ZOLOFT) 25 MG tablet Take 25 mg by mouth daily.   No facility-administered encounter medications on file as of 11/12/2016.     Review of Systems  Constitutional: Negative.   HENT: Positive for congestion and trouble swallowing. Negative for ear discharge, ear pain, mouth sores, sinus pressure, sore throat and voice change.   Respiratory: Positive for cough and wheezing. Negative for choking, chest tightness, shortness of breath and stridor.   Cardiovascular: Negative for chest pain, palpitations and leg swelling.  Gastrointestinal: Negative for abdominal distention, constipation and diarrhea.  Genitourinary: Negative for difficulty urinating and dysuria.  Musculoskeletal: Positive for arthralgias and gait problem.  Skin:  Positive for wound. Negative for color change, pallor and rash.  Psychiatric/Behavioral: Positive for confusion. Negative for agitation and behavioral problems.    Immunization History  Administered Date(s) Administered  . Influenza Inj Mdck Quad Pf 01/05/2016   Pertinent  Health Maintenance Due  Topic Date Due  . DEXA SCAN  05/18/1987  . PNA vac Low Risk Adult (1 of 2 - PCV13) 05/18/1987   No flowsheet data found. Functional Status Survey:    Vitals:   11/12/16 1200  BP: 115/74  Pulse: 76  Resp: 17  Temp: (!) 97.2 F (36.2 C)  SpO2: 98%  Weight: 164 lb 14.4 oz (74.8 kg)   Body mass index is 30.16 kg/m. Physical Exam  Constitutional: No distress.  HENT:  Head: Normocephalic and atraumatic.  Nose: Nose normal.  Mouth/Throat: No oropharyngeal exudate.  Bilateral cerumen impaction  Eyes: Pupils are equal, round, and reactive  to light. Conjunctivae are normal. Right eye exhibits no discharge. Left eye exhibits no discharge.  Cardiovascular: Normal rate and regular rhythm.   No murmur heard. BLE trace edema  Pulmonary/Chest: She has wheezes. She has rales (both bases).  Bilateral upper lobes  Abdominal: Soft. Bowel sounds are normal.  Lymphadenopathy:    She has cervical adenopathy.  Neurological: She is alert.  Oriented to self only, able to follow commands  Skin: She is not diaphoretic.  Psychiatric: She has a normal mood and affect.  Nursing note and vitals reviewed.   Labs reviewed:  Recent Labs  10/02/16 0739 10/03/16 0536 10/04/16 0522 10/09/16  NA 139 141 141 141  K 3.6 4.1 3.9 4.0  CL 103 103 104  --   CO2 28 27 29   --   GLUCOSE 113* 118* 109*  --   BUN 45* 46* 44* 29*  CREATININE 1.61* 1.34* 1.09* 1.1  CALCIUM 8.0* 8.1* 8.3*  --     Recent Labs  09/30/16 0538 10/09/16  AST 28 18  ALT 8* 6*  ALKPHOS 57 71  BILITOT 1.4*  --   PROT 6.5  --   ALBUMIN 3.6  --     Recent Labs  09/29/16 1521  10/01/16 0516 10/02/16 0739 10/03/16 0536  10/04/16 0522 10/09/16  WBC 11.8*  < > 14.2* 9.9 10.4 9.6 9.5  NEUTROABS 8.9*  --  11.1*  --   --   --   --   HGB 12.5  < > 9.0* 8.2* 8.3* 8.1* 9.4*  HCT 36.5  < > 27.8* 25.6* 22.8* 25.5* 28*  MCV 101.7*  < > 95.9 97.3 102.7* 100.0  --   PLT 203  < > 157 144* 175 202 383  < > = values in this interval not displayed. Lab Results  Component Value Date   TSH 4.58 10/10/2016   No results found for: HGBA1C Lab Results  Component Value Date   CHOL  12/12/2009    161        ATP III CLASSIFICATION:  <200     mg/dL   Desirable  960-454  mg/dL   Borderline High  >=098    mg/dL   High          HDL 61 12/12/2009   LDLCALC  12/12/2009    83        Total Cholesterol/HDL:CHD Risk Coronary Heart Disease Risk Table                     Men   Women  1/2 Average Risk   3.4   3.3  Average Risk       5.0   4.4  2 X Average Risk   9.6   7.1  3 X Average Risk  23.4   11.0        Use the calculated Patient Ratio above and the CHD Risk Table to determine the patient's CHD Risk.        ATP III CLASSIFICATION (LDL):  <100     mg/dL   Optimal  119-147  mg/dL   Near or Above                    Optimal  130-159  mg/dL   Borderline  829-562  mg/dL   High  >130     mg/dL   Very High   TRIG 86 86/57/8469   CHOLHDL 2.6 12/12/2009    Significant Diagnostic Results in  last 30 days:  No results found.  Assessment/Plan  1) Cough  2 view CXR Schedule duoneb TID for 5 days Schedule Robafen 10 cc TID for 5 days  2) Dysphagia  ST has worked with her and feel that she aspirates with most consistencies and textures Her goals of care are comfort based with no feeding tubes Her daughter initially checked the "no antibiotics" selection on the most form but apparently she thought this meant only if she was dying. She would like her to have antibiotics for an acute infection at this point but we agreed to continually look at the situation given the fact she will continue to aspirate and decline. She  agrees she would not want heroic measures to save her life.   3) Cerumen impaction  Initiate cerumen impaction protocol   Family/ staff Communication: discussed with staff   Labs/tests ordered:  CXR

## 2016-11-20 DIAGNOSIS — M978XXD Periprosthetic fracture around other internal prosthetic joint, subsequent encounter: Secondary | ICD-10-CM | POA: Diagnosis not present

## 2016-11-21 DIAGNOSIS — Z4789 Encounter for other orthopedic aftercare: Secondary | ICD-10-CM | POA: Diagnosis not present

## 2016-11-21 DIAGNOSIS — R278 Other lack of coordination: Secondary | ICD-10-CM | POA: Diagnosis not present

## 2016-11-21 DIAGNOSIS — R2689 Other abnormalities of gait and mobility: Secondary | ICD-10-CM | POA: Diagnosis not present

## 2016-11-21 DIAGNOSIS — S72001S Fracture of unspecified part of neck of right femur, sequela: Secondary | ICD-10-CM | POA: Diagnosis not present

## 2016-11-22 DIAGNOSIS — M6389 Disorders of muscle in diseases classified elsewhere, multiple sites: Secondary | ICD-10-CM | POA: Diagnosis not present

## 2016-11-22 DIAGNOSIS — R278 Other lack of coordination: Secondary | ICD-10-CM | POA: Diagnosis not present

## 2016-11-22 DIAGNOSIS — Z4789 Encounter for other orthopedic aftercare: Secondary | ICD-10-CM | POA: Diagnosis not present

## 2016-12-03 DIAGNOSIS — Z4789 Encounter for other orthopedic aftercare: Secondary | ICD-10-CM | POA: Diagnosis not present

## 2016-12-03 DIAGNOSIS — S72001S Fracture of unspecified part of neck of right femur, sequela: Secondary | ICD-10-CM | POA: Diagnosis not present

## 2016-12-03 DIAGNOSIS — R278 Other lack of coordination: Secondary | ICD-10-CM | POA: Diagnosis not present

## 2016-12-03 DIAGNOSIS — R2689 Other abnormalities of gait and mobility: Secondary | ICD-10-CM | POA: Diagnosis not present

## 2016-12-10 ENCOUNTER — Non-Acute Institutional Stay (SKILLED_NURSING_FACILITY): Payer: Medicare Other | Admitting: Adult Health

## 2016-12-10 DIAGNOSIS — R0902 Hypoxemia: Secondary | ICD-10-CM

## 2016-12-10 DIAGNOSIS — Z7409 Other reduced mobility: Secondary | ICD-10-CM | POA: Diagnosis not present

## 2016-12-10 DIAGNOSIS — S72002G Fracture of unspecified part of neck of left femur, subsequent encounter for closed fracture with delayed healing: Secondary | ICD-10-CM | POA: Diagnosis not present

## 2016-12-10 DIAGNOSIS — R1310 Dysphagia, unspecified: Secondary | ICD-10-CM

## 2016-12-10 DIAGNOSIS — F0390 Unspecified dementia without behavioral disturbance: Secondary | ICD-10-CM | POA: Diagnosis not present

## 2016-12-10 DIAGNOSIS — N183 Chronic kidney disease, stage 3 unspecified: Secondary | ICD-10-CM

## 2016-12-10 NOTE — Progress Notes (Signed)
Location:  Medical illustrator of Service:  SNF (31) Provider:   [name], ANP [facility] [phone]   [name], DO  Patient Care Team: [name], DO as PCP - General (Geriatric Medicine) [name], NP as Nurse Practitioner (Nurse Practitioner)  Extended Emergency Contact Information Primary Emergency Contact: [name] Address: [address]          [place], [place] [address] [name] Home Phone: [phone] Relation: Other Secondary Emergency Contact: [name] [place] Home Phone: [phone] Relation: Daughter  Code Status:  DNR Goals of care: Advanced Directive information Advanced Directives 12/11/2016  Does Patient Have a Medical Advance Directive? Yes  Type of Advance Directive Out of facility DNR (pink MOST or yellow form);Living will;Healthcare Power of Attorney  Does patient want to make changes to medical advance directive? -  Copy of Healthcare Power of Attorney in Chart? Yes  Pre-existing out of facility DNR order (yellow form or pink MOST form) Yellow form placed in chart (order not valid for inpatient use);Pink MOST form placed in chart (order not valid for inpatient use)     Chief Complaint  Patient presents with  . Medical Management of Chronic Issues    HPI:  Pt is a 83 y.o. female seen today for medical management of chronic diseases.  She resides in skilled care at [facility]. There are no complaints regarding her care.  S/P Left hip ORIF due to distal femoral shaft fx on 09/30/16.  She remains non weight bearing.  Her incision has healed.  There have been no issues with pain. Staff report that her appetite is satisfactory. No issues with bowel or bladder. She was seen by ortho on 9/4 and they felt that her bone was not well healed and so she will need to f/u in 5 weeks.  She remains on oxygen at 2L.  She has an incentive spirometer but  due to her dementia she is not able to use it well.  Dysphagia: she continue with a chronic cough with meals but no reported choking. CXRs have been negative thus far. She is on a regular diet with no straws.  ST has not recommended changes as she seemed to have issues with all food consistencies.   CKD III Lab Results  Component Value Date   BUN 29 (A) 10/09/2016   Lab Results  Component Value Date   CREATININE 1.1 10/09/2016     Past Medical History:  Diagnosis Date  . CAD (coronary artery disease)   . Chronic low back pain   . Contusion of foot, right 05/15/12  . Diverticulitis   . Dyslipidemia   . H/O: hysterectomy   . HTN (hypertension)   . Memory disturbance 05/2012   MMSE 26/30, intact Clock test  . Nonischemic cardiomyopathy (HCC)   . Osteoporosis   . Pulmonary embolism (HCC)   . Right knee pain 05/15/12  . Spinal stenosis   . Ulcer disease   . Weight loss    Past Surgical History:  Procedure Laterality Date  . APPENDECTOMY  2010  . BACK SURGERY    . BIV ICD GENERTAOR CHANGE OUT N/A 05/24/2011   Procedure: BIV ICD GENERTAOR CHANGE OUT;  Surgeon: [name], MD;  Location: [facility] CATH LAB;  Service: Cardiovascular;  Laterality: N/A;  . CATARACT EXTRACTION    . COLON RESECTION  2005  . CORONARY ANGIOPLASTY WITH STENT PLACEMENT  01/2003  .  HEMICOLECTOMY    . HIP ARTHROPLASTY Left 04/05/2014   Procedure: ARTHROPLASTY BIPOLAR HIP;  Surgeon: Shelda Pal, MD;  Location: [facility] ORS;  Service: Orthopedics;  Laterality: Left;  . left shoulder replacement  1991   Dr. [name]  . ORIF HIP FRACTURE Left 09/30/2016   Procedure: OPEN REDUCTION INTERNAL FIXATION PERIPROSTHETIC HIP FRACTURE;  Surgeon: Ollen Gross, MD;  Location: [facility] ORS;  Service: Orthopedics;  Laterality: Left;  . right shoulder repair  1985   Dr. [name]  . rotator cuff debridement  1999   Dr. [name]    Allergies  Allergen Reactions  . Arthrotec [Diclofenac-Misoprostol]   . Ibuprofen   . Lactose Intolerance  (Gi)   . Milk-Related Compounds Nausea And Vomiting    Cheese  . Other     Grass and ragweed   . Oxycontin [Oxycodone Hcl]   . Pollen Extract     Outpatient Encounter Prescriptions as of 12/10/2016  Medication Sig  . allopurinol (ZYLOPRIM) 100 MG tablet Take 100 mg by mouth daily.  . Carboxymethylcellul-Glycerin (OPTIVE) 0.5-0.9 % SOLN Apply 2 drops to eye 2 (two) times daily.  . carvedilol (COREG) 3.125 MG tablet Take 1.5625 mg by mouth 2 (two) times daily with a meal.   . cyanocobalamin 1000 MCG tablet Take 1,000 mcg by mouth daily.   Marland Kitchen docusate (COLACE) 50 MG/5ML liquid Take 100 mg by mouth daily.  . furosemide (LASIX) 20 MG tablet Take 20 mg by mouth every other day. Alternate with  lasix  . furosemide (LASIX) 40 MG tablet Take 40 mg by mouth every other day. Alternate with  lasix  . ipratropium-albuterol (DUONEB) 0.5-2.5 (3) MG/3ML SOLN Take 3 mLs by nebulization every 6 (six) hours as needed.  . lactose free nutrition (BOOST) LIQD Take 237 mLs by mouth daily.   . nitroGLYCERIN (NITROSTAT) 0.4 MG SL tablet Place 0.4 mg under the tongue every 5 (five) minutes as needed for chest pain.  . polyethylene glycol (MIRALAX / GLYCOLAX) packet Take 17 g by mouth daily. Hold for loose stools  . ranitidine (ZANTAC) 150 MG tablet Take 150 mg by mouth at bedtime.  . sertraline (ZOLOFT) 25 MG tablet Take 25 mg by mouth daily.  Marland Kitchen acetaminophen (TYLENOL) 500 MG tablet Take 500 mg by mouth 3 (three) times daily.  Marland Kitchen albuterol (PROVENTIL HFA;VENTOLIN HFA) 108 (90 Base) MCG/ACT inhaler Inhale 2 puffs into the lungs every 6 (six) hours as needed for wheezing or shortness of breath.  . Diaper Rash Products (DESITIN) OINT Place 1 application rectally as directed. Apply after each bathroom use   No facility-administered encounter medications on file as of 12/10/2016.     Review of Systems  Unable to perform ROS: Dementia    Immunization History  Administered Date(s) Administered  . Influenza  Inj Mdck Quad Pf 01/05/2016   Pertinent  Health Maintenance Due  Topic Date Due  . DEXA SCAN  05/18/1987  . PNA vac Low Risk Adult (1 of 2 - PCV13) 05/18/1987   No flowsheet data found. Functional Status Survey:    Vitals:   12/10/16 1429  BP: 105/60  Pulse: 79  Resp: 20  Temp: 98.1 F (36.7 C)  SpO2: 93%  Weight: 171 lb 14.4 oz (78 kg)   Body mass index is 31.44 kg/m.  Wt Readings from Last 3 Encounters:  12/10/16 171 lb 14.4 oz (78 kg)  11/12/16 164 lb 14.4 oz (74.8 kg)  10/25/16 177 lb (80.3 kg)    Physical Exam  Constitutional:  No distress.  HENT:  Head: Normocephalic and atraumatic.  Neck: No JVD present.  Cardiovascular: Normal rate and regular rhythm.   No murmur heard. Pulmonary/Chest: Effort normal and breath sounds normal. No respiratory distress. She has no wheezes.  Abdominal: Soft. Bowel sounds are normal.  Musculoskeletal: She exhibits no edema.  Neurological: She is alert.  Oriented to self only. Pleasant and able to f/c.  No obvious focal deficit  Skin: Skin is warm and dry. She is not diaphoretic.  Incision well healed  Psychiatric: She has a normal mood and affect.  Vitals reviewed.   Labs reviewed:  Recent Labs  10/02/16 0739 10/03/16 0536 10/04/16 0522 10/09/16  NA 139 141 141 141  K 3.6 4.1 3.9 4.0  CL 103 103 104  --   CO2 28 27 29   --   GLUCOSE 113* 118* 109*  --   BUN 45* 46* 44* 29*  CREATININE 1.61* 1.34* 1.09* 1.1  CALCIUM 8.0* 8.1* 8.3*  --     Recent Labs  09/30/16 0538 10/09/16  AST 28 18  ALT 8* 6*  ALKPHOS 57 71  BILITOT 1.4*  --   PROT 6.5  --   ALBUMIN 3.6  --     Recent Labs  09/29/16 1521  10/01/16 0516 10/02/16 0739 10/03/16 0536 10/04/16 0522 10/09/16  WBC 11.8*  < > 14.2* 9.9 10.4 9.6 9.5  NEUTROABS 8.9*  --  11.1*  --   --   --   --   HGB 12.5  < > 9.0* 8.2* 8.3* 8.1* 9.4*  HCT 36.5  < > 27.8* 25.6* 22.8* 25.5* 28*  MCV 101.7*  < > 95.9 97.3 102.7* 100.0  --   PLT 203  < > 157 144* 175 202  383  < > = values in this interval not displayed. Lab Results  Component Value Date   TSH 4.58 10/10/2016   No results found for: HGBA1C Lab Results  Component Value Date   CHOL  12/12/2009    161        ATP III CLASSIFICATION:  <200     mg/dL   Desirable  947-654  mg/dL   Borderline High  >=650    mg/dL   High          HDL 61 12/12/2009   LDLCALC  12/12/2009    83        Total Cholesterol/HDL:CHD Risk Coronary Heart Disease Risk Table                     Men   Women  1/2 Average Risk   3.4   3.3  Average Risk       5.0   4.4  2 X Average Risk   9.6   7.1  3 X Average Risk  23.4   11.0        Use the calculated Patient Ratio above and the CHD Risk Table to determine the patient's CHD Risk.        ATP III CLASSIFICATION (LDL):  <100     mg/dL   Optimal  354-656  mg/dL   Near or Above                    Optimal  130-159  mg/dL   Borderline  812-751  mg/dL   High  >700     mg/dL   Very High   TRIG 86 17/49/4496   CHOLHDL 2.6 12/12/2009  Significant Diagnostic Results in last 30 days:  No results found.  Assessment/Plan  1. Dementia without behavioral disturbance, unspecified dementia type Progressive cognitive and functional losses.  No behavioral issues.  Would likely not benefit from memory medications Remains on low dose zoloft.  I will not change this at this time as her caregiver reported an occasional tearful episode.  2. Hypoxia Remains on 2L of oxygen which may be in part due to immobility with atx, as well as restrictive physiology due to kyphosis.   She also is known to aspirate but does not appear acutely ill for my visit.  3. Closed fracture of left hip with delayed healing, subsequent encounter Still not able to progress with therapy due to the slow healing process. Luckily she is not in pain.    4. Dysphagia, unspecified type Continue asp prec and no straws  5. CKD (chronic kidney disease) stage 3, GFR 30-59 ml/min Continue to periodically  monitor BMP and avoid nephrotoxic agents  6. Immobility Due to fall with subsequent distal femur fx. After being non ambulatory for such an extended period, she will not likely return to her previous level of function.    Family/ staff Communication: discussed with resident  Labs/tests ordered:  NA

## 2016-12-11 ENCOUNTER — Encounter: Payer: Self-pay | Admitting: Adult Health

## 2016-12-11 DIAGNOSIS — R1310 Dysphagia, unspecified: Secondary | ICD-10-CM

## 2016-12-11 DIAGNOSIS — N183 Chronic kidney disease, stage 3 unspecified: Secondary | ICD-10-CM | POA: Insufficient documentation

## 2016-12-11 HISTORY — DX: Dysphagia, unspecified: R13.10

## 2016-12-12 DIAGNOSIS — R278 Other lack of coordination: Secondary | ICD-10-CM | POA: Diagnosis not present

## 2016-12-12 DIAGNOSIS — S72001S Fracture of unspecified part of neck of right femur, sequela: Secondary | ICD-10-CM | POA: Diagnosis not present

## 2016-12-12 DIAGNOSIS — Z4789 Encounter for other orthopedic aftercare: Secondary | ICD-10-CM | POA: Diagnosis not present

## 2016-12-12 DIAGNOSIS — R2689 Other abnormalities of gait and mobility: Secondary | ICD-10-CM | POA: Diagnosis not present

## 2016-12-25 DIAGNOSIS — R2689 Other abnormalities of gait and mobility: Secondary | ICD-10-CM | POA: Diagnosis not present

## 2016-12-25 DIAGNOSIS — S72001S Fracture of unspecified part of neck of right femur, sequela: Secondary | ICD-10-CM | POA: Diagnosis not present

## 2016-12-25 DIAGNOSIS — Z4789 Encounter for other orthopedic aftercare: Secondary | ICD-10-CM | POA: Diagnosis not present

## 2016-12-25 DIAGNOSIS — R278 Other lack of coordination: Secondary | ICD-10-CM | POA: Diagnosis not present

## 2017-01-01 DIAGNOSIS — Z96642 Presence of left artificial hip joint: Secondary | ICD-10-CM | POA: Diagnosis not present

## 2017-01-01 DIAGNOSIS — S728X9A Other fracture of unspecified femur, initial encounter for closed fracture: Secondary | ICD-10-CM | POA: Diagnosis not present

## 2017-01-01 DIAGNOSIS — R4182 Altered mental status, unspecified: Secondary | ICD-10-CM | POA: Diagnosis not present

## 2017-01-02 DIAGNOSIS — S72001S Fracture of unspecified part of neck of right femur, sequela: Secondary | ICD-10-CM | POA: Diagnosis not present

## 2017-01-02 DIAGNOSIS — R278 Other lack of coordination: Secondary | ICD-10-CM | POA: Diagnosis not present

## 2017-01-02 DIAGNOSIS — R2689 Other abnormalities of gait and mobility: Secondary | ICD-10-CM | POA: Diagnosis not present

## 2017-01-02 DIAGNOSIS — Z4789 Encounter for other orthopedic aftercare: Secondary | ICD-10-CM | POA: Diagnosis not present

## 2017-01-03 DIAGNOSIS — R2689 Other abnormalities of gait and mobility: Secondary | ICD-10-CM | POA: Diagnosis not present

## 2017-01-03 DIAGNOSIS — Z4789 Encounter for other orthopedic aftercare: Secondary | ICD-10-CM | POA: Diagnosis not present

## 2017-01-03 DIAGNOSIS — S72001S Fracture of unspecified part of neck of right femur, sequela: Secondary | ICD-10-CM | POA: Diagnosis not present

## 2017-01-03 DIAGNOSIS — R278 Other lack of coordination: Secondary | ICD-10-CM | POA: Diagnosis not present

## 2017-01-04 DIAGNOSIS — R2689 Other abnormalities of gait and mobility: Secondary | ICD-10-CM | POA: Diagnosis not present

## 2017-01-04 DIAGNOSIS — Z4789 Encounter for other orthopedic aftercare: Secondary | ICD-10-CM | POA: Diagnosis not present

## 2017-01-04 DIAGNOSIS — S72001S Fracture of unspecified part of neck of right femur, sequela: Secondary | ICD-10-CM | POA: Diagnosis not present

## 2017-01-04 DIAGNOSIS — R278 Other lack of coordination: Secondary | ICD-10-CM | POA: Diagnosis not present

## 2017-01-07 DIAGNOSIS — Z4789 Encounter for other orthopedic aftercare: Secondary | ICD-10-CM | POA: Diagnosis not present

## 2017-01-07 DIAGNOSIS — R2689 Other abnormalities of gait and mobility: Secondary | ICD-10-CM | POA: Diagnosis not present

## 2017-01-07 DIAGNOSIS — R278 Other lack of coordination: Secondary | ICD-10-CM | POA: Diagnosis not present

## 2017-01-07 DIAGNOSIS — S72001S Fracture of unspecified part of neck of right femur, sequela: Secondary | ICD-10-CM | POA: Diagnosis not present

## 2017-01-08 ENCOUNTER — Encounter: Payer: Self-pay | Admitting: Internal Medicine

## 2017-01-08 ENCOUNTER — Non-Acute Institutional Stay (SKILLED_NURSING_FACILITY): Payer: Medicare Other | Admitting: Internal Medicine

## 2017-01-08 DIAGNOSIS — F0390 Unspecified dementia without behavioral disturbance: Secondary | ICD-10-CM

## 2017-01-08 DIAGNOSIS — Z7409 Other reduced mobility: Secondary | ICD-10-CM | POA: Diagnosis not present

## 2017-01-08 DIAGNOSIS — M109 Gout, unspecified: Secondary | ICD-10-CM | POA: Diagnosis not present

## 2017-01-08 DIAGNOSIS — R2689 Other abnormalities of gait and mobility: Secondary | ICD-10-CM | POA: Diagnosis not present

## 2017-01-08 DIAGNOSIS — S72001S Fracture of unspecified part of neck of right femur, sequela: Secondary | ICD-10-CM | POA: Diagnosis not present

## 2017-01-08 DIAGNOSIS — Z4789 Encounter for other orthopedic aftercare: Secondary | ICD-10-CM | POA: Diagnosis not present

## 2017-01-08 DIAGNOSIS — I5022 Chronic systolic (congestive) heart failure: Secondary | ICD-10-CM

## 2017-01-08 DIAGNOSIS — R278 Other lack of coordination: Secondary | ICD-10-CM | POA: Diagnosis not present

## 2017-01-08 NOTE — Progress Notes (Signed)
Patient ID: [name], female   DOB: [birth year], 83 y.o.   MRN: [id number]  Location:  [facility] Room Number: 111 Place of Service:  SNF ([phone]) Provider:   [name], DO  Patient Care Team: [name], DO as PCP - General (Geriatric Medicine) [name], NP as Nurse Practitioner (Nurse Practitioner)  Extended Emergency Contact Information Primary Emergency Contact: [name] Address: [address]          [place], [place] [address] [name] Home Phone: [phone] Relation: Other Secondary Emergency Contact: [name] [place] Home Phone: [phone] Relation: Daughter  Code Status:  DNR Goals of care: Advanced Directive information Advanced Directives 01/08/2017  Does Patient Have a Medical Advance Directive? Yes  Type of Advance Directive Out of facility DNR (pink MOST or yellow form);Living will;Healthcare Power of Attorney  Does patient want to make changes to medical advance directive? -  Copy of Healthcare Power of Attorney in Chart? -  Pre-existing out of facility DNR order (yellow form or pink MOST form) Yellow form placed in chart (order not valid for inpatient use);Pink MOST form placed in chart (order not valid for inpatient use)     Chief Complaint  Patient presents with  . Medical Management of Chronic Issues    routine visit    HPI:  Pt is a 83 y.o. female seen today for medical management of chronic diseases.  Spoke with patient but verified all information with nursing staff as her recall is not good due to dementia.There are no complaints at this time. Personal aide is the room at the time of exam. She is in skilled care due to her cognitive impairment and femur fracture back in July.   Dementia- MMSE 18/30 done on 11-06-2016. Speaks of her husband as if he is still alive, does not remember recent events well.  Congestive Heart Failure- No complaints  of shortness of breath. Says she sleeps slightly elevated to help her breath better. Has a pacemaker that was placed March 2013. Has O2 in room but does not use it due to dementia. Is on coreg 3.125 1/2 tablet twice daily.   Gout- Has no complaints of pain in joint at this time, managed on allopurinol 100mg  daily  Left Femur Fracture- Distal femur broken and surgically repaired in July. Per Dr [name] the bone has not healed well and just as of 01/01/17 changed her to [facility] with PT. Has been in a wheelchair since the fracture. She has no complaints of pain but feels like her leg is swollen.  Past Medical History:  Diagnosis Date  . CAD (coronary artery disease)   . Chronic low back pain   . Contusion of foot, right 05/15/12  . Diverticulitis   . Dyslipidemia   . H/O: hysterectomy   . HTN (hypertension)   . Memory disturbance 05/2012   MMSE 26/30, intact Clock test  . Nonischemic cardiomyopathy (HCC)   . Osteoporosis   . Pulmonary embolism (HCC)   . Right knee pain 05/15/12  . Spinal stenosis   . Ulcer disease   . Weight loss    Past Surgical History:  Procedure Laterality Date  . APPENDECTOMY  2010  . BACK SURGERY    . BIV ICD GENERTAOR CHANGE OUT N/A 05/24/2011   Procedure: BIV ICD GENERTAOR CHANGE OUT;  Surgeon: [name], MD;  Location: [facility] CATH LAB;  Service: Cardiovascular;  Laterality: N/A;  . CATARACT  EXTRACTION    . COLON RESECTION  2005  . CORONARY ANGIOPLASTY WITH STENT PLACEMENT  01/2003  . HEMICOLECTOMY    . HIP ARTHROPLASTY Left 04/05/2014   Procedure: ARTHROPLASTY BIPOLAR HIP;  Surgeon: Shelda PalMatthew D Olin, MD;  Location: [facility] ORS;  Service: Orthopedics;  Laterality: Left;  . left shoulder replacement  1991   Dr. Fannie KneeSue  . ORIF HIP FRACTURE Left 09/30/2016   Procedure: OPEN REDUCTION INTERNAL FIXATION PERIPROSTHETIC HIP FRACTURE;  Surgeon: Ollen GrossAluisio, Frank, MD;  Location: [facility] ORS;  Service: Orthopedics;  Laterality: Left;  . right shoulder repair  1985   Dr. Fannie KneeSue  . rotator cuff  debridement  1999   Dr. Despina HickAlusio    Allergies  Allergen Reactions  . Arthrotec [Diclofenac-Misoprostol]   . Ibuprofen   . Lactose Intolerance (Gi)   . Milk-Related Compounds Nausea And Vomiting    Cheese  . Other     Grass and ragweed   . Oxycontin [Oxycodone Hcl]   . Pollen Extract     Outpatient Encounter Prescriptions as of 01/08/2017  Medication Sig  . acetaminophen (TYLENOL) 500 MG tablet Take 500 mg by mouth 3 (three) times daily.  Marland Kitchen. albuterol (PROVENTIL HFA;VENTOLIN HFA) 108 (90 Base) MCG/ACT inhaler Inhale 2 puffs into the lungs every 6 (six) hours as needed for wheezing or shortness of breath.  . allopurinol (ZYLOPRIM) 100 MG tablet Take 100 mg by mouth daily.  . Carboxymethylcellul-Glycerin (OPTIVE) 0.5-0.9 % SOLN Apply 2 drops to eye 2 (two) times daily.  . carvedilol (COREG) 3.125 MG tablet Take 1.5625 mg by mouth 2 (two) times daily with a meal.   . cyanocobalamin 1000 MCG tablet Take 1,000 mcg by mouth daily.   . Diaper Rash Products (DESITIN) OINT Place 1 application rectally as directed. Apply after each bathroom use  . docusate (COLACE) 50 MG/5ML liquid Take 100 mg by mouth daily.  . furosemide (LASIX) 20 MG tablet Take 20 mg by mouth every other day. Alternate with 40mg  lasix  . furosemide (LASIX) 40 MG tablet Take 40 mg by mouth every other day. Alternate with 20mg  lasix  . ipratropium-albuterol (DUONEB) 0.5-2.5 (3) MG/3ML SOLN Take 3 mLs by nebulization every 6 (six) hours as needed.  . lactose free nutrition (BOOST PLUS) LIQD Take 237 mLs by mouth 3 (three) times daily with meals.  . nitroGLYCERIN (NITROSTAT) 0.4 MG SL tablet Place 0.4 mg under the tongue every 5 (five) minutes as needed for chest pain.  . polyethylene glycol (MIRALAX / GLYCOLAX) packet Take 17 g by mouth daily. Hold for loose stools  . ranitidine (ZANTAC) 150 MG tablet Take 150 mg by mouth at bedtime.  . sertraline (ZOLOFT) 25 MG tablet Take 25 mg by mouth daily.  . [DISCONTINUED] lactose free  nutrition (BOOST) LIQD Take 237 mLs by mouth daily.    No facility-administered encounter medications on file as of 01/08/2017.     Review of Systems  Constitutional: Negative for appetite change, fatigue and unexpected weight change.  Respiratory: Positive for choking.   Cardiovascular: Negative for chest pain and palpitations.       Pacemaker  Genitourinary:       Wears briefs  Musculoskeletal: Positive for joint swelling.  Neurological: Negative for dizziness, light-headedness and numbness.  Psychiatric/Behavioral: Positive for confusion. Negative for agitation, behavioral problems and sleep disturbance.    Immunization History  Administered Date(s) Administered  . Influenza Inj Mdck Quad Pf 01/05/2016   Pertinent  Health Maintenance Due  Topic Date Due  . DEXA  SCAN  05/18/1987  . PNA vac Low Risk Adult (1 of 2 - PCV13) 05/18/1987   No flowsheet data found. Functional Status Survey:    Vitals:   01/08/17 1124  BP: 111/72  Pulse: 78  Resp: 16  Temp: 98 F (36.7 C)  TempSrc: Oral  SpO2: 95%  Weight: 180 lb (81.6 kg)   Body mass index is 32.92 kg/m. Physical Exam  Constitutional: She appears well-developed and well-nourished.  Cardiovascular: Normal rate.   Pulmonary/Chest: Effort normal. She has no wheezes. She has rhonchi in the right upper field.  Rhonchi RUL, would not cough on demand to clear or take a deep breath.   Abdominal: Soft. Bowel sounds are normal.  Neurological: She is alert. She is disoriented.  Orient to self only, cooperates with exam but does not take deep breaths or cough when asked.  Psychiatric: She has a normal mood and affect. Her speech is normal and behavior is normal. Cognition and memory are impaired. She exhibits abnormal recent memory and abnormal remote memory.    Labs reviewed:  Recent Labs  10/02/16 0739 10/03/16 0536 10/04/16 0522 10/09/16  NA 139 141 141 141  K 3.6 4.1 3.9 4.0  CL 103 103 104  --   CO2 28 27 29   --     GLUCOSE 113* 118* 109*  --   BUN 45* 46* 44* 29*  CREATININE 1.61* 1.34* 1.09* 1.1  CALCIUM 8.0* 8.1* 8.3*  --     Recent Labs  09/30/16 0538 10/09/16  AST 28 18  ALT 8* 6*  ALKPHOS 57 71  BILITOT 1.4*  --   PROT 6.5  --   ALBUMIN 3.6  --     Recent Labs  09/29/16 1521  10/01/16 0516 10/02/16 0739 10/03/16 0536 10/04/16 0522 10/09/16  WBC 11.8*  < > 14.2* 9.9 10.4 9.6 9.5  NEUTROABS 8.9*  --  11.1*  --   --   --   --   HGB 12.5  < > 9.0* 8.2* 8.3* 8.1* 9.4*  HCT 36.5  < > 27.8* 25.6* 22.8* 25.5* 28*  MCV 101.7*  < > 95.9 97.3 102.7* 100.0  --   PLT 203  < > 157 144* 175 202 383  < > = values in this interval not displayed. Lab Results  Component Value Date   TSH 4.58 10/10/2016   No results found for: HGBA1C Lab Results  Component Value Date   CHOL  12/12/2009    161        ATP III CLASSIFICATION:  <200     mg/dL   Desirable  594-707  mg/dL   Borderline High  >=615    mg/dL   High          HDL 61 12/12/2009   LDLCALC  12/12/2009    83        Total Cholesterol/HDL:CHD Risk Coronary Heart Disease Risk Table                     Men   Women  1/2 Average Risk   3.4   3.3  Average Risk       5.0   4.4  2 X Average Risk   9.6   7.1  3 X Average Risk  23.4   11.0        Use the calculated Patient Ratio above and the CHD Risk Table to determine the patient's CHD Risk.  ATP III CLASSIFICATION (LDL):  <100     mg/dL   Optimal  161-096  mg/dL   Near or Above                    Optimal  130-159  mg/dL   Borderline  045-409  mg/dL   High  >811     mg/dL   Very High   TRIG 86 91/47/8295   CHOLHDL 2.6 12/12/2009    Significant Diagnostic Results in last 30 days:  No results found.  Assessment/Plan 1. Chronic systolic heart failure (HCC) Continue Coreg as ordered. BP is within acceptable goal.   2. Immobility Continue to work with PT now that she has been cleared for WBAT  3. Dementia without behavioral disturbance, unspecified dementia  type Has an Aide with her, Continue Zoloft  4. Acute gout of left wrist, unspecified cause Having no issues at this time, continue allopurinol  Family/ staff Communication: Spoke with staff  Labs/tests ordered:  No new  Weslie Rasmus L. Declan Adamson, D.O. Geriatrics [facility] [address], [place] [address] Cell Phone (Mon-Fri 8am-5pm):  [phone] On Call:  [phone] & follow prompts after 5pm & weekends Office Phone:  [phone] Office Fax:  [phone]

## 2017-01-08 NOTE — Progress Notes (Signed)
Patient ID: [name], female   DOB: 1920-04-30, 83 y.o.   MRN: [id number]

## 2017-01-09 ENCOUNTER — Encounter: Payer: Medicare Other | Admitting: Internal Medicine

## 2017-01-09 DIAGNOSIS — S72001S Fracture of unspecified part of neck of right femur, sequela: Secondary | ICD-10-CM | POA: Diagnosis not present

## 2017-01-09 DIAGNOSIS — Z4789 Encounter for other orthopedic aftercare: Secondary | ICD-10-CM | POA: Diagnosis not present

## 2017-01-09 DIAGNOSIS — R2689 Other abnormalities of gait and mobility: Secondary | ICD-10-CM | POA: Diagnosis not present

## 2017-01-09 DIAGNOSIS — R278 Other lack of coordination: Secondary | ICD-10-CM | POA: Diagnosis not present

## 2017-01-10 DIAGNOSIS — S72001S Fracture of unspecified part of neck of right femur, sequela: Secondary | ICD-10-CM | POA: Diagnosis not present

## 2017-01-10 DIAGNOSIS — R2689 Other abnormalities of gait and mobility: Secondary | ICD-10-CM | POA: Diagnosis not present

## 2017-01-10 DIAGNOSIS — Z4789 Encounter for other orthopedic aftercare: Secondary | ICD-10-CM | POA: Diagnosis not present

## 2017-01-10 DIAGNOSIS — R278 Other lack of coordination: Secondary | ICD-10-CM | POA: Diagnosis not present

## 2017-01-11 DIAGNOSIS — Z4789 Encounter for other orthopedic aftercare: Secondary | ICD-10-CM | POA: Diagnosis not present

## 2017-01-11 DIAGNOSIS — R278 Other lack of coordination: Secondary | ICD-10-CM | POA: Diagnosis not present

## 2017-01-11 DIAGNOSIS — S72001S Fracture of unspecified part of neck of right femur, sequela: Secondary | ICD-10-CM | POA: Diagnosis not present

## 2017-01-11 DIAGNOSIS — R2689 Other abnormalities of gait and mobility: Secondary | ICD-10-CM | POA: Diagnosis not present

## 2017-01-16 DIAGNOSIS — R278 Other lack of coordination: Secondary | ICD-10-CM | POA: Diagnosis not present

## 2017-01-16 DIAGNOSIS — S72001S Fracture of unspecified part of neck of right femur, sequela: Secondary | ICD-10-CM | POA: Diagnosis not present

## 2017-01-16 DIAGNOSIS — R2689 Other abnormalities of gait and mobility: Secondary | ICD-10-CM | POA: Diagnosis not present

## 2017-01-16 DIAGNOSIS — Z4789 Encounter for other orthopedic aftercare: Secondary | ICD-10-CM | POA: Diagnosis not present

## 2017-01-17 DIAGNOSIS — S72001S Fracture of unspecified part of neck of right femur, sequela: Secondary | ICD-10-CM | POA: Diagnosis not present

## 2017-01-17 DIAGNOSIS — R278 Other lack of coordination: Secondary | ICD-10-CM | POA: Diagnosis not present

## 2017-01-17 DIAGNOSIS — Z4789 Encounter for other orthopedic aftercare: Secondary | ICD-10-CM | POA: Diagnosis not present

## 2017-01-17 DIAGNOSIS — R2689 Other abnormalities of gait and mobility: Secondary | ICD-10-CM | POA: Diagnosis not present

## 2017-01-21 DIAGNOSIS — S72001S Fracture of unspecified part of neck of right femur, sequela: Secondary | ICD-10-CM | POA: Diagnosis not present

## 2017-01-21 DIAGNOSIS — R278 Other lack of coordination: Secondary | ICD-10-CM | POA: Diagnosis not present

## 2017-01-21 DIAGNOSIS — Z4789 Encounter for other orthopedic aftercare: Secondary | ICD-10-CM | POA: Diagnosis not present

## 2017-01-21 DIAGNOSIS — R2689 Other abnormalities of gait and mobility: Secondary | ICD-10-CM | POA: Diagnosis not present

## 2017-01-22 DIAGNOSIS — R1313 Dysphagia, pharyngeal phase: Secondary | ICD-10-CM | POA: Diagnosis not present

## 2017-01-22 DIAGNOSIS — Z4789 Encounter for other orthopedic aftercare: Secondary | ICD-10-CM | POA: Diagnosis not present

## 2017-01-22 DIAGNOSIS — S72001S Fracture of unspecified part of neck of right femur, sequela: Secondary | ICD-10-CM | POA: Diagnosis not present

## 2017-01-22 DIAGNOSIS — R2689 Other abnormalities of gait and mobility: Secondary | ICD-10-CM | POA: Diagnosis not present

## 2017-01-22 DIAGNOSIS — R278 Other lack of coordination: Secondary | ICD-10-CM | POA: Diagnosis not present

## 2017-01-23 DIAGNOSIS — R278 Other lack of coordination: Secondary | ICD-10-CM | POA: Diagnosis not present

## 2017-01-23 DIAGNOSIS — Z4789 Encounter for other orthopedic aftercare: Secondary | ICD-10-CM | POA: Diagnosis not present

## 2017-01-23 DIAGNOSIS — R2689 Other abnormalities of gait and mobility: Secondary | ICD-10-CM | POA: Diagnosis not present

## 2017-01-23 DIAGNOSIS — S72001S Fracture of unspecified part of neck of right femur, sequela: Secondary | ICD-10-CM | POA: Diagnosis not present

## 2017-01-23 DIAGNOSIS — R1313 Dysphagia, pharyngeal phase: Secondary | ICD-10-CM | POA: Diagnosis not present

## 2017-01-24 DIAGNOSIS — Z4789 Encounter for other orthopedic aftercare: Secondary | ICD-10-CM | POA: Diagnosis not present

## 2017-01-24 DIAGNOSIS — R1313 Dysphagia, pharyngeal phase: Secondary | ICD-10-CM | POA: Diagnosis not present

## 2017-01-25 DIAGNOSIS — R278 Other lack of coordination: Secondary | ICD-10-CM | POA: Diagnosis not present

## 2017-01-25 DIAGNOSIS — Z4789 Encounter for other orthopedic aftercare: Secondary | ICD-10-CM | POA: Diagnosis not present

## 2017-01-25 DIAGNOSIS — R2689 Other abnormalities of gait and mobility: Secondary | ICD-10-CM | POA: Diagnosis not present

## 2017-01-25 DIAGNOSIS — S72001S Fracture of unspecified part of neck of right femur, sequela: Secondary | ICD-10-CM | POA: Diagnosis not present

## 2017-01-28 ENCOUNTER — Non-Acute Institutional Stay (SKILLED_NURSING_FACILITY): Payer: Medicare Other | Admitting: Adult Health

## 2017-01-28 ENCOUNTER — Encounter: Payer: Self-pay | Admitting: Adult Health

## 2017-01-28 DIAGNOSIS — R1313 Dysphagia, pharyngeal phase: Secondary | ICD-10-CM | POA: Diagnosis not present

## 2017-01-28 DIAGNOSIS — N183 Chronic kidney disease, stage 3 unspecified: Secondary | ICD-10-CM

## 2017-01-28 DIAGNOSIS — Z4789 Encounter for other orthopedic aftercare: Secondary | ICD-10-CM | POA: Diagnosis not present

## 2017-01-28 DIAGNOSIS — R278 Other lack of coordination: Secondary | ICD-10-CM | POA: Diagnosis not present

## 2017-01-28 DIAGNOSIS — I5023 Acute on chronic systolic (congestive) heart failure: Secondary | ICD-10-CM

## 2017-01-28 DIAGNOSIS — R0600 Dyspnea, unspecified: Secondary | ICD-10-CM

## 2017-01-28 DIAGNOSIS — S72001S Fracture of unspecified part of neck of right femur, sequela: Secondary | ICD-10-CM | POA: Diagnosis not present

## 2017-01-28 DIAGNOSIS — R0609 Other forms of dyspnea: Secondary | ICD-10-CM

## 2017-01-28 DIAGNOSIS — R2689 Other abnormalities of gait and mobility: Secondary | ICD-10-CM | POA: Diagnosis not present

## 2017-01-28 NOTE — Progress Notes (Signed)
Location:  Medical illustrator of Service:  SNF (31) Provider:   [name], ANP [facility] [phone]   Christine Balo, DO  Patient Care Team: Christine Balo, DO as PCP - General (Geriatric Medicine) [name], NP as Nurse Practitioner (Nurse Practitioner)  Extended Emergency Contact Information Primary Emergency Contact: [name] Address: [address]          [place], [place] [address] [name] Home Phone: [phone] Relation: Other Secondary Emergency Contact: [name] [place] Home Phone: [phone] Relation: Daughter  Code Status:  DNR Goals of care: Advanced Directive information Advanced Directives 01/08/2017  Does Patient Have a Medical Advance Directive? Yes  Type of Advance Directive Out of facility DNR (pink MOST or yellow form);Living will;Healthcare Power of Attorney  Does patient want to make changes to medical advance directive? -  Copy of Healthcare Power of Attorney in Chart? -  Pre-existing out of facility DNR order (yellow form or pink MOST form) Yellow form placed in chart (order not valid for inpatient use);Pink MOST form placed in chart (order not valid for inpatient use)     Chief Complaint  Patient presents with  . Acute Visit    weight gain    HPI:  Pt is a 83 y.o. female seen today for an acute visit for weight gain and shortness of breath.  She has a hx of nonischemic cardiomyopathy, systolic CHF class 2 with an EF of <35% (no recent echo epic for review).   The staff report that she is more short of breath with exertion. She has begun walking with physical therapy after sustaining a hip fracture in July of 2018.  The staff also report increased edema to her ankles and steady weight gain. Weights are as follows 168, 171, 174, 175.6  There is one weight of 180 lbs but seems to be the outlier.  They did note that she is being weighed on a  different scale now that she is able to bear weight. [name] denies any symptoms during my interview but has advanced dementia and tends not to verbalize discomfort. She is not short of breath at rest and does not have angina. She has been oxygen dependent since her surgery in July.  The did take her off oxygen for a short period of time but she ended up needing it again with sats dropping into the 80's at times. An xray was ordered for this reason in August (and for cough) but it was unrevealing.  CXR 2 view 11/12/16: mild enlarged heart, large hiatal hernia, no edema or infiltrate  Past Medical History:  Diagnosis Date  . CAD (coronary artery disease)   . Chronic low back pain   . Contusion of foot, right 05/15/12  . Diverticulitis   . Dyslipidemia   . H/O: hysterectomy   . HTN (hypertension)   . Memory disturbance 05/2012   MMSE 26/30, intact Clock test  . Nonischemic cardiomyopathy (HCC)   . Osteoporosis   . Pulmonary embolism (HCC)   . Right knee pain 05/15/12  . Spinal stenosis   . Ulcer disease   . Weight loss    Past Surgical History:  Procedure Laterality Date  . APPENDECTOMY  2010  . BACK SURGERY    . CATARACT EXTRACTION    . COLON RESECTION  2005  . CORONARY ANGIOPLASTY WITH STENT PLACEMENT  01/2003  . HEMICOLECTOMY    . left shoulder  replacement  1991   Dr. [name]  . right shoulder repair  1985   Dr. [name]  . rotator cuff debridement  1999   Dr. [name]    Allergies  Allergen Reactions  . Arthrotec [Diclofenac-Misoprostol]   . Ibuprofen   . Lactose Intolerance (Gi)   . Milk-Related Compounds Nausea And Vomiting    Cheese  . Other     Grass and ragweed   . Oxycontin [Oxycodone Hcl]   . Pollen Extract     Outpatient Encounter Medications as of 01/28/2017  Medication Sig  . acetaminophen (TYLENOL) 500 MG tablet Take 500 mg by mouth 3 (three) times daily.  Marland Kitchen albuterol (PROVENTIL HFA;VENTOLIN HFA) 108 (90 Base) MCG/ACT inhaler Inhale 2 puffs into the lungs  every 6 (six) hours as needed for wheezing or shortness of breath.  . allopurinol (ZYLOPRIM) 100 MG tablet Take 100 mg by mouth daily.  . Carboxymethylcellul-Glycerin (OPTIVE) 0.5-0.9 % SOLN Apply 2 drops to eye 2 (two) times daily.  . carvedilol (COREG) 3.125 MG tablet Take 1.5625 mg by mouth 2 (two) times daily with a meal.   . cyanocobalamin 1000 MCG tablet Take 1,000 mcg by mouth daily.   . Diaper Rash Products (DESITIN) OINT Place 1 application rectally as directed. Apply after each bathroom use  . docusate (COLACE) 50 MG/5ML liquid Take 100 mg by mouth daily.  . furosemide (LASIX) 20 MG tablet Take 20 mg by mouth every other day. Alternate with 40mg  lasix  . furosemide (LASIX) 40 MG tablet Take 40 mg by mouth every other day. Alternate with 20mg  lasix  . ipratropium-albuterol (DUONEB) 0.5-2.5 (3) MG/3ML SOLN Take 3 mLs by nebulization every 6 (six) hours as needed.  . lactose free nutrition (BOOST PLUS) LIQD Take 237 mLs daily by mouth.   . nitroGLYCERIN (NITROSTAT) 0.4 MG SL tablet Place 0.4 mg under the tongue every 5 (five) minutes as needed for chest pain.  . polyethylene glycol (MIRALAX / GLYCOLAX) packet Take 17 g by mouth daily. Hold for loose stools  . ranitidine (ZANTAC) 150 MG tablet Take 150 mg by mouth at bedtime.  . sertraline (ZOLOFT) 25 MG tablet Take 25 mg by mouth daily.   No facility-administered encounter medications on file as of 01/28/2017.     Review of Systems  Constitutional: Positive for unexpected weight change. Negative for activity change, appetite change, chills, diaphoresis, fatigue and fever.  HENT: Negative for congestion.   Respiratory: Positive for shortness of breath (with exertion). Negative for cough and wheezing.   Cardiovascular: Positive for leg swelling. Negative for chest pain and palpitations.  Gastrointestinal: Negative for abdominal distention, abdominal pain, constipation and diarrhea.  Genitourinary: Negative for difficulty urinating and  dysuria.  Musculoskeletal: Positive for gait problem. Negative for arthralgias, back pain, joint swelling and myalgias.  Neurological: Negative for dizziness, tremors, seizures, syncope, facial asymmetry, speech difficulty, weakness, light-headedness, numbness and headaches.  Psychiatric/Behavioral: Positive for confusion. Negative for agitation and behavioral problems.    Immunization History  Administered Date(s) Administered  . Influenza Inj Mdck Quad Pf 01/05/2016   Pertinent  Health Maintenance Due  Topic Date Due  . DEXA SCAN  05/18/1987  . PNA vac Low Risk Adult (1 of 2 - PCV13) 05/18/1987   No flowsheet data found. Functional Status Survey:    Vitals:   01/28/17 0939  BP: 113/73  Pulse: 81  Resp: 20  Temp: 98.3 F (36.8 C)  SpO2: 98%  Weight: 176 lb 8 oz (80.1 kg)   Body  mass index is 32.28 kg/m. Physical Exam  Constitutional: No distress.  HENT:  Head: Normocephalic and atraumatic.  Nose: Nose normal.  Mouth/Throat: Oropharynx is clear and moist. No oropharyngeal exudate.  Neck: No JVD present.  Cardiovascular: Normal rate and regular rhythm.  No murmur heard. Ankle edema bilateral +1  Pulmonary/Chest: Effort normal. No respiratory distress. She has no wheezes.  Bilateral decreased bases  Abdominal: Soft. Bowel sounds are normal. She exhibits no distension.  Neurological: She is alert.  Oriented to self only, able to f/c  Skin: Skin is warm and dry. She is not diaphoretic.  Psychiatric: She has a normal mood and affect.  Vitals reviewed.   Labs reviewed: Recent Labs    10/02/16 0739 10/03/16 0536 10/04/16 0522 10/09/16  NA 139 141 141 141  K 3.6 4.1 3.9 4.0  CL 103 103 104  --   CO2 28 27 29   --   GLUCOSE 113* 118* 109*  --   BUN 45* 46* 44* 29*  CREATININE 1.61* 1.34* 1.09* 1.1  CALCIUM 8.0* 8.1* 8.3*  --    Recent Labs    09/30/16 0538 10/09/16  AST 28 18  ALT 8* 6*  ALKPHOS 57 71  BILITOT 1.4*  --   PROT 6.5  --   ALBUMIN 3.6  --      Recent Labs    09/29/16 1521  10/01/16 0516 10/02/16 0739 10/03/16 0536 10/04/16 0522 10/09/16  WBC 11.8*   < > 14.2* 9.9 10.4 9.6 9.5  NEUTROABS 8.9*  --  11.1*  --   --   --   --   HGB 12.5   < > 9.0* 8.2* 8.3* 8.1* 9.4*  HCT 36.5   < > 27.8* 25.6* 22.8* 25.5* 28*  MCV 101.7*   < > 95.9 97.3 102.7* 100.0  --   PLT 203   < > 157 144* 175 202 383   < > = values in this interval not displayed.   Lab Results  Component Value Date   TSH 4.58 10/10/2016   No results found for: HGBA1C Lab Results  Component Value Date   CHOL  12/12/2009    161        ATP III CLASSIFICATION:  <200     mg/dL   Desirable  098-119200-239  mg/dL   Borderline High  >=147>=240    mg/dL   High          HDL 61 12/12/2009   LDLCALC  12/12/2009    83        Total Cholesterol/HDL:CHD Risk Coronary Heart Disease Risk Table                     Men   Women  1/2 Average Risk   3.4   3.3  Average Risk       5.0   4.4  2 X Average Risk   9.6   7.1  3 X Average Risk  23.4   11.0        Use the calculated Patient Ratio above and the CHD Risk Table to determine the patient's CHD Risk.        ATP III CLASSIFICATION (LDL):  <100     mg/dL   Optimal  829-562100-129  mg/dL   Near or Above                    Optimal  130-159  mg/dL   Borderline  130-865160-189  mg/dL  High  >190     mg/dL   Very High   TRIG 86 40/98/1191   CHOLHDL 2.6 12/12/2009    Significant Diagnostic Results in last 30 days:  No results found.  Assessment/Plan  1) Acute on chronic systolic CHF Increase lasix to 40 mg qd for 4 days then return to previous alternating dosing 40 and 20 Kdur 20 meq qd x 4 days Compression hose on in the am and off in the pm   2) CKD III Continue to monitor BMP and avoid nephrotoxic agents  3) Dyspnea on exertion She does appear to have gained some weight although the weights seem to fluctuate. She is also be deconditioned which contributes to this problem. Atelectasis also may be a contributing factor.  Labs  ordered to evaluate for worsening renal failure and/or anemia  Her family does not want any aggressive measures taken in regards to her care. Her goals of care are comfort based. They would like treatment for reversible conditions. (I have spoke to her daughter about this before)   Family/ staff Communication: discussed with nurse  Labs/tests ordered:  CMP CBC B12

## 2017-01-29 DIAGNOSIS — S72001S Fracture of unspecified part of neck of right femur, sequela: Secondary | ICD-10-CM | POA: Diagnosis not present

## 2017-01-29 DIAGNOSIS — R278 Other lack of coordination: Secondary | ICD-10-CM | POA: Diagnosis not present

## 2017-01-29 DIAGNOSIS — R1313 Dysphagia, pharyngeal phase: Secondary | ICD-10-CM | POA: Diagnosis not present

## 2017-01-29 DIAGNOSIS — Z4789 Encounter for other orthopedic aftercare: Secondary | ICD-10-CM | POA: Diagnosis not present

## 2017-01-29 DIAGNOSIS — R2689 Other abnormalities of gait and mobility: Secondary | ICD-10-CM | POA: Diagnosis not present

## 2017-01-29 LAB — BASIC METABOLIC PANEL
BUN: 17 (ref 4–21)
Creatinine: 0.8 (ref 0.5–1.1)
Glucose: 94
Potassium: 4.4 (ref 3.4–5.3)
Sodium: 144 (ref 137–147)

## 2017-01-29 LAB — HEPATIC FUNCTION PANEL
ALT: 7 (ref 7–35)
AST: 15 (ref 13–35)
Alkaline Phosphatase: 113 (ref 25–125)
Bilirubin, Total: 0.3

## 2017-01-29 LAB — CBC AND DIFFERENTIAL
HCT: 36 (ref 36–46)
Hemoglobin: 11.6 — AB (ref 12.0–16.0)
Platelets: 200 (ref 150–399)
WBC: 8.3

## 2017-01-29 LAB — VITAMIN B12: Vitamin B-12: 1270

## 2017-01-30 DIAGNOSIS — R2689 Other abnormalities of gait and mobility: Secondary | ICD-10-CM | POA: Diagnosis not present

## 2017-01-30 DIAGNOSIS — Z4789 Encounter for other orthopedic aftercare: Secondary | ICD-10-CM | POA: Diagnosis not present

## 2017-01-30 DIAGNOSIS — R278 Other lack of coordination: Secondary | ICD-10-CM | POA: Diagnosis not present

## 2017-01-30 DIAGNOSIS — S72001S Fracture of unspecified part of neck of right femur, sequela: Secondary | ICD-10-CM | POA: Diagnosis not present

## 2017-01-31 DIAGNOSIS — Z4789 Encounter for other orthopedic aftercare: Secondary | ICD-10-CM | POA: Diagnosis not present

## 2017-01-31 DIAGNOSIS — R2689 Other abnormalities of gait and mobility: Secondary | ICD-10-CM | POA: Diagnosis not present

## 2017-01-31 DIAGNOSIS — R278 Other lack of coordination: Secondary | ICD-10-CM | POA: Diagnosis not present

## 2017-01-31 DIAGNOSIS — S72001S Fracture of unspecified part of neck of right femur, sequela: Secondary | ICD-10-CM | POA: Diagnosis not present

## 2017-01-31 DIAGNOSIS — R1313 Dysphagia, pharyngeal phase: Secondary | ICD-10-CM | POA: Diagnosis not present

## 2017-02-01 DIAGNOSIS — R2689 Other abnormalities of gait and mobility: Secondary | ICD-10-CM | POA: Diagnosis not present

## 2017-02-01 DIAGNOSIS — Z4789 Encounter for other orthopedic aftercare: Secondary | ICD-10-CM | POA: Diagnosis not present

## 2017-02-01 DIAGNOSIS — S72001S Fracture of unspecified part of neck of right femur, sequela: Secondary | ICD-10-CM | POA: Diagnosis not present

## 2017-02-01 DIAGNOSIS — R278 Other lack of coordination: Secondary | ICD-10-CM | POA: Diagnosis not present

## 2017-02-04 DIAGNOSIS — R278 Other lack of coordination: Secondary | ICD-10-CM | POA: Diagnosis not present

## 2017-02-04 DIAGNOSIS — R2689 Other abnormalities of gait and mobility: Secondary | ICD-10-CM | POA: Diagnosis not present

## 2017-02-04 DIAGNOSIS — S72001S Fracture of unspecified part of neck of right femur, sequela: Secondary | ICD-10-CM | POA: Diagnosis not present

## 2017-02-04 DIAGNOSIS — Z4789 Encounter for other orthopedic aftercare: Secondary | ICD-10-CM | POA: Diagnosis not present

## 2017-02-05 ENCOUNTER — Encounter: Payer: Self-pay | Admitting: Internal Medicine

## 2017-02-05 ENCOUNTER — Non-Acute Institutional Stay (SKILLED_NURSING_FACILITY): Payer: Medicare Other | Admitting: Internal Medicine

## 2017-02-05 DIAGNOSIS — R1313 Dysphagia, pharyngeal phase: Secondary | ICD-10-CM | POA: Diagnosis not present

## 2017-02-05 DIAGNOSIS — F0150 Vascular dementia without behavioral disturbance: Secondary | ICD-10-CM

## 2017-02-05 DIAGNOSIS — R410 Disorientation, unspecified: Secondary | ICD-10-CM

## 2017-02-05 DIAGNOSIS — N183 Chronic kidney disease, stage 3 unspecified: Secondary | ICD-10-CM

## 2017-02-05 DIAGNOSIS — I5023 Acute on chronic systolic (congestive) heart failure: Secondary | ICD-10-CM

## 2017-02-05 DIAGNOSIS — Z4789 Encounter for other orthopedic aftercare: Secondary | ICD-10-CM | POA: Diagnosis not present

## 2017-02-05 DIAGNOSIS — R0602 Shortness of breath: Secondary | ICD-10-CM | POA: Diagnosis not present

## 2017-02-05 DIAGNOSIS — D649 Anemia, unspecified: Secondary | ICD-10-CM | POA: Diagnosis not present

## 2017-02-05 DIAGNOSIS — R0789 Other chest pain: Secondary | ICD-10-CM

## 2017-02-05 LAB — CBC AND DIFFERENTIAL
HEMATOCRIT: 34 — AB (ref 36–46)
Hemoglobin: 10.2 — AB (ref 12.0–16.0)
Neutrophils Absolute: 10
Platelets: 163 (ref 150–399)
WBC: 13.3

## 2017-02-05 LAB — BASIC METABOLIC PANEL
BUN: 26 — AB (ref 4–21)
Creatinine: 1 (ref 0.5–1.1)
GLUCOSE: 112
Potassium: 4.2 (ref 3.4–5.3)
SODIUM: 140 (ref 137–147)

## 2017-02-05 NOTE — Progress Notes (Signed)
Patient ID: [name], female   DOB: [birth year], 83 y.o.   MRN: [id number]  Location:  [facility] Room Number: 111 Place of Service:  SNF [phone]) Provider:   [name], DO  Patient Care Team: [name], DO as PCP - General (Geriatric Medicine) [name], NP as Nurse Practitioner (Nurse Practitioner)  Extended Emergency Contact Information Primary Emergency Contact: [name] Address: [address]          [place], [place] [address] [name] Home Phone: [phone] Relation: Other Secondary Emergency Contact: [name] [place] Home Phone: [phone] Relation: Daughter  Code Status:  DNR Goals of care: Advanced Directive information Advanced Directives 02/09/2017  Does Patient Have a Medical Advance Directive? Yes  Type of Advance Directive Out of facility DNR (pink MOST or yellow form)  Does patient want to make changes to medical advance directive? No - Patient declined  Copy of Healthcare Power of Attorney in Chart? Yes  Pre-existing out of facility DNR order (yellow form or pink MOST form) Yellow form placed in chart (order not valid for inpatient use)    Chief Complaint  Patient presents with  . Acute Visit    chest pain    HPI:  Pt is a 83 y.o. female with h/o known CAD, htn, dementia, nonischemic cardiomyopathy, PE seen today for an acute visit for chest pain last night.  Pt had received nitroglycerin three times and a nebulizer treatment and her left-sided chest pain had resolved.  Nursing had noticed she was wheezing slightly and the nebulizer seemed to be what actually relieved her pain. She'd just been seen 11/12 by NP Wert and she had increased her lasix to 40mg  daily for 4 days (from 40mg  alternating with 20mg  that she had been on).  Her weight and edema did improve with this, but weight has trended back up slightly and edema of extremities worsened.   Compression hose were ordered (previously had them, but somehow no longer did--?refusal). She is pleasantly confused, complimenting my white coat and talking about plans for her family to come visit at Thanksgiving.  This morning, she has complained of lower abdominal pain on both sides, but no nausea, vomiting or diarrhea.  I had ordered labs and a CXR when I received the call about her last night.  Her family had not wanted an aggressive workup due to her progressive dementia and comfort care goals.  Labs had returned with leukocytosis of 13.3 with left shift (10.4), mild stable anemia, normal electrolytes and renal function.  Appetite had also been poor the past two days.  She denied dysuria, frequency, urgency, but is incontinent and wears depends.  She is also a poor historian with her dementia.  She now denies any abdominal pain or shortness of breath.  She was slightly winded during positional change to sit up for the examination.  She's been having regular bowel movements.  No choking episodes noted.  Past Medical History:  Diagnosis Date  . CAD (coronary artery disease)   . Chronic low back pain   . Contusion of foot, right 05/15/12  . Diverticulitis   . Dyslipidemia   . H/O: hysterectomy   . HTN (hypertension)   . Memory disturbance 05/2012   MMSE 26/30, intact Clock test  . Nonischemic cardiomyopathy (HCC)   . Osteoporosis   . Pulmonary embolism (HCC)   . Right knee pain 05/15/12  . Spinal stenosis   .  Ulcer disease   . Weight loss    Past Surgical History:  Procedure Laterality Date  . APPENDECTOMY  2010  . BACK SURGERY    . BIV ICD GENERTAOR CHANGE OUT N/A 05/24/2011   Procedure: BIV ICD GENERTAOR CHANGE OUT;  Surgeon: Marinus MawGregg W Taylor, MD;  [facility];  Service: Cardiovascular;  Laterality: N/A;  . CATARACT EXTRACTION    . COLON RESECTION  2005  . CORONARY ANGIOPLASTY WITH STENT PLACEMENT  01/2003  . HEMICOLECTOMY    . HIP ARTHROPLASTY Left 04/05/2014   Procedure:  ARTHROPLASTY BIPOLAR HIP;  Surgeon: Shelda PalMatthew D Olin, MD;  Location: [facility] ORS;  Service: Orthopedics;  Laterality: Left;  . left shoulder replacement  1991   Dr. Fannie KneeSue  . ORIF HIP FRACTURE Left 09/30/2016   Procedure: OPEN REDUCTION INTERNAL FIXATION PERIPROSTHETIC HIP FRACTURE;  Surgeon: Ollen GrossAluisio, Frank, MD;  Location: [facility] ORS;  Service: Orthopedics;  Laterality: Left;  . right shoulder repair  1985   Dr. Fannie KneeSue  . rotator cuff debridement  1999   Dr. Despina HickAlusio    Allergies  Allergen Reactions  . Arthrotec [Diclofenac-Misoprostol]   . Ibuprofen   . Lactose Intolerance (Gi)   . Milk-Related Compounds Nausea And Vomiting    Cheese  . Other     Grass and ragweed   . Oxycontin [Oxycodone Hcl]   . Pollen Extract     No facility-administered encounter medications on file as of 02/05/2017.    Outpatient Encounter Medications as of 02/05/2017  Medication Sig  . acetaminophen (TYLENOL) 500 MG tablet Take 500 mg by mouth 3 (three) times daily.  Marland Kitchen. albuterol (PROVENTIL HFA;VENTOLIN HFA) 108 (90 Base) MCG/ACT inhaler Inhale 2 puffs into the lungs every 6 (six) hours as needed for wheezing or shortness of breath.  . allopurinol (ZYLOPRIM) 100 MG tablet Take 100 mg by mouth daily.  . Carboxymethylcellul-Glycerin (OPTIVE) 0.5-0.9 % SOLN Apply 2 drops to eye 2 (two) times daily.  . carvedilol (COREG) 3.125 MG tablet Take 1.5625 mg by mouth 2 (two) times daily with a meal.   . cyanocobalamin 1000 MCG tablet Take 1,000 mcg by mouth daily.   Marland Kitchen. docusate (COLACE) 50 MG/5ML liquid Take 100 mg by mouth daily.  . furosemide (LASIX) 40 MG tablet Take 40 mg by mouth every other day. Alternate with 20mg  lasix  . ipratropium-albuterol (DUONEB) 0.5-2.5 (3) MG/3ML SOLN Take 3 mLs by nebulization every 6 (six) hours as needed (wheezing).   . lactose free nutrition (BOOST PLUS) LIQD Take 237 mLs daily by mouth.   . nitroGLYCERIN (NITROSTAT) 0.4 MG SL tablet Place 0.4 mg under the tongue every 5 (five) minutes as needed for  chest pain.  . polyethylene glycol (MIRALAX / GLYCOLAX) packet Take 17 g by mouth daily. Hold for loose stools  . ranitidine (ZANTAC) 150 MG tablet Take 150 mg by mouth at bedtime.  . sertraline (ZOLOFT) 25 MG tablet Take 25 mg by mouth daily.  . [DISCONTINUED] Diaper Rash Products (DESITIN) OINT Place 1 application rectally as directed. Apply after each bathroom use  . [DISCONTINUED] furosemide (LASIX) 20 MG tablet Take 20 mg by mouth every other day. Alternate with 40mg  lasix    Review of Systems  Constitutional: Positive for activity change and appetite change. Negative for chills and fever.  HENT: Negative for congestion.   Respiratory: Positive for cough, shortness of breath and wheezing. Negative for choking, chest tightness and stridor.   Cardiovascular: Positive for chest pain. Negative for palpitations and leg swelling.  Last night, not now  Gastrointestinal: Positive for abdominal pain. Negative for abdominal distention, blood in stool, constipation, diarrhea and nausea.  Genitourinary: Negative for dysuria, frequency and urgency.       Chronic incontinence  Musculoskeletal: Positive for gait problem.  Skin: Negative for color change.  Neurological: Positive for weakness. Negative for dizziness.       Weakness has not changed recently  Psychiatric/Behavioral: Positive for confusion. Negative for agitation.    Immunization History  Administered Date(s) Administered  . Influenza Inj Mdck Quad Pf 01/05/2016  . Influenza-Unspecified 12/22/2014, 01/10/2017  . Pneumococcal Conjugate-13 12/14/2013  . Pneumococcal Polysaccharide-23 03/29/2016  . Tdap 03/29/2016  . Zoster 11/03/2007   Pertinent  Health Maintenance Due  Topic Date Due  . DEXA SCAN  05/18/1987  . PNA vac Low Risk Adult  Completed   No flowsheet data found. Functional Status Survey:    Vitals:   02/05/17 1320  BP: 110/72  Pulse: 80  Resp: 16  Temp: 99 F (37.2 C)  TempSrc: Oral  SpO2: 93%    Weight: 175 lb (79.4 kg)   Body mass index is 32.01 kg/m. Physical Exam  Constitutional: She appears well-developed and well-nourished. No distress.  HENT:  Head: Normocephalic and atraumatic.  Mouth/Throat: Oropharynx is clear and moist.  Eyes: Conjunctivae and EOM are normal. Pupils are equal, round, and reactive to light.  Neck: Neck supple. No JVD present.  Abdominal: Soft. Bowel sounds are normal. She exhibits no distension. There is tenderness.  Lower quadrants bilaterally  Musculoskeletal: Normal range of motion.  Required help of caregiver in room to sit up in bed, uses wheelchair and is finally weightbearing after her hip fx  Lymphadenopathy:    She has no cervical adenopathy.  Neurological: She is alert. No cranial nerve deficit.  Pleasantly confused, oriented to self, had forgotten I was the doctor by partway through the visit  Skin: Skin is warm and dry.  Psychiatric: She has a normal mood and affect.    Labs reviewed: Recent Labs    02/08/17 1045  02/08/17 1938 02/09/17 0349 02/10/17 0318  NA  --    < > 138 138 140  K  --    < > 4.7 4.2 3.9  CL  --    < > 102 106 105  CO2  --   --  30 27 27   GLUCOSE  --    < > 132* 107* 103*  BUN  --    < > 19 20 23*  CREATININE  --    < > 0.95 0.94 1.15*  CALCIUM  --   --  8.4* 7.7* 8.2*  MG 2.0  --   --   --   --    < > = values in this interval not displayed.   Recent Labs    09/30/16 0538  01/29/17 0900 02/08/17 1938 02/09/17 0349  AST 28   < > 15 20 15   ALT 8*   < > 7 13* 11*  ALKPHOS 57   < > 113 78 64  BILITOT 1.4*  --   --  1.1 1.1  PROT 6.5  --   --  6.7 6.0*  ALBUMIN 3.6  --   --  3.2* 2.7*   < > = values in this interval not displayed.   Recent Labs    02/05/17 02/08/17 1045 02/08/17 1054 02/09/17 1622 02/10/17 0318  WBC 13.3 14.3*  --  8.9 8.4  NEUTROABS 10 11.2*  --   --  6.2  HGB 10.2* 11.1* 10.9* 9.6* 9.3*  HCT 34* 35.9* 32.0* 31.5* 28.0*  MCV  --  100.8*  --  103.6* 105.7*  PLT 163 202   --  181 182   Lab Results  Component Value Date   TSH 4.58 10/10/2016   No results found for: HGBA1C Lab Results  Component Value Date   CHOL 159 02/09/2017   HDL 49 02/09/2017   LDLCALC 96 02/09/2017   TRIG 69 02/09/2017   CHOLHDL 3.2 02/09/2017    Significant Diagnostic Results in last 30 days:  PCXR:  No acute changes, chronic interstitial findings   Assessment/Plan 1. Other chest pain -seems this was relieved with nebulizer tx suggesting more pulmonary, not cardiac (anginal) pain -has not recurred -CXR was negative for pneumonia and she's had no episodes of aspiration -cont to monitor -if recurs, obtain EKG at that time  2. Acute on chronic systolic (congestive) heart failure (HCC) -has increased edema, weight gain, shortness of breath with chest tightness relieved with neb -increase lasix to 40mg  po daily and add daily kcl to regimen -f/u bmp in 1 week  3. Delirium -on dementia -could be due to acute chf, but also has lower abdominal pain and left shift so will check UA c+s to r/o UTI, bowels moving so not constipation/impaction  4. Vascular dementia without behavioral disturbance -at baseline, is dependent in ADLs, lives in SNF with additional paid caregivers for added support since her hip fracture MMSE - Mini Mental State Exam 06/04/2012  Orientation to time 3  Orientation to Place 5  Registration 3  Attention/ Calculation 5  Attention/Calculation-comments spelled WORLD backwards  Recall 1  Language- name 2 objects 2  Language- repeat 1  Language- follow 3 step command 3  Language- read & follow direction 1  Write a sentence 1  Copy design 1  Total score 26  last was 18/30 in August of this year.    5. CKD (chronic kidney disease) stage 3, GFR 30-59 ml/min (HCC) -GFR in 50s recently -no acute change here -Avoid nephrotoxic agents like nsaids, dose adjust renally excreted meds, hydrate.  Family/ staff Communication:  Discussed with SNF  nurse  Labs/tests ordered:  Added UA c+s due to delirium on dementia and lower abdominal pain with negative CXR and leukocytosis with left shift, increase lasix indefinitely and add daily potassium and f/u bmp in 1wk  Jaylee Freeze L. Drae Mitzel, D.O. Geriatrics [facility] [address], [place] [address] Cell Phone (Mon-Fri 8am-5pm):  [phone] On Call:  [phone] & follow prompts after 5pm & weekends Office Phone:  [phone] Office Fax:  [phone]

## 2017-02-06 DIAGNOSIS — R1010 Upper abdominal pain, unspecified: Secondary | ICD-10-CM | POA: Diagnosis not present

## 2017-02-06 DIAGNOSIS — R319 Hematuria, unspecified: Secondary | ICD-10-CM | POA: Diagnosis not present

## 2017-02-06 DIAGNOSIS — Z79899 Other long term (current) drug therapy: Secondary | ICD-10-CM | POA: Diagnosis not present

## 2017-02-06 DIAGNOSIS — N390 Urinary tract infection, site not specified: Secondary | ICD-10-CM | POA: Diagnosis not present

## 2017-02-06 DIAGNOSIS — R2689 Other abnormalities of gait and mobility: Secondary | ICD-10-CM | POA: Diagnosis not present

## 2017-02-06 DIAGNOSIS — R278 Other lack of coordination: Secondary | ICD-10-CM | POA: Diagnosis not present

## 2017-02-06 DIAGNOSIS — S72001S Fracture of unspecified part of neck of right femur, sequela: Secondary | ICD-10-CM | POA: Diagnosis not present

## 2017-02-06 DIAGNOSIS — Z4789 Encounter for other orthopedic aftercare: Secondary | ICD-10-CM | POA: Diagnosis not present

## 2017-02-08 ENCOUNTER — Emergency Department (HOSPITAL_COMMUNITY): Payer: Medicare Other

## 2017-02-08 ENCOUNTER — Encounter (HOSPITAL_COMMUNITY): Payer: Self-pay | Admitting: Radiology

## 2017-02-08 ENCOUNTER — Inpatient Hospital Stay (HOSPITAL_COMMUNITY)
Admission: EM | Admit: 2017-02-08 | Discharge: 2017-02-11 | DRG: 444 | Disposition: A | Discharge status: Skilled Nursing Facility | Payer: Medicare Other | Attending: Internal Medicine | Admitting: Internal Medicine

## 2017-02-08 DIAGNOSIS — R1011 Right upper quadrant pain: Secondary | ICD-10-CM

## 2017-02-08 DIAGNOSIS — J9811 Atelectasis: Secondary | ICD-10-CM | POA: Diagnosis not present

## 2017-02-08 DIAGNOSIS — I2510 Atherosclerotic heart disease of native coronary artery without angina pectoris: Secondary | ICD-10-CM | POA: Diagnosis present

## 2017-02-08 DIAGNOSIS — Z9049 Acquired absence of other specified parts of digestive tract: Secondary | ICD-10-CM

## 2017-02-08 DIAGNOSIS — R109 Unspecified abdominal pain: Secondary | ICD-10-CM

## 2017-02-08 DIAGNOSIS — F0390 Unspecified dementia without behavioral disturbance: Secondary | ICD-10-CM | POA: Diagnosis present

## 2017-02-08 DIAGNOSIS — R509 Fever, unspecified: Secondary | ICD-10-CM | POA: Diagnosis present

## 2017-02-08 DIAGNOSIS — Z950 Presence of cardiac pacemaker: Secondary | ICD-10-CM

## 2017-02-08 DIAGNOSIS — R1010 Upper abdominal pain, unspecified: Secondary | ICD-10-CM | POA: Diagnosis not present

## 2017-02-08 DIAGNOSIS — I10 Essential (primary) hypertension: Secondary | ICD-10-CM | POA: Diagnosis not present

## 2017-02-08 DIAGNOSIS — Z955 Presence of coronary angioplasty implant and graft: Secondary | ICD-10-CM | POA: Diagnosis not present

## 2017-02-08 DIAGNOSIS — M47815 Spondylosis without myelopathy or radiculopathy, thoracolumbar region: Secondary | ICD-10-CM | POA: Diagnosis not present

## 2017-02-08 DIAGNOSIS — R4182 Altered mental status, unspecified: Secondary | ICD-10-CM | POA: Diagnosis not present

## 2017-02-08 DIAGNOSIS — A419 Sepsis, unspecified organism: Secondary | ICD-10-CM | POA: Diagnosis not present

## 2017-02-08 DIAGNOSIS — K8020 Calculus of gallbladder without cholecystitis without obstruction: Principal | ICD-10-CM | POA: Diagnosis present

## 2017-02-08 DIAGNOSIS — R0602 Shortness of breath: Secondary | ICD-10-CM | POA: Diagnosis not present

## 2017-02-08 DIAGNOSIS — Z86711 Personal history of pulmonary embolism: Secondary | ICD-10-CM | POA: Diagnosis not present

## 2017-02-08 DIAGNOSIS — I110 Hypertensive heart disease with heart failure: Secondary | ICD-10-CM | POA: Diagnosis not present

## 2017-02-08 DIAGNOSIS — I255 Ischemic cardiomyopathy: Secondary | ICD-10-CM | POA: Diagnosis present

## 2017-02-08 DIAGNOSIS — I77811 Abdominal aortic ectasia: Secondary | ICD-10-CM | POA: Diagnosis not present

## 2017-02-08 DIAGNOSIS — R0600 Dyspnea, unspecified: Secondary | ICD-10-CM | POA: Diagnosis not present

## 2017-02-08 DIAGNOSIS — Z9981 Dependence on supplemental oxygen: Secondary | ICD-10-CM

## 2017-02-08 DIAGNOSIS — I700 Atherosclerosis of aorta: Secondary | ICD-10-CM | POA: Diagnosis present

## 2017-02-08 DIAGNOSIS — E785 Hyperlipidemia, unspecified: Secondary | ICD-10-CM | POA: Diagnosis present

## 2017-02-08 DIAGNOSIS — Z91011 Allergy to milk products: Secondary | ICD-10-CM

## 2017-02-08 DIAGNOSIS — Z95810 Presence of automatic (implantable) cardiac defibrillator: Secondary | ICD-10-CM | POA: Diagnosis not present

## 2017-02-08 DIAGNOSIS — R072 Precordial pain: Secondary | ICD-10-CM | POA: Diagnosis not present

## 2017-02-08 DIAGNOSIS — R0789 Other chest pain: Secondary | ICD-10-CM | POA: Diagnosis not present

## 2017-02-08 DIAGNOSIS — Z96642 Presence of left artificial hip joint: Secondary | ICD-10-CM | POA: Diagnosis present

## 2017-02-08 DIAGNOSIS — R1030 Lower abdominal pain, unspecified: Secondary | ICD-10-CM | POA: Diagnosis not present

## 2017-02-08 DIAGNOSIS — Z886 Allergy status to analgesic agent status: Secondary | ICD-10-CM | POA: Diagnosis not present

## 2017-02-08 DIAGNOSIS — I5023 Acute on chronic systolic (congestive) heart failure: Secondary | ICD-10-CM | POA: Diagnosis present

## 2017-02-08 DIAGNOSIS — Z91048 Other nonmedicinal substance allergy status: Secondary | ICD-10-CM

## 2017-02-08 DIAGNOSIS — Z885 Allergy status to narcotic agent status: Secondary | ICD-10-CM | POA: Diagnosis not present

## 2017-02-08 DIAGNOSIS — Z888 Allergy status to other drugs, medicaments and biological substances status: Secondary | ICD-10-CM | POA: Diagnosis not present

## 2017-02-08 DIAGNOSIS — I5022 Chronic systolic (congestive) heart failure: Secondary | ICD-10-CM

## 2017-02-08 DIAGNOSIS — M109 Gout, unspecified: Secondary | ICD-10-CM | POA: Diagnosis not present

## 2017-02-08 DIAGNOSIS — Z66 Do not resuscitate: Secondary | ICD-10-CM | POA: Diagnosis present

## 2017-02-08 DIAGNOSIS — Z96612 Presence of left artificial shoulder joint: Secondary | ICD-10-CM | POA: Diagnosis present

## 2017-02-08 DIAGNOSIS — I447 Left bundle-branch block, unspecified: Secondary | ICD-10-CM | POA: Diagnosis not present

## 2017-02-08 DIAGNOSIS — R079 Chest pain, unspecified: Secondary | ICD-10-CM | POA: Diagnosis not present

## 2017-02-08 DIAGNOSIS — K449 Diaphragmatic hernia without obstruction or gangrene: Secondary | ICD-10-CM | POA: Diagnosis present

## 2017-02-08 DIAGNOSIS — M810 Age-related osteoporosis without current pathological fracture: Secondary | ICD-10-CM | POA: Diagnosis present

## 2017-02-08 DIAGNOSIS — N281 Cyst of kidney, acquired: Secondary | ICD-10-CM | POA: Diagnosis not present

## 2017-02-08 LAB — I-STAT CHEM 8, ED
BUN: 24 mg/dL — ABNORMAL HIGH (ref 6–20)
Calcium, Ion: 1.1 mmol/L — ABNORMAL LOW (ref 1.15–1.40)
Chloride: 103 mmol/L (ref 101–111)
Creatinine, Ser: 1 mg/dL (ref 0.44–1.00)
GLUCOSE: 109 mg/dL — AB (ref 65–99)
HEMATOCRIT: 32 % — AB (ref 36.0–46.0)
HEMOGLOBIN: 10.9 g/dL — AB (ref 12.0–15.0)
POTASSIUM: 4.5 mmol/L (ref 3.5–5.1)
SODIUM: 141 mmol/L (ref 135–145)
TCO2: 32 mmol/L (ref 22–32)

## 2017-02-08 LAB — BASIC METABOLIC PANEL
ANION GAP: 6 (ref 5–15)
BUN: 19 mg/dL (ref 6–20)
CHLORIDE: 102 mmol/L (ref 101–111)
CO2: 30 mmol/L (ref 22–32)
Calcium: 8.4 mg/dL — ABNORMAL LOW (ref 8.9–10.3)
Creatinine, Ser: 0.95 mg/dL (ref 0.44–1.00)
GFR calc Af Amer: 58 mL/min — ABNORMAL LOW (ref >60)
GFR calc non Af Amer: 50 mL/min — ABNORMAL LOW (ref >60)
Glucose, Bld: 132 mg/dL — ABNORMAL HIGH (ref 65–99)
Potassium: 4.7 mmol/L (ref 3.5–5.1)
Sodium: 138 mmol/L (ref 135–145)

## 2017-02-08 LAB — CBC WITH DIFFERENTIAL/PLATELET
BASOS ABS: 0 10*3/uL (ref 0.0–0.1)
BASOS PCT: 0 %
EOS ABS: 0.3 10*3/uL (ref 0.0–0.7)
Eosinophils Relative: 2 %
HCT: 35.9 % — ABNORMAL LOW (ref 36.0–46.0)
HEMOGLOBIN: 11.1 g/dL — AB (ref 12.0–15.0)
Lymphocytes Relative: 10 %
Lymphs Abs: 1.5 10*3/uL (ref 0.7–4.0)
MCH: 31.2 pg (ref 26.0–34.0)
MCHC: 30.9 g/dL (ref 30.0–36.0)
MCV: 100.8 fL — ABNORMAL HIGH (ref 78.0–100.0)
Monocytes Absolute: 1.3 10*3/uL — ABNORMAL HIGH (ref 0.1–1.0)
Monocytes Relative: 9 %
NEUTROS ABS: 11.2 10*3/uL — AB (ref 1.7–7.7)
NEUTROS PCT: 79 %
Platelets: 202 10*3/uL (ref 150–400)
RBC: 3.56 MIL/uL — AB (ref 3.87–5.11)
RDW: 15 % (ref 11.5–15.5)
WBC: 14.3 10*3/uL — ABNORMAL HIGH (ref 4.0–10.5)

## 2017-02-08 LAB — URINALYSIS, ROUTINE W REFLEX MICROSCOPIC
BILIRUBIN URINE: NEGATIVE
Glucose, UA: NEGATIVE mg/dL
Hgb urine dipstick: NEGATIVE
Ketones, ur: NEGATIVE mg/dL
Leukocytes, UA: NEGATIVE
NITRITE: NEGATIVE
PROTEIN: NEGATIVE mg/dL
pH: 5 (ref 5.0–8.0)

## 2017-02-08 LAB — HEPATIC FUNCTION PANEL
ALK PHOS: 78 U/L (ref 38–126)
ALT: 13 U/L — AB (ref 14–54)
AST: 20 U/L (ref 15–41)
Albumin: 3.2 g/dL — ABNORMAL LOW (ref 3.5–5.0)
BILIRUBIN DIRECT: 0.2 mg/dL (ref 0.1–0.5)
BILIRUBIN INDIRECT: 0.9 mg/dL (ref 0.3–0.9)
BILIRUBIN TOTAL: 1.1 mg/dL (ref 0.3–1.2)
Total Protein: 6.7 g/dL (ref 6.5–8.1)

## 2017-02-08 LAB — I-STAT TROPONIN, ED
TROPONIN I, POC: 0.02 ng/mL (ref 0.00–0.08)
TROPONIN I, POC: 0.02 ng/mL (ref 0.00–0.08)
Troponin i, poc: 0.02 ng/mL (ref 0.00–0.08)

## 2017-02-08 LAB — I-STAT CG4 LACTIC ACID, ED
LACTIC ACID, VENOUS: 1.28 mmol/L (ref 0.5–1.9)
LACTIC ACID, VENOUS: 1.15 mmol/L (ref 0.5–1.9)

## 2017-02-08 LAB — MAGNESIUM: MAGNESIUM: 2 mg/dL (ref 1.7–2.4)

## 2017-02-08 LAB — D-DIMER, QUANTITATIVE (NOT AT ARMC): D DIMER QUANT: 2.69 ug{FEU}/mL — AB (ref 0.00–0.50)

## 2017-02-08 MED ORDER — SODIUM CHLORIDE 0.9 % IV BOLUS (SEPSIS)
2000.0000 mL | Freq: Once | INTRAVENOUS | Status: AC
  Administered 2017-02-08: 2000 mL via INTRAVENOUS

## 2017-02-08 MED ORDER — CALCIUM GLUCONATE 10 % IV SOLN
1.0000 g | Freq: Once | INTRAVENOUS | Status: DC
  Filled 2017-02-08: qty 10

## 2017-02-08 MED ORDER — PIPERACILLIN-TAZOBACTAM 3.375 G IVPB 30 MIN
3.3750 g | Freq: Once | INTRAVENOUS | Status: AC
  Administered 2017-02-08: 3.375 g via INTRAVENOUS
  Filled 2017-02-08: qty 50

## 2017-02-08 MED ORDER — MORPHINE SULFATE (PF) 4 MG/ML IV SOLN
2.0000 mg | Freq: Once | INTRAVENOUS | Status: DC
  Filled 2017-02-08: qty 1

## 2017-02-08 MED ORDER — ACETAMINOPHEN 500 MG PO TABS
1000.0000 mg | ORAL_TABLET | Freq: Once | ORAL | Status: AC
  Administered 2017-02-08: 1000 mg via ORAL
  Filled 2017-02-08: qty 2

## 2017-02-08 MED ORDER — IOPAMIDOL (ISOVUE-300) INJECTION 61%
INTRAVENOUS | Status: AC
  Filled 2017-02-08: qty 100

## 2017-02-08 MED ORDER — ONDANSETRON HCL 4 MG/2ML IJ SOLN
4.0000 mg | Freq: Once | INTRAMUSCULAR | Status: AC
  Administered 2017-02-08: 4 mg via INTRAVENOUS
  Filled 2017-02-08: qty 2

## 2017-02-08 MED ORDER — FENTANYL CITRATE (PF) 100 MCG/2ML IJ SOLN
12.5000 ug | Freq: Once | INTRAMUSCULAR | Status: AC
  Administered 2017-02-08: 12.5 ug via INTRAVENOUS
  Filled 2017-02-08: qty 2

## 2017-02-08 MED ORDER — SODIUM CHLORIDE 0.9 % IV BOLUS (SEPSIS)
500.0000 mL | Freq: Once | INTRAVENOUS | Status: AC
  Administered 2017-02-08: 500 mL via INTRAVENOUS

## 2017-02-08 MED ORDER — VANCOMYCIN HCL 10 G IV SOLR
1750.0000 mg | Freq: Once | INTRAVENOUS | Status: AC
  Administered 2017-02-08: 1750 mg via INTRAVENOUS
  Filled 2017-02-08: qty 1750

## 2017-02-08 MED ORDER — IOPAMIDOL (ISOVUE-370) INJECTION 76%
INTRAVENOUS | Status: AC
  Administered 2017-02-08: 100 mL
  Filled 2017-02-08: qty 100

## 2017-02-08 MED ORDER — MAGNESIUM SULFATE 2 GM/50ML IV SOLN
2.0000 g | Freq: Once | INTRAVENOUS | Status: AC
  Administered 2017-02-08: 2 g via INTRAVENOUS
  Filled 2017-02-08: qty 50

## 2017-02-08 NOTE — ED Provider Notes (Signed)
83 yo F with a chief complaint of epigastric abdominal pain.  This been going on for the past day or so.  Worse on exertion per the patient.  The patient is baseline demented and so history from her is somewhat limited.  I received the patient in signout from Dr. Effie Shy.  Plan was for a CT scan of the chest abdomen pelvis and reassessment.  Patient continues to have significant epigastric pain.  She does have some right upper quadrant pain is now febrile.  She has a leukocytosis.  I am concerned that this may be acute cholecystitis.  Will obtain an ultrasound.  Will obtain a metabolic panel and LFTs.  Give a fluid bolus morphine and Zofran and reassess.  The patient had an episode where her blood pressure went transiently into the 70s.  She said that she felt weak with this.  Ultrasound with gallstones but no other findings of acute cholecystitis.  I discussed the case with Dr. Doylene Canard, general surgery.  She feels that this is unlikely to be acute cholecystitis causing this hypotension recommended a HIDA scan to further evaluate.  Code sepsis was called with the blood pressure.  Started on broad-spectrum antibiotics.  Given 30 cc/kg of IV fluids.  Urine is negative for infection.  Discussed with hospitalist for admission.  CRITICAL CARE Performed by: Rae Roam   Total critical care time: 35 minutes  Critical care time was exclusive of separately billable procedures and treating other patients.  Critical care was necessary to treat or prevent imminent or life-threatening deterioration.  Critical care was time spent personally by me on the following activities: development of treatment plan with patient and/or surrogate as well as nursing, discussions with consultants, evaluation of patient's response to treatment, examination of patient, obtaining history from patient or surrogate, ordering and performing treatments and interventions, ordering and review of laboratory studies, ordering and  review of radiographic studies, pulse oximetry and re-evaluation of patient's condition.    Melene Plan, DO 02/08/17 2209

## 2017-02-08 NOTE — ED Notes (Signed)
Pt c/o increasing abd pain. EDP notified.

## 2017-02-08 NOTE — ED Notes (Signed)
Nurse currently drawing labs and getting blood culture #1

## 2017-02-08 NOTE — H&P (Signed)
TRH H&P   Patient Demographics:    [name], is a 83 y.o. female  MRN: [id number]   DOB - [birth year]  Admit Date - 02/08/2017  Outpatient Primary MD for the patient is [name], DO  Referring MD/NP/PA:  Melene Plan  Outpatient Specialists:   Patient coming from: [facility]  Chief Complaint  Patient presents with  . Chest Pain      HPI:    [name]  is a 83 y.o. female, w h/o PE, hypertension, hyperlipidemia, CAD, CM, apparently presents due to sharp chest pain,  Starting this am. Substernal. No radiation.   Almost epigastric,  Also noted  abdominal pain.  Pt denies fever, prior today,  Denies palp, sob, n/v, diarrhea, brbpr, black stool, dysuria, hematuria.  Review of records apparently shows ua wbc + 20-30.    Pt has not been abx.   In ED,  EKG pending,  Trop 0.02 D dimer 2.69  Wbc 14.3, hgb 11.1, Plt 202 Magnesium 2.0 Na 141, K 4.5 Bun 24, Creatinine 1.00  CXR large hiatal hernia Chronic scarring in the right mid lung  CTA chest IMPRESSION: 1. Small bilateral pleural effusions with compressive atelectasis adjacent to a large known hiatal hernia containing much of the stomach. No active pulmonary disease. 2. Minimal thoracic aortic atherosclerosis without aneurysmal dilatation or evidence of dissection. 3. Left main and three-vessel coronary arteriosclerosis. 4. Ectasia of the abdominal aorta with patent branch vessels. Minimal atherosclerosis at the origins of the branch vessels as above described without significant luminal narrowing. 5. Thoracolumbar spondylosis with left hip and shoulder arthroplasties. Lumbar dextroscoliosis. Chronic bilateral pubic rami fractures. 6. 2.9 cm right interpolar renal cysts with mild cortical thinning of both kidneys. No obstructive uropathy. 7. Uncomplicated appearing cholelithiasis.  RUQ  ultrasound IMPRESSION: Cholelithiasis without definite evidence of cholecystitis.  ED consulted surgery who recommended HIDA, though thought that cholecystitis unlikely  Pt developed low grade temp 100.5 in the ED. Urinalysis pending,  ED requested admission for code sepsis and abdominal pain    Review of systems:    In addition to the HPI above,  No Fever-chills, No Headache, No changes with Vision or hearing, No problems swallowing food or Liquids, No Cough or Shortness of Breath,  No Blood in stool or Urine, No dysuria, No new skin rashes or bruises, No new joints pains-aches,  No new weakness, tingling, numbness in any extremity, No recent weight gain or loss, No polyuria, polydypsia or polyphagia, No significant Mental Stressors.  A full 10 point Review of Systems was done, except as stated above, all other Review of Systems were negative.   With Past History of the following :    Past Medical History:  Diagnosis Date  . CAD (coronary artery disease)   . Chronic low back pain   . Contusion of foot, right 05/15/12  . Diverticulitis   .  Dyslipidemia   . H/O: hysterectomy   . HTN (hypertension)   . Memory disturbance 05/2012   MMSE 26/30, intact Clock test  . Nonischemic cardiomyopathy (HCC)   . Osteoporosis   . Pulmonary embolism (HCC)   . Right knee pain 05/15/12  . Spinal stenosis   . Ulcer disease   . Weight loss       Past Surgical History:  Procedure Laterality Date  . APPENDECTOMY  2010  . BACK SURGERY    . BIV ICD GENERTAOR CHANGE OUT N/A 05/24/2011   Procedure: BIV ICD GENERTAOR CHANGE OUT;  Surgeon: Marinus MawGregg W Taylor, MD;  [facility];  Service: Cardiovascular;  Laterality: N/A;  . CATARACT EXTRACTION    . COLON RESECTION  2005  . CORONARY ANGIOPLASTY WITH STENT PLACEMENT  01/2003  . HEMICOLECTOMY    . HIP ARTHROPLASTY Left 04/05/2014   Procedure: ARTHROPLASTY BIPOLAR HIP;  Surgeon: Shelda PalMatthew D Olin, MD;  Location: [facility] ORS;  Service:  Orthopedics;  Laterality: Left;  . left shoulder replacement  1991   Dr. Fannie KneeSue  . ORIF HIP FRACTURE Left 09/30/2016   Procedure: OPEN REDUCTION INTERNAL FIXATION PERIPROSTHETIC HIP FRACTURE;  Surgeon: Ollen GrossAluisio, Frank, MD;  Location: [facility] ORS;  Service: Orthopedics;  Laterality: Left;  . right shoulder repair  1985   Dr. Fannie KneeSue  . rotator cuff debridement  1999   Dr. Despina HickAlusio      Social History:     Social History   Tobacco Use  . Smoking status: Never Smoker  . Smokeless tobacco: Never Used  Substance Use Topics  . Alcohol use: No     [facility]  Mobility - walks by self   Family History :     Family History  Problem Relation Age of Onset  . Coronary artery disease Sister   . Heart attack Father [id number]  . Heart attack Mother   . Other Mother        abdominal blockage      Home Medications:   Prior to Admission medications   Medication Sig Start Date End Date Taking? Authorizing Provider  acetaminophen (TYLENOL) 325 MG tablet Take 650 mg by mouth every 6 (six) hours as needed for mild pain.   Yes [provider]  acetaminophen (TYLENOL) 500 MG tablet Take 500 mg by mouth 3 (three) times daily.   Yes [provider]  albuterol (PROVENTIL HFA;VENTOLIN HFA) 108 (90 Base) MCG/ACT inhaler Inhale 2 puffs into the lungs every 6 (six) hours as needed for wheezing or shortness of breath.   Yes [provider]  allopurinol (ZYLOPRIM) 100 MG tablet Take 100 mg by mouth daily.   Yes [provider]  Carboxymethylcellul-Glycerin (OPTIVE) 0.5-0.9 % SOLN Apply 2 drops to eye 2 (two) times daily.   Yes [provider]  carvedilol (COREG) 3.125 MG tablet Take 1.5625 mg by mouth 2 (two) times daily with a meal.    Yes [provider]  cyanocobalamin 1000 MCG tablet Take 1,000 mcg by mouth daily.    Yes [provider]  docusate (COLACE) 50 MG/5ML liquid Take 100 mg by mouth daily.   Yes [provider]   furosemide (LASIX) 40 MG tablet Take 40 mg by mouth every other day. Alternate with 20mg  lasix   Yes [provider]  ipratropium-albuterol (DUONEB) 0.5-2.5 (3) MG/3ML SOLN Take 3 mLs by nebulization every 6 (six) hours as needed (wheezing).    Yes [provider]  lactose free nutrition (  BOOST PLUS) LIQD Take 237 mLs daily by mouth.    Yes [provider]  Morphine Sulfate (MORPHINE CONCENTRATE) 10 mg / 0.5 ml concentrated solution Take 5 mg by mouth once.   Yes [provider]  nitroGLYCERIN (NITROSTAT) 0.4 MG SL tablet Place 0.4 mg under the tongue every 5 (five) minutes as needed for chest pain.   Yes [provider]  omeprazole (PRILOSEC OTC) 20 MG tablet Take 20 mg by mouth 2 (two) times daily.   Yes [provider]  polyethylene glycol (MIRALAX / GLYCOLAX) packet Take 17 g by mouth daily. Hold for loose stools   Yes [provider]  potassium chloride SA (K-DUR,KLOR-CON) 20 MEQ tablet Take 20 mEq by mouth daily.   Yes [provider]  ranitidine (ZANTAC) 150 MG tablet Take 150 mg by mouth at bedtime.   Yes [provider]  sertraline (ZOLOFT) 25 MG tablet Take 25 mg by mouth daily.   Yes [provider]     Allergies:     Allergies  Allergen Reactions  . Arthrotec [Diclofenac-Misoprostol]   . Ibuprofen   . Lactose Intolerance (Gi)   . Milk-Related Compounds Nausea And Vomiting    Cheese  . Other     Grass and ragweed   . Oxycontin [Oxycodone Hcl]   . Pollen Extract      Physical Exam:   Vitals  Blood pressure (!) 98/50, pulse 85, temperature (!) 100.5 F (38.1 C), temperature source Oral, resp. rate 17, SpO2 97 %.   1. General  lying in bed in NAD,    2. Normal affect and insight, Not Suicidal or Homicidal, Awake Alert, Oriented X 3.  3. No F.N deficits, ALL C.Nerves Intact, Strength 5/5 all 4 extremities, Sensation intact all 4 extremities, Plantars down going.  4. Ears and  Eyes appear Normal, Conjunctivae clear, PERRLA. Moist Oral Mucosa.  5. Supple Neck, No JVD, No cervical lymphadenopathy appriciated, No Carotid Bruits.  6. Symmetrical Chest wall movement, Good air movement bilaterally, CTAB.  7. RRR, No Gallops, Rubs or Murmurs, No Parasternal Heave.  8. Positive Bowel Sounds, Abdomen Soft, No tenderness, No organomegaly appriciated,No rebound -guarding or rigidity.  9.  No Cyanosis, Normal Skin Turgor, No Skin Rash or Bruise.  10. Good muscle tone,  joints appear normal , no effusions, Normal ROM.  11. No Palpable Lymph Nodes in Neck or Axillae     Data Review:    CBC Recent Labs  Lab 02/08/17 1045 02/08/17 1054  WBC 14.3*  --   HGB 11.1* 10.9*  HCT 35.9* 32.0*  PLT 202  --   MCV 100.8*  --   MCH 31.2  --   MCHC 30.9  --   RDW 15.0  --   LYMPHSABS 1.5  --   MONOABS 1.3*  --   EOSABS 0.3  --   BASOSABS 0.0  --    ------------------------------------------------------------------------------------------------------------------  Chemistries  Recent Labs  Lab 02/08/17 1045 02/08/17 1054 02/08/17 1938  NA  --  141 138  K  --  4.5 4.7  CL  --  103 102  CO2  --   --  30  GLUCOSE  --  109* 132*  BUN  --  24* 19  CREATININE  --  1.00 0.95  CALCIUM  --   --  8.4*  MG 2.0  --   --   AST  --   --  20  ALT  --   --  13*  ALKPHOS  --   --  78  BILITOT  --   --  1.1   ------------------------------------------------------------------------------------------------------------------ estimated creatinine clearance is 35.3 mL/min (by C-G formula based on SCr of 0.95 mg/dL). ------------------------------------------------------------------------------------------------------------------ No results for input(s): TSH, T4TOTAL, T3FREE, THYROIDAB in the last 72 hours.  Invalid input(s): FREET3  Coagulation profile No results for input(s): INR, PROTIME in the last 168  hours. ------------------------------------------------------------------------------------------------------------------- Recent Labs    02/08/17 1530  DDIMER 2.69*   -------------------------------------------------------------------------------------------------------------------  Cardiac Enzymes No results for input(s): CKMB, TROPONINI, MYOGLOBIN in the last 168 hours.  Invalid input(s): CK ------------------------------------------------------------------------------------------------------------------ No results found for: BNP   ---------------------------------------------------------------------------------------------------------------  Urinalysis    Component Value Date/Time   COLORURINE YELLOW 02/08/2017 2152   APPEARANCEUR CLEAR 02/08/2017 2152   LABSPEC >1.046 (H) 02/08/2017 2152   PHURINE 5.0 02/08/2017 2152   GLUCOSEU NEGATIVE 02/08/2017 2152   HGBUR NEGATIVE 02/08/2017 2152   BILIRUBINUR NEGATIVE 02/08/2017 2152   KETONESUR NEGATIVE 02/08/2017 2152   PROTEINUR NEGATIVE 02/08/2017 2152   UROBILINOGEN 0.2 04/04/2014 2228   NITRITE NEGATIVE 02/08/2017 2152   LEUKOCYTESUR NEGATIVE 02/08/2017 2152    ----------------------------------------------------------------------------------------------------------------   Imaging Results:    Dg Chest Port 1 View  Result Date: 02/08/2017 CLINICAL DATA:  Chest pain and shortness of Breath EXAM: PORTABLE CHEST 1 VIEW COMPARISON:  04/04/2014 FINDINGS: Cardiac shadow is mildly enlarged. Large hiatal hernia is seen. Defibrillator is again noted and stable. The lungs are well aerated bilaterally. Persistent thickening of the minor fissure on the right is noted consistent with scarring. No focal infiltrate is seen. IMPRESSION: Large hiatal hernia. Chronic scarring in the right mid lung. Electronically Signed   By: Alcide Clever M.D.   On: 02/08/2017 12:04   Ct Angio Chest/abd/pel For Dissection W And/or Wo Contrast  Result  Date: 02/08/2017 CLINICAL DATA:  Dyspnea x3 days. EXAM: CT ANGIOGRAPHY CHEST, ABDOMEN AND PELVIS TECHNIQUE: Multidetector CT imaging through the chest, abdomen and pelvis was performed using the standard protocol during bolus administration of intravenous contrast. Multiplanar reconstructed images and MIPs were obtained and reviewed to evaluate the vascular anatomy. CONTRAST:  ISOVUE-370 IOPAMIDOL (ISOVUE-370) INJECTION 76% COMPARISON:  Same day CXR.  Chest CT from 02/06/2011. FINDINGS: CTA CHEST FINDINGS Cardiovascular: Stable cardiomegaly with ICD device along the left anterior chest wall and leads in the right atrium coronary sinus and right ventricle. No pericardial effusion. Left main and three-vessel coronary arteriosclerosis. No large central pulmonary embolus. Mediastinum/Nodes: Mild atherosclerosis of the thoracic aorta at the arch and along the descending portion. No aneurysm or dissection. No mediastinal adenopathy. Patent trachea and mainstem bronchi. Large hiatal hernia. Lungs/Pleura: Small bilateral pleural effusions adjacent to the large hiatal hernia. Areas of compressive atelectasis are noted at each lung base with dependent atelectasis along the posterior aspect of both lower lobes. Respiratory motion artifacts limit assessment. Musculoskeletal: Thoracic spondylosis with kyphosis attributable to multilevel degenerative disc disease. No acute nor suspicious osseous abnormality. Left shoulder arthroplasty. Chronic posterior left lower rib fractures. Review of the MIP images confirms the above findings. CTA ABDOMEN AND PELVIS FINDINGS VASCULAR Aorta: No aneurysm or dissection. Moderate atherosclerosis. No significant stenosis. Celiac: Minimal atherosclerosis at the origin. The celiac axis and branch vessels are patent without significant stenosis, aneurysm or dissection. SMA: Atherosclerosis at the origin of the SMA without significant stenosis. No occlusion, dissection or aneurysm. Renals:  Atherosclerosis of the origins of both renal arteries without significant luminal narrowing, fibromuscular dysplasia, aneurysm or dissection. IMA: Patent. Inflow: Patent without evidence  of aneurysm, dissection, vasculitis or significant stenosis. Mild atherosclerosis of the right common and both internal iliac arteries. Veins: No obvious venous abnormality within the limitations of this arterial phase study. Review of the MIP images confirms the above findings. NON-VASCULAR Hepatobiliary: No focal liver abnormality is seen. Tiny layering calculi within the physiologically distended gallbladder. No mural thickening or pericholecystic fluid. Pancreas: Normal without ductal dilatation or enhancing mass. Spleen: No splenomegaly or mass. Adrenals/Urinary Tract: Bilateral renal cortical thinning with right interpolar renal cyst measuring 2.9 cm. Mild thickening of the adrenal glands without focal mass. No nephrolithiasis nor hydroureteronephrosis. The urinary bladder is partially obscured by the patient's hip arthroplasty on the left. Stomach/Bowel: Large hiatal hernia as previously mentioned. No bowel obstruction or inflammation. Partial colectomy. Lymphatic: No lymphadenopathy. Reproductive: The lower pelvis is obscured by the patient's left hip arthroplasty. The uterus is either atrophic or surgically absent. No adnexal mass. Other: No free air free fluid. Musculoskeletal: Old posttraumatic deformity of the pubic rami with healing. Dextroscoliosis of the upper thoracic spine. Osteoarthritis of the native right hip. Lumbar spondylosis. Review of the MIP images confirms the above findings. IMPRESSION: 1. Small bilateral pleural effusions with compressive atelectasis adjacent to a large known hiatal hernia containing much of the stomach. No active pulmonary disease. 2. Minimal thoracic aortic atherosclerosis without aneurysmal dilatation or evidence of dissection. 3. Left main and three-vessel coronary arteriosclerosis.  4. Ectasia of the abdominal aorta with patent branch vessels. Minimal atherosclerosis at the origins of the branch vessels as above described without significant luminal narrowing. 5. Thoracolumbar spondylosis with left hip and shoulder arthroplasties. Lumbar dextroscoliosis. Chronic bilateral pubic rami fractures. 6. 2.9 cm right interpolar renal cysts with mild cortical thinning of both kidneys. No obstructive uropathy. 7. Uncomplicated appearing cholelithiasis. Electronically Signed   By: Tollie Eth M.D.   On: 02/08/2017 18:02   US Abdomen Limited Ruq  Result Date: 02/08/2017 CLINICAL DATA:  Right upper quadrant abdominal pain. EXAM: ULTRASOUND ABDOMEN LIMITED RIGHT UPPER QUADRANT COMPARISON:  Ultrasound of May 04, 2015. FINDINGS: Gallbladder: 6 mm gallstone is noted. No gallbladder wall thickening or pericholecystic fluid is noted. No sonographic Murphy's sign is noted. Common bile duct: Diameter: 5 mm which is within normal limits. Liver: 8 mm cyst is noted in left hepatic lobe. Within normal limits in parenchymal echogenicity. Portal vein is patent on color Doppler imaging with normal direction of blood flow towards the liver. IMPRESSION: Cholelithiasis without definite evidence of cholecystitis. Small left hepatic cyst. Electronically Signed   By: Lupita Raider, M.D.   On: 02/08/2017 20:43       Assessment & Plan:    Principal Problem:   Fever Active Problems:   Sepsis (HCC)    Fever ? Secondary to uti / sepsis Await urine culture Start vanco iv pharmacy to dose , zosyn iv pharmacy   Sepsis Blood culture x2 Urinalysis/ urine culture pending Please obtain culture data from [employer] abx as above  CP Tele Trop I q6h x3 Check cardiac echo Cardiology consult requested by email  Defer to cardiology regarding further cardiac w/up  Abdominal pain NPO  Ns iv HIDA Surgery consulted by ED, appreciate input  Gerd protonix 40mg  po bid  Gout Cont  allopurinol  CAD Cont carvedilol Start aspirin 325mg  po qday Lipitor 80mg  po x1 Check lipid,      DVT Prophylaxis  SCDs   AM Labs Ordered, also please review Full Orders  Family Communication: Admission, patients condition and plan of  care including tests being ordered have been discussed with the patient  who indicate understanding and agree with the plan and Code Status.  Code Status FULL CODE  Likely DC to  home  Condition GUARDED    Consults called: cardiology by email. Surgery consult by ED,   Admission status: inpatient  Time spent in minutes : 45   Pearson Grippe M.D on 02/08/2017 at 10:11 PM  Between 7pm to 7am - Pager - [phone]  . After 7am go to [email or web address] - password [facility]  [facility] - Office  [phone]

## 2017-02-08 NOTE — Consult Note (Signed)
Surgical Consultation Requesting provider: Dr. Adela Lank  CC: chest pain  HPI: This is a 83 year old woman who came to the emergency room earlier today complaining of chest pain. History is taken from chart review and from speaking to her daughter at the bedside as the patient has dementia and is a very poor historian. The pain started this morning around 7 AM and waxes and wanes. The patient describes it as sharp and located under the breast bone. No associated nausea or emesis. No fevers at home. She had a similar episode of pain on Tuesday but it resolved on its own. She was given nitroglycerin at her facility and again during transport by EMS when she also received aspirin. She is undergone a cardiac workup including troponins, EKG, d-dimer, CT angiogram of the chest abdomen and pelvis, none revealing and acute source of pain. Her d-dimer is elevated and her white blood cell count is 14; she does have small bilateral pleural effusions with compressive atelectasis, and known large hiatal hernia containing which of the stomach, but no other acute findings on CTA.   On further evaluation here in the ER, it seems that her pain is actually upper abdominal and therefore an ultrasound of the gallbladder was ordered to further evaluate cholelithiasis seen on the CT. This demonstrates a 6 mm gallstone but no signs of cholecystitis. Common bile duct is normal at 5 mm. Her LFTs are normal.  She dropped her blood pressure momentarily while returning from ultrasound to systolic of the 70s but this has improved to the high 90s without specific intervention. Blood cultures and urine cultures are being collected currently, per her daughter she does have a history of really horrible UTIs.  She has a history of nonischemic cardiomyopathy with an EF less than 35%. She has an ICD in place. She is oxygen dependent at baseline. She is a DO NOT RESUSCITATE.   Her surgical history is notable for sigmoid colectomy and small  bowel resection for diverticulitis by Dr. Lindie Spruce in 2005 as well as laparoscopic appendectomy by Dr. Freida Busman in 2010 , multiple orthopedic procedures.   Her husband, now deceased, was the head of anesthesiology at this hospital many years ago.     Allergies  Allergen Reactions  . Arthrotec [Diclofenac-Misoprostol]   . Ibuprofen   . Lactose Intolerance (Gi)   . Milk-Related Compounds Nausea And Vomiting    Cheese  . Other     Grass and ragweed   . Oxycontin [Oxycodone Hcl]   . Pollen Extract     Past Medical History:  Diagnosis Date  . CAD (coronary artery disease)   . Chronic low back pain   . Contusion of foot, right 05/15/12  . Diverticulitis   . Dyslipidemia   . H/O: hysterectomy   . HTN (hypertension)   . Memory disturbance 05/2012   MMSE 26/30, intact Clock test  . Nonischemic cardiomyopathy (HCC)   . Osteoporosis   . Pulmonary embolism (HCC)   . Right knee pain 05/15/12  . Spinal stenosis   . Ulcer disease   . Weight loss     Past Surgical History:  Procedure Laterality Date  . APPENDECTOMY  2010  . BACK SURGERY    . BIV ICD GENERTAOR CHANGE OUT N/A 05/24/2011   Procedure: BIV ICD GENERTAOR CHANGE OUT;  Surgeon: [name], MD;  Location: [facility] CATH LAB;  Service: Cardiovascular;  Laterality: N/A;  . CATARACT EXTRACTION    . COLON RESECTION  2005  . CORONARY ANGIOPLASTY  WITH STENT PLACEMENT  01/2003  . HEMICOLECTOMY    . HIP ARTHROPLASTY Left 04/05/2014   Procedure: ARTHROPLASTY BIPOLAR HIP;  Surgeon: Shelda Pal, MD;  Location: [facility] OR[facility];  Service: Orthopedics;  Laterality: Left;  . left shoulder replacement  1991   Dr. [name]  . ORIF HIP FRACTURE Left 09/30/2016   Procedure: OPEN REDUCTION INTERNAL FIXATION PERIPROSTHETIC HIP FRACTURE;  Surgeon: Ollen Gross, MD;  Location: [facility];  Service: Orthopedics;  Laterality: Left;  . right shoulder repair  1985   Dr. [name]  . rotator cuff debridement  1999   Dr. [name]    Family History  Problem Relation Age of  Onset  . Coronary artery disease Sister   . Heart attack Father 39  . Heart attack Mother   . Other Mother        abdominal blockage    Social History   Socioeconomic History  . Marital status: Widowed    Spouse name: None  . Number of children: None  . Years of education: None  . Highest education level: None  Social Needs  . Financial resource strain: None  . Food insecurity - worry: None  . Food insecurity - inability: None  . Transportation needs - medical: None  . Transportation needs - non-medical: None  Occupational History  . None  Tobacco Use  . Smoking status: Never Smoker  . Smokeless tobacco: Never Used  Substance and Sexual Activity  . Alcohol use: No  . Drug use: No  . Sexual activity: None  Other Topics Concern  . None  Social History Narrative  . None    No current facility-administered medications on file prior to encounter.    Current Outpatient Medications on File Prior to Encounter  Medication Sig Dispense Refill  . acetaminophen (TYLENOL) 325 MG tablet Take 650 mg by mouth every 6 (six) hours as needed for mild pain.    Marland Kitchen acetaminophen (TYLENOL) 500 MG tablet Take 500 mg by mouth 3 (three) times daily.    Marland Kitchen albuterol (PROVENTIL HFA;VENTOLIN HFA) 108 (90 Base) MCG/ACT inhaler Inhale 2 puffs into the lungs every 6 (six) hours as needed for wheezing or shortness of breath.    . allopurinol (ZYLOPRIM) 100 MG tablet Take 100 mg by mouth daily.    . Carboxymethylcellul-Glycerin (OPTIVE) 0.5-0.9 % SOLN Apply 2 drops to eye 2 (two) times daily.    . carvedilol (COREG) 3.125 MG tablet Take 1.5625 mg by mouth 2 (two) times daily with a meal.     . cyanocobalamin 1000 MCG tablet Take 1,000 mcg by mouth daily.     Marland Kitchen docusate (COLACE) 50 MG/5ML liquid Take 100 mg by mouth daily.    . furosemide (LASIX) 40 MG tablet Take 40 mg by mouth every other day. Alternate with 20mg  lasix    . ipratropium-albuterol (DUONEB) 0.5-2.5 (3) MG/3ML SOLN Take 3 mLs by  nebulization every 6 (six) hours as needed (wheezing).     . lactose free nutrition (BOOST PLUS) LIQD Take 237 mLs daily by mouth.     . Morphine Sulfate (MORPHINE CONCENTRATE) 10 mg / 0.5 ml concentrated solution Take 5 mg by mouth once.    . nitroGLYCERIN (NITROSTAT) 0.4 MG SL tablet Place 0.4 mg under the tongue every 5 (five) minutes as needed for chest pain.    Marland Kitchen omeprazole (PRILOSEC OTC) 20 MG tablet Take 20 mg by mouth 2 (two) times daily.    . polyethylene glycol (MIRALAX / GLYCOLAX) packet Take 17 g  by mouth daily. Hold for loose stools    . potassium chloride SA (K-DUR,KLOR-CON) 20 MEQ tablet Take 20 mEq by mouth daily.    . ranitidine (ZANTAC) 150 MG tablet Take 150 mg by mouth at bedtime.    . sertraline (ZOLOFT) 25 MG tablet Take 25 mg by mouth daily.      Review of Systems: a complete, 10pt review of systems was unable to be completed due to patient mental status  Physical Exam: Vitals:   02/08/17 1930 02/08/17 2000  BP: 98/62 (!) 99/56  Pulse: 69 72  Resp: 20 16  Temp:    SpO2: 97% 97%   Gen: Alert, cooperative, no distress Head: normocephalic, atraumatic, extraocular motions intact, anicteric.  Neck: supple without mass or thyromegaly, trachea midline Chest: unlabored respirations, symmetrical air entry clear bilaterally. No chest wall tenderness  Cardiovascular: RRR with palpable distal pulses, mild nonpitting bilateral lower extremity edema Abdomen: soft, nondistended, mildly tender along the entire right hemiabdomen and epigastric field. There is no guarding or rebound. No mass or organomegaly.  Extremities: warm,  no gross deformities  Neuro: grossly intact, follows commands, pleasantly demented Psych: appropriate mood and affect, limited insight Skin: warm and dry   CBC Latest Ref Rng & Units 02/08/2017 02/08/2017 01/29/2017  WBC 4.0 - 10.5 K/uL - 14.3(H) 8.3  Hemoglobin 12.0 - 15.0 g/dL 10.9(L) 11.1(L) 11.6(A)  Hematocrit 36.0 - 46.0 % 32.0(L) 35.9(L) 36   Platelets 150 - 400 K/uL - 202 200    CMP Latest Ref Rng & Units 02/08/2017 02/08/2017 01/29/2017  Glucose 65 - 99 mg/dL 161(W) 960(A) -  BUN 6 - 20 mg/dL 19 54(U) 17  Creatinine 0.44 - 1.00 mg/dL 9.81 1.91 0.8  Sodium 478 - 145 mmol/L 138 141 144  Potassium 3.5 - 5.1 mmol/L 4.7 4.5 4.4  Chloride 101 - 111 mmol/L 102 103 -  CO2 22 - 32 mmol/L 30 - -  Calcium 8.9 - 10.3 mg/dL 2.9(F) - -  Total Protein 6.5 - 8.1 g/dL 6.7 - -  Total Bilirubin 0.3 - 1.2 mg/dL 1.1 - -  Alkaline Phos 38 - 126 U/L 78 - 113  AST 15 - 41 U/L 20 - 15  ALT 14 - 54 U/L 13(L) - 7    Lab Results  Component Value Date   INR 0.97 05/24/2011   INR 0.91 01/06/2011   INR 0.94 09/13/2010    Imaging: Dg Chest Port 1 View  Result Date: 02/08/2017 CLINICAL DATA:  Chest pain and shortness of Breath EXAM: PORTABLE CHEST 1 VIEW COMPARISON:  04/04/2014 FINDINGS: Cardiac shadow is mildly enlarged. Large hiatal hernia is seen. Defibrillator is again noted and stable. The lungs are well aerated bilaterally. Persistent thickening of the minor fissure on the right is noted consistent with scarring. No focal infiltrate is seen. IMPRESSION: Large hiatal hernia. Chronic scarring in the right mid lung. Electronically Signed   By: Alcide Clever M.D.   On: 02/08/2017 12:04   Ct Angio Chest/abd/pel For Dissection W And/or Wo Contrast  Result Date: 02/08/2017 CLINICAL DATA:  Dyspnea x3 days. EXAM: CT ANGIOGRAPHY CHEST, ABDOMEN AND PELVIS TECHNIQUE: Multidetector CT imaging through the chest, abdomen and pelvis was performed using the standard protocol during bolus administration of intravenous contrast. Multiplanar reconstructed images and MIPs were obtained and reviewed to evaluate the vascular anatomy. CONTRAST:  ISOVUE-370 IOPAMIDOL (ISOVUE-370) INJECTION 76% COMPARISON:  Same day CXR.  Chest CT from 02/06/2011. FINDINGS: CTA CHEST FINDINGS Cardiovascular: Stable cardiomegaly with ICD device  along the left anterior chest wall  and leads in the right atrium coronary sinus and right ventricle. No pericardial effusion. Left main and three-vessel coronary arteriosclerosis. No large central pulmonary embolus. Mediastinum/Nodes: Mild atherosclerosis of the thoracic aorta at the arch and along the descending portion. No aneurysm or dissection. No mediastinal adenopathy. Patent trachea and mainstem bronchi. Large hiatal hernia. Lungs/Pleura: Small bilateral pleural effusions adjacent to the large hiatal hernia. Areas of compressive atelectasis are noted at each lung base with dependent atelectasis along the posterior aspect of both lower lobes. Respiratory motion artifacts limit assessment. Musculoskeletal: Thoracic spondylosis with kyphosis attributable to multilevel degenerative disc disease. No acute nor suspicious osseous abnormality. Left shoulder arthroplasty. Chronic posterior left lower rib fractures. Review of the MIP images confirms the above findings. CTA ABDOMEN AND PELVIS FINDINGS VASCULAR Aorta: No aneurysm or dissection. Moderate atherosclerosis. No significant stenosis. Celiac: Minimal atherosclerosis at the origin. The celiac axis and branch vessels are patent without significant stenosis, aneurysm or dissection. SMA: Atherosclerosis at the origin of the SMA without significant stenosis. No occlusion, dissection or aneurysm. Renals: Atherosclerosis of the origins of both renal arteries without significant luminal narrowing, fibromuscular dysplasia, aneurysm or dissection. IMA: Patent. Inflow: Patent without evidence of aneurysm, dissection, vasculitis or significant stenosis. Mild atherosclerosis of the right common and both internal iliac arteries. Veins: No obvious venous abnormality within the limitations of this arterial phase study. Review of the MIP images confirms the above findings. NON-VASCULAR Hepatobiliary: No focal liver abnormality is seen. Tiny layering calculi within the physiologically distended gallbladder. No  mural thickening or pericholecystic fluid. Pancreas: Normal without ductal dilatation or enhancing mass. Spleen: No splenomegaly or mass. Adrenals/Urinary Tract: Bilateral renal cortical thinning with right interpolar renal cyst measuring 2.9 cm. Mild thickening of the adrenal glands without focal mass. No nephrolithiasis nor hydroureteronephrosis. The urinary bladder is partially obscured by the patient's hip arthroplasty on the left. Stomach/Bowel: Large hiatal hernia as previously mentioned. No bowel obstruction or inflammation. Partial colectomy. Lymphatic: No lymphadenopathy. Reproductive: The lower pelvis is obscured by the patient's left hip arthroplasty. The uterus is either atrophic or surgically absent. No adnexal mass. Other: No free air free fluid. Musculoskeletal: Old posttraumatic deformity of the pubic rami with healing. Dextroscoliosis of the upper thoracic spine. Osteoarthritis of the native right hip. Lumbar spondylosis. Review of the MIP images confirms the above findings. IMPRESSION: 1. Small bilateral pleural effusions with compressive atelectasis adjacent to a large known hiatal hernia containing much of the stomach. No active pulmonary disease. 2. Minimal thoracic aortic atherosclerosis without aneurysmal dilatation or evidence of dissection. 3. Left main and three-vessel coronary arteriosclerosis. 4. Ectasia of the abdominal aorta with patent branch vessels. Minimal atherosclerosis at the origins of the branch vessels as above described without significant luminal narrowing. 5. Thoracolumbar spondylosis with left hip and shoulder arthroplasties. Lumbar dextroscoliosis. Chronic bilateral pubic rami fractures. 6. 2.9 cm right interpolar renal cysts with mild cortical thinning of both kidneys. No obstructive uropathy. 7. Uncomplicated appearing cholelithiasis. Electronically Signed   By: Tollie Eth M.D.   On: 02/08/2017 18:02   US Abdomen Limited Ruq  Result Date: 02/08/2017 CLINICAL DATA:   Right upper quadrant abdominal pain. EXAM: ULTRASOUND ABDOMEN LIMITED RIGHT UPPER QUADRANT COMPARISON:  Ultrasound of May 04, 2015. FINDINGS: Gallbladder: 6 mm gallstone is noted. No gallbladder wall thickening or pericholecystic fluid is noted. No sonographic Murphy's sign is noted. Common bile duct: Diameter: 5 mm which is within normal limits. Liver: 8 mm cyst is noted in  left hepatic lobe. Within normal limits in parenchymal echogenicity. Portal vein is patent on color Doppler imaging with normal direction of blood flow towards the liver. IMPRESSION: Cholelithiasis without definite evidence of cholecystitis. Small left hepatic cyst. Electronically Signed   By: Lupita RaiderJames  Green Jr, M.D.   On: 02/08/2017 20:9243    A/P: 83 year old frail woman with multiple comorbidities and one-day history of upper abdominal/chest pain. While she does have cholelithiasis and elevated white blood cell count, there are no other qualities of her ultrasound suggestive of cholecystitis. Would recommend HIDA scan to rule in or out cholecystitis. If HIDA positive, she would most likely best be candidate for percutaneous cholecystostomy tube. Will discuss with her daughter in a.m. pending results of HIDA. For tonight would recommend admission for gentle fluid resuscitation in the setting of her depressed EF, empiric IV antibiotics, and symptom control.   Gen. surgery will continue to follow, thank you for involving us in this nice family's care.    Phylliss Blakeshelsea Lillan Mccreadie, MD [facility] [place] Pager [phone]

## 2017-02-08 NOTE — ED Provider Notes (Signed)
[name] EMERGENCY DEPARTMENT Provider Note   CSN: [id number] Arrival date & time: 02/08/17  8768     History   Chief Complaint Chief Complaint  Patient presents with  . Chest Pain    HPI [name] is a 83 y.o. female.  The patient presents for evaluation of chest pain which is stated to have started this morning around 7 AM.  The patient is unable to give history secondary to dementia.  She is here, with her daughter, who helps give history.  She apparently had nitroglycerin at her facility, and during transport EMS gave her nitroglycerin and aspirin.  She is DNR.  She has a pacemaker device.  Level 5 caveat  HPI  Past Medical History:  Diagnosis Date  . CAD (coronary artery disease)   . Chronic low back pain   . Contusion of foot, right 05/15/12  . Diverticulitis   . Dyslipidemia   . H/O: hysterectomy   . HTN (hypertension)   . Memory disturbance 05/2012   MMSE 26/30, intact Clock test  . Nonischemic cardiomyopathy (HCC)   . Osteoporosis   . Pulmonary embolism (HCC)   . Right knee pain 05/15/12  . Spinal stenosis   . Ulcer disease   . Weight loss     Patient Active Problem List   Diagnosis Date Noted  . Dysphagia 12/11/2016  . CKD (chronic kidney disease) stage 3, GFR 30-59 ml/min (HCC) 12/11/2016  . Hypoxia 10/25/2016  . Dementia 10/16/2016  . Sick sinus syndrome (HCC) 10/16/2016  . Nonischemic cardiomyopathy (HCC) 10/16/2016  . Immobility 10/16/2016  . Periprosthetic supracondylar fracture of femur 09/30/2016  . Osteoarthritis 05/10/2014  . Gout 05/10/2014  . Anemia 04/19/2014  . Constipation 04/19/2014  . Hip fracture (HCC) 04/04/2014  . Right knee pain   . Contusion of foot, right 05/15/2012  . Palpitations 01/09/2011  . Pacemaker 10/10/2010  . Chronic systolic heart failure (HCC) 10/10/2010  . Dyslipidemia 10/10/2010    Past Surgical History:  Procedure Laterality Date  . APPENDECTOMY  2010  . BACK SURGERY    . BIV  ICD GENERTAOR CHANGE OUT N/A 05/24/2011   Procedure: BIV ICD GENERTAOR CHANGE OUT;  Surgeon: [name], MD;  Location: [facility] CATH LAB;  Service: Cardiovascular;  Laterality: N/A;  . CATARACT EXTRACTION    . COLON RESECTION  2005  . CORONARY ANGIOPLASTY WITH STENT PLACEMENT  01/2003  . HEMICOLECTOMY    . HIP ARTHROPLASTY Left 04/05/2014   Procedure: ARTHROPLASTY BIPOLAR HIP;  Surgeon: Shelda Pal, MD;  Location: [facility] ORS;  Service: Orthopedics;  Laterality: Left;  . left shoulder replacement  1991   Dr. [name]  . ORIF HIP FRACTURE Left 09/30/2016   Procedure: OPEN REDUCTION INTERNAL FIXATION PERIPROSTHETIC HIP FRACTURE;  Surgeon: Ollen Gross, MD;  Location: [facility] ORS;  Service: Orthopedics;  Laterality: Left;  . right shoulder repair  1985   Dr. [name]  . rotator cuff debridement  1999   Dr. [name]    OB History    No data available       Home Medications    Prior to Admission medications   Medication Sig Start Date End Date Taking? Authorizing Provider  acetaminophen (TYLENOL) 325 MG tablet Take 650 mg by mouth every 6 (six) hours as needed for mild pain.   Yes [provider]  acetaminophen (TYLENOL) 500 MG tablet Take 500 mg by mouth 3 (three) times daily.   Yes [provider]  albuterol (PROVENTIL HFA;VENTOLIN  HFA) 108 (90 Base) MCG/ACT inhaler Inhale 2 puffs into the lungs every 6 (six) hours as needed for wheezing or shortness of breath.   Yes [provider]  allopurinol (ZYLOPRIM) 100 MG tablet Take 100 mg by mouth daily.   Yes [provider]  Carboxymethylcellul-Glycerin (OPTIVE) 0.5-0.9 % SOLN Apply 2 drops to eye 2 (two) times daily.   Yes [provider]  carvedilol (COREG) 3.125 MG tablet Take 1.5625 mg by mouth 2 (two) times daily with a meal.    Yes [provider]  cyanocobalamin 1000 MCG tablet Take 1,000 mcg by mouth daily.    Yes [provider]  docusate (COLACE) 50 MG/5ML liquid Take 100 mg by mouth  daily.   Yes [provider]  furosemide (LASIX) 40 MG tablet Take 40 mg by mouth every other day. Alternate with 20mg  lasix   Yes [provider]  ipratropium-albuterol (DUONEB) 0.5-2.5 (3) MG/3ML SOLN Take 3 mLs by nebulization every 6 (six) hours as needed (wheezing).    Yes [provider]  lactose free nutrition (BOOST PLUS) LIQD Take 237 mLs daily by mouth.    Yes [provider]  Morphine Sulfate (MORPHINE CONCENTRATE) 10 mg / 0.5 ml concentrated solution Take 5 mg by mouth once.   Yes [provider]  nitroGLYCERIN (NITROSTAT) 0.4 MG SL tablet Place 0.4 mg under the tongue every 5 (five) minutes as needed for chest pain.   Yes [provider]  omeprazole (PRILOSEC OTC) 20 MG tablet Take 20 mg by mouth 2 (two) times daily.   Yes [provider]  polyethylene glycol (MIRALAX / GLYCOLAX) packet Take 17 g by mouth daily. Hold for loose stools   Yes [provider]  potassium chloride SA (K-DUR,KLOR-CON) 20 MEQ tablet Take 20 mEq by mouth daily.   Yes [provider]  ranitidine (ZANTAC) 150 MG tablet Take 150 mg by mouth at bedtime.   Yes [provider]  sertraline (ZOLOFT) 25 MG tablet Take 25 mg by mouth daily.   Yes [provider]    Family History Family History  Problem Relation Age of Onset  . Coronary artery disease Sister   . Heart attack Father [id number]  . Heart attack Mother   . Other Mother        abdominal blockage    Social History Social History   Tobacco Use  . Smoking status: Never Smoker  . Smokeless tobacco: Never Used  Substance Use Topics  . Alcohol use: No  . Drug use: No     Allergies   Arthrotec [diclofenac-misoprostol]; Ibuprofen; Lactose intolerance (gi); Milk-related compounds; Other; Oxycontin [oxycodone hcl]; and Pollen extract   Review of Systems Review of Systems  Unable to perform ROS: Dementia     Physical Exam Updated Vital Signs BP (!)  120/57   Pulse 82   Temp 99.4 F (37.4 C) (Oral)   Resp 20   SpO2 100%   Physical Exam  Constitutional: She appears well-developed.  Elderly, frail  HENT:  Head: Normocephalic and atraumatic.  Eyes: Conjunctivae and EOM are normal. Pupils are equal, round, and reactive to light.  Neck: Normal range of motion and phonation normal. Neck supple.  Cardiovascular: Normal rate and regular rhythm.  Pulmonary/Chest: Effort normal and breath sounds normal. She exhibits no tenderness.  Abdominal: Soft. She exhibits no distension. There is no tenderness. There is no guarding.  Musculoskeletal: Normal range of motion.  Neurological: She is alert. She exhibits normal muscle  tone.  Skin: Skin is warm and dry.  Psychiatric: She has a normal mood and affect. Her behavior is normal.  Nursing note and vitals reviewed.    ED Treatments / Results  Labs (all labs ordered are listed, but only abnormal results are displayed) Labs Reviewed  CBC WITH DIFFERENTIAL/PLATELET - Abnormal; Notable for the following components:      Result Value   WBC 14.3 (*)    RBC 3.56 (*)    Hemoglobin 11.1 (*)    HCT 35.9 (*)    MCV 100.8 (*)    Neutro Abs 11.2 (*)    Monocytes Absolute 1.3 (*)    All other components within normal limits  D-DIMER, QUANTITATIVE (NOT AT [facility]) - Abnormal; Notable for the following components:   D-Dimer, Quant 2.69 (*)    All other components within normal limits  I-STAT CHEM 8, ED - Abnormal; Notable for the following components:   BUN 24 (*)    Glucose, Bld 109 (*)    Calcium, Ion 1.10 (*)    Hemoglobin 10.9 (*)    HCT 32.0 (*)    All other components within normal limits  MAGNESIUM  I-STAT TROPONIN, ED  I-STAT TROPONIN, ED    EKG  EKG Interpretation  Date/Time:  Friday February 08 2017 09:56:05 EST Ventricular Rate:  92 PR Interval:    QRS Duration: 146 QT Interval:  398 QTC Calculation: 493 R Axis:   14 Text Interpretation:  Sinus rhythm Atrial premature complex  IVCD, consider atypical LBBB since last tracing no significant change Confirmed by Mancel Bale [phone]) on 02/08/2017 10:16:32 AM       Radiology Dg Chest Port 1 View  Result Date: 02/08/2017 CLINICAL DATA:  Chest pain and shortness of Breath EXAM: PORTABLE CHEST 1 VIEW COMPARISON:  04/04/2014 FINDINGS: Cardiac shadow is mildly enlarged. Large hiatal hernia is seen. Defibrillator is again noted and stable. The lungs are well aerated bilaterally. Persistent thickening of the minor fissure on the right is noted consistent with scarring. No focal infiltrate is seen. IMPRESSION: Large hiatal hernia. Chronic scarring in the right mid lung. Electronically Signed   By: Alcide Clever M.D.   On: 02/08/2017 12:04    Procedures Procedures (including critical care time)  Medications Ordered in ED Medications  calcium gluconate inj 10% (1 g) URGENT USE ONLY! (0 g Intravenous Hold 02/08/17 1213)  sodium chloride 0.9 % bolus 500 mL (0 mLs Intravenous Stopped 02/08/17 1229)  magnesium sulfate IVPB 2 g 50 mL (0 g Intravenous Stopped 02/08/17 1330)  fentaNYL (SUBLIMAZE) injection 12.5 mcg (12.5 mcg Intravenous Given 02/08/17 1637)  ondansetron (ZOFRAN) injection 4 mg (4 mg Intravenous Given 02/08/17 1637)  iopamidol (ISOVUE-370) 76 % injection (100 mLs  Contrast Given 02/08/17 1658)     Initial Impression / Assessment and Plan / ED Course  I have reviewed the triage vital signs and the nursing notes.  Pertinent labs & imaging results that were available during my care of the patient were reviewed by me and considered in my medical decision making (see chart for details).  Clinical Course as of Feb 08 1658  Fri Feb 08, 2017  1005 The patient had a short run of rapid rate, stated by nursing to be "V. tach."  No tracings were recovered to analyze.  Patient's daughter is here now and I have discussed the findings and potential implications of the rapid heartbeat.  Daughter understands workup is in  progress.  [EW]  1236 No acute disease  DG Chest Port 1 View [EW]  1236 Normal Troponin i, poc: 0.02 [EW]  1237 Normal Magnesium: 2.0 [EW]  1237 Elevated WBC: (!) 14.3 [EW]  1237 Low Hemoglobin: (!) 11.1 [EW]  1237 Elevated consistent with tachypnea Resp: (!) 25 [EW]  1237 Delta troponin and d-dimer ordered  [EW]    Clinical Course User Index [EW] Mancel Bale, MD     Patient Vitals for the past 24 hrs:  BP Temp Temp src Pulse Resp SpO2  02/08/17 1630 (!) 120/57 - - 82 20 100 %  02/08/17 1600 128/69 - - 80 (!) 22 99 %  02/08/17 1530 (!) 115/58 - - 76 20 98 %  02/08/17 1500 (!) 117/59 - - 76 (!) 21 100 %  02/08/17 1430 116/65 - - 81 18 100 %  02/08/17 1415 - - - 82 (!) 22 100 %  02/08/17 1400 121/67 - - 85 (!) 24 100 %  02/08/17 1345 - - - 86 (!) 26 99 %  02/08/17 1330 112/69 - - 86 (!) 24 100 %  02/08/17 1300 116/72 - - 94 (!) 25 (!) 88 %  02/08/17 1245 - - - 92 (!) 25 100 %  02/08/17 1230 137/75 - - (!) 102 (!) 22 97 %  02/08/17 1200 (!) 141/86 - - 96 (!) 25 99 %  02/08/17 1145 - - - 95 (!) 29 97 %  02/08/17 1130 137/83 - - 98 (!) 23 100 %  02/08/17 1115 - - - (!) 101 (!) 23 99 %  02/08/17 1100 120/89 - - 98 (!) 26 100 %  02/08/17 1015 120/82 - - (!) 103 (!) 25 98 %  02/08/17 1000 118/77 - - 88 (!) 23 100 %  02/08/17 0955 126/82 99.4 F (37.4 C) Oral 99 20 100 %        Final Clinical Impressions(s) / ED Diagnoses   Final diagnoses:  Nonspecific chest pain  Abdominal pain, unspecified abdominal location   Nonspecific chest and abdominal pain with elevated d-dimer.  Patient is DNR and unable to contribute to history.  CT angiogram chest abdomen pelvis ordered to evaluate for both thoracic and intra-abdominal abnormalities.  Nursing Notes Reviewed/ Care Coordinated Applicable Imaging Reviewed Interpretation of Laboratory Data incorporated into ED treatment  Plan-Dr. Adela Lank to evaluate after imaging returns and consider disposition.  With negative studies, the  patient could likely be managed at her usual living setting, skilled nursing facility.  ED Discharge Orders    None       Mancel Bale, MD 02/08/17 1715

## 2017-02-08 NOTE — ED Notes (Signed)
ED Provider at bedside. 

## 2017-02-08 NOTE — ED Notes (Signed)
Patient transported to Ultrasound 

## 2017-02-08 NOTE — ED Triage Notes (Signed)
Pt arrived via [facility] from [facility] living. Staff reported pt experienced sharp CP with cough. [facility] staff administered 3 nitro SL, 324 ASA, 1x Duoneb, and 5mg  Roxanol at facility PTA EMS arrival. Staff reported pt had similar episode on Tuesday of this week. Pt has hx of dementia and baseline is A&ox1. Pt wheezing upon arrival to ED. Pt is a DNR, paperwork at bedside.

## 2017-02-08 NOTE — ED Notes (Signed)
Family remains at bedside.

## 2017-02-09 ENCOUNTER — Inpatient Hospital Stay (HOSPITAL_COMMUNITY): Payer: Medicare Other

## 2017-02-09 ENCOUNTER — Other Ambulatory Visit: Payer: Self-pay

## 2017-02-09 DIAGNOSIS — I2510 Atherosclerotic heart disease of native coronary artery without angina pectoris: Secondary | ICD-10-CM

## 2017-02-09 DIAGNOSIS — R079 Chest pain, unspecified: Secondary | ICD-10-CM

## 2017-02-09 DIAGNOSIS — R072 Precordial pain: Secondary | ICD-10-CM

## 2017-02-09 DIAGNOSIS — Z950 Presence of cardiac pacemaker: Secondary | ICD-10-CM

## 2017-02-09 DIAGNOSIS — I5022 Chronic systolic (congestive) heart failure: Secondary | ICD-10-CM

## 2017-02-09 DIAGNOSIS — I447 Left bundle-branch block, unspecified: Secondary | ICD-10-CM

## 2017-02-09 DIAGNOSIS — K8020 Calculus of gallbladder without cholecystitis without obstruction: Principal | ICD-10-CM

## 2017-02-09 DIAGNOSIS — R509 Fever, unspecified: Secondary | ICD-10-CM

## 2017-02-09 LAB — CBC
HCT: 31.5 % — ABNORMAL LOW (ref 36.0–46.0)
HEMOGLOBIN: 9.6 g/dL — AB (ref 12.0–15.0)
MCH: 31.6 pg (ref 26.0–34.0)
MCHC: 30.5 g/dL (ref 30.0–36.0)
MCV: 103.6 fL — ABNORMAL HIGH (ref 78.0–100.0)
PLATELETS: 181 10*3/uL (ref 150–400)
RBC: 3.04 MIL/uL — AB (ref 3.87–5.11)
RDW: 15 % (ref 11.5–15.5)
WBC: 8.9 10*3/uL (ref 4.0–10.5)

## 2017-02-09 LAB — MRSA PCR SCREENING: MRSA BY PCR: NEGATIVE

## 2017-02-09 LAB — COMPREHENSIVE METABOLIC PANEL
ALBUMIN: 2.7 g/dL — AB (ref 3.5–5.0)
ALT: 11 U/L — AB (ref 14–54)
AST: 15 U/L (ref 15–41)
Alkaline Phosphatase: 64 U/L (ref 38–126)
Anion gap: 5 (ref 5–15)
BUN: 20 mg/dL (ref 6–20)
CHLORIDE: 106 mmol/L (ref 101–111)
CO2: 27 mmol/L (ref 22–32)
CREATININE: 0.94 mg/dL (ref 0.44–1.00)
Calcium: 7.7 mg/dL — ABNORMAL LOW (ref 8.9–10.3)
GFR calc Af Amer: 58 mL/min — ABNORMAL LOW (ref >60)
GFR, EST NON AFRICAN AMERICAN: 50 mL/min — AB (ref >60)
Glucose, Bld: 107 mg/dL — ABNORMAL HIGH (ref 65–99)
POTASSIUM: 4.2 mmol/L (ref 3.5–5.1)
SODIUM: 138 mmol/L (ref 135–145)
Total Bilirubin: 1.1 mg/dL (ref 0.3–1.2)
Total Protein: 6 g/dL — ABNORMAL LOW (ref 6.5–8.1)

## 2017-02-09 LAB — LIPID PANEL
Cholesterol: 159 mg/dL (ref 0–200)
HDL: 49 mg/dL (ref >40)
LDL CALC: 96 mg/dL (ref 0–99)
Total CHOL/HDL Ratio: 3.2 RATIO
Triglycerides: 69 mg/dL (ref <150)
VLDL: 14 mg/dL (ref 0–40)

## 2017-02-09 LAB — TROPONIN I: TROPONIN I: 0.03 ng/mL — AB (ref <0.03)

## 2017-02-09 MED ORDER — POLYVINYL ALCOHOL 1.4 % OP SOLN
2.0000 [drp] | Freq: Two times a day (BID) | OPHTHALMIC | Status: DC
  Administered 2017-02-09 – 2017-02-11 (×4): 2 [drp] via OPHTHALMIC
  Filled 2017-02-09 (×2): qty 15

## 2017-02-09 MED ORDER — ASPIRIN EC 325 MG PO TBEC
325.0000 mg | DELAYED_RELEASE_TABLET | Freq: Every day | ORAL | Status: DC
  Administered 2017-02-09 – 2017-02-10 (×2): 325 mg via ORAL
  Filled 2017-02-09 (×2): qty 1

## 2017-02-09 MED ORDER — TECHNETIUM TC 99M MEBROFENIN IV KIT
5.3000 | PACK | Freq: Once | INTRAVENOUS | Status: AC | PRN
  Administered 2017-02-09: 5.3 via INTRAVENOUS

## 2017-02-09 MED ORDER — FUROSEMIDE 10 MG/ML IJ SOLN
INTRAMUSCULAR | Status: AC
  Administered 2017-02-09: 40 mg
  Filled 2017-02-09: qty 4

## 2017-02-09 MED ORDER — PIPERACILLIN-TAZOBACTAM 3.375 G IVPB
3.3750 g | Freq: Three times a day (TID) | INTRAVENOUS | Status: DC
  Administered 2017-02-09: 3.375 g via INTRAVENOUS
  Filled 2017-02-09 (×2): qty 50

## 2017-02-09 MED ORDER — VITAMIN B-12 1000 MCG PO TABS
1000.0000 ug | ORAL_TABLET | Freq: Every day | ORAL | Status: DC
  Administered 2017-02-09 – 2017-02-11 (×3): 1000 ug via ORAL
  Filled 2017-02-09 (×4): qty 1

## 2017-02-09 MED ORDER — NITROGLYCERIN 0.4 MG SL SUBL: 0.4000 mg | SUBLINGUAL_TABLET | SUBLINGUAL | Status: DC | PRN

## 2017-02-09 MED ORDER — ALBUTEROL SULFATE (2.5 MG/3ML) 0.083% IN NEBU: 2.5000 mg | INHALATION_SOLUTION | Freq: Four times a day (QID) | RESPIRATORY_TRACT | Status: DC | PRN

## 2017-02-09 MED ORDER — POLYETHYLENE GLYCOL 3350 17 G PO PACK
17.0000 g | PACK | Freq: Every day | ORAL | Status: DC
  Administered 2017-02-09 – 2017-02-11 (×2): 17 g via ORAL
  Filled 2017-02-09 (×2): qty 1

## 2017-02-09 MED ORDER — ACETAMINOPHEN 650 MG RE SUPP
650.0000 mg | Freq: Four times a day (QID) | RECTAL | Status: DC | PRN
Start: 2017-02-09 — End: 2017-02-11

## 2017-02-09 MED ORDER — IPRATROPIUM-ALBUTEROL 0.5-2.5 (3) MG/3ML IN SOLN: 3.0000 mL | Freq: Four times a day (QID) | RESPIRATORY_TRACT | Status: DC | PRN

## 2017-02-09 MED ORDER — DOCUSATE SODIUM 50 MG/5ML PO LIQD
100.0000 mg | Freq: Every day | ORAL | Status: DC
  Administered 2017-02-09 – 2017-02-11 (×2): 100 mg via ORAL
  Filled 2017-02-09 (×2): qty 10

## 2017-02-09 MED ORDER — ALBUTEROL SULFATE HFA 108 (90 BASE) MCG/ACT IN AERS: 2.0000 | INHALATION_SPRAY | Freq: Four times a day (QID) | RESPIRATORY_TRACT | Status: DC | PRN

## 2017-02-09 MED ORDER — SODIUM CHLORIDE 0.9% FLUSH: 3.0000 mL | INTRAVENOUS | Status: DC | PRN

## 2017-02-09 MED ORDER — ENOXAPARIN SODIUM 40 MG/0.4ML ~~LOC~~ SOLN
40.0000 mg | SUBCUTANEOUS | Status: DC
  Administered 2017-02-09 – 2017-02-11 (×3): 40 mg via SUBCUTANEOUS
  Filled 2017-02-09 (×4): qty 0.4

## 2017-02-09 MED ORDER — SODIUM CHLORIDE 0.9 % IV SOLN: 250.0000 mL | INTRAVENOUS | Status: DC | PRN

## 2017-02-09 MED ORDER — BOOST PLUS PO LIQD
237.0000 mL | Freq: Every day | ORAL | Status: DC
  Administered 2017-02-09 – 2017-02-11 (×3): 237 mL via ORAL
  Filled 2017-02-09 (×5): qty 237

## 2017-02-09 MED ORDER — ORAL CARE MOUTH RINSE
15.0000 mL | Freq: Two times a day (BID) | OROMUCOSAL | Status: DC
  Administered 2017-02-11: 15 mL via OROMUCOSAL

## 2017-02-09 MED ORDER — PANTOPRAZOLE SODIUM 40 MG PO TBEC
40.0000 mg | DELAYED_RELEASE_TABLET | Freq: Two times a day (BID) | ORAL | Status: DC
  Administered 2017-02-09 – 2017-02-11 (×6): 40 mg via ORAL
  Filled 2017-02-09 (×7): qty 1

## 2017-02-09 MED ORDER — SODIUM CHLORIDE 0.9% FLUSH
3.0000 mL | Freq: Two times a day (BID) | INTRAVENOUS | Status: DC
  Administered 2017-02-09 – 2017-02-11 (×5): 3 mL via INTRAVENOUS

## 2017-02-09 MED ORDER — VANCOMYCIN HCL IN DEXTROSE 750-5 MG/150ML-% IV SOLN: 750.0000 mg | INTRAVENOUS | Status: DC

## 2017-02-09 MED ORDER — ALLOPURINOL 100 MG PO TABS
100.0000 mg | ORAL_TABLET | Freq: Every day | ORAL | Status: DC
Start: 2017-02-09 — End: 2017-02-11
  Administered 2017-02-09 – 2017-02-11 (×3): 100 mg via ORAL
  Filled 2017-02-09 (×4): qty 1

## 2017-02-09 MED ORDER — ACETAMINOPHEN 325 MG PO TABS
650.0000 mg | ORAL_TABLET | Freq: Four times a day (QID) | ORAL | Status: DC | PRN
  Filled 2017-02-09: qty 2

## 2017-02-09 MED ORDER — LEVALBUTEROL HCL 0.63 MG/3ML IN NEBU
0.6300 mg | INHALATION_SOLUTION | Freq: Once | RESPIRATORY_TRACT | Status: AC
  Administered 2017-02-09: 0.63 mg via RESPIRATORY_TRACT

## 2017-02-09 MED ORDER — CARVEDILOL 3.125 MG PO TABS
1.5625 mg | ORAL_TABLET | Freq: Two times a day (BID) | ORAL | Status: DC
  Administered 2017-02-09 – 2017-02-11 (×5): 1.5625 mg via ORAL
  Filled 2017-02-09 (×6): qty 1

## 2017-02-09 MED ORDER — FUROSEMIDE 10 MG/ML IJ SOLN
40.0000 mg | Freq: Once | INTRAMUSCULAR | Status: AC
  Administered 2017-02-09: 40 mg via INTRAVENOUS
  Filled 2017-02-09: qty 4

## 2017-02-09 MED ORDER — SERTRALINE HCL 50 MG PO TABS
25.0000 mg | ORAL_TABLET | Freq: Every day | ORAL | Status: DC
  Administered 2017-02-09 – 2017-02-11 (×3): 25 mg via ORAL
  Filled 2017-02-09 (×4): qty 1

## 2017-02-09 MED ORDER — LEVALBUTEROL HCL 0.63 MG/3ML IN NEBU
INHALATION_SOLUTION | RESPIRATORY_TRACT | Status: AC
  Administered 2017-02-09: 0.63 mg via RESPIRATORY_TRACT
  Filled 2017-02-09: qty 3

## 2017-02-09 MED ORDER — ATORVASTATIN CALCIUM 80 MG PO TABS
80.0000 mg | ORAL_TABLET | Freq: Every day | ORAL | Status: AC
  Administered 2017-02-09 – 2017-02-10 (×2): 80 mg via ORAL
  Filled 2017-02-09 (×2): qty 1

## 2017-02-09 NOTE — Progress Notes (Signed)
Pharmacy Antibiotic Note  [name] is a 83 y.o. female admitted on 02/08/2017 with sepsis.  Pharmacy has been consulted for Vancomycin/Zosyn dosing. WBC WNL. Renal function age appropriate.   Plan: Vancomycin 750 mg IV q24h Zosyn 3.375G IV q8h to be infused over 4 hours Trend WBC, temp, renal function  F/U infectious work-up Drug levels as indicated  Temp (24hrs), Avg:100 F (37.8 C), Min:99.4 F (37.4 C), Max:100.5 F (38.1 C)  Recent Labs  Lab 02/08/17 1045 02/08/17 1054 02/08/17 1938 02/08/17 2133 02/08/17 2254  WBC 14.3*  --   --   --   --   CREATININE  --  1.00 0.95  --   --   LATICACIDVEN  --   --   --  1.28 1.15    Estimated Creatinine Clearance: 35.3 mL/min (by C-G formula based on SCr of 0.95 mg/dL).    Allergies  Allergen Reactions  . Arthrotec [Diclofenac-Misoprostol]   . Ibuprofen   . Lactose Intolerance (Gi)   . Milk-Related Compounds Nausea And Vomiting    Cheese  . Other     Grass and ragweed   . Oxycontin [Oxycodone Hcl]   . Pollen Extract     Abran Duke 02/09/2017 1:38 AM

## 2017-02-09 NOTE — ED Notes (Signed)
Report given to RN on 2C-- pt remains in Nuclear Medicine

## 2017-02-09 NOTE — ED Notes (Signed)
Report to Nuc Med

## 2017-02-09 NOTE — Consult Note (Signed)
ELECTROPHYSIOLOGY CONSULT NOTE  Patient ID: [name], MRN: [id number], DOB/AGE: 81/03/1920 81 y.o. Admit date: 02/08/2017 Date of Consult: 02/09/2017  Primary Physician: [name], [name], [name] Primary Cardiologist:  [name] is a 83 y.o. female who is being seen today for the evaluation of ICD function  at the request of [name] DB.   Chief Complaint: CHF   HPI [name] is a 83 y.o. female  Admitted for worsening shortness of breath and lower extremity edema.  There have been concerns of sepsis.  She has small bilateral effusions.  She has cholelithiasis without evidence of cholecystitis.  She is on antibiotics Her  shortness of breath is worsening  She has a history of a previously implanted CRT-D in 2007 with interval resolution of LVEF  2012 it was about 65%.  She underwent system revision 2013 with a fracture defibrillator lead and her LV lead was incorporated into the RV pacing port and the AV delay was programmed at 2 7 0 ms such that she was no longer ventricularly pacing.  Bedside echo today demonstrated an EF of 25% or so.     Past Medical History:  Diagnosis Date  . CAD (coronary artery disease)   . Chronic low back pain   . Contusion of foot, right 05/15/12  . Diverticulitis   . Dyslipidemia   . H/O: hysterectomy   . HTN (hypertension)   . Memory disturbance 05/2012   MMSE 26/30, intact Clock test  . Nonischemic cardiomyopathy (HCC)   . Osteoporosis   . Pulmonary embolism (HCC)   . Right knee pain 05/15/12  . Spinal stenosis   . Ulcer disease   . Weight loss       Surgical History:  Past Surgical History:  Procedure Laterality Date  . APPENDECTOMY  2010  . BACK SURGERY    . BIV ICD GENERTAOR CHANGE OUT N/A 05/24/2011   Procedure: BIV ICD GENERTAOR CHANGE OUT;  Surgeon: Marinus MawGregg W Taylor, MD;  [facility];  Service: Cardiovascular;  Laterality: N/A;  . CATARACT EXTRACTION    . COLON RESECTION  2005  . CORONARY ANGIOPLASTY WITH STENT  PLACEMENT  01/2003  . HEMICOLECTOMY    . HIP ARTHROPLASTY Left 04/05/2014   Procedure: ARTHROPLASTY BIPOLAR HIP;  Surgeon: Christine PalMatthew D Olin, MD;  Location: [facility];  Service: Orthopedics;  Laterality: Left;  . left shoulder replacement  1991   [name]. [name]  . ORIF HIP FRACTURE Left 09/30/2016   Procedure: OPEN REDUCTION INTERNAL FIXATION PERIPROSTHETIC HIP FRACTURE;  Surgeon: [name], Frank, MD;  Location: [facility];  Service: Orthopedics;  Laterality: Left;  . right shoulder repair  1985   [name]. [name]  . rotator cuff debridement  1999   [name]. [name]     Home Meds: Prior to Admission medications   Medication Sig Start Date End Date Taking? Authorizing Provider  acetaminophen (TYLENOL) 325 MG tablet Take 650 mg by mouth every 6 (six) hours as needed for mild pain.   Yes [provider]  acetaminophen (TYLENOL) 500 MG tablet Take 500 mg by mouth 3 (three) times daily.   Yes [provider]  albuterol (PROVENTIL HFA;VENTOLIN HFA) 108 (90 Base) MCG/ACT inhaler Inhale 2 puffs into the lungs every 6 (six) hours as needed for wheezing or shortness of breath.   Yes [provider]  allopurinol (ZYLOPRIM) 100 MG tablet Take 100 mg by mouth daily.   Yes [provider]  Carboxymethylcellul-Glycerin (OPTIVE) 0.5-0.9 % SOLN  Apply 2 drops to eye 2 (two) times daily.   Yes [provider]  carvedilol (COREG) 3.125 MG tablet Take 1.5625 mg by mouth 2 (two) times daily with a meal.    Yes [provider]  cyanocobalamin 1000 MCG tablet Take 1,000 mcg by mouth daily.    Yes [provider]  docusate (COLACE) 50 MG/5ML liquid Take 100 mg by mouth daily.   Yes [provider]  furosemide (LASIX) 40 MG tablet Take 40 mg by mouth every other day. Alternate with 20mg  lasix   Yes [provider]  ipratropium-albuterol (DUONEB) 0.5-2.5 (3) MG/3ML SOLN Take 3 mLs by nebulization every 6 (six) hours as needed (wheezing).    Yes [provider]    lactose free nutrition (BOOST PLUS) LIQD Take 237 mLs daily by mouth.    Yes [provider]  Morphine Sulfate (MORPHINE CONCENTRATE) 10 mg / 0.5 ml concentrated solution Take 5 mg by mouth once.   Yes [provider]  nitroGLYCERIN (NITROSTAT) 0.4 MG SL tablet Place 0.4 mg under the tongue every 5 (five) minutes as needed for chest pain.   Yes [provider]  omeprazole (PRILOSEC OTC) 20 MG tablet Take 20 mg by mouth 2 (two) times daily.   Yes [provider]  polyethylene glycol (MIRALAX / GLYCOLAX) packet Take 17 g by mouth daily. Hold for loose stools   Yes [provider]  potassium chloride SA (K-DUR,KLOR-CON) 20 MEQ tablet Take 20 mEq by mouth daily.   Yes [provider]  ranitidine (ZANTAC) 150 MG tablet Take 150 mg by mouth at bedtime.   Yes [provider]  sertraline (ZOLOFT) 25 MG tablet Take 25 mg by mouth daily.   Yes [provider]    Inpatient Medications:  . allopurinol  100 mg Oral Daily  . aspirin EC  325 mg Oral Daily  . atorvastatin  80 mg Oral q1800  . calcium gluconate  1 g Intravenous Once  . carvedilol  1.5625 mg Oral BID WC  . docusate  100 mg Oral Daily  . enoxaparin (LOVENOX) injection  40 mg Subcutaneous Q24H  . lactose free nutrition  237 mL Oral Daily  .  morphine injection  2 mg Intravenous Once  . pantoprazole  40 mg Oral BID  . polyethylene glycol  17 g Oral Daily  . polyvinyl alcohol  2 drop Both Eyes BID  . sertraline  25 mg Oral Daily  . sodium chloride flush  3 mL Intravenous Q12H  . cyanocobalamin  1,000 mcg Oral Daily     Allergies:  Allergies  Allergen Reactions  . Arthrotec [Diclofenac-Misoprostol]   . Ibuprofen   . Lactose Intolerance (Gi)   . Milk-Related Compounds Nausea And Vomiting    Cheese  . Other     Grass and ragweed   . Oxycontin [Oxycodone Hcl]   . Pollen Extract     Social History   Socioeconomic History  . Marital status: Widowed    Spouse  name: Not on file  . Number of children: Not on file  . Years of education: Not on file  . Highest education level: Not on file  Social Needs  . Financial resource strain: Not on file  . Food insecurity - worry: Not on file  . Food insecurity - inability: Not on file  . Transportation needs - medical: Not on file  . Transportation needs - non-medical: Not on file  Occupational History  . Not on  file  Tobacco Use  . Smoking status: Never Smoker  . Smokeless tobacco: Never Used  Substance and Sexual Activity  . Alcohol use: No  . Drug use: No  . Sexual activity: Not on file  Other Topics Concern  . Not on file  Social History Narrative  . Not on file     Family History  Problem Relation Age of Onset  . Coronary artery disease Sister   . Heart attack Father 72  . Heart attack Mother   . Other Mother        abdominal blockage     ROS:  Please see the history of present illness.     All other systems reviewed and negative.    Physical Exam: Blood pressure (!) 113/59, pulse 75, temperature 97.8 F (36.6 C), temperature source Oral, resp. rate 15, height 5\' 5"  (1.651 m), weight 183 lb 10.3 oz (83.3 kg), SpO2 97 %. General: Well developed, well nourished female in no acute distress. Head: Normocephalic, atraumatic, sclera non-icteric, no xanthomas, nares are without discharge. EENT: normal Lymph Nodes:  none Back: with mild kyphosis, no CVA tendersness Neck: Negative for carotid bruits. JVD greater than 10 cm Lungs: Diffuse wheezing bilaterally with an I:E of 1: 3 Heart: RRR with S1 S2.  2/6 systolic murmur , rubs, or gallops appreciated. Abdomen: Soft, non-tender, non-distended with normoactive bowel sounds. No hepatomegaly. No rebound/guarding. No obvious abdominal masses. Msk:  Strength and tone appear normal for age. Extremities: No clubbing or cyanosis.  1+ edema.  Distal pedal pulses are 2+ and equal bilaterally. Skin: Warm and Dry Neuro: Alert and oriented X 3. CN  III-XII intact Grossly normal sensory and motor function . Psych:  Responds to questions appropriately with a normal affect.      Labs: Cardiac Enzymes Recent Labs    02/09/17 0349 02/09/17 0710  TROPONINI 0.03* <0.03   CBC Lab Results  Component Value Date   WBC 14.3 (H) 02/08/2017   HGB 10.9 (Henry) 02/08/2017   HCT 32.0 (Henry) 02/08/2017   MCV 100.8 (H) 02/08/2017   PLT 202 02/08/2017   PROTIME: No results for input(s): LABPROT, INR in the last 72 hours. Chemistry  Recent Labs  Lab 02/09/17 0349  NA 138  K 4.2  CL 106  CO2 27  BUN 20  CREATININE 0.94  CALCIUM 7.7*  PROT 6.0*  BILITOT 1.1  ALKPHOS 64  ALT 11*  AST 15  GLUCOSE 107*   Lipids Lab Results  Component Value Date   CHOL 159 02/09/2017   HDL 49 02/09/2017   LDLCALC 96 02/09/2017   TRIG 69 02/09/2017   BNP Pro B Natriuretic peptide (BNP)  Date/Time Value Ref Range Status  01/06/2011 04:24 PM 807.2 (H) 0 - 450 pg/mL Final   Thyroid Function Tests: No results for input(s): TSH, T4TOTAL, T3FREE, THYROIDAB in the last 72 hours.  Invalid input(s): FREET3    Miscellaneous Lab Results  Component Value Date   DDIMER 2.69 (H) 02/08/2017    Radiology/Studies:  Nm Hepatobiliary Liver Func  Result Date: 02/09/2017 CLINICAL DATA:  83 year old with right upper quadrant pain and suspect cholecystitis. EXAM: NUCLEAR MEDICINE HEPATOBILIARY IMAGING TECHNIQUE: Sequential images of the abdomen were obtained out to 60 minutes following intravenous administration of radiopharmaceutical. RADIOPHARMACEUTICALS:  5.3 mCi Tc-76m  Choletec IV COMPARISON:  Ultrasound 02/08/2017 FINDINGS: Prompt uptake and biliary excretion of activity by the liver is seen. Gallbladder activity is visualized, consistent with patency of cystic duct. Biliary activity passes into small  bowel, consistent with patent common bile duct. IMPRESSION: Normal hepatobiliary examination.  The cystic duct is patent. Electronically Signed   By: Richarda Overlie M.D.   On: 02/09/2017 10:24   Dg Chest Port 1 View  Result Date: 02/08/2017 CLINICAL DATA:  Chest pain and shortness of Breath EXAM: PORTABLE CHEST 1 VIEW COMPARISON:  04/04/2014 FINDINGS: Cardiac shadow is mildly enlarged. Large hiatal hernia is seen. Defibrillator is again noted and stable. The lungs are well aerated bilaterally. Persistent thickening of the minor fissure on the right is noted consistent with scarring. No focal infiltrate is seen. IMPRESSION: Large hiatal hernia. Chronic scarring in the right mid lung. Electronically Signed   By: Alcide Clever M.D.   On: 02/08/2017 12:04   Ct Angio Chest/abd/pel For Dissection W And/or Wo Contrast  Result Date: 02/08/2017 CLINICAL DATA:  Dyspnea x3 days. EXAM: CT ANGIOGRAPHY CHEST, ABDOMEN AND PELVIS TECHNIQUE: Multidetector CT imaging through the chest, abdomen and pelvis was performed using the standard protocol during bolus administration of intravenous contrast. Multiplanar reconstructed images and MIPs were obtained and reviewed to evaluate the vascular anatomy. CONTRAST:  ISOVUE-370 IOPAMIDOL (ISOVUE-370) INJECTION 76% COMPARISON:  Same day CXR.  Chest CT from 02/06/2011. FINDINGS: CTA CHEST FINDINGS Cardiovascular: Stable cardiomegaly with ICD device along the left anterior chest wall and leads in the right atrium coronary sinus and right ventricle. No pericardial effusion. Left main and three-vessel coronary arteriosclerosis. No large central pulmonary embolus. Mediastinum/Nodes: Mild atherosclerosis of the thoracic aorta at the arch and along the descending portion. No aneurysm or dissection. No mediastinal adenopathy. Patent trachea and mainstem bronchi. Large hiatal hernia. Lungs/Pleura: Small bilateral pleural effusions adjacent to the large hiatal hernia. Areas of compressive atelectasis are noted at each lung base with dependent atelectasis along the posterior aspect of both lower lobes. Respiratory motion artifacts limit  assessment. Musculoskeletal: Thoracic spondylosis with kyphosis attributable to multilevel degenerative disc disease. No acute nor suspicious osseous abnormality. Left shoulder arthroplasty. Chronic posterior left lower rib fractures. Review of the MIP images confirms the above findings. CTA ABDOMEN AND PELVIS FINDINGS VASCULAR Aorta: No aneurysm or dissection. Moderate atherosclerosis. No significant stenosis. Celiac: Minimal atherosclerosis at the origin. The celiac axis and branch vessels are patent without significant stenosis, aneurysm or dissection. SMA: Atherosclerosis at the origin of the SMA without significant stenosis. No occlusion, dissection or aneurysm. Renals: Atherosclerosis of the origins of both renal arteries without significant luminal narrowing, fibromuscular dysplasia, aneurysm or dissection. IMA: Patent. Inflow: Patent without evidence of aneurysm, dissection, vasculitis or significant stenosis. Mild atherosclerosis of the right common and both internal iliac arteries. Veins: No obvious venous abnormality within the limitations of this arterial phase study. Review of the MIP images confirms the above findings. NON-VASCULAR Hepatobiliary: No focal liver abnormality is seen. Tiny layering calculi within the physiologically distended gallbladder. No mural thickening or pericholecystic fluid. Pancreas: Normal without ductal dilatation or enhancing mass. Spleen: No splenomegaly or mass. Adrenals/Urinary Tract: Bilateral renal cortical thinning with right interpolar renal cyst measuring 2.9 cm. Mild thickening of the adrenal glands without focal mass. No nephrolithiasis nor hydroureteronephrosis. The urinary bladder is partially obscured by the patient's hip arthroplasty on the left. Stomach/Bowel: Large hiatal hernia as previously mentioned. No bowel obstruction or inflammation. Partial colectomy. Lymphatic: No lymphadenopathy. Reproductive: The lower pelvis is obscured by the patient's left hip  arthroplasty. The uterus is either atrophic or surgically absent. No adnexal mass. Other: No free air free fluid. Musculoskeletal: Old posttraumatic deformity of the pubic rami  with healing. Dextroscoliosis of the upper thoracic spine. Osteoarthritis of the native right hip. Lumbar spondylosis. Review of the MIP images confirms the above findings. IMPRESSION: 1. Small bilateral pleural effusions with compressive atelectasis adjacent to a large known hiatal hernia containing much of the stomach. No active pulmonary disease. 2. Minimal thoracic aortic atherosclerosis without aneurysmal dilatation or evidence of dissection. 3. Left main and three-vessel coronary arteriosclerosis. 4. Ectasia of the abdominal aorta with patent branch vessels. Minimal atherosclerosis at the origins of the branch vessels as above described without significant luminal narrowing. 5. Thoracolumbar spondylosis with left hip and shoulder arthroplasties. Lumbar dextroscoliosis. Chronic bilateral pubic rami fractures. 6. 2.9 cm right interpolar renal cysts with mild cortical thinning of both kidneys. No obstructive uropathy. 7. Uncomplicated appearing cholelithiasis. Electronically Signed   By: Tollie Eth M.D.   On: 02/08/2017 18:02   US Abdomen Limited Ruq  Result Date: 02/08/2017 CLINICAL DATA:  Right upper quadrant abdominal pain. EXAM: ULTRASOUND ABDOMEN LIMITED RIGHT UPPER QUADRANT COMPARISON:  Ultrasound of May 04, 2015. FINDINGS: Gallbladder: 6 mm gallstone is noted. No gallbladder wall thickening or pericholecystic fluid is noted. No sonographic Murphy's sign is noted. Common bile duct: Diameter: 5 mm which is within normal limits. Liver: 8 mm cyst is noted in left hepatic lobe. Within normal limits in parenchymal echogenicity. Portal vein is patent on color Doppler imaging with normal direction of blood flow towards the liver. IMPRESSION: Cholelithiasis without definite evidence of cholecystitis. Small left hepatic cyst.  Electronically Signed   By: Lupita Raider, M.D.   On: 02/08/2017 20:43    EKG: sinus with LBBB QRSd 146   Assessment and Plan:  Acute on chronic systolic heart failure  Nonischemic cardiomyopathy  Left bundle branch block  Previously implanted pacemaker [employer] with the AV delay programmed to 270 ms   She has acute decompensated heart failure now aggravated by reactive airways.  She has been given some Lasix but is not responding very briskly.  I have interrogated her device and have reprogrammed the AV delay from 270 "/240 >>> 130/110.  Significant narrowing of the QRS was noted; repeat ECG is pending  I spoke with [name]. Dorthea Cove.  We will give her breathing treatment for the wheezing as well as augment her diuresis.  We will place a Foley catheter.    Sherryl Manges

## 2017-02-09 NOTE — Progress Notes (Signed)
Paged dr Ella Jubilee regarding pt having 6 beat run vtach and some wide qrs complex,, pt asymptomatic. No new orders, will continue to monitor. Contacted CMT to make aware

## 2017-02-09 NOTE — ED Notes (Signed)
Pt transported too [facility].

## 2017-02-09 NOTE — Progress Notes (Signed)
PROGRESS NOTE    [name]  DXI:[id number] DOB: Apr 19, 1920 DOA: 02/08/2017 PCP: [name], DO    Brief Narrative:  83 year old female who presented for chest pain. She does have the significant past medical history of pulmonary embolism, hypertension, coronary artery disease, dyslipidemia. Patient developed sharp chest pain for about 10 hours, substernal, non-radiating. On initial physical examination her blood pressure was 98/50, heart rate 85 bpm, temperature 100.5, respiratory rate 17, oxygen saturation 97%. His lungs were clear to auscultation bilaterally, no wheezing, rales or rhonchi, heart S1-S2 present regular and rhythmic, the abdomen was soft nontender, no lower extremity edema. Sodium 138, potassium 4.7, chloride 102, bicarbonate 30, glucose 132, BUN 19, creatinine 0.95, AST 20, ALT 13, troponin 0.03, white cell count 14.3, hemoglobin 11.1, hematocrit 35.9, platelets 202, d-dimer 2.69, urine analysis negative for infection, specific gravity greater than 1.046. Chest x-ray, right rotation, hypoinflation, no infiltrates, effusions or signs of pneumothorax. Pacemaker in place, with right atrial and ventricular leads in place. CT of the chest and abdomen with no acute findings, beside small bilateral pleural effusions. Positive cholelithiasis. Ultrasound of the abdomen with cholelithiasis but no signs of cholecystitis. EKG sinus rhythm rate 92 bpm, left axis deviation, left bundle-branch block, T-wave inversions in V5 and V6, poor R-wave progression, positive premature atrial complex. (Chronic changes compared to July 2018).    Patient was admitted to the hospital with working diagnosis of chest pain and fever rule out sepsis.  Assessment & Plan:   Principal Problem:   Fever Active Problems:   Sepsis (HCC)   Chest pain   Abdominal pain   1. Fever to rule out sepsis in the setting of gallstones. No source of infection identified, hepatobiliary scan negative, US abdomen with no  signs of cholecystitis, will discontinue antibiotic therapy and will continue to follow on cell count and temperature curve. Holding IV fluids due to systolic heart failure. Will advance diet as tolerated.   2. Ischemic cardiomyopathy with systolic heart failure, ejection fraction 20% and sinus node dysfunction status post pacemaker (DDD). Patient with very poor functional physical capacity, only able to move from bed to chair with assistance, no current chest pain. Had one dose of furosemide today to keep negative fluid balance. Continue with carvedilol,   3. Hypertension. Systolic blood pressure 104 to 113, likely result from significant of reduction on LV systolic function.   4. Coronary artery disease. Patient with chest pain free, no evidence of angina, no clinical signs of acute coronary syndrome. Continue asa and statin therapy.   5. Dyslipidemia. Continue statin therapy.   DVT prophylaxis: enoxaparin  Code Status: DNR Family Communication:  Disposition Plan:    Consultants:   Heart failure  General surgery  Procedures:     Antimicrobials:   Vancomycin discontinued.   Zosyn discontinued.   Subjective: Patient feeling better, no chest pain or abdominal pain, no nausea or vomiting, no fever or chills. At the nursing home with limited mobility due to recent femur fracture  Objective: Vitals:   02/09/17 0745 02/09/17 0900 02/09/17 1036 02/09/17 1205  BP: 108/64 101/67 (!) 104/58 (!) 113/59  Pulse: 71 77 75   Resp: 15 16 15    Temp:   (!) 97.5 F (36.4 C) 97.8 F (36.6 C)  TempSrc:   Oral Oral  SpO2: 100% 99% 97%   Weight:   83.3 kg (183 lb 10.3 oz)   Height:   5\' 5"  (1.651 m)     Intake/Output Summary (Last 24 hours)  at 02/09/2017 1237 Last data filed at 02/09/2017 0810 Gross per 24 hour  Intake 2650 ml  Output -  Net 2650 ml   Filed Weights   02/09/17 1036  Weight: 83.3 kg (183 lb 10.3 oz)    Examination:   General: Not in pain or dyspnea,  deconditioned. Neurology: Awake and alert, non focal  E ENT: mild pallor, no icterus, oral mucosa moist Cardiovascular: No JVD. S1-S2 present, rhythmic irregular, no gallops, rubs, or murmurs. Trace non pirrint lower extremity edema. Pulmonary: decreased breath sounds bilaterally at bases, adequate air movement, no wheezing, rhonchi or rales. Gastrointestinal. Abdomen protuberant, no organomegaly, non tender, no rebound or guarding Skin. No rashes Musculoskeletal: no joint deformities   Data Reviewed: I have personally reviewed following labs and imaging studies  CBC: Recent Labs  Lab 02/08/17 1045 02/08/17 1054  WBC 14.3*  --   NEUTROABS 11.2*  --   HGB 11.1* 10.9*  HCT 35.9* 32.0*  MCV 100.8*  --   PLT 202  --    Basic Metabolic Panel: Recent Labs  Lab 02/08/17 1045 02/08/17 1054 02/08/17 1938 02/09/17 0349  NA  --  141 138 138  K  --  4.5 4.7 4.2  CL  --  103 102 106  CO2  --   --  30 27  GLUCOSE  --  109* 132* 107*  BUN  --  24* 19 20  CREATININE  --  1.00 0.95 0.94  CALCIUM  --   --  8.4* 7.7*  MG 2.0  --   --   --    GFR: Estimated Creatinine Clearance: 39 mL/min (by C-G formula based on SCr of 0.94 mg/dL). Liver Function Tests: Recent Labs  Lab 02/08/17 1938 02/09/17 0349  AST 20 15  ALT 13* 11*  ALKPHOS 78 64  BILITOT 1.1 1.1  PROT 6.7 6.0*  ALBUMIN 3.2* 2.7*   No results for input(s): LIPASE, AMYLASE in the last 168 hours. No results for input(s): AMMONIA in the last 168 hours. Coagulation Profile: No results for input(s): INR, PROTIME in the last 168 hours. Cardiac Enzymes: Recent Labs  Lab 02/09/17 0349 02/09/17 0710  TROPONINI 0.03* <0.03   BNP (last 3 results) No results for input(s): PROBNP in the last 8760 hours. HbA1C: No results for input(s): HGBA1C in the last 72 hours. CBG: No results for input(s): GLUCAP in the last 168 hours. Lipid Profile: Recent Labs    02/09/17 0349  CHOL 159  HDL 49  LDLCALC 96  TRIG 69  CHOLHDL  3.2   Thyroid Function Tests: No results for input(s): TSH, T4TOTAL, FREET4, T3FREE, THYROIDAB in the last 72 hours. Anemia Panel: No results for input(s): VITAMINB12, FOLATE, FERRITIN, TIBC, IRON, RETICCTPCT in the last 72 hours.    Radiology Studies: I have reviewed all of the imaging during this hospital visit personally     Scheduled Meds: . allopurinol  100 mg Oral Daily  . aspirin EC  325 mg Oral Daily  . atorvastatin  80 mg Oral q1800  . calcium gluconate  1 g Intravenous Once  . carvedilol  1.5625 mg Oral BID WC  . docusate  100 mg Oral Daily  . enoxaparin (LOVENOX) injection  40 mg Subcutaneous Q24H  . lactose free nutrition  237 mL Oral Daily  .  morphine injection  2 mg Intravenous Once  . pantoprazole  40 mg Oral BID  . polyethylene glycol  17 g Oral Daily  . polyvinyl alcohol  2  drop Both Eyes BID  . sertraline  25 mg Oral Daily  . sodium chloride flush  3 mL Intravenous Q12H  . cyanocobalamin  1,000 mcg Oral Daily   Continuous Infusions: . sodium chloride    . piperacillin-tazobactam (ZOSYN)  IV Stopped (02/09/17 0810)  . vancomycin       LOS: 1 day        Coralie Keens, MD [facility] Pager [phone]

## 2017-02-09 NOTE — Plan of Care (Signed)
Continue current care plan 

## 2017-02-09 NOTE — Consult Note (Signed)
Advanced Heart Failure Team Consult Note   Primary Physician: Primary Cardiologist:    Reason for Consultation: chronic HFrEF s/p ICD, CAD, in setting of acute sepsis and RUQ pain  HPI:    BREIANA Henry is seen today for evaluation of HFrEF and CAD at the request of Dr. Pearson Grippe.   83 yo female with PMH of Dementia, CAD 2004 EF 45-50% stent placed in second OM circumflex w/improvement of SOB, 2012 LHC showing EF of 65% borderline disease in LAD, 80% stenosis of OM2, was also found to have HFrEF in 2007, pt also has h/o NICM with EF 20% and symptomatic bradycardia a BIV pacer was placed which improved EF (ICD now off due to inappropriate shocks) symptomatic, LBBB,  PE, Diverticulitis, PUD presents with epigastric/RUQ pain with vitals concerning for sepsis with source thought to be UTI vs abdominal source.  She is troponin neg x3 her ECG shows LBBB which is chronic and sinus rhythm.  Pt had CTA of chest abd and pelvis done which showed cholelithiasis, small bilateral pleural effusions with atelectasis, coronary arteriosclerosis in multiple vessels.  Urinalysis not suggestive of infection here but there is mention in primary team's note of one at pts facility that was suggestive of infection.  RUQ US showed cholelithiasis no duct dilitation but could not rule out cholecystitis.  Patient sent for HIDA scan this am.  Patient was started on vanc zosyn.    About 2 weeks ago the patient was having increased lower extremity edema and her lasix was increased from 10mg  daily to 40mg  daily with good result.  She had no associated SOB or chest pain.     Echo: no ECHO report available, Bedside ECHO LV EF 25%, normal RV systolic function  however mention of EF of 20-25% before BIV pacing, also of 40-45% in 2012 on Dr. Lubertha Basque note and cath lab report of 65% that same year which were after BIV pacing I believe.   Review of Systems: [y] = yes, [ ]  = no   General: Weight gain [ ] ; Weight loss [ ] ;  Anorexia [ ] ; Fatigue [ ] ; Fever [ ] ; Chills [ ] ; Weakness [ ]   Cardiac: Chest pain/pressure [ ] ; Resting SOB [ ] ; Exertional SOB [ ] ; Orthopnea [ ] ; Pedal Edema [ ] ; Palpitations [ ] ; Syncope [ ] ; Presyncope [ ] ; Paroxysmal nocturnal dyspnea[ ]   Pulmonary: Cough [ ] ; Wheezing[ ] ; Hemoptysis[ ] ; Sputum [ ] ; Snoring [ ]   GI: Vomiting[ ] ; Dysphagia[ ] ; Melena[ ] ; Hematochezia [ ] ; Heartburn[ ] ; Abdominal pain [ ] ; Constipation [ ] ; Diarrhea [ ] ; BRBPR [ ]   GU: Hematuria[ ] ; Dysuria [ ] ; Nocturia[ ]   Vascular: Pain in legs with walking [ ] ; Pain in feet with lying flat [ ] ; Non-healing sores [ ] ; Stroke [ ] ; TIA [ ] ; Slurred speech [ ] ;  Neuro: Headaches[ ] ; Vertigo[ ] ; Seizures[ ] ; Paresthesias[ ] ;Blurred vision [ ] ; Diplopia [ ] ; Vision changes [ ]   Ortho/Skin: Arthritis [ ] ; Joint pain [ ] ; Muscle pain [ ] ; Joint swelling [ ] ; Back Pain [ ] ; Rash [ ]   Psych: Depression[ ] ; Anxiety[ ]   Heme: Bleeding problems [ ] ; Clotting disorders [ ] ; Anemia [ ]   Endocrine: Diabetes [ ] ; Thyroid dysfunction[ ]   Home Medications Prior to Admission medications   Medication Sig Start Date End Date Taking? Authorizing Provider  acetaminophen (TYLENOL) 325 MG tablet Take 650 mg by mouth every 6 (six) hours as needed for mild pain.  Yes [provider]  acetaminophen (TYLENOL) 500 MG tablet Take 500 mg by mouth 3 (three) times daily.   Yes [provider]  albuterol (PROVENTIL HFA;VENTOLIN HFA) 108 (90 Base) MCG/ACT inhaler Inhale 2 puffs into the lungs every 6 (six) hours as needed for wheezing or shortness of breath.   Yes [provider]  allopurinol (ZYLOPRIM) 100 MG tablet Take 100 mg by mouth daily.   Yes [provider]  Carboxymethylcellul-Glycerin (OPTIVE) 0.5-0.9 % SOLN Apply 2 drops to eye 2 (two) times daily.   Yes [provider]  carvedilol (COREG) 3.125 MG tablet Take 1.5625 mg by mouth 2 (two) times daily with a meal.    Yes [provider]    cyanocobalamin 1000 MCG tablet Take 1,000 mcg by mouth daily.    Yes [provider]  docusate (COLACE) 50 MG/5ML liquid Take 100 mg by mouth daily.   Yes [provider]  furosemide (LASIX) 40 MG tablet Take 40 mg by mouth every other day. Alternate with 20mg  lasix   Yes [provider]  ipratropium-albuterol (DUONEB) 0.5-2.5 (3) MG/3ML SOLN Take 3 mLs by nebulization every 6 (six) hours as needed (wheezing).    Yes [provider]  lactose free nutrition (BOOST PLUS) LIQD Take 237 mLs daily by mouth.    Yes [provider]  Morphine Sulfate (MORPHINE CONCENTRATE) 10 mg / 0.5 ml concentrated solution Take 5 mg by mouth once.   Yes [provider]  nitroGLYCERIN (NITROSTAT) 0.4 MG SL tablet Place 0.4 mg under the tongue every 5 (five) minutes as needed for chest pain.   Yes [provider]  omeprazole (PRILOSEC OTC) 20 MG tablet Take 20 mg by mouth 2 (two) times daily.   Yes [provider]  polyethylene glycol (MIRALAX / GLYCOLAX) packet Take 17 g by mouth daily. Hold for loose stools   Yes [provider]  potassium chloride SA (K-DUR,KLOR-CON) 20 MEQ tablet Take 20 mEq by mouth daily.   Yes [provider]  ranitidine (ZANTAC) 150 MG tablet Take 150 mg by mouth at bedtime.   Yes [provider]  sertraline (ZOLOFT) 25 MG tablet Take 25 mg by mouth daily.   Yes [provider]    Past Medical History: Past Medical History:  Diagnosis Date  . CAD (coronary artery disease)   . Chronic low back pain   . Contusion of foot, right 05/15/12  . Diverticulitis   . Dyslipidemia   . H/O: hysterectomy   . HTN (hypertension)   . Memory disturbance 05/2012   MMSE 26/30, intact Clock test  . Nonischemic cardiomyopathy (HCC)   . Osteoporosis   . Pulmonary embolism (HCC)   . Right knee pain 05/15/12  . Spinal stenosis   . Ulcer disease   . Weight loss     Past Surgical History: Past  Surgical History:  Procedure Laterality Date  . APPENDECTOMY  2010  . BACK SURGERY    . BIV ICD GENERTAOR CHANGE OUT N/A 05/24/2011   Procedure: BIV ICD GENERTAOR CHANGE OUT;  Surgeon: [name], MD;  Location: [facility] CATH LAB;  Service: Cardiovascular;  Laterality: N/A;  . CATARACT EXTRACTION    . COLON RESECTION  2005  . CORONARY ANGIOPLASTY WITH STENT PLACEMENT  01/2003  . HEMICOLECTOMY    . HIP ARTHROPLASTY Left 04/05/2014   Procedure: ARTHROPLASTY BIPOLAR HIP;  Surgeon: Shelda Pal, MD;  Location: [facility] ORS;  Service: Orthopedics;  Laterality: Left;  .  left shoulder replacement  1991   Dr. Fannie KneeSue  . ORIF HIP FRACTURE Left 09/30/2016   Procedure: OPEN REDUCTION INTERNAL FIXATION PERIPROSTHETIC HIP FRACTURE;  Surgeon: Ollen GrossAluisio, Frank, MD;  Location: [facility] ORS;  Service: Orthopedics;  Laterality: Left;  . right shoulder repair  1985   Dr. Fannie KneeSue  . rotator cuff debridement  1999   Dr. Despina HickAlusio    Family History: Family History  Problem Relation Age of Onset  . Coronary artery disease Sister   . Heart attack Father [id number]  . Heart attack Mother   . Other Mother        abdominal blockage    Social History: Social History   Socioeconomic History  . Marital status: Widowed    Spouse name: None  . Number of children: None  . Years of education: None  . Highest education level: None  Social Needs  . Financial resource strain: None  . Food insecurity - worry: None  . Food insecurity - inability: None  . Transportation needs - medical: None  . Transportation needs - non-medical: None  Occupational History  . None  Tobacco Use  . Smoking status: Never Smoker  . Smokeless tobacco: Never Used  Substance and Sexual Activity  . Alcohol use: No  . Drug use: No  . Sexual activity: None  Other Topics Concern  . None  Social History Narrative  . None    Allergies:  Allergies  Allergen Reactions  . Arthrotec [Diclofenac-Misoprostol]   . Ibuprofen   . Lactose Intolerance (Gi)   .  Milk-Related Compounds Nausea And Vomiting    Cheese  . Other     Grass and ragweed   . Oxycontin [Oxycodone Hcl]   . Pollen Extract     Objective:    Vital Signs:   Temp:  [99.4 F (37.4 C)-100.5 F (38.1 C)] 100.5 F (38.1 C) (11/23 1833) Pulse Rate:  [68-103] 71 (11/24 0745) Resp:  [13-29] 15 (11/24 0745) BP: (88-141)/(50-89) 108/64 (11/24 0745) SpO2:  [88 %-100 %] 100 % (11/24 0745)    Weight change: There were no vitals filed for this visit.  Intake/Output:   Intake/Output Summary (Last 24 hours) at 02/09/2017 0844 Last data filed at 02/09/2017 0810 Gross per 24 hour  Intake 2650 ml  Output -  Net 2650 ml      Physical Exam    General:  Elderly woman. Lying in bed No resp difficulty HEENT: normal Neck: supple. JVP 10cm . Carotids 2+ bilat; no bruits. No lymphadenopathy or thyromegaly appreciated. Cor: PMI laterally displaced. Regular rate & rhythm. No rubs, 2/6 TR heard. Lungs: clear other than LLL rales Abdomen: soft, nontender, nondistended. No hepatosplenomegaly. No bruits or masses. Good bowel sounds. Extremities: no cyanosis, clubbing, rash, does have 1+ edema bilateral LE Neuro: alert & oriented, cranial nerves grossly intact. moves all 4 extremities w/o difficulty. Affect pleasant   Telemetry   Sinus rhythm, LBBB  EKG    Sinus rhythm, chronic LBBB  Labs   Basic Metabolic Panel: Recent Labs  Lab 02/08/17 1045 02/08/17 1054 02/08/17 1938 02/09/17 0349  NA  --  141 138 138  K  --  4.5 4.7 4.2  CL  --  103 102 106  CO2  --   --  30 27  GLUCOSE  --  109* 132* 107*  BUN  --  24* 19 20  CREATININE  --  1.00 0.95 0.94  CALCIUM  --   --  8.4* 7.7*  MG 2.0  --   --   --  Liver Function Tests: Recent Labs  Lab 02/08/17 1938 02/09/17 0349  AST 20 15  ALT 13* 11*  ALKPHOS 78 64  BILITOT 1.1 1.1  PROT 6.7 6.0*  ALBUMIN 3.2* 2.7*   No results for input(s): LIPASE, AMYLASE in the last 168 hours. No results for input(s): AMMONIA in  the last 168 hours.  CBC: Recent Labs  Lab 02/08/17 1045 02/08/17 1054  WBC 14.3*  --   NEUTROABS 11.2*  --   HGB 11.1* 10.9*  HCT 35.9* 32.0*  MCV 100.8*  --   PLT 202  --     Cardiac Enzymes: Recent Labs  Lab 02/09/17 0349 02/09/17 0710  TROPONINI 0.03* <0.03    BNP: BNP (last 3 results) No results for input(s): BNP in the last 8760 hours.  ProBNP (last 3 results) No results for input(s): PROBNP in the last 8760 hours.   CBG: No results for input(s): GLUCAP in the last 168 hours.  Coagulation Studies: No results for input(s): LABPROT, INR in the last 72 hours.   Imaging   Dg Chest Port 1 View  Result Date: 02/08/2017 CLINICAL DATA:  Chest pain and shortness of Breath EXAM: PORTABLE CHEST 1 VIEW COMPARISON:  04/04/2014 FINDINGS: Cardiac shadow is mildly enlarged. Large hiatal hernia is seen. Defibrillator is again noted and stable. The lungs are well aerated bilaterally. Persistent thickening of the minor fissure on the right is noted consistent with scarring. No focal infiltrate is seen. IMPRESSION: Large hiatal hernia. Chronic scarring in the right mid lung. Electronically Signed   By: Alcide Clever M.D.   On: 02/08/2017 12:04   Ct Angio Chest/abd/pel For Dissection W And/or Wo Contrast  Result Date: 02/08/2017 CLINICAL DATA:  Dyspnea x3 days. EXAM: CT ANGIOGRAPHY CHEST, ABDOMEN AND PELVIS TECHNIQUE: Multidetector CT imaging through the chest, abdomen and pelvis was performed using the standard protocol during bolus administration of intravenous contrast. Multiplanar reconstructed images and MIPs were obtained and reviewed to evaluate the vascular anatomy. CONTRAST:  ISOVUE-370 IOPAMIDOL (ISOVUE-370) INJECTION 76% COMPARISON:  Same day CXR.  Chest CT from 02/06/2011. FINDINGS: CTA CHEST FINDINGS Cardiovascular: Stable cardiomegaly with ICD device along the left anterior chest wall and leads in the right atrium coronary sinus and right ventricle. No pericardial  effusion. Left main and three-vessel coronary arteriosclerosis. No large central pulmonary embolus. Mediastinum/Nodes: Mild atherosclerosis of the thoracic aorta at the arch and along the descending portion. No aneurysm or dissection. No mediastinal adenopathy. Patent trachea and mainstem bronchi. Large hiatal hernia. Lungs/Pleura: Small bilateral pleural effusions adjacent to the large hiatal hernia. Areas of compressive atelectasis are noted at each lung base with dependent atelectasis along the posterior aspect of both lower lobes. Respiratory motion artifacts limit assessment. Musculoskeletal: Thoracic spondylosis with kyphosis attributable to multilevel degenerative disc disease. No acute nor suspicious osseous abnormality. Left shoulder arthroplasty. Chronic posterior left lower rib fractures. Review of the MIP images confirms the above findings. CTA ABDOMEN AND PELVIS FINDINGS VASCULAR Aorta: No aneurysm or dissection. Moderate atherosclerosis. No significant stenosis. Celiac: Minimal atherosclerosis at the origin. The celiac axis and branch vessels are patent without significant stenosis, aneurysm or dissection. SMA: Atherosclerosis at the origin of the SMA without significant stenosis. No occlusion, dissection or aneurysm. Renals: Atherosclerosis of the origins of both renal arteries without significant luminal narrowing, fibromuscular dysplasia, aneurysm or dissection. IMA: Patent. Inflow: Patent without evidence of aneurysm, dissection, vasculitis or significant stenosis. Mild atherosclerosis of the right common and both internal iliac arteries. Veins: No obvious venous  abnormality within the limitations of this arterial phase study. Review of the MIP images confirms the above findings. NON-VASCULAR Hepatobiliary: No focal liver abnormality is seen. Tiny layering calculi within the physiologically distended gallbladder. No mural thickening or pericholecystic fluid. Pancreas: Normal without ductal  dilatation or enhancing mass. Spleen: No splenomegaly or mass. Adrenals/Urinary Tract: Bilateral renal cortical thinning with right interpolar renal cyst measuring 2.9 cm. Mild thickening of the adrenal glands without focal mass. No nephrolithiasis nor hydroureteronephrosis. The urinary bladder is partially obscured by the patient's hip arthroplasty on the left. Stomach/Bowel: Large hiatal hernia as previously mentioned. No bowel obstruction or inflammation. Partial colectomy. Lymphatic: No lymphadenopathy. Reproductive: The lower pelvis is obscured by the patient's left hip arthroplasty. The uterus is either atrophic or surgically absent. No adnexal mass. Other: No free air free fluid. Musculoskeletal: Old posttraumatic deformity of the pubic rami with healing. Dextroscoliosis of the upper thoracic spine. Osteoarthritis of the native right hip. Lumbar spondylosis. Review of the MIP images confirms the above findings. IMPRESSION: 1. Small bilateral pleural effusions with compressive atelectasis adjacent to a large known hiatal hernia containing much of the stomach. No active pulmonary disease. 2. Minimal thoracic aortic atherosclerosis without aneurysmal dilatation or evidence of dissection. 3. Left main and three-vessel coronary arteriosclerosis. 4. Ectasia of the abdominal aorta with patent branch vessels. Minimal atherosclerosis at the origins of the branch vessels as above described without significant luminal narrowing. 5. Thoracolumbar spondylosis with left hip and shoulder arthroplasties. Lumbar dextroscoliosis. Chronic bilateral pubic rami fractures. 6. 2.9 cm right interpolar renal cysts with mild cortical thinning of both kidneys. No obstructive uropathy. 7. Uncomplicated appearing cholelithiasis. Electronically Signed   By: Tollie Eth M.D.   On: 02/08/2017 18:02   US Abdomen Limited Ruq  Result Date: 02/08/2017 CLINICAL DATA:  Right upper quadrant abdominal pain. EXAM: ULTRASOUND ABDOMEN LIMITED  RIGHT UPPER QUADRANT COMPARISON:  Ultrasound of May 04, 2015. FINDINGS: Gallbladder: 6 mm gallstone is noted. No gallbladder wall thickening or pericholecystic fluid is noted. No sonographic Murphy's sign is noted. Common bile duct: Diameter: 5 mm which is within normal limits. Liver: 8 mm cyst is noted in left hepatic lobe. Within normal limits in parenchymal echogenicity. Portal vein is patent on color Doppler imaging with normal direction of blood flow towards the liver. IMPRESSION: Cholelithiasis without definite evidence of cholecystitis. Small left hepatic cyst. Electronically Signed   By: Lupita Raider, M.D.   On: 02/08/2017 20:43      Medications:     Current Medications: . allopurinol  100 mg Oral Daily  . aspirin EC  325 mg Oral Daily  . atorvastatin  80 mg Oral q1800  . calcium gluconate  1 g Intravenous Once  . carvedilol  1.5625 mg Oral BID WC  . docusate  100 mg Oral Daily  . enoxaparin (LOVENOX) injection  40 mg Subcutaneous Q24H  . lactose free nutrition  237 mL Oral Daily  .  morphine injection  2 mg Intravenous Once  . pantoprazole  40 mg Oral BID  . polyethylene glycol  17 g Oral Daily  . polyvinyl alcohol  2 drop Both Eyes BID  . sertraline  25 mg Oral Daily  . sodium chloride flush  3 mL Intravenous Q12H  . cyanocobalamin  1,000 mcg Oral Daily     Infusions: . sodium chloride    . piperacillin-tazobactam (ZOSYN)  IV Stopped (02/09/17 0810)  . vancomycin         Patient Profile   83  yo female w/ chronic HFrEF s/p ICD, CAD, in setting of acute sepsis and RUQ pain.  Not short of breath, no chest pain, some increased LE edema over the last 2 weeks worsened with IV abx and NS given here.    Assessment/Plan   Echo: no ECHO report available, Bedside ECHO today LV EF 25%, normal RV systolic function  however mention of EF of 20-25% before BIV pacing, also of 40-45% in 2012 on Dr. Lubertha Basque note and cath lab report of 65% that same year which were after  BIV pacing I believe.   1. HFrEF: acute on chronic pt presented with LE edema, not short of breath, Bedside ECHO LV EF 25%, normal RV systolic function -BNP -ECHO -IV lasix 40mg  once  -continue to diurese as BP allows, until euvolemic    2. Chest pain/ CAD: troponin neg x3, ECG neg for ACS has chronic LBBB -Troponin negative -Pt started back on 1.5625mg  BID of carvedilol  -can uptitrate to home dose as sepsis/low bps resolve   3. Sepsis: Not sure of source, current thinking is UTI vs abdominal pt had fever of 100.5, BP in 90's over 50's, white count 14k -received IV vanc/zosyn -had HIDA scan this am -management per primary  Length of Stay: 1  Thornell Mule, MD  02/09/2017, 8:44 AM  Advanced Heart Failure Team Pager [phone] (M-F; 7a - 4p)  Please contact [facility] for night-coverage after hours (4p -7a ) and weekends on [email or web address]  Patient seen and examined with the above-signed Advanced Practice Provider and/or Housestaff. I personally reviewed laboratory data, imaging studies and relevant notes. I independently examined the patient and formulated the important aspects of the plan. I have edited the note to reflect any of my changes or salient points. I have personally discussed the plan with the patient and/or family.  83 y/o woman with h/o mixed nonischemic/ischemic CM with EF 20-25% in the past. S/p previous LCX stent and BIVICD. Since BivICD placed EF improved and was normal on last check in 2012. Cath at that time also with non-obstructive CAD.Marland Kitchen   Lives at [facility] and struggles with dementia and poor mobility.   Admitted with chest and abdominal pain. Denies any CP now. Felt to have cholelithaisis +/- sepsis and has received 3L of fluid.   Troponins all negative. I did bedside echo EF ~25%.   CP does not appear ischemic and she does not need further ischemic w/u. EF is back down and she is volume overloaded on exam. Would give 1 dose IV lasix. Does not seem  to be LV pacing. Will ask EP to interrogate ICD.   Otherwise we will sign off. Please call with questions.   Arvilla Meres, MD  1:15 PM

## 2017-02-10 DIAGNOSIS — I5023 Acute on chronic systolic (congestive) heart failure: Secondary | ICD-10-CM

## 2017-02-10 DIAGNOSIS — I10 Essential (primary) hypertension: Secondary | ICD-10-CM

## 2017-02-10 LAB — CBC WITH DIFFERENTIAL/PLATELET
BASOS PCT: 1 %
Basophils Absolute: 0 10*3/uL (ref 0.0–0.1)
Eosinophils Absolute: 0.3 10*3/uL (ref 0.0–0.7)
Eosinophils Relative: 3 %
HEMATOCRIT: 28 % — AB (ref 36.0–46.0)
HEMOGLOBIN: 9.3 g/dL — AB (ref 12.0–15.0)
Lymphocytes Relative: 14 %
Lymphs Abs: 1.1 10*3/uL (ref 0.7–4.0)
MCH: 35.1 pg — ABNORMAL HIGH (ref 26.0–34.0)
MCHC: 33.2 g/dL (ref 30.0–36.0)
MCV: 105.7 fL — ABNORMAL HIGH (ref 78.0–100.0)
Monocytes Absolute: 0.8 10*3/uL (ref 0.1–1.0)
Monocytes Relative: 9 %
NEUTROS ABS: 6.2 10*3/uL (ref 1.7–7.7)
NEUTROS PCT: 74 %
Platelets: 182 10*3/uL (ref 150–400)
RBC: 2.65 MIL/uL — AB (ref 3.87–5.11)
RDW: 15.6 % — ABNORMAL HIGH (ref 11.5–15.5)
WBC: 8.4 10*3/uL (ref 4.0–10.5)

## 2017-02-10 LAB — BASIC METABOLIC PANEL
ANION GAP: 8 (ref 5–15)
BUN: 23 mg/dL — ABNORMAL HIGH (ref 6–20)
CHLORIDE: 105 mmol/L (ref 101–111)
CO2: 27 mmol/L (ref 22–32)
Calcium: 8.2 mg/dL — ABNORMAL LOW (ref 8.9–10.3)
Creatinine, Ser: 1.15 mg/dL — ABNORMAL HIGH (ref 0.44–1.00)
GFR calc non Af Amer: 39 mL/min — ABNORMAL LOW (ref >60)
GFR, EST AFRICAN AMERICAN: 46 mL/min — AB (ref >60)
Glucose, Bld: 103 mg/dL — ABNORMAL HIGH (ref 65–99)
Potassium: 3.9 mmol/L (ref 3.5–5.1)
SODIUM: 140 mmol/L (ref 135–145)

## 2017-02-10 MED ORDER — LOSARTAN POTASSIUM 25 MG PO TABS
12.5000 mg | ORAL_TABLET | Freq: Every day | ORAL | Status: DC
  Administered 2017-02-10: 12.5 mg via ORAL
  Filled 2017-02-10: qty 1

## 2017-02-10 MED ORDER — FUROSEMIDE 40 MG PO TABS
40.0000 mg | ORAL_TABLET | Freq: Every day | ORAL | Status: DC
  Administered 2017-02-10 – 2017-02-11 (×2): 40 mg via ORAL
  Filled 2017-02-10 (×3): qty 1

## 2017-02-10 NOTE — Progress Notes (Addendum)
New pt got transferred from 2c with a primary diagnosis of chest pain.. Pt brought to the floor in stable condition. Vitals taken, pt is alert to self only. Patient is pleasantly confused, private sitter in a bed side, Foley catheter is in place for acute urinary retention, will page MD regarding foley can be out or not this time, pt has a BLUE and LE mainly UE right hand ecchymosis, in high fall risk, mat is in both side,  No any complain of CP and SOB this point, oxygen started @2l /min Will continue to monitor

## 2017-02-10 NOTE — Progress Notes (Signed)
Called report to RN at [facility] for report, also called pt's daughter in law Macon Large to let her know which floor and room pt is being transferred to.

## 2017-02-10 NOTE — Progress Notes (Signed)
PROGRESS NOTE    BELENDA ALVIAR  ZOX:[id number] DOB: 07-Aug-1920 DOA: 02/08/2017 PCP: [name], DO    Brief Narrative:  83 year old female who presented for chest pain. She does have the significant past medical history of pulmonary embolism, hypertension, coronary artery disease, dyslipidemia. Patient developed sharp chest pain for about 10 hours, substernal, non-radiating. On initial physical examination her blood pressure was 98/50, heart rate 85 bpm, temperature 100.5, respiratory rate 17, oxygen saturation 97%. His lungs were clear to auscultation bilaterally, no wheezing, rales or rhonchi, heart S1-S2 present regular and rhythmic, the abdomen was soft nontender, no lower extremity edema. Sodium 138, potassium 4.7, chloride 102, bicarbonate 30, glucose 132, BUN 19, creatinine 0.95, AST 20, ALT 13, troponin 0.03, white cell count 14.3, hemoglobin 11.1, hematocrit 35.9, platelets 202, d-dimer 2.69, urine analysis negative for infection, specific gravity greater than 1.046. Chest x-ray, right rotation, hypoinflation, no infiltrates, effusions or signs of pneumothorax. Pacemaker in place, with right atrial and ventricular leads in place. CT of the chest and abdomen with no acute findings, beside small bilateral pleural effusions. Positive cholelithiasis. Ultrasound of the abdomen with cholelithiasis but no signs of cholecystitis. EKG sinus rhythm rate 92 bpm, left axis deviation, left bundle-branch block, T-wave inversions in V5 and V6, poor R-wave progression, positive premature atrial complex. (Chronic changes compared to July 2018).    Patient was admitted to the hospital with working diagnosis of chest pain and fever rule out sepsis.   Assessment & Plan:   Principal Problem:   Fever Active Problems:   Acute on chronic systolic (congestive) heart failure (HCC)   Sepsis (HCC)   Chest pain   Abdominal pain   Biventricular cardiac pacemaker in situ   LBBB (left bundle branch  block)   1. Fever to rule out sepsis in the setting of gallstones/ sepsis has been ruled out. Patient tolerating well off antibiotics, patient has remained afebrile, no leukocytosis. Sepsis has been ruled out, will transfer patient to telemetry and plan to discharge to snf in am.    2. Ischemic cardiomyopathy with systolic heart failure, ejection fraction 20% and sinus node dysfunction status post pacemaker (DDD) with acute exacerbation and episodic VT. Had IV furosemide for diuresis, BiV was reprogrammed, will continue carvedilol, losartan added to improve heart failure regimen. Will continue telemetry monitoring for 24 hours, if no arrhythmias will plan to discharge to SNF in am.   3. Hypertension. Blood pressure 89-113, systolic patient had losartan added this am, will continue blood pressure monitoring, patient with significant reduction on LV EF.   4. Coronary artery disease. Continue asa and statin therapy, patient is chest pain free.   5. Dyslipidemia. On statin therapy, with good toleration.   DVT prophylaxis: enoxaparin  Code Status: DNR Family Communication:  Disposition Plan:    Consultants:   Heart failure  General surgery  Procedures:     Antimicrobials:   Vancomycin discontinued.   Zosyn discontinued.    Subjective: Patient feeling better, dyspnea has been improving, yesterday had 2 episodes of VT about 6 beats, asymptomatic. This am with no abdominal or chest pain.   Objective: Vitals:   02/09/17 2003 02/09/17 2326 02/10/17 0402 02/10/17 0810  BP: 115/64 101/61 103/61 113/60  Pulse: 83 85 82 87  Resp: 20 17 16 16   Temp: 99.3 F (37.4 C) 98.7 F (37.1 C) 98.8 F (37.1 C) 99.3 F (37.4 C)  TempSrc: Oral Axillary Axillary Oral  SpO2: 98% 98% 96% 97%  Weight:   83.1  kg (183 lb 3.2 oz)   Height:        Intake/Output Summary (Last 24 hours) at 02/10/2017 1044 Last data filed at 02/10/2017 0900 Gross per 24 hour  Intake 120 ml  Output  1325 ml  Net -1205 ml   Filed Weights   02/09/17 1036 02/10/17 0402  Weight: 83.3 kg (183 lb 10.3 oz) 83.1 kg (183 lb 3.2 oz)    Examination:   General: Not in pain or dyspnea, deconditioned Neurology: Awake and alert, non focal  E ENT: no pallor, no icterus, oral mucosa moist Cardiovascular: No JVD. S1-S2 present, rhythmic, no gallops, rubs, or murmurs. Trace lower extremity edema. Pulmonary: decreased breath sounds bilaterally at bases, adequate air movement, no wheezing, rhonchi or rales. Gastrointestinal. Abdomen protuberant, no organomegaly, non tender, no rebound or guarding Skin. No rashes Musculoskeletal: no joint deformities     Data Reviewed: I have personally reviewed following labs and imaging studies  CBC: Recent Labs  Lab 02/08/17 1045 02/08/17 1054 02/09/17 1622 02/10/17 0318  WBC 14.3*  --  8.9 8.4  NEUTROABS 11.2*  --   --  6.2  HGB 11.1* 10.9* 9.6* 9.3*  HCT 35.9* 32.0* 31.5* 28.0*  MCV 100.8*  --  103.6* 105.7*  PLT 202  --  181 182   Basic Metabolic Panel: Recent Labs  Lab 02/08/17 1045 02/08/17 1054 02/08/17 1938 02/09/17 0349 02/10/17 0318  NA  --  141 138 138 140  K  --  4.5 4.7 4.2 3.9  CL  --  103 102 106 105  CO2  --   --  30 27 27   GLUCOSE  --  109* 132* 107* 103*  BUN  --  24* 19 20 23*  CREATININE  --  1.00 0.95 0.94 1.15*  CALCIUM  --   --  8.4* 7.7* 8.2*  MG 2.0  --   --   --   --    GFR: Estimated Creatinine Clearance: 31.8 mL/min (A) (by C-G formula based on SCr of 1.15 mg/dL (H)). Liver Function Tests: Recent Labs  Lab 02/08/17 1938 02/09/17 0349  AST 20 15  ALT 13* 11*  ALKPHOS 78 64  BILITOT 1.1 1.1  PROT 6.7 6.0*  ALBUMIN 3.2* 2.7*   No results for input(s): LIPASE, AMYLASE in the last 168 hours. No results for input(s): AMMONIA in the last 168 hours. Coagulation Profile: No results for input(s): INR, PROTIME in the last 168 hours. Cardiac Enzymes: Recent Labs  Lab 02/09/17 0349 02/09/17 0710   TROPONINI 0.03* <0.03   BNP (last 3 results) No results for input(s): PROBNP in the last 8760 hours. HbA1C: No results for input(s): HGBA1C in the last 72 hours. CBG: No results for input(s): GLUCAP in the last 168 hours. Lipid Profile: Recent Labs    02/09/17 0349  CHOL 159  HDL 49  LDLCALC 96  TRIG 69  CHOLHDL 3.2   Thyroid Function Tests: No results for input(s): TSH, T4TOTAL, FREET4, T3FREE, THYROIDAB in the last 72 hours. Anemia Panel: No results for input(s): VITAMINB12, FOLATE, FERRITIN, TIBC, IRON, RETICCTPCT in the last 72 hours.    Radiology Studies: I have reviewed all of the imaging during this hospital visit personally     Scheduled Meds: . allopurinol  100 mg Oral Daily  . aspirin EC  325 mg Oral Daily  . atorvastatin  80 mg Oral q1800  . calcium gluconate  1 g Intravenous Once  . carvedilol  1.5625 mg Oral BID WC  .  docusate  100 mg Oral Daily  . enoxaparin (LOVENOX) injection  40 mg Subcutaneous Q24H  . lactose free nutrition  237 mL Oral Daily  . losartan  12.5 mg Oral QHS  . mouth rinse  15 mL Mouth Rinse BID  .  morphine injection  2 mg Intravenous Once  . pantoprazole  40 mg Oral BID  . polyethylene glycol  17 g Oral Daily  . polyvinyl alcohol  2 drop Both Eyes BID  . sertraline  25 mg Oral Daily  . sodium chloride flush  3 mL Intravenous Q12H  . cyanocobalamin  1,000 mcg Oral Daily   Continuous Infusions: . sodium chloride       LOS: 2 days        Ivah Girardot Annett Gulaaniel Skylin Kennerson, MD [facility] Pager [phone]

## 2017-02-10 NOTE — Progress Notes (Signed)
Advanced Heart Failure Rounding Note   Subjective:     Denies CP or SOB. HIDA scan negative. Diuresed with IV lasix yesterday. Weight unchanged but breathing better. BiV reprogrammed by EP (turned LV lead back on)    Objective:   Weight Range:  Vital Signs:   Temp:  [97.5 F (36.4 C)-99.3 F (37.4 C)] 99.3 F (37.4 C) (11/25 0810) Pulse Rate:  [75-87] 87 (11/25 0810) Resp:  [15-20] 16 (11/25 0810) BP: (101-115)/(58-67) 113/60 (11/25 0810) SpO2:  [96 %-99 %] 97 % (11/25 0810) Weight:  [83.1 kg (183 lb 3.2 oz)-83.3 kg (183 lb 10.3 oz)] 83.1 kg (183 lb 3.2 oz) (11/25 0402)    Weight change: Filed Weights   02/09/17 1036 02/10/17 0402  Weight: 83.3 kg (183 lb 10.3 oz) 83.1 kg (183 lb 3.2 oz)    Intake/Output:   Intake/Output Summary (Last 24 hours) at 02/10/2017 0845 Last data filed at 02/10/2017 0811 Gross per 24 hour  Intake -  Output 1325 ml  Net -1325 ml     Physical Exam: General:  Elderly sitting up in bed  No resp difficulty HEENT: normal Neck: supple. JVP 8-9. Carotids 2+ bilat; no bruits. No lymphadenopathy or thryomegaly appreciated. Cor: PMI laterally displaced. Regular rate & rhythm. No rubs, gallops or murmurs. Lungs: clear Abdomen: soft, nontender, nondistended. No hepatosplenomegaly. No bruits or masses. Good bowel sounds. Extremities: no cyanosis, clubbing, rash, trace edema Neuro: alert & orientedx3, cranial nerves grossly intact. moves all 4 extremities w/o difficulty. Affect pleasant  Telemetry: NSR with v pacing 80s. Personally reviewed   Labs: Basic Metabolic Panel: Recent Labs  Lab 02/08/17 1045 02/08/17 1054 02/08/17 1938 02/09/17 0349 02/10/17 0318  NA  --  141 138 138 140  K  --  4.5 4.7 4.2 3.9  CL  --  103 102 106 105  CO2  --   --  GLUCOSE  --  109* 132* 107* 103*  BUN  --  24* 19 20 23*  CREATININE  --  1.00 0.95 0.94 1.15*  CALCIUM  --   --  8.4* 7.7* 8.2*  MG 2.0  --   --   --   --     Liver Function  Tests: Recent Labs  Lab 02/08/17 1938 02/09/17 0349  AST 20 15  ALT 13* 11*  ALKPHOS 78 64  BILITOT 1.1 1.1  PROT 6.7 6.0*  ALBUMIN 3.2* 2.7*   No results for input(s): LIPASE, AMYLASE in the last 168 hours. No results for input(s): AMMONIA in the last 168 hours.  CBC: Recent Labs  Lab 02/08/17 1045 02/08/17 1054 02/09/17 1622 02/10/17 0318  WBC 14.3*  --  8.9 8.4  NEUTROABS 11.2*  --   --  6.2  HGB 11.1* 10.9* 9.6* 9.3*  HCT 35.9* 32.0* 31.5* 28.0*  MCV 100.8*  --  103.6* 105.7*  PLT 202  --  181 182    Cardiac Enzymes: Recent Labs  Lab 02/09/17 0349 02/09/17 0710  TROPONINI 0.03* <0.03    BNP: BNP (last 3 results) No results for input(s): BNP in the last 8760 hours.  ProBNP (last 3 results) No results for input(s): PROBNP in the last 8760 hours.    Other results:  Imaging: Nm Hepatobiliary Liver Func  Result Date: 02/09/2017 CLINICAL DATA:  83 year old with right upper quadrant pain and suspect cholecystitis. EXAM: NUCLEAR MEDICINE HEPATOBILIARY IMAGING TECHNIQUE: Sequential images of the abdomen were obtained out to 60 minutes following intravenous  administration of radiopharmaceutical. RADIOPHARMACEUTICALS:  5.3 mCi Tc-51m  Choletec IV COMPARISON:  Ultrasound 02/08/2017 FINDINGS: Prompt uptake and biliary excretion of activity by the liver is seen. Gallbladder activity is visualized, consistent with patency of cystic duct. Biliary activity passes into small bowel, consistent with patent common bile duct. IMPRESSION: Normal hepatobiliary examination.  The cystic duct is patent. Electronically Signed   By: Richarda Overlie M.D.   On: 02/09/2017 10:24   Dg Chest Port 1 View  Result Date: 02/08/2017 CLINICAL DATA:  Chest pain and shortness of Breath EXAM: PORTABLE CHEST 1 VIEW COMPARISON:  04/04/2014 FINDINGS: Cardiac shadow is mildly enlarged. Large hiatal hernia is seen. Defibrillator is again noted and stable. The lungs are well aerated bilaterally. Persistent  thickening of the minor fissure on the right is noted consistent with scarring. No focal infiltrate is seen. IMPRESSION: Large hiatal hernia. Chronic scarring in the right mid lung. Electronically Signed   By: Alcide Clever M.D.   On: 02/08/2017 12:04   Ct Angio Chest/abd/pel For Dissection W And/or Wo Contrast  Result Date: 02/08/2017 CLINICAL DATA:  Dyspnea x3 days. EXAM: CT ANGIOGRAPHY CHEST, ABDOMEN AND PELVIS TECHNIQUE: Multidetector CT imaging through the chest, abdomen and pelvis was performed using the standard protocol during bolus administration of intravenous contrast. Multiplanar reconstructed images and MIPs were obtained and reviewed to evaluate the vascular anatomy. CONTRAST:  ISOVUE-370 IOPAMIDOL (ISOVUE-370) INJECTION 76% COMPARISON:  Same day CXR.  Chest CT from 02/06/2011. FINDINGS: CTA CHEST FINDINGS Cardiovascular: Stable cardiomegaly with ICD device along the left anterior chest wall and leads in the right atrium coronary sinus and right ventricle. No pericardial effusion. Left main and three-vessel coronary arteriosclerosis. No large central pulmonary embolus. Mediastinum/Nodes: Mild atherosclerosis of the thoracic aorta at the arch and along the descending portion. No aneurysm or dissection. No mediastinal adenopathy. Patent trachea and mainstem bronchi. Large hiatal hernia. Lungs/Pleura: Small bilateral pleural effusions adjacent to the large hiatal hernia. Areas of compressive atelectasis are noted at each lung base with dependent atelectasis along the posterior aspect of both lower lobes. Respiratory motion artifacts limit assessment. Musculoskeletal: Thoracic spondylosis with kyphosis attributable to multilevel degenerative disc disease. No acute nor suspicious osseous abnormality. Left shoulder arthroplasty. Chronic posterior left lower rib fractures. Review of the MIP images confirms the above findings. CTA ABDOMEN AND PELVIS FINDINGS VASCULAR Aorta: No aneurysm or dissection.  Moderate atherosclerosis. No significant stenosis. Celiac: Minimal atherosclerosis at the origin. The celiac axis and branch vessels are patent without significant stenosis, aneurysm or dissection. SMA: Atherosclerosis at the origin of the SMA without significant stenosis. No occlusion, dissection or aneurysm. Renals: Atherosclerosis of the origins of both renal arteries without significant luminal narrowing, fibromuscular dysplasia, aneurysm or dissection. IMA: Patent. Inflow: Patent without evidence of aneurysm, dissection, vasculitis or significant stenosis. Mild atherosclerosis of the right common and both internal iliac arteries. Veins: No obvious venous abnormality within the limitations of this arterial phase study. Review of the MIP images confirms the above findings. NON-VASCULAR Hepatobiliary: No focal liver abnormality is seen. Tiny layering calculi within the physiologically distended gallbladder. No mural thickening or pericholecystic fluid. Pancreas: Normal without ductal dilatation or enhancing mass. Spleen: No splenomegaly or mass. Adrenals/Urinary Tract: Bilateral renal cortical thinning with right interpolar renal cyst measuring 2.9 cm. Mild thickening of the adrenal glands without focal mass. No nephrolithiasis nor hydroureteronephrosis. The urinary bladder is partially obscured by the patient's hip arthroplasty on the left. Stomach/Bowel: Large hiatal hernia as previously mentioned. No bowel obstruction or inflammation.  Partial colectomy. Lymphatic: No lymphadenopathy. Reproductive: The lower pelvis is obscured by the patient's left hip arthroplasty. The uterus is either atrophic or surgically absent. No adnexal mass. Other: No free air free fluid. Musculoskeletal: Old posttraumatic deformity of the pubic rami with healing. Dextroscoliosis of the upper thoracic spine. Osteoarthritis of the native right hip. Lumbar spondylosis. Review of the MIP images confirms the above findings. IMPRESSION: 1.  Small bilateral pleural effusions with compressive atelectasis adjacent to a large known hiatal hernia containing much of the stomach. No active pulmonary disease. 2. Minimal thoracic aortic atherosclerosis without aneurysmal dilatation or evidence of dissection. 3. Left main and three-vessel coronary arteriosclerosis. 4. Ectasia of the abdominal aorta with patent branch vessels. Minimal atherosclerosis at the origins of the branch vessels as above described without significant luminal narrowing. 5. Thoracolumbar spondylosis with left hip and shoulder arthroplasties. Lumbar dextroscoliosis. Chronic bilateral pubic rami fractures. 6. 2.9 cm right interpolar renal cysts with mild cortical thinning of both kidneys. No obstructive uropathy. 7. Uncomplicated appearing cholelithiasis. Electronically Signed   By: Tollie Eth M.D.   On: 02/08/2017 18:02   US Abdomen Limited Ruq  Result Date: 02/08/2017 CLINICAL DATA:  Right upper quadrant abdominal pain. EXAM: ULTRASOUND ABDOMEN LIMITED RIGHT UPPER QUADRANT COMPARISON:  Ultrasound of May 04, 2015. FINDINGS: Gallbladder: 6 mm gallstone is noted. No gallbladder wall thickening or pericholecystic fluid is noted. No sonographic Murphy's sign is noted. Common bile duct: Diameter: 5 mm which is within normal limits. Liver: 8 mm cyst is noted in left hepatic lobe. Within normal limits in parenchymal echogenicity. Portal vein is patent on color Doppler imaging with normal direction of blood flow towards the liver. IMPRESSION: Cholelithiasis without definite evidence of cholecystitis. Small left hepatic cyst. Electronically Signed   By: Lupita Raider, M.D.   On: 02/08/2017 20:43      Medications:     Scheduled Medications: . allopurinol  100 mg Oral Daily  . aspirin EC  325 mg Oral Daily  . atorvastatin  80 mg Oral q1800  . calcium gluconate  1 g Intravenous Once  . carvedilol  1.5625 mg Oral BID WC  . docusate  100 mg Oral Daily  . enoxaparin (LOVENOX)  injection  40 mg Subcutaneous Q24H  . lactose free nutrition  237 mL Oral Daily  . mouth rinse  15 mL Mouth Rinse BID  .  morphine injection  2 mg Intravenous Once  . pantoprazole  40 mg Oral BID  . polyethylene glycol  17 g Oral Daily  . polyvinyl alcohol  2 drop Both Eyes BID  . sertraline  25 mg Oral Daily  . sodium chloride flush  3 mL Intravenous Q12H  . cyanocobalamin  1,000 mcg Oral Daily     Infusions: . sodium chloride       PRN Medications:  sodium chloride, acetaminophen **OR** acetaminophen, albuterol, ipratropium-albuterol, nitroGLYCERIN, sodium chloride flush   Assessment:    83 yo female w/ chronic HFrEF s/p ICD, CAD, in setting of acute sepsis and RUQ pain   Plan/Discussion:    1. Acute on chronic systolic HF - volume overloaded in setting of IVF resuscitation  - bedside echo yesterday EF 25% - Diuresed yesterday. Weight unchanged but breathing better this am  - BiV pacer reprogrammed yesterday by EP (LV lead turned back on) and hopefully EF will improve again.  - Resume home lasix regimen - Continue carvedilol - add losartan 12.5 qhs  2. Chest pain - doubt ischemic. No further w/u  Cardiology will sign off. Please call with questions.   Length of Stay: 2   Arvilla Meres 02/10/2017, 8:45 AM  Advanced Heart Failure Team Pager [phone] (M-F; 7a - 4p)  Please contact [facility] for night-coverage after hours (4p -7a ) and weekends on [email or web address]

## 2017-02-10 NOTE — Plan of Care (Signed)
Patient is confused

## 2017-02-10 NOTE — Progress Notes (Signed)
Subjective: Alert.  Confused.  Somewhat disoriented. Denies abdominal pain or nausea. WBC 8400.  Hemoglobin 9.3.  Hepatobiliary scan normal.  Gallbladder fills normally. This strongly suggests that she does not have acute cholecystitis, and she should not be treated as such. Antibiotics have been appropriately discontinued.  Objective: Vital signs in last 24 hours: Temp:  [97.5 F (36.4 C)-99.3 F (37.4 C)] 99.3 F (37.4 C) (11/25 0810) Pulse Rate:  [75-87] 87 (11/25 0810) Resp:  [15-20] 16 (11/25 0810) BP: (101-115)/(58-67) 113/60 (11/25 0810) SpO2:  [96 %-99 %] 97 % (11/25 0810) Weight:  [83.1 kg (183 lb 3.2 oz)-83.3 kg (183 lb 10.3 oz)] 83.1 kg (183 lb 3.2 oz) (11/25 0402)    Intake/Output from previous day: 11/24 0701 - 11/25 0700 In: 50 [IV Piggyback:50] Out: 1150 [Urine:1150] Intake/Output this shift: Total I/O In: -  Out: 175 [Urine:175]   General: Awake and alert.  Confused.  Oriented to person and the fact that she is in a hospital. Lungs: Reasonable air movement but decreased breath sounds at bases.  No wheezing Abdomen: Soft and nontender.  No organomegaly.  No mass.  No hernia   Lab Results:  Recent Labs    02/09/17 1622 02/10/17 0318  WBC 8.9 8.4  HGB 9.6* 9.3*  HCT 31.5* 28.0*  PLT 181 182   BMET Recent Labs    02/09/17 0349 02/10/17 0318  NA 138 140  K 4.2 3.9  CL 106 105  CO2 27 27  GLUCOSE 107* 103*  BUN 20 23*  CREATININE 0.94 1.15*  CALCIUM 7.7* 8.2*   PT/INR No results for input(s): LABPROT, INR in the last 72 hours. ABG No results for input(s): PHART, HCO3 in the last 72 hours.  Invalid input(s): PCO2, PO2  Studies/Results: Nm Hepatobiliary Liver Func  Result Date: 02/09/2017 CLINICAL DATA:  83 year old with right upper quadrant pain and suspect cholecystitis. EXAM: NUCLEAR MEDICINE HEPATOBILIARY IMAGING TECHNIQUE: Sequential images of the abdomen were obtained out to 60 minutes following intravenous administration of  radiopharmaceutical. RADIOPHARMACEUTICALS:  5.3 mCi Tc-4458m  Choletec IV COMPARISON:  Ultrasound 02/08/2017 FINDINGS: Prompt uptake and biliary excretion of activity by the liver is seen. Gallbladder activity is visualized, consistent with patency of cystic duct. Biliary activity passes into small bowel, consistent with patent common bile duct. IMPRESSION: Normal hepatobiliary examination.  The cystic duct is patent. Electronically Signed   By: Richarda OverlieAdam  Henn M.D.   On: 02/09/2017 10:24   Dg Chest Port 1 View  Result Date: 02/08/2017 CLINICAL DATA:  Chest pain and shortness of Breath EXAM: PORTABLE CHEST 1 VIEW COMPARISON:  04/04/2014 FINDINGS: Cardiac shadow is mildly enlarged. Large hiatal hernia is seen. Defibrillator is again noted and stable. The lungs are well aerated bilaterally. Persistent thickening of the minor fissure on the right is noted consistent with scarring. No focal infiltrate is seen. IMPRESSION: Large hiatal hernia. Chronic scarring in the right mid lung. Electronically Signed   By: Alcide CleverMark  Lukens M.D.   On: 02/08/2017 12:04   Ct Angio Chest/abd/pel For Dissection W And/or Wo Contrast  Result Date: 02/08/2017 CLINICAL DATA:  Dyspnea x3 days. EXAM: CT ANGIOGRAPHY CHEST, ABDOMEN AND PELVIS TECHNIQUE: Multidetector CT imaging through the chest, abdomen and pelvis was performed using the standard protocol during bolus administration of intravenous contrast. Multiplanar reconstructed images and MIPs were obtained and reviewed to evaluate the vascular anatomy. CONTRAST:  100mL ISOVUE-370 IOPAMIDOL (ISOVUE-370) INJECTION 76% COMPARISON:  Same day CXR.  Chest CT from 02/06/2011. FINDINGS: CTA CHEST FINDINGS Cardiovascular: Stable  cardiomegaly with ICD device along the left anterior chest wall and leads in the right atrium coronary sinus and right ventricle. No pericardial effusion. Left main and three-vessel coronary arteriosclerosis. No large central pulmonary embolus. Mediastinum/Nodes: Mild  atherosclerosis of the thoracic aorta at the arch and along the descending portion. No aneurysm or dissection. No mediastinal adenopathy. Patent trachea and mainstem bronchi. Large hiatal hernia. Lungs/Pleura: Small bilateral pleural effusions adjacent to the large hiatal hernia. Areas of compressive atelectasis are noted at each lung base with dependent atelectasis along the posterior aspect of both lower lobes. Respiratory motion artifacts limit assessment. Musculoskeletal: Thoracic spondylosis with kyphosis attributable to multilevel degenerative disc disease. No acute nor suspicious osseous abnormality. Left shoulder arthroplasty. Chronic posterior left lower rib fractures. Review of the MIP images confirms the above findings. CTA ABDOMEN AND PELVIS FINDINGS VASCULAR Aorta: No aneurysm or dissection. Moderate atherosclerosis. No significant stenosis. Celiac: Minimal atherosclerosis at the origin. The celiac axis and branch vessels are patent without significant stenosis, aneurysm or dissection. SMA: Atherosclerosis at the origin of the SMA without significant stenosis. No occlusion, dissection or aneurysm. Renals: Atherosclerosis of the origins of both renal arteries without significant luminal narrowing, fibromuscular dysplasia, aneurysm or dissection. IMA: Patent. Inflow: Patent without evidence of aneurysm, dissection, vasculitis or significant stenosis. Mild atherosclerosis of the right common and both internal iliac arteries. Veins: No obvious venous abnormality within the limitations of this arterial phase study. Review of the MIP images confirms the above findings. NON-VASCULAR Hepatobiliary: No focal liver abnormality is seen. Tiny layering calculi within the physiologically distended gallbladder. No mural thickening or pericholecystic fluid. Pancreas: Normal without ductal dilatation or enhancing mass. Spleen: No splenomegaly or mass. Adrenals/Urinary Tract: Bilateral renal cortical thinning with right  interpolar renal cyst measuring 2.9 cm. Mild thickening of the adrenal glands without focal mass. No nephrolithiasis nor hydroureteronephrosis. The urinary bladder is partially obscured by the patient's hip arthroplasty on the left. Stomach/Bowel: Large hiatal hernia as previously mentioned. No bowel obstruction or inflammation. Partial colectomy. Lymphatic: No lymphadenopathy. Reproductive: The lower pelvis is obscured by the patient's left hip arthroplasty. The uterus is either atrophic or surgically absent. No adnexal mass. Other: No free air free fluid. Musculoskeletal: Old posttraumatic deformity of the pubic rami with healing. Dextroscoliosis of the upper thoracic spine. Osteoarthritis of the native right hip. Lumbar spondylosis. Review of the MIP images confirms the above findings. IMPRESSION: 1. Small bilateral pleural effusions with compressive atelectasis adjacent to a large known hiatal hernia containing much of the stomach. No active pulmonary disease. 2. Minimal thoracic aortic atherosclerosis without aneurysmal dilatation or evidence of dissection. 3. Left main and three-vessel coronary arteriosclerosis. 4. Ectasia of the abdominal aorta with patent branch vessels. Minimal atherosclerosis at the origins of the branch vessels as above described without significant luminal narrowing. 5. Thoracolumbar spondylosis with left hip and shoulder arthroplasties. Lumbar dextroscoliosis. Chronic bilateral pubic rami fractures. 6. 2.9 cm right interpolar renal cysts with mild cortical thinning of both kidneys. No obstructive uropathy. 7. Uncomplicated appearing cholelithiasis. Electronically Signed   By: Tollie Eth M.D.   On: 02/08/2017 18:02   US Abdomen Limited Ruq  Result Date: 02/08/2017 CLINICAL DATA:  Right upper quadrant abdominal pain. EXAM: ULTRASOUND ABDOMEN LIMITED RIGHT UPPER QUADRANT COMPARISON:  Ultrasound of May 04, 2015. FINDINGS: Gallbladder: 6 mm gallstone is noted. No gallbladder wall  thickening or pericholecystic fluid is noted. No sonographic Murphy's sign is noted. Common bile duct: Diameter: 5 mm which is within normal limits. Liver: 8 mm  cyst is noted in left hepatic lobe. Within normal limits in parenchymal echogenicity. Portal vein is patent on color Doppler imaging with normal direction of blood flow towards the liver. IMPRESSION: Cholelithiasis without definite evidence of cholecystitis. Small left hepatic cyst. Electronically Signed   By: Lupita Raider, M.D.   On: 02/08/2017 20:43    Anti-infectives: Anti-infectives (From admission, onward)   Start     Dose/Rate Route Frequency Ordered Stop   02/09/17 2300  vancomycin (VANCOCIN) IVPB 750 mg/150 ml premix  Status:  Discontinued     750 mg 150 mL/hr over 60 Minutes Intravenous Every 24 hours 02/09/17 0142 02/09/17 1323   02/09/17 0600  piperacillin-tazobactam (ZOSYN) IVPB 3.375 g  Status:  Discontinued     3.375 g 12.5 mL/hr over 240 Minutes Intravenous Every 8 hours 02/09/17 0138 02/09/17 1324   02/08/17 2100  vancomycin (VANCOCIN) 1,750 mg in sodium chloride 0.9 % 500 mL IVPB     1,750 mg 250 mL/hr over 120 Minutes Intravenous  Once 02/08/17 2051 02/09/17 0024   02/08/17 2100  piperacillin-tazobactam (ZOSYN) IVPB 3.375 g     3.375 g 100 mL/hr over 30 Minutes Intravenous  Once 02/08/17 2051 02/08/17 2238      Assessment/Plan:   abdominal pain and chest pain.  Etiology not entirely clear.  Gallstones.  Asymptomatic. HIDA negative.  No evidence of acute cholecystitis  Ischemic cardiomyopathy with systolic heart failure EF 20%, pacemaker  CAD.  Enzymes normal.  No chest pain now.  Hypertension  Memory disorder-takes Zoloft.   Plan: Surgery will sign off.  Please reconsult if surgical problems identified   LOS: 2 days    Ernestene Mention 02/10/2017

## 2017-02-11 ENCOUNTER — Inpatient Hospital Stay (HOSPITAL_COMMUNITY): Payer: Medicare Other

## 2017-02-11 DIAGNOSIS — Z950 Presence of cardiac pacemaker: Secondary | ICD-10-CM

## 2017-02-11 DIAGNOSIS — R1030 Lower abdominal pain, unspecified: Secondary | ICD-10-CM

## 2017-02-11 DIAGNOSIS — R0789 Other chest pain: Secondary | ICD-10-CM

## 2017-02-11 LAB — CBC WITH DIFFERENTIAL/PLATELET
BASOS PCT: 1 %
Basophils Absolute: 0 10*3/uL (ref 0.0–0.1)
Eosinophils Absolute: 0.4 10*3/uL (ref 0.0–0.7)
Eosinophils Relative: 5 %
HCT: 29 % — ABNORMAL LOW (ref 36.0–46.0)
HEMOGLOBIN: 9.3 g/dL — AB (ref 12.0–15.0)
LYMPHS PCT: 16 %
Lymphs Abs: 1.3 10*3/uL (ref 0.7–4.0)
MCH: 33.5 pg (ref 26.0–34.0)
MCHC: 32.1 g/dL (ref 30.0–36.0)
MCV: 104.3 fL — AB (ref 78.0–100.0)
MONO ABS: 1 10*3/uL (ref 0.1–1.0)
MONOS PCT: 13 %
NEUTROS ABS: 5.3 10*3/uL (ref 1.7–7.7)
Neutrophils Relative %: 65 %
Platelets: 207 10*3/uL (ref 150–400)
RBC: 2.78 MIL/uL — ABNORMAL LOW (ref 3.87–5.11)
RDW: 15.6 % — AB (ref 11.5–15.5)
WBC: 8 10*3/uL (ref 4.0–10.5)

## 2017-02-11 LAB — BASIC METABOLIC PANEL
ANION GAP: 7 (ref 5–15)
BUN: 22 mg/dL — AB (ref 6–20)
CO2: 30 mmol/L (ref 22–32)
Calcium: 8.1 mg/dL — ABNORMAL LOW (ref 8.9–10.3)
Chloride: 103 mmol/L (ref 101–111)
Creatinine, Ser: 1.14 mg/dL — ABNORMAL HIGH (ref 0.44–1.00)
GFR calc Af Amer: 46 mL/min — ABNORMAL LOW (ref >60)
GFR, EST NON AFRICAN AMERICAN: 40 mL/min — AB (ref >60)
GLUCOSE: 93 mg/dL (ref 65–99)
POTASSIUM: 4.2 mmol/L (ref 3.5–5.1)
Sodium: 140 mmol/L (ref 135–145)

## 2017-02-11 LAB — POCT I-STAT TROPONIN I: Troponin i, poc: 0.03 ng/mL (ref 0.00–0.08)

## 2017-02-11 MED ORDER — TECHNETIUM TO 99M ALBUMIN AGGREGATED
4.0000 | Freq: Once | INTRAVENOUS | Status: AC | PRN
  Administered 2017-02-11: 4 via INTRAVENOUS

## 2017-02-11 MED ORDER — ASPIRIN EC 81 MG PO TBEC
81.0000 mg | DELAYED_RELEASE_TABLET | Freq: Every day | ORAL | Status: DC
  Administered 2017-02-11: 81 mg via ORAL
  Filled 2017-02-11 (×2): qty 1

## 2017-02-11 MED ORDER — LOSARTAN POTASSIUM 25 MG PO TABS: 12.5000 mg | ORAL_TABLET | Freq: Every day | ORAL | 0 refills | Status: DC

## 2017-02-11 MED ORDER — TECHNETIUM TC 99M DIETHYLENETRIAME-PENTAACETIC ACID
30.0000 | Freq: Once | INTRAVENOUS | Status: AC | PRN
  Administered 2017-02-11: 30 via RESPIRATORY_TRACT

## 2017-02-11 MED ORDER — ASPIRIN 81 MG PO TBEC
81.0000 mg | DELAYED_RELEASE_TABLET | Freq: Every day | ORAL | 0 refills | Status: AC
Start: 2017-02-11 — End: 2017-03-13

## 2017-02-11 NOTE — Progress Notes (Signed)
Pt  Urinated at 6: 50 pm, preceding with discharge as plan per Dr. Magdalene Molly.

## 2017-02-11 NOTE — NC FL2 (Signed)
[place] MEDICAID FL2 LEVEL OF CARE SCREENING TOOL     IDENTIFICATION  Patient Name: [name] Birthdate: 1920/11/18 Sex: female Admission Date (Current Location): 02/08/2017  [facility] and [place] Number:  Producer, television/film/video and Address:  [facility], [address] [place], [place] [address]      Provider Number: [id number]  Attending Physician Name and Address:  Coralie Keens  Relative Name and Phone Number:       Current Level of Care: Hospital Recommended Level of Care: Skilled Nursing Facility Prior Approval Number:    Date Approved/Denied:   PASRR Number: [id number]  Discharge Plan: SNF    Current Diagnoses: Patient Active Problem List   Diagnosis Date Noted  . Biventricular cardiac pacemaker in situ   . LBBB (left bundle branch block)   . Fever 02/08/2017  . Sepsis (HCC) 02/08/2017  . Chest pain 02/08/2017  . Lower abdominal pain 02/08/2017  . Dysphagia 12/11/2016  . CKD (chronic kidney disease) stage 3, GFR 30-59 ml/min (HCC) 12/11/2016  . Hypoxia 10/25/2016  . Dementia 10/16/2016  . Sick sinus syndrome (HCC) 10/16/2016  . Nonischemic cardiomyopathy (HCC) 10/16/2016  . Immobility 10/16/2016  . Periprosthetic supracondylar fracture of femur 09/30/2016  . Osteoarthritis 05/10/2014  . Gout 05/10/2014  . Anemia 04/19/2014  . Constipation 04/19/2014  . Hip fracture (HCC) 04/04/2014  . Right knee pain   . Contusion of foot, right 05/15/2012  . Palpitations 01/09/2011  . Pacemaker 10/10/2010  . Acute on chronic systolic (congestive) heart failure (HCC) 10/10/2010  . Dyslipidemia 10/10/2010    Orientation RESPIRATION BLADDER Height & Weight     Self, Time, Situation, Place  O2(Nasal Canula 2.5 L) Continent, Indwelling catheter Weight: 182 lb 9.6 oz (82.8 kg) Height:  5\' 5"  (165.1 cm)  BEHAVIORAL SYMPTOMS/MOOD NEUROLOGICAL BOWEL NUTRITION STATUS  (None) (None) Continent Diet(Heart healthy)  AMBULATORY  STATUS COMMUNICATION OF NEEDS Skin   Limited Assist Verbally Bruising                       Personal Care Assistance Level of Assistance  Bathing, Feeding, Dressing Bathing Assistance: Limited assistance Feeding assistance: Limited assistance Dressing Assistance: Limited assistance     Functional Limitations Info  Sight, Hearing, Speech Sight Info: Adequate Hearing Info: Adequate Speech Info: Adequate    SPECIAL CARE FACTORS FREQUENCY  PT (By licensed PT), OT (By licensed OT)     PT Frequency: 5 x week OT Frequency: 5 x week            Contractures Contractures Info: Not present    Additional Factors Info  Code Status, Allergies Code Status Info: DNR Allergies Info: Arthrotec (Diclofenac-misoprostol), Ibuprofen, Lactose Intolerance (Gi), Milk-related Compounds, Other, Oxycontin (Oxycodone Hcl), Pollen Extract           Current Medications (02/11/2017):  This is the current hospital active medication list Current Facility-Administered Medications  Medication Dose Route Frequency Provider Last Rate Last Dose  . 0.9 %  sodium chloride infusion  250 mL Intravenous PRN Pearson Grippe, MD      . acetaminophen (TYLENOL) tablet 650 mg  650 mg Oral Q6H PRN Pearson Grippe, MD       Or  . acetaminophen (TYLENOL) suppository 650 mg  650 mg Rectal Q6H PRN Pearson Grippe, MD      . albuterol (PROVENTIL) (2.5 MG/3ML) 0.083% nebulizer solution 2.5 mg  2.5 mg Nebulization Q6H PRN Pearson Grippe, MD      .  allopurinol (ZYLOPRIM) tablet 100 mg  100 mg Oral Daily Pearson GrippeKim, James, MD   100 mg at 02/11/17 [id number]  . aspirin EC tablet 81 mg  81 mg Oral Daily Arrien, York RamMauricio Daniel, MD   81 mg at 02/11/17 0941  . calcium gluconate inj 10% (1 g) URGENT USE ONLY!  1 g Intravenous Once Mancel BaleWentz, Elliott, MD   Stopped at 02/08/17 1213  . carvedilol (COREG) tablet 1.5625 mg  1.5625 mg Oral BID WC Pearson GrippeKim, James, MD   1.5625 mg at 02/11/17 0939  . docusate (COLACE) 50 MG/5ML liquid 100 mg  100 mg Oral Daily Pearson GrippeKim, James,  MD   100 mg at 02/11/17 0944  . enoxaparin (LOVENOX) injection 40 mg  40 mg Subcutaneous Q24H Pearson GrippeKim, James, MD   40 mg at 02/11/17 0943  . furosemide (LASIX) tablet 40 mg  40 mg Oral Daily Arrien, York RamMauricio Daniel, MD   40 mg at 02/11/17 [phone] [id number]  . ipratropium-albuterol (DUONEB) 0.5-2.5 (3) MG/3ML nebulizer solution 3 mL  3 mL Nebulization Q6H PRN Pearson GrippeKim, James, MD      . lactose free nutrition (BOOST PLUS) liquid 237 mL  237 mL Oral Daily Pearson GrippeKim, James, MD   237 mL at 02/10/17 0937  . losartan (COZAAR) tablet 12.5 mg  12.5 mg Oral QHS Bensimhon, Bevelyn Bucklesaniel R, MD   12.5 mg at 02/10/17 2152  . MEDLINE mouth rinse  15 mL Mouth Rinse BID Arrien, York RamMauricio Daniel, MD   15 mL at 02/11/17 1007  . morphine 4 MG/ML injection 2 mg  2 mg Intravenous Once Adela LankFloyd, Dan, DO      . nitroGLYCERIN (NITROSTAT) SL tablet 0.4 mg  0.4 mg Sublingual Q5 min PRN Pearson GrippeKim, James, MD      . pantoprazole (PROTONIX) EC tablet 40 mg  40 mg Oral BID Pearson GrippeKim, James, MD   40 mg at 02/11/17 [id number]  . polyethylene glycol (MIRALAX / GLYCOLAX) packet 17 g  17 g Oral Daily Pearson GrippeKim, James, MD   17 g at 02/11/17 0942  . polyvinyl alcohol (LIQUIFILM TEARS) 1.4 % ophthalmic solution 2 drop  2 drop Both Eyes BID Pearson GrippeKim, James, MD   2 drop at 02/11/17 0944  . sertraline (ZOLOFT) tablet 25 mg  25 mg Oral Daily Pearson GrippeKim, James, MD   25 mg at 02/11/17 [id number]  . sodium chloride flush (NS) 0.9 % injection 3 mL  3 mL Intravenous Q12H Pearson GrippeKim, James, MD   3 mL at 02/11/17 0945  . sodium chloride flush (NS) 0.9 % injection 3 mL  3 mL Intravenous PRN Pearson GrippeKim, James, MD      . vitamin B-12 (CYANOCOBALAMIN) tablet 1,000 mcg  1,000 mcg Oral Daily Pearson GrippeKim, James, MD   1,000 mcg at 02/11/17 [id number]     Discharge Medications: Please see discharge summary for a list of discharge medications.  Relevant Imaging Results:  Relevant Lab Results:   Additional Information SS#: [id number]  Margarito LinerSarah C Shrita Thien, LCSW

## 2017-02-11 NOTE — Discharge Summary (Addendum)
Physician Discharge Summary  [name] ZOX:[id number] DOB: 1921-01-12 DOA: 02/08/2017  PCP: [name], DO  Admit date: 02/08/2017 Discharge date: 02/11/2017  Admitted From: SNF Disposition:  SNF  Recommendations for Outpatient Follow-up:  1. Follow up with PCP in 1-week 2. Patient has been placed on low dose losartan for heart failure 3. Biv pacer reprogrammed  Home Health: No Equipment/Devices: No    Discharge Condition: Stable CODE STATUS: DNR Diet recommendation: Heart Healthy   Brief/Interim Summary: 83 year old female who presented for chest pain. She does have the significant past medical history of pulmonary embolism,hypertension, coronary artery disease, dyslipidemia.Patient developed sharp chest pain for about 10 hours, substernal, non-radiating.On initial physical examination her blood pressure was 98/50, heart rate 85 bpm,temperature 100.5, respiratory rate 17, oxygen saturation 97%.His lungs were clear to auscultation bilaterally, no wheezing, rales or rhonchi, heart S1-S2 present regular and rhythmic, the abdomen was soft nontender, no lower extremity edema. Sodium 138, potassium 4.7, chloride 102, bicarbonate 30, glucose 132, BUN 19, creatinine 0.95, AST 20, ALT 13,troponin 0.03,white cell count 14.3, hemoglobin 11.1, hematocrit 35.9, platelets 202,d-dimer 2.69,urine analysis negative for infection, specific gravity greater than 1.046.Chest x-ray,right rotation,hypoinflation,no infiltrates, effusions or signs of pneumothorax.Pacemaker in place,with right atrial and ventricular leads in place.CT of the chest and abdomen with no acute findings, besidesmall bilateral pleural effusions.Positive cholelithiasis.Ultrasound of the abdomen with cholelithiasis but no signs of cholecystitis.EKG sinus rhythm rate 92 bpm, left axis deviation, left bundle-branch block, T-wave inversions in V5 and V6,poor R-wave progression, positive premature atrial complex.  (Chronic changes compared to July 2018).  Patient was admitted to the hospital with working diagnosis of chest pain and fever rule out sepsis.  1. Fever, in the setting of gallstones, sepsis was ruled out. Patient was admitted to the stepdown unit, she was placed on broad-spectrum IV antibiotics and received IV fluids, further imaging with hepatobiliary scan ruled out cholecystitis. Antibiotics were discontinued as well as IV fluids, patient remained afebrile with no leukocytosis. No further intervention, patient was seen by surgery as consultation. As part of the workup the patient had an elevated d-dimer, she does have history of pulmonary embolism in the past, a pulmonary VQ scan will be performed before patient's discharge.  2. Ischemic cardiomyopathy with systolic heart failure, ejection fraction 20%, sinus node dysfunction status post pacemaker (DDD) with acute exacerbation. Patient developed hypervolemia due to fluid resuscitation. She required IV furosemide with improvement of her symptoms. Bedside echocardiography showed ejection fraction about 20%. Patient device BiV was reprogrammed by electrophysiology. Patient will continue carvedilol and losartan was added to her medical regimen, with good toleration.  3. Hypertension. Blood pressure in the low normal range, tolerated well losartan, will continue carvedilol and furosemide.  4. Coronary artery disease. She will continue aspirin and statin therapy.  5. Dyslipidemia. Continue statin.   Discharge Diagnoses:  Principal Problem:   Fever Active Problems:   Acute on chronic systolic (congestive) heart failure (HCC)   Sepsis (HCC)   Chest pain   Lower abdominal pain   Biventricular cardiac pacemaker in situ   LBBB (left bundle branch block)    Discharge Instructions   Allergies as of 02/11/2017      Reactions   Arthrotec [diclofenac-misoprostol]    Ibuprofen    Lactose Intolerance (gi)    Milk-related Compounds Nausea And  Vomiting   Cheese   Other    Grass and ragweed    Oxycontin [oxycodone Hcl]    Pollen Extract  Medication List    TAKE these medications   acetaminophen 325 MG tablet Commonly known as:  TYLENOL Take 650 mg by mouth every 6 (six) hours as needed for mild pain. What changed:  Another medication with the same name was removed. Continue taking this medication, and follow the directions you see here.   albuterol 108 (90 Base) MCG/ACT inhaler Commonly known as:  PROVENTIL HFA;VENTOLIN HFA Inhale 2 puffs into the lungs every 6 (six) hours as needed for wheezing or shortness of breath.   allopurinol 100 MG tablet Commonly known as:  ZYLOPRIM Take 100 mg by mouth daily.   aspirin 81 MG EC tablet Take 1 tablet (81 mg total) by mouth daily.   carvedilol 3.125 MG tablet Commonly known as:  COREG Take 1.5625 mg by mouth 2 (two) times daily with a meal.   cyanocobalamin 1000 MCG tablet Take 1,000 mcg by mouth daily.   docusate 50 MG/5ML liquid Commonly known as:  COLACE Take 100 mg by mouth daily.   furosemide 40 MG tablet Commonly known as:  LASIX Take 40 mg by mouth every other day. Alternate with 20mg  lasix   ipratropium-albuterol 0.5-2.5 (3) MG/3ML Soln Commonly known as:  DUONEB Take 3 mLs by nebulization every 6 (six) hours as needed (wheezing).   lactose free nutrition Liqd Take 237 mLs daily by mouth.   losartan 25 MG tablet Commonly known as:  COZAAR Take 0.5 tablets (12.5 mg total) by mouth at bedtime.   morphine CONCENTRATE 10 mg / 0.5 ml concentrated solution Take 5 mg by mouth once.   nitroGLYCERIN 0.4 MG SL tablet Commonly known as:  NITROSTAT Place 0.4 mg under the tongue every 5 (five) minutes as needed for chest pain.   omeprazole 20 MG tablet Commonly known as:  PRILOSEC OTC Take 20 mg by mouth 2 (two) times daily.   OPTIVE 0.5-0.9 % ophthalmic solution Generic drug:  carboxymethylcellul-glycerin Apply 2 drops to eye 2 (two) times daily.    polyethylene glycol packet Commonly known as:  MIRALAX / GLYCOLAX Take 17 g by mouth daily. Hold for loose stools   potassium chloride SA 20 MEQ tablet Commonly known as:  K-DUR,KLOR-CON Take 20 mEq by mouth daily.   ranitidine 150 MG tablet Commonly known as:  ZANTAC Take 150 mg by mouth at bedtime.   sertraline 25 MG tablet Commonly known as:  ZOLOFT Take 25 mg by mouth daily.       Allergies  Allergen Reactions  . Arthrotec [Diclofenac-Misoprostol]   . Ibuprofen   . Lactose Intolerance (Gi)   . Milk-Related Compounds Nausea And Vomiting    Cheese  . Other     Grass and ragweed   . Oxycontin [Oxycodone Hcl]   . Pollen Extract     Consultations:  Cardiology   Surgery   Procedures/Studies: Nm Hepatobiliary Liver Func  Result Date: 02/09/2017 CLINICAL DATA:  83 year old with right upper quadrant pain and suspect cholecystitis. EXAM: NUCLEAR MEDICINE HEPATOBILIARY IMAGING TECHNIQUE: Sequential images of the abdomen were obtained out to 60 minutes following intravenous administration of radiopharmaceutical. RADIOPHARMACEUTICALS:  5.3 mCi Tc-4530m  Choletec IV COMPARISON:  Ultrasound 02/08/2017 FINDINGS: Prompt uptake and biliary excretion of activity by the liver is seen. Gallbladder activity is visualized, consistent with patency of cystic duct. Biliary activity passes into small bowel, consistent with patent common bile duct. IMPRESSION: Normal hepatobiliary examination.  The cystic duct is patent. Electronically Signed   By: Richarda OverlieAdam  Henn M.D.   On: 02/09/2017 10:24  Dg Chest Port 1 View  Result Date: 02/08/2017 CLINICAL DATA:  Chest pain and shortness of Breath EXAM: PORTABLE CHEST 1 VIEW COMPARISON:  04/04/2014 FINDINGS: Cardiac shadow is mildly enlarged. Large hiatal hernia is seen. Defibrillator is again noted and stable. The lungs are well aerated bilaterally. Persistent thickening of the minor fissure on the right is noted consistent with scarring. No focal  infiltrate is seen. IMPRESSION: Large hiatal hernia. Chronic scarring in the right mid lung. Electronically Signed   By: Alcide Clever M.D.   On: 02/08/2017 12:04   Ct Angio Chest/abd/pel For Dissection W And/or Wo Contrast  Result Date: 02/08/2017 CLINICAL DATA:  Dyspnea x3 days. EXAM: CT ANGIOGRAPHY CHEST, ABDOMEN AND PELVIS TECHNIQUE: Multidetector CT imaging through the chest, abdomen and pelvis was performed using the standard protocol during bolus administration of intravenous contrast. Multiplanar reconstructed images and MIPs were obtained and reviewed to evaluate the vascular anatomy. CONTRAST:  ISOVUE-370 IOPAMIDOL (ISOVUE-370) INJECTION 76% COMPARISON:  Same day CXR.  Chest CT from 02/06/2011. FINDINGS: CTA CHEST FINDINGS Cardiovascular: Stable cardiomegaly with ICD device along the left anterior chest wall and leads in the right atrium coronary sinus and right ventricle. No pericardial effusion. Left main and three-vessel coronary arteriosclerosis. No large central pulmonary embolus. Mediastinum/Nodes: Mild atherosclerosis of the thoracic aorta at the arch and along the descending portion. No aneurysm or dissection. No mediastinal adenopathy. Patent trachea and mainstem bronchi. Large hiatal hernia. Lungs/Pleura: Small bilateral pleural effusions adjacent to the large hiatal hernia. Areas of compressive atelectasis are noted at each lung base with dependent atelectasis along the posterior aspect of both lower lobes. Respiratory motion artifacts limit assessment. Musculoskeletal: Thoracic spondylosis with kyphosis attributable to multilevel degenerative disc disease. No acute nor suspicious osseous abnormality. Left shoulder arthroplasty. Chronic posterior left lower rib fractures. Review of the MIP images confirms the above findings. CTA ABDOMEN AND PELVIS FINDINGS VASCULAR Aorta: No aneurysm or dissection. Moderate atherosclerosis. No significant stenosis. Celiac: Minimal atherosclerosis at  the origin. The celiac axis and branch vessels are patent without significant stenosis, aneurysm or dissection. SMA: Atherosclerosis at the origin of the SMA without significant stenosis. No occlusion, dissection or aneurysm. Renals: Atherosclerosis of the origins of both renal arteries without significant luminal narrowing, fibromuscular dysplasia, aneurysm or dissection. IMA: Patent. Inflow: Patent without evidence of aneurysm, dissection, vasculitis or significant stenosis. Mild atherosclerosis of the right common and both internal iliac arteries. Veins: No obvious venous abnormality within the limitations of this arterial phase study. Review of the MIP images confirms the above findings. NON-VASCULAR Hepatobiliary: No focal liver abnormality is seen. Tiny layering calculi within the physiologically distended gallbladder. No mural thickening or pericholecystic fluid. Pancreas: Normal without ductal dilatation or enhancing mass. Spleen: No splenomegaly or mass. Adrenals/Urinary Tract: Bilateral renal cortical thinning with right interpolar renal cyst measuring 2.9 cm. Mild thickening of the adrenal glands without focal mass. No nephrolithiasis nor hydroureteronephrosis. The urinary bladder is partially obscured by the patient's hip arthroplasty on the left. Stomach/Bowel: Large hiatal hernia as previously mentioned. No bowel obstruction or inflammation. Partial colectomy. Lymphatic: No lymphadenopathy. Reproductive: The lower pelvis is obscured by the patient's left hip arthroplasty. The uterus is either atrophic or surgically absent. No adnexal mass. Other: No free air free fluid. Musculoskeletal: Old posttraumatic deformity of the pubic rami with healing. Dextroscoliosis of the upper thoracic spine. Osteoarthritis of the native right hip. Lumbar spondylosis. Review of the MIP images confirms the above findings. IMPRESSION: 1. Small bilateral pleural effusions with compressive atelectasis  adjacent to a large  known hiatal hernia containing much of the stomach. No active pulmonary disease. 2. Minimal thoracic aortic atherosclerosis without aneurysmal dilatation or evidence of dissection. 3. Left main and three-vessel coronary arteriosclerosis. 4. Ectasia of the abdominal aorta with patent branch vessels. Minimal atherosclerosis at the origins of the branch vessels as above described without significant luminal narrowing. 5. Thoracolumbar spondylosis with left hip and shoulder arthroplasties. Lumbar dextroscoliosis. Chronic bilateral pubic rami fractures. 6. 2.9 cm right interpolar renal cysts with mild cortical thinning of both kidneys. No obstructive uropathy. 7. Uncomplicated appearing cholelithiasis. Electronically Signed   By: Tollie Eth M.D.   On: 02/08/2017 18:02   US Abdomen Limited Ruq  Result Date: 02/08/2017 CLINICAL DATA:  Right upper quadrant abdominal pain. EXAM: ULTRASOUND ABDOMEN LIMITED RIGHT UPPER QUADRANT COMPARISON:  Ultrasound of May 04, 2015. FINDINGS: Gallbladder: 6 mm gallstone is noted. No gallbladder wall thickening or pericholecystic fluid is noted. No sonographic Murphy's sign is noted. Common bile duct: Diameter: 5 mm which is within normal limits. Liver: 8 mm cyst is noted in left hepatic lobe. Within normal limits in parenchymal echogenicity. Portal vein is patent on color Doppler imaging with normal direction of blood flow towards the liver. IMPRESSION: Cholelithiasis without definite evidence of cholecystitis. Small left hepatic cyst. Electronically Signed   By: Lupita Raider, M.D.   On: 02/08/2017 20:43       Subjective: Patient feeling better, no nausea or vomiting, no abdominal pain, no chest pain or dyspnea.   Discharge Exam: Vitals:   02/11/17 0031 02/11/17 0526  BP: (!) 102/53 (!) 115/58  Pulse: 82 81  Resp: 18 16  Temp: 98.7 F (37.1 C) 99.1 F (37.3 C)  SpO2: 94% 97%   Vitals:   02/10/17 1542 02/10/17 2008 02/11/17 0031 02/11/17 0526  BP: 102/60  (!) 102/48 (!) 102/53 (!) 115/58  Pulse: 81 80 82 81  Resp: 18 18 18 16   Temp: 98.8 F (37.1 C) 99.5 F (37.5 C) 98.7 F (37.1 C) 99.1 F (37.3 C)  TempSrc: Oral Oral Oral Oral  SpO2: 90% 90% 94% 97%  Weight: 82.7 kg (182 lb 5.1 oz)   82.8 kg (182 lb 9.6 oz)  Height: 5\' 5"  (1.651 m)       General: Pt is alert, awake, not in acute distress ENT. Mild pallor, no icterus.  Cardiovascular: RRR, S1/S2 +, no rubs, no gallops, No jvd Respiratory: CTA bilaterally, no wheezing, no rhonchi Abdominal: Soft, NT, ND, bowel sounds + Extremities: no edema, no cyanosis    The results of significant diagnostics from this hospitalization (including imaging, microbiology, ancillary and laboratory) are listed below for reference.     Microbiology: Recent Results (from the past 240 hour(s))  Blood culture (routine x 2)     Status: None (Preliminary result)   Collection Time: 02/08/17  9:05 PM  Result Value Ref Range Status   Specimen Description BLOOD RIGHT FOREARM  Final   Special Requests   Final    BOTTLES DRAWN AEROBIC AND ANAEROBIC Blood Culture adequate volume   Culture NO GROWTH 2 DAYS  Final   Report Status PENDING  Incomplete  Blood culture (routine x 2)     Status: None (Preliminary result)   Collection Time: 02/08/17  9:11 PM  Result Value Ref Range Status   Specimen Description BLOOD LEFT FOREARM  Final   Special Requests   Final    BOTTLES DRAWN AEROBIC AND ANAEROBIC Blood Culture results may not be optimal  due to an inadequate volume of blood received in culture bottles   Culture NO GROWTH 2 DAYS  Final   Report Status PENDING  Incomplete  MRSA PCR Screening     Status: None   Collection Time: 02/09/17 11:00 AM  Result Value Ref Range Status   MRSA by PCR NEGATIVE NEGATIVE Final    Comment:        The GeneXpert MRSA Assay (FDA approved for NASAL specimens only), is one component of a comprehensive MRSA colonization surveillance program. It is not intended to diagnose  MRSA infection nor to guide or monitor treatment for MRSA infections.      Labs: BNP (last 3 results) No results for input(s): BNP in the last 8760 hours. Basic Metabolic Panel: Recent Labs  Lab 02/08/17 1045 02/08/17 1054 02/08/17 1938 02/09/17 0349 02/10/17 0318 02/11/17 0426  NA  --  141 138 138 140 140  K  --  4.5 4.7 4.2 3.9 4.2  CL  --  103 102 106 105 103  CO2  --   --  30 27 27 30   GLUCOSE  --  109* 132* 107* 103* 93  BUN  --  24* 19 20 23* 22*  CREATININE  --  1.00 0.95 0.94 1.15* 1.14*  CALCIUM  --   --  8.4* 7.7* 8.2* 8.1*  MG 2.0  --   --   --   --   --    Liver Function Tests: Recent Labs  Lab 02/08/17 1938 02/09/17 0349  AST 20 15  ALT 13* 11*  ALKPHOS 78 64  BILITOT 1.1 1.1  PROT 6.7 6.0*  ALBUMIN 3.2* 2.7*   No results for input(s): LIPASE, AMYLASE in the last 168 hours. No results for input(s): AMMONIA in the last 168 hours. CBC: Recent Labs  Lab 02/05/17 02/08/17 1045 02/08/17 1054 02/09/17 1622 02/10/17 0318 02/11/17 0426  WBC 13.3 14.3*  --  8.9 8.4 8.0  NEUTROABS 10 11.2*  --   --  6.2 5.3  HGB 10.2* 11.1* 10.9* 9.6* 9.3* 9.3*  HCT 34* 35.9* 32.0* 31.5* 28.0* 29.0*  MCV  --  100.8*  --  103.6* 105.7* 104.3*  PLT 163 202  --  181 182 207   Cardiac Enzymes: Recent Labs  Lab 02/09/17 0349 02/09/17 0710  TROPONINI 0.03* <0.03   BNP: Invalid input(s): POCBNP CBG: No results for input(s): GLUCAP in the last 168 hours. D-Dimer Recent Labs    02/08/17 1530  DDIMER 2.69*   Hgb A1c No results for input(s): HGBA1C in the last 72 hours. Lipid Profile Recent Labs    02/09/17 0349  CHOL 159  HDL 49  LDLCALC 96  TRIG 69  CHOLHDL 3.2   Thyroid function studies No results for input(s): TSH, T4TOTAL, T3FREE, THYROIDAB in the last 72 hours.  Invalid input(s): FREET3 Anemia work up No results for input(s): VITAMINB12, FOLATE, FERRITIN, TIBC, IRON, RETICCTPCT in the last 72 hours. Urinalysis    Component Value Date/Time    COLORURINE YELLOW 02/08/2017 2152   APPEARANCEUR CLEAR 02/08/2017 2152   LABSPEC >1.046 (H) 02/08/2017 2152   PHURINE 5.0 02/08/2017 2152   GLUCOSEU NEGATIVE 02/08/2017 2152   HGBUR NEGATIVE 02/08/2017 2152   BILIRUBINUR NEGATIVE 02/08/2017 2152   KETONESUR NEGATIVE 02/08/2017 2152   PROTEINUR NEGATIVE 02/08/2017 2152   UROBILINOGEN 0.2 04/04/2014 2228   NITRITE NEGATIVE 02/08/2017 2152   LEUKOCYTESUR NEGATIVE 02/08/2017 2152   Sepsis Labs Invalid input(s): PROCALCITONIN,  WBC,  LACTICIDVEN Microbiology Recent  Results (from the past 240 hour(s))  Blood culture (routine x 2)     Status: None (Preliminary result)   Collection Time: 02/08/17  9:05 PM  Result Value Ref Range Status   Specimen Description BLOOD RIGHT FOREARM  Final   Special Requests   Final    BOTTLES DRAWN AEROBIC AND ANAEROBIC Blood Culture adequate volume   Culture NO GROWTH 2 DAYS  Final   Report Status PENDING  Incomplete  Blood culture (routine x 2)     Status: None (Preliminary result)   Collection Time: 02/08/17  9:11 PM  Result Value Ref Range Status   Specimen Description BLOOD LEFT FOREARM  Final   Special Requests   Final    BOTTLES DRAWN AEROBIC AND ANAEROBIC Blood Culture results may not be optimal due to an inadequate volume of blood received in culture bottles   Culture NO GROWTH 2 DAYS  Final   Report Status PENDING  Incomplete  MRSA PCR Screening     Status: None   Collection Time: 02/09/17 11:00 AM  Result Value Ref Range Status   MRSA by PCR NEGATIVE NEGATIVE Final    Comment:        The GeneXpert MRSA Assay (FDA approved for NASAL specimens only), is one component of a comprehensive MRSA colonization surveillance program. It is not intended to diagnose MRSA infection nor to guide or monitor treatment for MRSA infections.      Time coordinating discharge: 45 minutes  SIGNED:   Coralie Keens, MD  [facility] 02/11/2017, 7:54 AM Pager [phone]  If 7PM-7AM,  please contact night-coverage [email or web address] Password [email or web address]

## 2017-02-11 NOTE — Progress Notes (Signed)
Foley catherer d/c per Dr. Magdalene Molly 11/26 2018 at 5 pm.

## 2017-02-11 NOTE — Clinical Social Work Note (Signed)
Per RN, patient now has an order for a chest xray. She will have foley removed and can discharge once she has urine output. RN will call transport when all of this is completed.   If before 5:00, please call [facility] at [phone]. If after 5:00, please call [facility] at [phone] (option 1, option 3). All information they will need is on front of paperwork in transport tray.  RN please call report to [facility] at [phone].  CSW signing off as social work intervention is no longer needed.  Charlynn Court, CSW [phone]

## 2017-02-11 NOTE — Clinical Social Work Note (Signed)
Clinical Social Work Assessment  Patient Details  Name: [name] MRN: [id number] Date of Birth: 02/26/1921  Date of referral:  02/11/17               Reason for consult:  Discharge Planning                Permission sought to share information with:  Facility Contact Representative, Family Supports Permission granted to share information::  Yes, Verbal Permission Granted  Name::        Agency::  [facility]  Relationship::  Son at bedside, Private sitter  Contact Information:     Housing/Transportation Living arrangements for the past 2 months:  Skilled Nursing Facility Source of Information:  Patient, Medical Team, Adult Children Patient Interpreter Needed:  None Criminal Activity/Legal Involvement Pertinent to Current Situation/Hospitalization:  No - Comment as needed Significant Relationships:  Adult Children Lives with:  Facility Resident Do you feel safe going back to the place where you live?  Yes Need for family participation in patient care:  Yes (Comment)  Care giving concerns:  Patient is from [facility] on their SNF side.   Social Worker assessment / plan:  CSW met with patient. Son and private sitter at bedside. CSW introduced role and explained that discharge planning would be discussed. Patient and her son confirmed that patient is from the SNF side at [facility]. CSW confirmed with admissions coordinator as well. Patient plans to return today and will need [facility] since she is on oxygen. Per RN, patient has pending VQ scan for this afternoon. Admissions coordinator aware. No further concerns. CSW encouraged patient and her son to contact CSW as needed. CSW will continue to follow patient and her son for support and facilitate discharge back to SNF today.  Employment status:  Retired Insurance information:  Managed Medicare PT Recommendations:  Not assessed at this time Information / Referral to community resources:  Skilled Nursing Facility  Patient/Family's  Response to care:  Patient and her son agreeable to return to SNF. Patient's children supportive and involved in patient's care. Patient and her son appreciated social work intervention.  Patient/Family's Understanding of and Emotional Response to Diagnosis, Current Treatment, and Prognosis:  Patient and her son have a good understanding of the reason for admission and her need for continued rehab. Patient and her son appear happy with hospital care.  Emotional Assessment Appearance:  Appears stated age Attitude/Demeanor/Rapport:  Other(Pleasant) Affect (typically observed):  Accepting, Appropriate, Calm, Pleasant Orientation:  Oriented to Self, Oriented to Place, Oriented to  Time, Oriented to Situation Alcohol / Substance use:  Never Used Psych involvement (Current and /or in the community):  No (Comment)  Discharge Needs  Concerns to be addressed:  Care Coordination Readmission within the last 30 days:  No Current discharge risk:  Dependent with Mobility Barriers to Discharge:  Other(Pending VQ scan.)    C , LCSW 02/11/2017, 12:26 PM  

## 2017-02-12 ENCOUNTER — Encounter: Payer: Self-pay | Admitting: Internal Medicine

## 2017-02-12 ENCOUNTER — Non-Acute Institutional Stay (SKILLED_NURSING_FACILITY): Payer: Medicare Other | Admitting: Internal Medicine

## 2017-02-12 DIAGNOSIS — N183 Chronic kidney disease, stage 3 unspecified: Secondary | ICD-10-CM

## 2017-02-12 DIAGNOSIS — M79602 Pain in left arm: Secondary | ICD-10-CM | POA: Diagnosis not present

## 2017-02-12 DIAGNOSIS — F0150 Vascular dementia without behavioral disturbance: Secondary | ICD-10-CM

## 2017-02-12 DIAGNOSIS — R410 Disorientation, unspecified: Secondary | ICD-10-CM | POA: Diagnosis not present

## 2017-02-12 DIAGNOSIS — I5023 Acute on chronic systolic (congestive) heart failure: Secondary | ICD-10-CM

## 2017-02-12 DIAGNOSIS — Z7189 Other specified counseling: Secondary | ICD-10-CM

## 2017-02-12 NOTE — Progress Notes (Signed)
Patient ID: [name], female   DOB: Aug 16, 1920, 83 y.o.   MRN: [id number]  Provider:  Gwenith Spitz. Renato Gails, D.O., C.M.D. Location:  [facility] Room Number: 111 Place of Service:  SNF (31)  PCP: [name], DO Patient Care Team: [name], DO as PCP - General (Geriatric Medicine) [name], NP as Nurse Practitioner (Nurse Practitioner)  Extended Emergency Contact Information Primary Emergency Contact: [name] Address: [address]          [place], [place] [address] [name] Home Phone: [phone] Relation: Other Secondary Emergency Contact: [name] [place] Home Phone: [phone] Relation: Daughter  Code Status: DNR Goals of Care: Advanced Directive information Advanced Directives 02/12/2017  Does Patient Have a Medical Advance Directive? Yes  Type of Advance Directive Out of facility DNR (pink MOST or yellow form);Living will;Healthcare Power of Attorney  Does patient want to make changes to medical advance directive? No - Patient declined  Copy of Healthcare Power of Attorney in Chart? Yes  Pre-existing out of facility DNR order (yellow form or pink MOST form) Yellow form placed in chart (order not valid for inpatient use)   Chief Complaint  Patient presents with  . Readmit To SNF    readmission to SNF    HPI: Patient is a 83 y.o. female seen today for readmission to SNF at [facility] following hospitalization for chest pain, acute on chronic systolic heart failure (already being treated at facility), fever, abdominal pain.  She was found to have gallstones and gallbladder sludge, but no clear source of her fever, chest pain, or abdominal pain were identified.  She was said to be septic at the hospital but blood cultures remain negative.  She was diuresed with iv lasix at the hospital with resolution of peripheral edema and chest pain.  She has a LBBB and pacemaker  in place.    This afternoon when I saw her, she was c/o arm pain and had been when nursing staff of second shift evaluated her.  She could not move her left arm as well and c/o pain with just light palpation over the entire left arm, but especially bad above the elbow and even over her pacemaker.  She no longer was tender over her chest or abdomen.  Her losartan and asa had been started yesterday for her CHF.  She was given prn tylenol for the arm pain, but had not responded when I saw her.  She was very confused, talking about things from the past, closing her eyes and moaning in pain.  Her daughter was present.  We discussed her goals of care and how much worse she seemed upon returning from hospitalization in terms of delirium.  Her daughter was very concerned.  We decided that for tonight, we'd keep her here and monitor her condition, treat her pain and anxiety with roxanol and ativan respectively.  We scheduled her tylenol and I ordered labs for the morning.  We spoke about the MOST form which had initially said DNR, hospitalize even in intensive care short term if needed, abx and fluid, no feeding tubes.  She agreed to maintaining her DNR. She opted to keep her her for comfort care and avoid hospitalization.  We had no indication of infection to require abx and she was eating and drinking so fluids were not required.  She was going to discuss her mother's MOST form more with her other family members and let  the staff know what they concluded.  I spent about 25 minutes meeting with her daughter about Advance Care Planning.  I spent an additional 40 mins reviewing her hospital record, performing her history and exam and coordinating care with the nursing staff.    Past Medical History:  Diagnosis Date  . CAD (coronary artery disease)   . Chronic low back pain   . Contusion of foot, right 05/15/12  . Diverticulitis   . Dyslipidemia   . H/O: hysterectomy   . HTN (hypertension)   . Memory disturbance  05/2012   MMSE 26/30, intact Clock test  . Nonischemic cardiomyopathy (HCC)   . Osteoporosis   . Pulmonary embolism (HCC)   . Right knee pain 05/15/12  . Spinal stenosis   . Ulcer disease   . Weight loss    Past Surgical History:  Procedure Laterality Date  . APPENDECTOMY  2010  . BACK SURGERY    . BIV ICD GENERTAOR CHANGE OUT N/A 05/24/2011   Procedure: BIV ICD GENERTAOR CHANGE OUT;  Surgeon: [name], MD;  Location: [facility] CATH LAB;  Service: Cardiovascular;  Laterality: N/A;  . CATARACT EXTRACTION    . COLON RESECTION  2005  . CORONARY ANGIOPLASTY WITH STENT PLACEMENT  01/2003  . HEMICOLECTOMY    . HIP ARTHROPLASTY Left 04/05/2014   Procedure: ARTHROPLASTY BIPOLAR HIP;  Surgeon: Shelda Pal, MD;  Location: [facility] ORS;  Service: Orthopedics;  Laterality: Left;  . left shoulder replacement  1991   Dr. [name]  . ORIF HIP FRACTURE Left 09/30/2016   Procedure: OPEN REDUCTION INTERNAL FIXATION PERIPROSTHETIC HIP FRACTURE;  Surgeon: Ollen Gross, MD;  Location: [facility] ORS;  Service: Orthopedics;  Laterality: Left;  . right shoulder repair  1985   Dr. [name]  . rotator cuff debridement  1999   Dr. [name]    reports that  has never smoked. she has never used smokeless tobacco. She reports that she does not drink alcohol or use drugs. Social History   Socioeconomic History  . Marital status: Widowed    Spouse name: Not on file  . Number of children: Not on file  . Years of education: Not on file  . Highest education level: Not on file  Social Needs  . Financial resource strain: Not on file  . Food insecurity - worry: Not on file  . Food insecurity - inability: Not on file  . Transportation needs - medical: Not on file  . Transportation needs - non-medical: Not on file  Occupational History  . Not on file  Tobacco Use  . Smoking status: Never Smoker  . Smokeless tobacco: Never Used  Substance and Sexual Activity  . Alcohol use: No  . Drug use: No  . Sexual activity: Not on file    Other Topics Concern  . Not on file  Social History Narrative  . Not on file    Functional Status Survey:    Family History  Problem Relation Age of Onset  . Coronary artery disease Sister   . Heart attack Father 32  . Heart attack Mother   . Other Mother        abdominal blockage    Health Maintenance  Topic Date Due  . DEXA SCAN  05/18/1987  . TETANUS/TDAP  10/02/2026  . PNA vac Low Risk Adult  Completed    Allergies  Allergen Reactions  . Arthrotec [Diclofenac-Misoprostol]   . Ibuprofen   . Lactose Intolerance (Gi)   . Milk-Related Compounds Nausea  And Vomiting    Cheese  . Other     Grass and ragweed   . Oxycontin [Oxycodone Hcl]   . Pollen Extract     Outpatient Encounter Medications as of 02/12/2017  Medication Sig  . acetaminophen (TYLENOL) 325 MG tablet Take 650 mg by mouth every 6 (six) hours as needed for mild pain.  Marland Kitchen albuterol (PROVENTIL HFA;VENTOLIN HFA) 108 (90 Base) MCG/ACT inhaler Inhale 2 puffs into the lungs every 6 (six) hours as needed for wheezing or shortness of breath.  . allopurinol (ZYLOPRIM) 100 MG tablet Take 100 mg by mouth daily.  Marland Kitchen aspirin EC 81 MG EC tablet Take 1 tablet (81 mg total) by mouth daily.  . Carboxymethylcellul-Glycerin (OPTIVE) 0.5-0.9 % SOLN Apply 2 drops to eye 2 (two) times daily.  . carvedilol (COREG) 3.125 MG tablet Take 1.5625 mg by mouth 2 (two) times daily with a meal.   . cyanocobalamin 1000 MCG tablet Take 1,000 mcg by mouth daily.   Marland Kitchen docusate (COLACE) 50 MG/5ML liquid Take 100 mg by mouth daily.  . furosemide (LASIX) 40 MG tablet Take 40 mg by mouth every other day. Alternate with 20mg  lasix  . ipratropium-albuterol (DUONEB) 0.5-2.5 (3) MG/3ML SOLN Take 3 mLs by nebulization every 6 (six) hours as needed (wheezing).   . lactose free nutrition (BOOST PLUS) LIQD Take 237 mLs daily by mouth.   . losartan (COZAAR) 25 MG tablet Take 0.5 tablets (12.5 mg total) by mouth at bedtime.  . nitroGLYCERIN (NITROSTAT) 0.4  MG SL tablet Place 0.4 mg under the tongue every 5 (five) minutes as needed for chest pain.  Marland Kitchen omeprazole (PRILOSEC OTC) 20 MG tablet Take 20 mg by mouth 2 (two) times daily.  . polyethylene glycol (MIRALAX / GLYCOLAX) packet Take 17 g by mouth daily. Hold for loose stools  . potassium chloride SA (K-DUR,KLOR-CON) 20 MEQ tablet Take 20 mEq by mouth daily.  . sertraline (ZOLOFT) 25 MG tablet Take 25 mg by mouth daily.  . Zinc Oxide (DESITIN) 13 % CREA Apply topically as needed (after each bathroom use).  . [DISCONTINUED] Morphine Sulfate (MORPHINE CONCENTRATE) 10 mg / 0.5 ml concentrated solution Take 5 mg by mouth once.  . [DISCONTINUED] ranitidine (ZANTAC) 150 MG tablet Take 150 mg by mouth at bedtime.   No facility-administered encounter medications on file as of 02/12/2017.     Review of Systems  Constitutional: Positive for activity change. Negative for appetite change, chills and fever.  HENT: Negative for congestion.   Respiratory: Negative for choking, chest tightness and shortness of breath.   Cardiovascular: Negative for chest pain, palpitations and leg swelling.  Gastrointestinal: Negative for abdominal pain.  Genitourinary: Negative for dysuria.  Musculoskeletal: Positive for gait problem.       Left arm pain (see hpi)  Skin: Negative for color change.  Neurological: Negative for dizziness and weakness.  Hematological: Bruises/bleeds easily.  Psychiatric/Behavioral: Positive for confusion and hallucinations.    Vitals:   02/12/17 1218  BP: 121/82  Pulse: 77  Resp: 17  Temp: 98.9 F (37.2 C)  TempSrc: Oral  SpO2: 96%  Weight: 183 lb (83 kg)   Body mass index is 30.45 kg/m. Physical Exam  Constitutional: She appears well-developed and well-nourished. No distress.  Cardiovascular: Normal rate, regular rhythm, normal heart sounds and intact distal pulses.  Pulmonary/Chest: Effort normal and breath sounds normal. No respiratory distress.  Abdominal: Soft. Bowel  sounds are normal. She exhibits no distension. There is no tenderness.  Musculoskeletal: She  exhibits no edema.  Neurological:  Actively hallucinating and having delusions as well from the past  Skin: Skin is warm and dry. No erythema.  Psychiatric:  Crying out in pain with light palpation of left arm, over pacemaker and at rotator cuff insertion, I was unable to fully passively test her left arm and she was too upset to follow commands to actively move the arm    Labs reviewed: Basic Metabolic Panel: Recent Labs    02/08/17 1045  02/09/17 0349 02/10/17 0318 02/11/17 0426  NA  --    < > 138 140 140  K  --    < > 4.2 3.9 4.2  CL  --    < > 106 105 103  CO2  --    < > 27 27 30   GLUCOSE  --    < > 107* 103* 93  BUN  --    < > 20 23* 22*  CREATININE  --    < > 0.94 1.15* 1.14*  CALCIUM  --    < > 7.7* 8.2* 8.1*  MG 2.0  --   --   --   --    < > = values in this interval not displayed.   Liver Function Tests: Recent Labs    09/30/16 0538  01/29/17 0900 02/08/17 1938 02/09/17 0349  AST 28   < > 15 20 15   ALT 8*   < > 7 13* 11*  ALKPHOS 57   < > 113 78 64  BILITOT 1.4*  --   --  1.1 1.1  PROT 6.5  --   --  6.7 6.0*  ALBUMIN 3.6  --   --  3.2* 2.7*   < > = values in this interval not displayed.   No results for input(s): LIPASE, AMYLASE in the last 8760 hours. No results for input(s): AMMONIA in the last 8760 hours. CBC: Recent Labs    02/08/17 1045  02/09/17 1622 02/10/17 0318 02/11/17 0426  WBC 14.3*  --  8.9 8.4 8.0  NEUTROABS 11.2*  --   --  6.2 5.3  HGB 11.1*   < > 9.6* 9.3* 9.3*  HCT 35.9*   < > 31.5* 28.0* 29.0*  MCV 100.8*  --  103.6* 105.7* 104.3*  PLT 202  --  181 182 207   < > = values in this interval not displayed.   Cardiac Enzymes: Recent Labs    02/09/17 0349 02/09/17 0710  TROPONINI 0.03* <0.03   BNP: Invalid input(s): POCBNP No results found for: HGBA1C Lab Results  Component Value Date   TSH 4.58 10/10/2016   Lab Results  Component  Value Date   VITAMINB12 1,270 01/29/2017   No results found for: FOLATE No results found for: IRON, TIBC, FERRITIN  Imaging and Procedures obtained prior to SNF admission: Nm Hepatobiliary Liver Func  Result Date: 02/09/2017 CLINICAL DATA:  83 year old with right upper quadrant pain and suspect cholecystitis. EXAM: NUCLEAR MEDICINE HEPATOBILIARY IMAGING TECHNIQUE: Sequential images of the abdomen were obtained out to 60 minutes following intravenous administration of radiopharmaceutical. RADIOPHARMACEUTICALS:  5.3 mCi Tc-6515m  Choletec IV COMPARISON:  Ultrasound 02/08/2017 FINDINGS: Prompt uptake and biliary excretion of activity by the liver is seen. Gallbladder activity is visualized, consistent with patency of cystic duct. Biliary activity passes into small bowel, consistent with patent common bile duct. IMPRESSION: Normal hepatobiliary examination.  The cystic duct is patent. Electronically Signed   By: Richarda OverlieAdam  Henn M.D.   On: 02/09/2017  10:24   Dg Chest Port 1 View  Result Date: 02/08/2017 CLINICAL DATA:  Chest pain and shortness of Breath EXAM: PORTABLE CHEST 1 VIEW COMPARISON:  04/04/2014 FINDINGS: Cardiac shadow is mildly enlarged. Large hiatal hernia is seen. Defibrillator is again noted and stable. The lungs are well aerated bilaterally. Persistent thickening of the minor fissure on the right is noted consistent with scarring. No focal infiltrate is seen. IMPRESSION: Large hiatal hernia. Chronic scarring in the right mid lung. Electronically Signed   By: Alcide Clever M.D.   On: 02/08/2017 12:04   Ct Angio Chest/abd/pel For Dissection W And/or Wo Contrast  Result Date: 02/08/2017 CLINICAL DATA:  Dyspnea x3 days. EXAM: CT ANGIOGRAPHY CHEST, ABDOMEN AND PELVIS TECHNIQUE: Multidetector CT imaging through the chest, abdomen and pelvis was performed using the standard protocol during bolus administration of intravenous contrast. Multiplanar reconstructed images and MIPs were obtained and  reviewed to evaluate the vascular anatomy. CONTRAST:  ISOVUE-370 IOPAMIDOL (ISOVUE-370) INJECTION 76% COMPARISON:  Same day CXR.  Chest CT from 02/06/2011. FINDINGS: CTA CHEST FINDINGS Cardiovascular: Stable cardiomegaly with ICD device along the left anterior chest wall and leads in the right atrium coronary sinus and right ventricle. No pericardial effusion. Left main and three-vessel coronary arteriosclerosis. No large central pulmonary embolus. Mediastinum/Nodes: Mild atherosclerosis of the thoracic aorta at the arch and along the descending portion. No aneurysm or dissection. No mediastinal adenopathy. Patent trachea and mainstem bronchi. Large hiatal hernia. Lungs/Pleura: Small bilateral pleural effusions adjacent to the large hiatal hernia. Areas of compressive atelectasis are noted at each lung base with dependent atelectasis along the posterior aspect of both lower lobes. Respiratory motion artifacts limit assessment. Musculoskeletal: Thoracic spondylosis with kyphosis attributable to multilevel degenerative disc disease. No acute nor suspicious osseous abnormality. Left shoulder arthroplasty. Chronic posterior left lower rib fractures. Review of the MIP images confirms the above findings. CTA ABDOMEN AND PELVIS FINDINGS VASCULAR Aorta: No aneurysm or dissection. Moderate atherosclerosis. No significant stenosis. Celiac: Minimal atherosclerosis at the origin. The celiac axis and branch vessels are patent without significant stenosis, aneurysm or dissection. SMA: Atherosclerosis at the origin of the SMA without significant stenosis. No occlusion, dissection or aneurysm. Renals: Atherosclerosis of the origins of both renal arteries without significant luminal narrowing, fibromuscular dysplasia, aneurysm or dissection. IMA: Patent. Inflow: Patent without evidence of aneurysm, dissection, vasculitis or significant stenosis. Mild atherosclerosis of the right common and both internal iliac arteries. Veins:  No obvious venous abnormality within the limitations of this arterial phase study. Review of the MIP images confirms the above findings. NON-VASCULAR Hepatobiliary: No focal liver abnormality is seen. Tiny layering calculi within the physiologically distended gallbladder. No mural thickening or pericholecystic fluid. Pancreas: Normal without ductal dilatation or enhancing mass. Spleen: No splenomegaly or mass. Adrenals/Urinary Tract: Bilateral renal cortical thinning with right interpolar renal cyst measuring 2.9 cm. Mild thickening of the adrenal glands without focal mass. No nephrolithiasis nor hydroureteronephrosis. The urinary bladder is partially obscured by the patient's hip arthroplasty on the left. Stomach/Bowel: Large hiatal hernia as previously mentioned. No bowel obstruction or inflammation. Partial colectomy. Lymphatic: No lymphadenopathy. Reproductive: The lower pelvis is obscured by the patient's left hip arthroplasty. The uterus is either atrophic or surgically absent. No adnexal mass. Other: No free air free fluid. Musculoskeletal: Old posttraumatic deformity of the pubic rami with healing. Dextroscoliosis of the upper thoracic spine. Osteoarthritis of the native right hip. Lumbar spondylosis. Review of the MIP images confirms the above findings. IMPRESSION: 1. Small bilateral pleural effusions  with compressive atelectasis adjacent to a large known hiatal hernia containing much of the stomach. No active pulmonary disease. 2. Minimal thoracic aortic atherosclerosis without aneurysmal dilatation or evidence of dissection. 3. Left main and three-vessel coronary arteriosclerosis. 4. Ectasia of the abdominal aorta with patent branch vessels. Minimal atherosclerosis at the origins of the branch vessels as above described without significant luminal narrowing. 5. Thoracolumbar spondylosis with left hip and shoulder arthroplasties. Lumbar dextroscoliosis. Chronic bilateral pubic rami fractures. 6. 2.9 cm  right interpolar renal cysts with mild cortical thinning of both kidneys. No obstructive uropathy. 7. Uncomplicated appearing cholelithiasis. Electronically Signed   By: Tollie Eth M.D.   On: 02/08/2017 18:02   US Abdomen Limited Ruq  Result Date: 02/08/2017 CLINICAL DATA:  Right upper quadrant abdominal pain. EXAM: ULTRASOUND ABDOMEN LIMITED RIGHT UPPER QUADRANT COMPARISON:  Ultrasound of May 04, 2015. FINDINGS: Gallbladder: 6 mm gallstone is noted. No gallbladder wall thickening or pericholecystic fluid is noted. No sonographic Murphy's sign is noted. Common bile duct: Diameter: 5 mm which is within normal limits. Liver: 8 mm cyst is noted in left hepatic lobe. Within normal limits in parenchymal echogenicity. Portal vein is patent on color Doppler imaging with normal direction of blood flow towards the liver. IMPRESSION: Cholelithiasis without definite evidence of cholecystitis. Small left hepatic cyst. Electronically Signed   By: Lupita Raider, M.D.   On: 02/08/2017 20:43    Assessment/Plan 1. Delirium -on dementia -worse post hospitalization so family now questioning future hospitalization benefits  2. Acute on chronic systolic (congestive) heart failure (HCC) -improved with diuresis, now appears euvolemic -cont losartan, lasix and potassium -await cbc bmp done   4. Vascular dementia without behavioral disturbance -progressing, requires skilled care, more comfort focus encouraged  5. Advance care planning - I spent about 25 minutes meeting with her about Advance Care Planning.  I spent an additional 40 mins reviewing her hospital record, performing her history and exam and coordinating care with the nursing staff.   -do not hospitalize pending further input from family to revise MOST  6. Left arm pain -cause not clear, has had prior shoulder surgeries causing some deformity, tenderness worst in upper arm, but no visible abnormality at this time -cont medications for comfort  including morphine prn, tylenol  Family/ staff Communication: discussed with SNF evening nurse, pt's daughter visiting tonight   Labs/tests ordered: cbc, bmp  Emira Eubanks L. Eliana Lueth, D.O. Geriatrics [facility] [address], [place] [address] Cell Phone (Mon-Fri 8am-5pm):  [phone] On Call:  [phone] & follow prompts after 5pm & weekends Office Phone:  [phone] Office Fax:  [phone]

## 2017-02-13 ENCOUNTER — Telehealth: Payer: Self-pay

## 2017-02-13 DIAGNOSIS — D649 Anemia, unspecified: Secondary | ICD-10-CM | POA: Diagnosis not present

## 2017-02-13 DIAGNOSIS — R509 Fever, unspecified: Secondary | ICD-10-CM | POA: Diagnosis not present

## 2017-02-13 LAB — CULTURE, BLOOD (ROUTINE X 2)
CULTURE: NO GROWTH
Culture: NO GROWTH
SPECIAL REQUESTS: ADEQUATE

## 2017-02-13 NOTE — Telephone Encounter (Signed)
Seen yesterday

## 2017-02-13 NOTE — Telephone Encounter (Signed)
Possible re-admission to facility. This is a patient you were seeing at [facility] . [facility] - Hospital F/U is needed if patient was re-admitted to facility upon discharge. Hospital discharge from [facility] on 02/11/2017

## 2017-02-14 ENCOUNTER — Non-Acute Institutional Stay (SKILLED_NURSING_FACILITY): Payer: Medicare Other | Admitting: Adult Health

## 2017-02-14 ENCOUNTER — Telehealth: Payer: Self-pay | Admitting: Internal Medicine

## 2017-02-14 ENCOUNTER — Encounter: Payer: Self-pay | Admitting: Adult Health

## 2017-02-14 DIAGNOSIS — N183 Chronic kidney disease, stage 3 (moderate): Secondary | ICD-10-CM | POA: Diagnosis not present

## 2017-02-14 DIAGNOSIS — D649 Anemia, unspecified: Secondary | ICD-10-CM | POA: Diagnosis not present

## 2017-02-14 DIAGNOSIS — M1009 Idiopathic gout, multiple sites: Secondary | ICD-10-CM

## 2017-02-14 LAB — BASIC METABOLIC PANEL
BUN: 26 — AB (ref 4–21)
Creatinine: 1 (ref 0.5–1.1)
Glucose: 92
Potassium: 4.9 (ref 3.4–5.3)
Sodium: 140 (ref 137–147)

## 2017-02-14 NOTE — Telephone Encounter (Signed)
New Message     Per [facility] daughter wants to know if this is a necessity to come in for?  Was device not check in hospital ?

## 2017-02-14 NOTE — Telephone Encounter (Signed)
Call returned to [facility] with [facility].  Questioning whether Pt needs 12 mo f/u appt in December, d/t Pt with recent hospitalization.  Per hospital note, Dr. Graciela Husbands interrogated device and made change to hopefully improve EF.  Asked Misty if Pt was feeling better, how was her breathing.  Per Misty at this time Pt having some pain related to gout but breathing is better.  Would like to move appt with Dr. Ladona Ridgel out a month or 2 so Pt can recover from recent hospitalization.  Notified Misty that would be OK, if Pt has any issues please call office to have Pt seen sooner.  Misty indicates understanding.  Message given to scheduling to move appt to February.  No further action needed at this time.

## 2017-02-14 NOTE — Progress Notes (Signed)
Location:  Medical illustrator of Service:  SNF (31) Provider:   [name], ANP [facility] [phone]   [name], DO  Patient Care Team: [name], DO as PCP - General (Geriatric Medicine) [name], NP as Nurse Practitioner (Nurse Practitioner)  Extended Emergency Contact Information Primary Emergency Contact: [name] Address: [address], [address]          [place], [place] [address] [name] Home Phone: [phone] Relation: Other Secondary Emergency Contact: [name] [place] Home Phone: [phone] Relation: Daughter  Code Status:  DNR Goals of care: Advanced Directive information Advanced Directives 02/12/2017  Does Patient Have a Medical Advance Directive? Yes  Type of Advance Directive Out of facility DNR (pink MOST or yellow form);Living will;Healthcare Power of Attorney  Does patient want to make changes to medical advance directive? No - Patient declined  Copy of Healthcare Power of Attorney in Chart? Yes  Pre-existing out of facility DNR order (yellow form or pink MOST form) Yellow form placed in chart (order not valid for inpatient use)     Chief Complaint  Patient presents with  . Acute Visit    left wrist and elbow pain    HPI:  Pt is a 83 y.o. female seen today for an acute visit for left wrist and elbow pain. The staff report that she experienced this pain earlier in the week but there was no swelling or redness. When she woke up this am the left elbow and wrist were red, warm, and swollen. Hx of gout is noted and she is on allopurinol.  Ms. Kaser has advanced dementia and does state that the area is painful but can not elaborate further. There are no reports of fall or injury.   Past Medical History:  Diagnosis Date  . CAD (coronary artery disease)   . Chronic low back pain   . Contusion of foot, right 05/15/12  . Diverticulitis   .  Dyslipidemia   . H/O: hysterectomy   . HTN (hypertension)   . Memory disturbance 05/2012   MMSE 26/30, intact Clock test  . Nonischemic cardiomyopathy (HCC)   . Osteoporosis   . Pulmonary embolism (HCC)   . Right knee pain 05/15/12  . Spinal stenosis   . Ulcer disease   . Weight loss    Past Surgical History:  Procedure Laterality Date  . APPENDECTOMY  2010  . BACK SURGERY    . BIV ICD GENERTAOR CHANGE OUT N/A 05/24/2011   Procedure: BIV ICD GENERTAOR CHANGE OUT;  Surgeon: [name], MD;  Location: [facility] CATH LAB;  Service: Cardiovascular;  Laterality: N/A;  . CATARACT EXTRACTION    . COLON RESECTION  2005  . CORONARY ANGIOPLASTY WITH STENT PLACEMENT  01/2003  . HEMICOLECTOMY    . HIP ARTHROPLASTY Left 04/05/2014   Procedure: ARTHROPLASTY BIPOLAR HIP;  Surgeon: Shelda Pal, MD;  Location: [facility] ORS;  Service: Orthopedics;  Laterality: Left;  . left shoulder replacement  1991   Dr. [name]  . ORIF HIP FRACTURE Left 09/30/2016   Procedure: OPEN REDUCTION INTERNAL FIXATION PERIPROSTHETIC HIP FRACTURE;  Surgeon: Ollen Gross, MD;  Location: [facility] ORS;  Service: Orthopedics;  Laterality: Left;  . right shoulder repair  1985   Dr. [name]  . rotator cuff debridement  1999   Dr. [name]    Allergies  Allergen Reactions  . Arthrotec [Diclofenac-Misoprostol]   . Ibuprofen   . Lactose Intolerance (  Gi)   . Milk-Related Compounds Nausea And Vomiting    Cheese  . Other     Grass and ragweed   . Oxycontin [Oxycodone Hcl]   . Pollen Extract     Outpatient Encounter Medications as of 02/14/2017  Medication Sig  . acetaminophen (TYLENOL) 325 MG tablet Take 650 mg by mouth every 6 (six) hours as needed for mild pain.  Marland Kitchen. albuterol (PROVENTIL HFA;VENTOLIN HFA) 108 (90 Base) MCG/ACT inhaler Inhale 2 puffs into the lungs every 6 (six) hours as needed for wheezing or shortness of breath.  . allopurinol (ZYLOPRIM) 100 MG tablet Take 100 mg by mouth daily.  Marland Kitchen. aspirin EC 81 MG EC tablet Take 1 tablet  (81 mg total) by mouth daily.  . Carboxymethylcellul-Glycerin (OPTIVE) 0.5-0.9 % SOLN Apply 2 drops to eye 2 (two) times daily.  . carvedilol (COREG) 3.125 MG tablet Take 1.5625 mg by mouth 2 (two) times daily with a meal.   . cyanocobalamin 1000 MCG tablet Take 1,000 mcg by mouth daily.   Marland Kitchen. docusate (COLACE) 50 MG/5ML liquid Take 100 mg by mouth daily.  . furosemide (LASIX) 40 MG tablet Take 40 mg by mouth every other day. Alternate with 20mg  lasix  . ipratropium-albuterol (DUONEB) 0.5-2.5 (3) MG/3ML SOLN Take 3 mLs by nebulization every 6 (six) hours as needed (wheezing).   . lactose free nutrition (BOOST PLUS) LIQD Take 237 mLs daily by mouth.   . losartan (COZAAR) 25 MG tablet Take 0.5 tablets (12.5 mg total) by mouth at bedtime.  . nitroGLYCERIN (NITROSTAT) 0.4 MG SL tablet Place 0.4 mg under the tongue every 5 (five) minutes as needed for chest pain.  Marland Kitchen. omeprazole (PRILOSEC OTC) 20 MG tablet Take 20 mg by mouth 2 (two) times daily.  . polyethylene glycol (MIRALAX / GLYCOLAX) packet Take 17 g by mouth daily. Hold for loose stools  . potassium chloride SA (K-DUR,KLOR-CON) 20 MEQ tablet Take 20 mEq by mouth daily.  . sertraline (ZOLOFT) 25 MG tablet Take 25 mg by mouth daily.  . Zinc Oxide (DESITIN) 13 % CREA Apply topically as needed (after each bathroom use).   No facility-administered encounter medications on file as of 02/14/2017.     Review of Systems  Constitutional: Positive for activity change. Negative for appetite change, chills, diaphoresis, fatigue and fever.  Musculoskeletal: Positive for arthralgias, gait problem and joint swelling.  Psychiatric/Behavioral: Positive for confusion. Negative for agitation and behavioral problems.    Immunization History  Administered Date(s) Administered  . Influenza Inj Mdck Quad Pf 01/05/2016  . Influenza-Unspecified 12/22/2014, 01/10/2017  . Pneumococcal Conjugate-13 12/14/2013  . Pneumococcal Polysaccharide-23 03/29/2016  . Tdap  03/29/2016  . Zoster 11/03/2007   Pertinent  Health Maintenance Due  Topic Date Due  . PNA vac Low Risk Adult  Completed  . DEXA SCAN  Discontinued   No flowsheet data found. Functional Status Survey:    Vitals:   02/14/17 1533  BP: 94/62  Pulse: 99  Resp: 19  Temp: 97.6 F (36.4 C)  SpO2: 94%   There is no height or weight on file to calculate BMI. Physical Exam  Constitutional: No distress.  Cardiovascular: Normal rate and regular rhythm.  No murmur heard. No pedal edema  Pulmonary/Chest: Effort normal and breath sounds normal.  Musculoskeletal: She exhibits edema (noted to left wrist, left elbow, and upper arm. Warm andr redness noted as well.) and tenderness (left wrist and left elbow).  Neurological: She is alert.  Oriented to self only  Skin:  Skin is warm and dry. She is not diaphoretic.  Psychiatric: She has a normal mood and affect.    Labs reviewed: Recent Labs    02/08/17 1045  02/09/17 0349 02/10/17 0318 02/11/17 0426  NA  --    < > 138 140 140  K  --    < > 4.2 3.9 4.2  CL  --    < > 106 105 103  CO2  --    < > 27 27 30   GLUCOSE  --    < > 107* 103* 93  BUN  --    < > 20 23* 22*  CREATININE  --    < > 0.94 1.15* 1.14*  CALCIUM  --    < > 7.7* 8.2* 8.1*  MG 2.0  --   --   --   --    < > = values in this interval not displayed.   Recent Labs    09/30/16 0538  01/29/17 0900 02/08/17 1938 02/09/17 0349  AST 28   < > 15 20 15   ALT 8*   < > 7 13* 11*  ALKPHOS 57   < > 113 78 64  BILITOT 1.4*  --   --  1.1 1.1  PROT 6.5  --   --  6.7 6.0*  ALBUMIN 3.6  --   --  3.2* 2.7*   < > = values in this interval not displayed.   Recent Labs    02/08/17 1045  02/09/17 1622 02/10/17 0318 02/11/17 0426  WBC 14.3*  --  8.9 8.4 8.0  NEUTROABS 11.2*  --   --  6.2 5.3  HGB 11.1*   < > 9.6* 9.3* 9.3*  HCT 35.9*   < > 31.5* 28.0* 29.0*  MCV 100.8*  --  103.6* 105.7* 104.3*  PLT 202  --  181 182 207   < > = values in this interval not displayed.   Lab  Results  Component Value Date   TSH 4.58 10/10/2016   No results found for: HGBA1C Lab Results  Component Value Date   CHOL 159 02/09/2017   HDL 49 02/09/2017   LDLCALC 96 02/09/2017   TRIG 69 02/09/2017   CHOLHDL 3.2 02/09/2017    Significant Diagnostic Results in last 30 days:  Dg Chest 2 View  Result Date: 02/11/2017 CLINICAL DATA:  83 year old female with chest pain EXAM: CHEST  2 VIEW COMPARISON:  Recent prior chest x-ray and CT scan of the chest 02/08/2017 FINDINGS: Stable cardiomegaly. Biventricular cardiac rhythm maintenance device remains in unchanged position with leads projecting over the right atrium, right ventricle and overlying the left ventricle. Large hiatal hernia is also similar in appearance compared to prior. Mild pulmonary vascular congestion without overt edema, slightly progressed. Persistent small bilateral pleural effusions and associated bibasilar atelectasis. Left shoulder joint arthroplasty prosthesis. Severe right glenohumeral joint osteoarthritis. IMPRESSION: 1. Stable cardiomegaly with slight interval progression of pulmonary vascular congestion compared to 02/08/2017 but no overt pulmonary edema. 2. Large hiatal hernia. 3. Small bilateral layering pleural effusions and associated bibasilar atelectasis. Electronically Signed   By: Malachy Moan M.D.   On: 02/11/2017 16:33   Nm Hepatobiliary Liver Func  Result Date: 02/09/2017 CLINICAL DATA:  83 year old with right upper quadrant pain and suspect cholecystitis. EXAM: NUCLEAR MEDICINE HEPATOBILIARY IMAGING TECHNIQUE: Sequential images of the abdomen were obtained out to 60 minutes following intravenous administration of radiopharmaceutical. RADIOPHARMACEUTICALS:  5.3 mCi Tc-81m  Choletec IV COMPARISON:  Ultrasound 02/08/2017  FINDINGS: Prompt uptake and biliary excretion of activity by the liver is seen. Gallbladder activity is visualized, consistent with patency of cystic duct. Biliary activity passes into  small bowel, consistent with patent common bile duct. IMPRESSION: Normal hepatobiliary examination.  The cystic duct is patent. Electronically Signed   By: Richarda Overlie M.D.   On: 02/09/2017 10:24   Nm Pulmonary Perf And Vent  Result Date: 02/11/2017 CLINICAL DATA:  Chest pain concern for pulmonary embolism. Elevated D-dimer EXAM: NUCLEAR MEDICINE VENTILATION - PERFUSION LUNG SCAN TECHNIQUE: Ventilation images were obtained in multiple projections using inhaled aerosol Tc-77m DTPA. Perfusion images were obtained in multiple projections after intravenous injection of Tc-75m MAA. RADIOPHARMACEUTICALS:  Thirty-two mCi Technetium-20m DTPA aerosol inhalation and 4.2 mCi Technetium-1m MAA IV COMPARISON:  Chest radiograph 02/11/2017 CT 02/08/2017 FINDINGS: Ventilation: There is extreme heterogeneity to the ventilation pattern with multiple small peripheral relation defects. Perfusion: There is no wedge-shaped peripheral perfusion defect. The profusion pattern is more evenly distributed than the ventilation pattern. IMPRESSION: 1. No evidence acute pulmonary embolism. 2. Patchy ventilation suggests COPD. Electronically Signed   By: Genevive Bi M.D.   On: 02/11/2017 16:31   Dg Chest Port 1 View  Result Date: 02/08/2017 CLINICAL DATA:  Chest pain and shortness of Breath EXAM: PORTABLE CHEST 1 VIEW COMPARISON:  04/04/2014 FINDINGS: Cardiac shadow is mildly enlarged. Large hiatal hernia is seen. Defibrillator is again noted and stable. The lungs are well aerated bilaterally. Persistent thickening of the minor fissure on the right is noted consistent with scarring. No focal infiltrate is seen. IMPRESSION: Large hiatal hernia. Chronic scarring in the right mid lung. Electronically Signed   By: Alcide Clever M.D.   On: 02/08/2017 12:04   Ct Angio Chest/abd/pel For Dissection W And/or Wo Contrast  Result Date: 02/08/2017 CLINICAL DATA:  Dyspnea x3 days. EXAM: CT ANGIOGRAPHY CHEST, ABDOMEN AND PELVIS TECHNIQUE:  Multidetector CT imaging through the chest, abdomen and pelvis was performed using the standard protocol during bolus administration of intravenous contrast. Multiplanar reconstructed images and MIPs were obtained and reviewed to evaluate the vascular anatomy. CONTRAST:  ISOVUE-370 IOPAMIDOL (ISOVUE-370) INJECTION 76% COMPARISON:  Same day CXR.  Chest CT from 02/06/2011. FINDINGS: CTA CHEST FINDINGS Cardiovascular: Stable cardiomegaly with ICD device along the left anterior chest wall and leads in the right atrium coronary sinus and right ventricle. No pericardial effusion. Left main and three-vessel coronary arteriosclerosis. No large central pulmonary embolus. Mediastinum/Nodes: Mild atherosclerosis of the thoracic aorta at the arch and along the descending portion. No aneurysm or dissection. No mediastinal adenopathy. Patent trachea and mainstem bronchi. Large hiatal hernia. Lungs/Pleura: Small bilateral pleural effusions adjacent to the large hiatal hernia. Areas of compressive atelectasis are noted at each lung base with dependent atelectasis along the posterior aspect of both lower lobes. Respiratory motion artifacts limit assessment. Musculoskeletal: Thoracic spondylosis with kyphosis attributable to multilevel degenerative disc disease. No acute nor suspicious osseous abnormality. Left shoulder arthroplasty. Chronic posterior left lower rib fractures. Review of the MIP images confirms the above findings. CTA ABDOMEN AND PELVIS FINDINGS VASCULAR Aorta: No aneurysm or dissection. Moderate atherosclerosis. No significant stenosis. Celiac: Minimal atherosclerosis at the origin. The celiac axis and branch vessels are patent without significant stenosis, aneurysm or dissection. SMA: Atherosclerosis at the origin of the SMA without significant stenosis. No occlusion, dissection or aneurysm. Renals: Atherosclerosis of the origins of both renal arteries without significant luminal narrowing, fibromuscular  dysplasia, aneurysm or dissection. IMA: Patent. Inflow: Patent without evidence of aneurysm, dissection, vasculitis  or significant stenosis. Mild atherosclerosis of the right common and both internal iliac arteries. Veins: No obvious venous abnormality within the limitations of this arterial phase study. Review of the MIP images confirms the above findings. NON-VASCULAR Hepatobiliary: No focal liver abnormality is seen. Tiny layering calculi within the physiologically distended gallbladder. No mural thickening or pericholecystic fluid. Pancreas: Normal without ductal dilatation or enhancing mass. Spleen: No splenomegaly or mass. Adrenals/Urinary Tract: Bilateral renal cortical thinning with right interpolar renal cyst measuring 2.9 cm. Mild thickening of the adrenal glands without focal mass. No nephrolithiasis nor hydroureteronephrosis. The urinary bladder is partially obscured by the patient's hip arthroplasty on the left. Stomach/Bowel: Large hiatal hernia as previously mentioned. No bowel obstruction or inflammation. Partial colectomy. Lymphatic: No lymphadenopathy. Reproductive: The lower pelvis is obscured by the patient's left hip arthroplasty. The uterus is either atrophic or surgically absent. No adnexal mass. Other: No free air free fluid. Musculoskeletal: Old posttraumatic deformity of the pubic rami with healing. Dextroscoliosis of the upper thoracic spine. Osteoarthritis of the native right hip. Lumbar spondylosis. Review of the MIP images confirms the above findings. IMPRESSION: 1. Small bilateral pleural effusions with compressive atelectasis adjacent to a large known hiatal hernia containing much of the stomach. No active pulmonary disease. 2. Minimal thoracic aortic atherosclerosis without aneurysmal dilatation or evidence of dissection. 3. Left main and three-vessel coronary arteriosclerosis. 4. Ectasia of the abdominal aorta with patent branch vessels. Minimal atherosclerosis at the origins of the  branch vessels as above described without significant luminal narrowing. 5. Thoracolumbar spondylosis with left hip and shoulder arthroplasties. Lumbar dextroscoliosis. Chronic bilateral pubic rami fractures. 6. 2.9 cm right interpolar renal cysts with mild cortical thinning of both kidneys. No obstructive uropathy. 7. Uncomplicated appearing cholelithiasis. Electronically Signed   By: Tollie Eth M.D.   On: 02/08/2017 18:02   US Abdomen Limited Ruq  Result Date: 02/08/2017 CLINICAL DATA:  Right upper quadrant abdominal pain. EXAM: ULTRASOUND ABDOMEN LIMITED RIGHT UPPER QUADRANT COMPARISON:  Ultrasound of May 04, 2015. FINDINGS: Gallbladder: 6 mm gallstone is noted. No gallbladder wall thickening or pericholecystic fluid is noted. No sonographic Murphy's sign is noted. Common bile duct: Diameter: 5 mm which is within normal limits. Liver: 8 mm cyst is noted in left hepatic lobe. Within normal limits in parenchymal echogenicity. Portal vein is patent on color Doppler imaging with normal direction of blood flow towards the liver. IMPRESSION: Cholelithiasis without definite evidence of cholecystitis. Small left hepatic cyst. Electronically Signed   By: Lupita Raider, M.D.   On: 02/08/2017 20:43    Assessment/Plan  1) Acute gouty attack to the left wrist and elbow Will avoid nsaids due to allergy, avoid prednisone if possible due to recent hip fracture Colchicine 1.2 mg x 1 dose, then 0.6 mg 1 hr later Uric acid Ice to left elbow and left wrist 15 mint TID x 72 hrs  Family/ staff Communication: nurse  Labs/tests ordered:  Uric acid

## 2017-02-15 DIAGNOSIS — M109 Gout, unspecified: Secondary | ICD-10-CM | POA: Diagnosis not present

## 2017-02-15 DIAGNOSIS — E785 Hyperlipidemia, unspecified: Secondary | ICD-10-CM | POA: Diagnosis not present

## 2017-02-19 ENCOUNTER — Encounter: Payer: Medicare Other | Admitting: Internal Medicine

## 2017-02-20 DIAGNOSIS — M62561 Muscle wasting and atrophy, not elsewhere classified, right lower leg: Secondary | ICD-10-CM | POA: Diagnosis not present

## 2017-02-20 DIAGNOSIS — M62562 Muscle wasting and atrophy, not elsewhere classified, left lower leg: Secondary | ICD-10-CM | POA: Diagnosis not present

## 2017-02-20 DIAGNOSIS — R2689 Other abnormalities of gait and mobility: Secondary | ICD-10-CM | POA: Diagnosis not present

## 2017-02-20 DIAGNOSIS — R278 Other lack of coordination: Secondary | ICD-10-CM | POA: Diagnosis not present

## 2017-02-21 DIAGNOSIS — M62562 Muscle wasting and atrophy, not elsewhere classified, left lower leg: Secondary | ICD-10-CM | POA: Diagnosis not present

## 2017-02-21 DIAGNOSIS — R278 Other lack of coordination: Secondary | ICD-10-CM | POA: Diagnosis not present

## 2017-02-21 DIAGNOSIS — R2689 Other abnormalities of gait and mobility: Secondary | ICD-10-CM | POA: Diagnosis not present

## 2017-02-21 DIAGNOSIS — M62561 Muscle wasting and atrophy, not elsewhere classified, right lower leg: Secondary | ICD-10-CM | POA: Diagnosis not present

## 2017-02-22 DIAGNOSIS — R278 Other lack of coordination: Secondary | ICD-10-CM | POA: Diagnosis not present

## 2017-02-22 DIAGNOSIS — M62561 Muscle wasting and atrophy, not elsewhere classified, right lower leg: Secondary | ICD-10-CM | POA: Diagnosis not present

## 2017-02-22 DIAGNOSIS — R2689 Other abnormalities of gait and mobility: Secondary | ICD-10-CM | POA: Diagnosis not present

## 2017-02-22 DIAGNOSIS — M62562 Muscle wasting and atrophy, not elsewhere classified, left lower leg: Secondary | ICD-10-CM | POA: Diagnosis not present

## 2017-02-26 DIAGNOSIS — R2689 Other abnormalities of gait and mobility: Secondary | ICD-10-CM | POA: Diagnosis not present

## 2017-02-26 DIAGNOSIS — R278 Other lack of coordination: Secondary | ICD-10-CM | POA: Diagnosis not present

## 2017-02-26 DIAGNOSIS — M62561 Muscle wasting and atrophy, not elsewhere classified, right lower leg: Secondary | ICD-10-CM | POA: Diagnosis not present

## 2017-02-26 DIAGNOSIS — M62562 Muscle wasting and atrophy, not elsewhere classified, left lower leg: Secondary | ICD-10-CM | POA: Diagnosis not present

## 2017-02-27 DIAGNOSIS — R2689 Other abnormalities of gait and mobility: Secondary | ICD-10-CM | POA: Diagnosis not present

## 2017-02-27 DIAGNOSIS — M62561 Muscle wasting and atrophy, not elsewhere classified, right lower leg: Secondary | ICD-10-CM | POA: Diagnosis not present

## 2017-02-27 DIAGNOSIS — M62562 Muscle wasting and atrophy, not elsewhere classified, left lower leg: Secondary | ICD-10-CM | POA: Diagnosis not present

## 2017-02-27 DIAGNOSIS — R278 Other lack of coordination: Secondary | ICD-10-CM | POA: Diagnosis not present

## 2017-02-28 DIAGNOSIS — M62561 Muscle wasting and atrophy, not elsewhere classified, right lower leg: Secondary | ICD-10-CM | POA: Diagnosis not present

## 2017-02-28 DIAGNOSIS — M62562 Muscle wasting and atrophy, not elsewhere classified, left lower leg: Secondary | ICD-10-CM | POA: Diagnosis not present

## 2017-02-28 DIAGNOSIS — R2689 Other abnormalities of gait and mobility: Secondary | ICD-10-CM | POA: Diagnosis not present

## 2017-02-28 DIAGNOSIS — R278 Other lack of coordination: Secondary | ICD-10-CM | POA: Diagnosis not present

## 2017-03-01 DIAGNOSIS — R278 Other lack of coordination: Secondary | ICD-10-CM | POA: Diagnosis not present

## 2017-03-01 DIAGNOSIS — M62561 Muscle wasting and atrophy, not elsewhere classified, right lower leg: Secondary | ICD-10-CM | POA: Diagnosis not present

## 2017-03-01 DIAGNOSIS — M62562 Muscle wasting and atrophy, not elsewhere classified, left lower leg: Secondary | ICD-10-CM | POA: Diagnosis not present

## 2017-03-01 DIAGNOSIS — R2689 Other abnormalities of gait and mobility: Secondary | ICD-10-CM | POA: Diagnosis not present

## 2017-03-02 DIAGNOSIS — R278 Other lack of coordination: Secondary | ICD-10-CM | POA: Diagnosis not present

## 2017-03-02 DIAGNOSIS — R2689 Other abnormalities of gait and mobility: Secondary | ICD-10-CM | POA: Diagnosis not present

## 2017-03-02 DIAGNOSIS — M62561 Muscle wasting and atrophy, not elsewhere classified, right lower leg: Secondary | ICD-10-CM | POA: Diagnosis not present

## 2017-03-02 DIAGNOSIS — M62562 Muscle wasting and atrophy, not elsewhere classified, left lower leg: Secondary | ICD-10-CM | POA: Diagnosis not present

## 2017-03-04 DIAGNOSIS — R278 Other lack of coordination: Secondary | ICD-10-CM | POA: Diagnosis not present

## 2017-03-04 DIAGNOSIS — R2689 Other abnormalities of gait and mobility: Secondary | ICD-10-CM | POA: Diagnosis not present

## 2017-03-04 DIAGNOSIS — M62562 Muscle wasting and atrophy, not elsewhere classified, left lower leg: Secondary | ICD-10-CM | POA: Diagnosis not present

## 2017-03-04 DIAGNOSIS — M62561 Muscle wasting and atrophy, not elsewhere classified, right lower leg: Secondary | ICD-10-CM | POA: Diagnosis not present

## 2017-03-05 DIAGNOSIS — R2689 Other abnormalities of gait and mobility: Secondary | ICD-10-CM | POA: Diagnosis not present

## 2017-03-05 DIAGNOSIS — M62562 Muscle wasting and atrophy, not elsewhere classified, left lower leg: Secondary | ICD-10-CM | POA: Diagnosis not present

## 2017-03-05 DIAGNOSIS — R278 Other lack of coordination: Secondary | ICD-10-CM | POA: Diagnosis not present

## 2017-03-05 DIAGNOSIS — M62561 Muscle wasting and atrophy, not elsewhere classified, right lower leg: Secondary | ICD-10-CM | POA: Diagnosis not present

## 2017-03-07 DIAGNOSIS — M62562 Muscle wasting and atrophy, not elsewhere classified, left lower leg: Secondary | ICD-10-CM | POA: Diagnosis not present

## 2017-03-07 DIAGNOSIS — M62561 Muscle wasting and atrophy, not elsewhere classified, right lower leg: Secondary | ICD-10-CM | POA: Diagnosis not present

## 2017-03-07 DIAGNOSIS — R278 Other lack of coordination: Secondary | ICD-10-CM | POA: Diagnosis not present

## 2017-03-07 DIAGNOSIS — R2689 Other abnormalities of gait and mobility: Secondary | ICD-10-CM | POA: Diagnosis not present

## 2017-03-13 DIAGNOSIS — M62562 Muscle wasting and atrophy, not elsewhere classified, left lower leg: Secondary | ICD-10-CM | POA: Diagnosis not present

## 2017-03-13 DIAGNOSIS — M62561 Muscle wasting and atrophy, not elsewhere classified, right lower leg: Secondary | ICD-10-CM | POA: Diagnosis not present

## 2017-03-13 DIAGNOSIS — R2689 Other abnormalities of gait and mobility: Secondary | ICD-10-CM | POA: Diagnosis not present

## 2017-03-13 DIAGNOSIS — R278 Other lack of coordination: Secondary | ICD-10-CM | POA: Diagnosis not present

## 2017-03-15 DIAGNOSIS — R2689 Other abnormalities of gait and mobility: Secondary | ICD-10-CM | POA: Diagnosis not present

## 2017-03-15 DIAGNOSIS — R278 Other lack of coordination: Secondary | ICD-10-CM | POA: Diagnosis not present

## 2017-03-15 DIAGNOSIS — M62562 Muscle wasting and atrophy, not elsewhere classified, left lower leg: Secondary | ICD-10-CM | POA: Diagnosis not present

## 2017-03-15 DIAGNOSIS — M62561 Muscle wasting and atrophy, not elsewhere classified, right lower leg: Secondary | ICD-10-CM | POA: Diagnosis not present

## 2017-04-16 ENCOUNTER — Non-Acute Institutional Stay (SKILLED_NURSING_FACILITY): Payer: Medicare Other | Admitting: Internal Medicine

## 2017-04-16 ENCOUNTER — Encounter: Payer: Self-pay | Admitting: Internal Medicine

## 2017-04-16 DIAGNOSIS — Z0000 Encounter for general adult medical examination without abnormal findings: Secondary | ICD-10-CM | POA: Diagnosis not present

## 2017-04-16 NOTE — Progress Notes (Signed)
Location: [facility] Provider: Emerick Weatherly L. Renato Gails, D.O., C.M.D.  Patient Care Team: [name], DO as PCP - General (Geriatric Medicine) [name], NP as Nurse Practitioner (Nurse Practitioner)  Extended Emergency Contact Information Primary Emergency Contact: [name] Address: [address], [address]          [place], [place] [address] [name] Home Phone: [phone] Relation: Other Secondary Emergency Contact: [name] [place] Home Phone: [phone] Relation: Daughter  Code Status: DNR, MOST form on file Goals of Care: Advanced Directive information Advanced Directives 04/16/2017  Does Patient Have a Medical Advance Directive? Yes  Type of Advance Directive Out of facility DNR (pink MOST or yellow form);Healthcare Power of [place];Living will  Does patient want to make changes to medical advance directive? No - Patient declined  Copy of Healthcare Power of Attorney in Chart? Yes  Pre-existing out of facility DNR order (yellow form or pink MOST form) Yellow form placed in chart (order not valid for inpatient use);Pink MOST form placed in chart (order not valid for inpatient use)   Chief Complaint  Patient presents with  . Annual Exam    wellness exam    HPI: Patient is a 84 y.o. female seen in today for an annual wellness exam.    Depression screen PHQ 2/9 04/26/2017  Decreased Interest 0  Down, Depressed, Hopeless 0  PHQ - 2 Score 0  Altered sleeping 0  Tired, decreased energy 1  Change in appetite 0  Feeling bad or failure about yourself  0  Trouble concentrating 0  Moving slowly or fidgety/restless 0  Suicidal thoughts 0  PHQ-9 Score 1  Difficult doing work/chores Not difficult at all    No flowsheet data found. MMSE - Mini Mental State Exam 04/16/2017 06/04/2012  Orientation to time 0 3  Orientation to Place 4 5  Registration 3 3  Attention/ Calculation 2 5  Attention/Calculation-comments -  spelled WORLD backwards  Recall 0 1  Language- name 2 objects 2 2  Language- repeat 1 1  Language- follow 3 step command 3 3  Language- read & follow direction 1 1  Write a sentence 1 1  Copy design 1 1  Total score 18 26  failed clock drawing.   Health Maintenance  Topic Date Due  . TETANUS/TDAP  10/02/2026  . PNA vac Low Risk Adult  Completed  . DEXA SCAN  Discontinued   Functional Status Survey: Is the patient deaf or have difficulty hearing?: Yes Does the patient have difficulty seeing, even when wearing glasses/contacts?: No Does the patient have difficulty concentrating, remembering, or making decisions?: Yes Does the patient have difficulty walking or climbing stairs?: Yes Does the patient have difficulty dressing or bathing?: Yes Does the patient have difficulty doing errands alone such as visiting a doctor's office or shopping?: Yes Current Exercise Habits: The patient does not participate in regular exercise at present Exercise limited by: psychological condition(s);neurologic condition(s);orthopedic condition(s) FRAIL-NH Score Fatigue: 1 Resistance: 2 Ambulation : 1 Incontinence : 2 Loss of Weight: 0 Nutritional Approach : 1 Help with Dressing: 2  Total: 9 (04/26/2017 10:57 AM)  Hearing:HOH Dentition:no issues at present  Past Medical History:  Diagnosis Date  . CAD (coronary artery disease)   . Chronic low back pain   . Contusion of foot, right 05/15/12  . Diverticulitis   . Dyslipidemia   . H/O: hysterectomy   . HTN (hypertension)   . Memory disturbance 05/2012   MMSE 26/30,  intact Clock test  . Nonischemic cardiomyopathy (HCC)   . Osteoporosis   . Pulmonary embolism (HCC)   . Right knee pain 05/15/12  . Spinal stenosis   . Ulcer disease   . Weight loss     Past Surgical History:  Procedure Laterality Date  . APPENDECTOMY  2010  . BACK SURGERY    . BIV ICD GENERTAOR CHANGE OUT N/A 05/24/2011   Procedure: BIV ICD GENERTAOR CHANGE OUT;  Surgeon:  [name], MD;  Location: [facility] CATH LAB;  Service: Cardiovascular;  Laterality: N/A;  . CATARACT EXTRACTION    . COLON RESECTION  2005  . CORONARY ANGIOPLASTY WITH STENT PLACEMENT  01/2003  . HEMICOLECTOMY    . HIP ARTHROPLASTY Left 04/05/2014   Procedure: ARTHROPLASTY BIPOLAR HIP;  Surgeon: Shelda Pal, MD;  Location: [facility];  Service: Orthopedics;  Laterality: Left;  . left shoulder replacement  1991   Dr. [name]  . ORIF HIP FRACTURE Left 09/30/2016   Procedure: OPEN REDUCTION INTERNAL FIXATION PERIPROSTHETIC HIP FRACTURE;  Surgeon: Ollen Gross, MD;  Location: [facility];  Service: Orthopedics;  Laterality: Left;  . right shoulder repair  1985   Dr. [name]  . rotator cuff debridement  1999   Dr. [name]    Family History  Problem Relation Age of Onset  . Coronary artery disease Sister   . Heart attack Father 34  . Heart attack Mother   . Other Mother        abdominal blockage    Social History   Socioeconomic History  . Marital status: Widowed    Spouse name: None  . Number of children: None  . Years of education: None  . Highest education level: None  Social Needs  . Financial resource strain: None  . Food insecurity - worry: None  . Food insecurity - inability: None  . Transportation needs - medical: None  . Transportation needs - non-medical: None  Occupational History  . None  Tobacco Use  . Smoking status: Never Smoker  . Smokeless tobacco: Never Used  Substance and Sexual Activity  . Alcohol use: No  . Drug use: No  . Sexual activity: None  Other Topics Concern  . None  Social History Narrative  . None    reports that  has never smoked. she has never used smokeless tobacco. She reports that she does not drink alcohol or use drugs.  Allergies  Allergen Reactions  . Arthrotec [Diclofenac-Misoprostol]   . Ibuprofen   . Lactose Intolerance (Gi)   . Milk-Related Compounds Nausea And Vomiting    Cheese  . Other     Grass and ragweed   . Oxycontin  [Oxycodone Hcl]   . Pollen Extract     Outpatient Encounter Medications as of 04/16/2017  Medication Sig  . acetaminophen (TYLENOL) 325 MG tablet Take 650 mg by mouth every 6 (six) hours as needed for mild pain.  Marland Kitchen albuterol (PROVENTIL HFA;VENTOLIN HFA) 108 (90 Base) MCG/ACT inhaler Inhale 2 puffs into the lungs every 6 (six) hours as needed for wheezing or shortness of breath.  . allopurinol (ZYLOPRIM) 100 MG tablet Take 100 mg by mouth daily.  Marland Kitchen aspirin EC 81 MG tablet Take 81 mg by mouth daily.  . Carboxymethylcellul-Glycerin (OPTIVE) 0.5-0.9 % SOLN Apply 2 drops to eye 2 (two) times daily.  . carvedilol (COREG) 3.125 MG tablet Take 1.5625 mg by mouth 2 (two) times daily with a meal.   . cyanocobalamin 1000 MCG tablet Take 1,000  mcg by mouth daily.   Marland Kitchen docusate (COLACE) 50 MG/5ML liquid Take 100 mg by mouth daily.  . furosemide (LASIX) 40 MG tablet Take 40 mg by mouth every other day. Alternate with 20mg  lasix  . ipratropium-albuterol (DUONEB) 0.5-2.5 (3) MG/3ML SOLN Take 3 mLs by nebulization every 6 (six) hours as needed (wheezing).   . lactose free nutrition (BOOST PLUS) LIQD Take 237 mLs daily by mouth.   Marland Kitchen LORazepam (ATIVAN) 2 MG/ML concentrated solution Take 0.5 mg by mouth every 6 (six) hours as needed for anxiety.  Marland Kitchen losartan (COZAAR) 25 MG tablet Take 0.5 tablets (12.5 mg total) by mouth at bedtime.  Marland Kitchen morphine 20 MG/5ML solution Take 5 mg by mouth every 4 (four) hours as needed for pain.  . nitroGLYCERIN (NITROSTAT) 0.4 MG SL tablet Place 0.4 mg under the tongue every 5 (five) minutes as needed for chest pain.  Marland Kitchen omeprazole (PRILOSEC OTC) 20 MG tablet Take 20 mg by mouth 2 (two) times daily.  . polyethylene glycol (MIRALAX / GLYCOLAX) packet Take 17 g by mouth daily. Hold for loose stools  . potassium chloride SA (K-DUR,KLOR-CON) 20 MEQ tablet Take 20 mEq by mouth daily.  . sertraline (ZOLOFT) 25 MG tablet Take 25 mg by mouth daily.  . Zinc Oxide (DESITIN) 13 % CREA Apply  topically as needed (after each bathroom use).   No facility-administered encounter medications on file as of 04/16/2017.      Review of Systems:  Review of Systems  Unable to perform ROS: Dementia (info from nursing staff)  Constitutional: Positive for malaise/fatigue. Negative for chills and fever.  HENT: Positive for hearing loss. Negative for congestion.   Respiratory: Negative for cough and shortness of breath.   Cardiovascular: Negative for chest pain, palpitations and leg swelling.       No recent chest pain  Gastrointestinal: Positive for constipation. Negative for abdominal pain.  Genitourinary: Negative for dysuria.  Musculoskeletal: Positive for joint pain. Negative for falls.       Shoulder pain, right knee pain  Skin: Negative for rash.  Neurological: Positive for weakness. Negative for dizziness.  Psychiatric/Behavioral: Positive for memory loss. Negative for depression. The patient is not nervous/anxious and does not have insomnia.     Physical Exam: Vitals:   04/16/17 1503  BP: 106/69  Pulse: 83  Resp: 18  Temp: (!) 96.7 F (35.9 C)  TempSrc: Oral  SpO2: 94%  Weight: 166 lb (75.3 kg)   Body mass index is 27.62 kg/m. Physical Exam  Constitutional: She appears well-developed and well-nourished. No distress.  Cardiovascular: Normal rate, regular rhythm and intact distal pulses.  Pulmonary/Chest: Effort normal and breath sounds normal. No respiratory distress. She has no rales.  Abdominal: Bowel sounds are normal.  Musculoskeletal:  Decreased ROM left shoulder chronically; uses wheelchair and spends a lot of time in bed   Neurological: She is alert.  Skin: Skin is warm and dry.  Psychiatric: She has a normal mood and affect.    Labs reviewed: Basic Metabolic Panel: Recent Labs    10/10/16  02/08/17 1045  02/09/17 0349 02/10/17 0318 02/11/17 0426 02/14/17 0800  NA  --    < >  --    < > 138 140 140 140  K  --    < >  --    < > 4.2 3.9 4.2 4.9  CL   --   --   --    < > 106 105 103  --  CO2  --   --   --    < > 27 27 30   --   GLUCOSE  --   --   --    < > 107* 103* 93  --   BUN  --    < >  --    < > 20 23* 22* 26*  CREATININE  --    < >  --    < > 0.94 1.15* 1.14* 1.0  CALCIUM  --   --   --    < > 7.7* 8.2* 8.1*  --   MG  --   --  2.0  --   --   --   --   --   TSH 4.58  --   --   --   --   --   --   --    < > = values in this interval not displayed.   Liver Function Tests: Recent Labs    09/30/16 0538  01/29/17 0900 02/08/17 1938 02/09/17 0349  AST 28   < > 15 20 15   ALT 8*   < > 7 13* 11*  ALKPHOS 57   < > 113 78 64  BILITOT 1.4*  --   --  1.1 1.1  PROT 6.5  --   --  6.7 6.0*  ALBUMIN 3.6  --   --  3.2* 2.7*   < > = values in this interval not displayed.   No results for input(s): LIPASE, AMYLASE in the last 8760 hours. No results for input(s): AMMONIA in the last 8760 hours. CBC: Recent Labs    02/08/17 1045  02/09/17 1622 02/10/17 0318 02/11/17 0426  WBC 14.3*  --  8.9 8.4 8.0  NEUTROABS 11.2*  --   --  6.2 5.3  HGB 11.1*   < > 9.6* 9.3* 9.3*  HCT 35.9*   < > 31.5* 28.0* 29.0*  MCV 100.8*  --  103.6* 105.7* 104.3*  PLT 202  --  181 182 207   < > = values in this interval not displayed.   Lipid Panel: Recent Labs    02/09/17 0349  CHOL 159  HDL 49  LDLCALC 96  TRIG 69  CHOLHDL 3.2   Assessment/Plan 1. Medicare annual wellness visit, subsequent -performed today, up to date on age-appropriate preventive care -limited by her degree of dementia, most information coming from chart and nursing staff  Labs/tests ordered:  No new  Daenerys Buttram L. Adamarys Shall, D.O. Geriatrics [facility] [address], [place] [address] Cell Phone (Mon-Fri 8am-5pm):  [phone] On Call:  [phone] & follow prompts after 5pm & weekends Office Phone:  [phone] Office Fax:  [phone]

## 2017-04-23 ENCOUNTER — Ambulatory Visit: Payer: Medicare Other | Admitting: Internal Medicine

## 2017-04-23 ENCOUNTER — Encounter (INDEPENDENT_AMBULATORY_CARE_PROVIDER_SITE_OTHER): Payer: Self-pay

## 2017-04-23 ENCOUNTER — Encounter: Payer: Self-pay | Admitting: Internal Medicine

## 2017-04-23 ENCOUNTER — Other Ambulatory Visit: Payer: Self-pay | Admitting: *Deleted

## 2017-04-23 VITALS — BP 124/74 | HR 92 | Ht 65.0 in | Wt 168.0 lb

## 2017-04-23 DIAGNOSIS — Z950 Presence of cardiac pacemaker: Secondary | ICD-10-CM | POA: Diagnosis not present

## 2017-04-23 DIAGNOSIS — I495 Sick sinus syndrome: Secondary | ICD-10-CM | POA: Diagnosis not present

## 2017-04-23 NOTE — Patient Instructions (Signed)
Medication Instructions:  Your physician recommends that you continue on your current medications as directed. Please refer to the Current Medication list given to you today.  Labwork: None ordered.  Testing/Procedures: None ordered.  Follow-Up: Your physician wants you to follow-up in: one year with Dr. Taylor.   You will receive a reminder letter in the mail two months in advance. If you don't receive a letter, please call our office to schedule the follow-up appointment.  Remote monitoring is used to monitor your Pacemaker from home. This monitoring reduces the number of office visits required to check your device to one time per year. It allows us to keep an eye on the functioning of your device to ensure it is working properly. /You are scheduled for a device check from home on 07/23/2017. You may send your transmission at any time that day. If you have a wireless device, the transmission will be sent automatically. After your physician reviews your transmission, you will receive a postcard with your next transmission date.  Any Other Special Instructions Will Be Listed Below (If Applicable).  If you need a refill on your cardiac medications before your next appointment, please call your pharmacy.   

## 2017-04-23 NOTE — Progress Notes (Signed)
HPI [name] returns today for followup of her PPM. She is a pleasant 84 yo woman with a h/o CHB, s/p PPM insertion. In the interim, she has broken her leg and made a full recovery. She denies chest pain or sob. She admits to being more sedentary.  Allergies  Allergen Reactions  . Arthrotec [Diclofenac-Misoprostol]   . Ibuprofen   . Lactose Intolerance (Gi)   . Milk-Related Compounds Nausea And Vomiting    Cheese  . Other     Grass and ragweed   . Oxycontin [Oxycodone Hcl]   . Pollen Extract      Current Outpatient Medications  Medication Sig Dispense Refill  . acetaminophen (TYLENOL) 325 MG tablet Take 650 mg by mouth every 6 (six) hours as needed for mild pain.    Marland Kitchen albuterol (PROVENTIL HFA;VENTOLIN HFA) 108 (90 Base) MCG/ACT inhaler Inhale 2 puffs into the lungs every 6 (six) hours as needed for wheezing or shortness of breath.    . allopurinol (ZYLOPRIM) 100 MG tablet Take 100 mg by mouth daily.    Marland Kitchen aspirin EC 81 MG tablet Take 81 mg by mouth daily.    . Carboxymethylcellul-Glycerin (OPTIVE) 0.5-0.9 % SOLN Apply 2 drops to eye 2 (two) times daily.    . carvedilol (COREG) 3.125 MG tablet Take 1.5625 mg by mouth 2 (two) times daily with a meal.     . cyanocobalamin 1000 MCG tablet Take 1,000 mcg by mouth daily.     Marland Kitchen docusate (COLACE) 50 MG/5ML liquid Take 100 mg by mouth daily.    . furosemide (LASIX) 20 MG tablet Take 20 mg by mouth every other day. Alternate between 40mg  every other day  0  . furosemide (LASIX) 40 MG tablet Take 40 mg by mouth every other day. Alternate with 20mg  lasix    . ipratropium-albuterol (DUONEB) 0.5-2.5 (3) MG/3ML SOLN Take 3 mLs by nebulization every 6 (six) hours as needed (wheezing).     . lactose free nutrition (BOOST PLUS) LIQD Take 237 mLs daily by mouth.     Marland Kitchen LORazepam (ATIVAN) 2 MG/ML concentrated solution Take 0.5 mg by mouth every 6 (six) hours as needed for anxiety.    Marland Kitchen losartan (COZAAR) 25 MG tablet Take 0.5 tablets (12.5 mg  total) by mouth at bedtime. 15 tablet 0  . morphine 20 MG/5ML solution Take 5 mg by mouth every 4 (four) hours as needed for pain.    . nitroGLYCERIN (NITROSTAT) 0.4 MG SL tablet Place 0.4 mg under the tongue every 5 (five) minutes as needed for chest pain.    Marland Kitchen omeprazole (PRILOSEC OTC) 20 MG tablet Take 20 mg by mouth 2 (two) times daily.    . polyethylene glycol (MIRALAX / GLYCOLAX) packet Take 17 g by mouth daily. Hold for loose stools    . potassium chloride SA (K-DUR,KLOR-CON) 20 MEQ tablet Take 20 mEq by mouth daily.    . sertraline (ZOLOFT) 25 MG tablet Take 25 mg by mouth daily.    . Zinc Oxide (DESITIN) 13 % CREA Apply topically as needed (after each bathroom use).     No current facility-administered medications for this visit.      Past Medical History:  Diagnosis Date  . CAD (coronary artery disease)   . Chronic low back pain   . Contusion of foot, right 05/15/12  . Diverticulitis   . Dyslipidemia   . H/O: hysterectomy   . HTN (hypertension)   . Memory disturbance 05/2012  MMSE 26/30, intact Clock test  . Nonischemic cardiomyopathy (HCC)   . Osteoporosis   . Pulmonary embolism (HCC)   . Right knee pain 05/15/12  . Spinal stenosis   . Ulcer disease   . Weight loss     ROS:   All systems reviewed and negative except as noted in the HPI.   Past Surgical History:  Procedure Laterality Date  . APPENDECTOMY  2010  . BACK SURGERY    . BIV ICD GENERTAOR CHANGE OUT N/A 05/24/2011   Procedure: BIV ICD GENERTAOR CHANGE OUT;  Surgeon: [name], MD;  Location: [facility] CATH LAB;  Service: Cardiovascular;  Laterality: N/A;  . CATARACT EXTRACTION    . COLON RESECTION  2005  . CORONARY ANGIOPLASTY WITH STENT PLACEMENT  01/2003  . HEMICOLECTOMY    . HIP ARTHROPLASTY Left 04/05/2014   Procedure: ARTHROPLASTY BIPOLAR HIP;  Surgeon: Shelda Pal, MD;  Location: [facility];  Service: Orthopedics;  Laterality: Left;  . left shoulder replacement  1991   Dr. [name]  . ORIF HIP FRACTURE  Left 09/30/2016   Procedure: OPEN REDUCTION INTERNAL FIXATION PERIPROSTHETIC HIP FRACTURE;  Surgeon: Ollen Gross, MD;  Location: [facility];  Service: Orthopedics;  Laterality: Left;  . right shoulder repair  1985   Dr. [name]  . rotator cuff debridement  1999   Dr. [name]     Family History  Problem Relation Age of Onset  . Coronary artery disease Sister   . Heart attack Father 106  . Heart attack Mother   . Other Mother        abdominal blockage     Social History   Socioeconomic History  . Marital status: Widowed    Spouse name: Not on file  . Number of children: Not on file  . Years of education: Not on file  . Highest education level: Not on file  Social Needs  . Financial resource strain: Not on file  . Food insecurity - worry: Not on file  . Food insecurity - inability: Not on file  . Transportation needs - medical: Not on file  . Transportation needs - non-medical: Not on file  Occupational History  . Not on file  Tobacco Use  . Smoking status: Never Smoker  . Smokeless tobacco: Never Used  Substance and Sexual Activity  . Alcohol use: No  . Drug use: No  . Sexual activity: Not on file  Other Topics Concern  . Not on file  Social History Narrative  . Not on file     BP 124/74   Pulse 92   Ht 5\' 5"  (1.651 m)   Wt 168 lb (76.2 kg)   SpO2 93%   BMI 27.96 kg/m   Physical Exam:  Well appearing elderly woman, NAD HEENT: Unremarkable Neck:  6 cm JVD, no thyromegally Lymphatics:  No adenopathy Back:  No CVA tenderness Lungs:  Clear with no wheezes HEART:  Regular rate rhythm, no murmurs, no rubs, no clicks Abd:  soft, positive bowel sounds, no organomegally, no rebound, no guarding Ext:  2 plus pulses, no edema, no cyanosis, no clubbing Skin:  No rashes no nodules Neuro:  CN II through XII intact, motor grossly intact  EKG - none  DEVICE  Normal device function.  See PaceArt for details.   Assess/Plan: 1. CHB - she is asymptomatic, s/p PPM  insertion. 2. PPM - her [employer] DDD PM is working normally. Will recheck in several months. 3. HTN - her blood pressure  is well controlled. She will continue her current meds.  Leonia Reeves.D.

## 2017-04-26 NOTE — ACP (Advance Care Planning) (Signed)
MOST form is on file for DNR, comfort measures, antibiotics and fluids to be determined case by case.  Tube feeding box not checked one way or another.   Also has living will indicating no tube feeding in paper chart.  HCPOA also on file.  These are in the document list.

## 2017-05-03 ENCOUNTER — Non-Acute Institutional Stay (SKILLED_NURSING_FACILITY): Payer: Medicare Other | Admitting: Adult Health

## 2017-05-03 ENCOUNTER — Encounter: Payer: Self-pay | Admitting: Adult Health

## 2017-05-03 DIAGNOSIS — J101 Influenza due to other identified influenza virus with other respiratory manifestations: Secondary | ICD-10-CM

## 2017-05-03 DIAGNOSIS — G4483 Primary cough headache: Secondary | ICD-10-CM | POA: Diagnosis not present

## 2017-05-03 NOTE — Progress Notes (Signed)
Location:  Medical illustrator of Service:  SNF (31) Provider:   [name], ANP [facility] [phone]   [name], DO  Patient Care Team: [name], DO as PCP - General (Geriatric Medicine) [name], NP as Nurse Practitioner (Nurse Practitioner)  Extended Emergency Contact Information Primary Emergency Contact: [name] Address: [address]          [place], [place] [address] [name] Home Phone: [phone] Relation: Other Secondary Emergency Contact: [name] [place] Home Phone: [phone] Relation: Daughter  Code Status:  DNR Goals of care: Advanced Directive information Advanced Directives 04/16/2017  Does Patient Have a Medical Advance Directive? Yes  Type of Advance Directive Out of facility DNR (pink MOST or yellow form);Healthcare Power of [place];Living will  Does patient want to make changes to medical advance directive? No - Patient declined  Copy of Healthcare Power of Attorney in Chart? Yes  Pre-existing out of facility DNR order (yellow form or pink MOST form) Yellow form placed in chart (order not valid for inpatient use);Pink MOST form placed in chart (order not valid for inpatient use)     Chief Complaint  Patient presents with  . Acute Visit    cough, fever    HPI:  Pt is a 84 y.o. female seen today for an acute visit for cough and fever. The staff reports that she has a congested cough, decreased appetite, and temp of 100.1 for 1 day. She has had sick contacts with Influenza A and is on prophylactic dosing of Tamiflu 30 mg qd for 14 days. She denies any abd pain, n, v, d, sob, or cp.  She chronically wears oxygen at 2L and sats are 99%.  She seems denies feeling unwell and is alert and pleasant for my visit, however, due to her dementia she is a poor historian.   Flu swab pos for Influenza A  Past Medical History:  Diagnosis Date    . CAD (coronary artery disease)   . Chronic low back pain   . Contusion of foot, right 05/15/12  . Diverticulitis   . Dyslipidemia   . H/O: hysterectomy   . HTN (hypertension)   . Memory disturbance 05/2012   MMSE 26/30, intact Clock test  . Nonischemic cardiomyopathy (HCC)   . Osteoporosis   . Pulmonary embolism (HCC)   . Right knee pain 05/15/12  . Spinal stenosis   . Ulcer disease   . Weight loss    Past Surgical History:  Procedure Laterality Date  . APPENDECTOMY  2010  . BACK SURGERY    . BIV ICD GENERTAOR CHANGE OUT N/A 05/24/2011   Procedure: BIV ICD GENERTAOR CHANGE OUT;  Surgeon: [name], MD;  Location: [facility] C[facility] LAB;  Service: Cardiovascular;  Laterality: N/A;  . CATARACT EXTRACTION    . COLON RESECTION  2005  . CORONARY ANGIOPLASTY WITH STENT PLACEMENT  01/2003  . HEMICOLECTOMY    . HIP ARTHROPLASTY Left 04/05/2014   Procedure: ARTHROPLASTY BIPOLAR HIP;  Surgeon: Shelda Pal, MD;  Location: [facility] ORS;  Service: Orthopedics;  Laterality: Left;  . left shoulder replacement  1991   Dr. [name]  . ORIF HIP FRACTURE Left 09/30/2016   Procedure: OPEN REDUCTION INTERNAL FIXATION PERIPROSTHETIC HIP FRACTURE;  Surgeon: Ollen Gross, MD;  Location: [facility] ORS;  Service: Orthopedics;  Laterality: Left;  . right shoulder repair  1985   Dr. [name]  . rotator cuff  debridement  1999   Dr. [name]    Allergies  Allergen Reactions  . Arthrotec [Diclofenac-Misoprostol]   . Ibuprofen   . Lactose Intolerance (Gi)   . Milk-Related Compounds Nausea And Vomiting    Cheese  . Other     Grass and ragweed   . Oxycontin [Oxycodone Hcl]   . Pollen Extract     Outpatient Encounter Medications as of 05/03/2017  Medication Sig  . acetaminophen (TYLENOL) 325 MG tablet Take 650 mg by mouth every 6 (six) hours as needed for mild pain.  Marland Kitchen albuterol (PROVENTIL HFA;VENTOLIN HFA) 108 (90 Base) MCG/ACT inhaler Inhale 2 puffs into the lungs every 6 (six) hours as needed for wheezing or shortness of  breath.  . allopurinol (ZYLOPRIM) 100 MG tablet Take 100 mg by mouth daily.  Marland Kitchen aspirin EC 81 MG tablet Take 81 mg by mouth daily.  . Carboxymethylcellul-Glycerin (OPTIVE) 0.5-0.9 % SOLN Apply 2 drops to eye 2 (two) times daily.  . carvedilol (COREG) 3.125 MG tablet Take 1.5625 mg by mouth 2 (two) times daily with a meal.   . cyanocobalamin 1000 MCG tablet Take 1,000 mcg by mouth daily.   Marland Kitchen docusate (COLACE) 50 MG/5ML liquid Take 100 mg by mouth daily.  . furosemide (LASIX) 20 MG tablet Take 20 mg by mouth every other day. Alternate between 40mg  every other day  . furosemide (LASIX) 40 MG tablet Take 40 mg by mouth every other day. Alternate with 20mg  lasix  . ipratropium-albuterol (DUONEB) 0.5-2.5 (3) MG/3ML SOLN Take 3 mLs by nebulization every 6 (six) hours as needed (wheezing).   . lactose free nutrition (BOOST PLUS) LIQD Take 237 mLs daily by mouth.   Marland Kitchen LORazepam (ATIVAN) 2 MG/ML concentrated solution Take 0.5 mg by mouth every 6 (six) hours as needed for anxiety.  Marland Kitchen losartan (COZAAR) 25 MG tablet Take 0.5 tablets (12.5 mg total) by mouth at bedtime.  Marland Kitchen morphine 20 MG/5ML solution Take 5 mg by mouth every 4 (four) hours as needed for pain.  . nitroGLYCERIN (NITROSTAT) 0.4 MG SL tablet Place 0.4 mg under the tongue every 5 (five) minutes as needed for chest pain.  Marland Kitchen omeprazole (PRILOSEC OTC) 20 MG tablet Take 20 mg by mouth 2 (two) times daily.  . polyethylene glycol (MIRALAX / GLYCOLAX) packet Take 17 g by mouth daily. Hold for loose stools  . potassium chloride SA (K-DUR,KLOR-CON) 20 MEQ tablet Take 20 mEq by mouth daily.  . sertraline (ZOLOFT) 25 MG tablet Take 25 mg by mouth daily.  . Zinc Oxide (DESITIN) 13 % CREA Apply topically as needed (after each bathroom use).   No facility-administered encounter medications on file as of 05/03/2017.     Review of Systems  Constitutional: Positive for appetite change and fever. Negative for activity change, chills, diaphoresis and fatigue.    HENT: Positive for congestion. Negative for ear discharge, ear pain, hearing loss, rhinorrhea, sinus pressure, sinus pain, sore throat and trouble swallowing.   Respiratory: Positive for cough. Negative for shortness of breath, wheezing and stridor.   Cardiovascular: Negative for chest pain, palpitations and leg swelling.  Gastrointestinal: Negative for abdominal distention, abdominal pain, diarrhea and nausea.  Genitourinary: Negative for dysuria.  Musculoskeletal: Positive for gait problem.  Psychiatric/Behavioral: Positive for confusion. Negative for agitation and behavioral problems.    Immunization History  Administered Date(s) Administered  . Influenza Inj Mdck Quad Pf 01/05/2016  . Influenza-Unspecified 12/22/2014, 01/10/2017  . Pneumococcal Conjugate-13 12/14/2013  . Pneumococcal Polysaccharide-23 03/29/2016  . Tdap  03/29/2016  . Zoster 11/03/2007   Pertinent  Health Maintenance Due  Topic Date Due  . PNA vac Low Risk Adult  Completed  . DEXA SCAN  Discontinued   No flowsheet data found. Functional Status Survey:    Vitals:   05/03/17 1606  BP: 119/76  Pulse: 96  Resp: (!) 22  Temp: 100.1 F (37.8 C)  SpO2: 99%   There is no height or weight on file to calculate BMI. Physical Exam  Constitutional: No distress.  HENT:  Nose: Nose normal.  Mouth/Throat: Oropharynx is clear and moist. No oropharyngeal exudate.  Cerumen in both ears  Pulmonary/Chest: She has rales (bases bilat).  Abdominal: Soft. Bowel sounds are normal.  Musculoskeletal: She exhibits no edema or tenderness.  Lymphadenopathy:    She has no cervical adenopathy.  Neurological: She is alert.  Oriented to self only which is baseline for her  Skin: Skin is warm and dry. She is not diaphoretic.  Psychiatric: She has a normal mood and affect.  Vitals reviewed.   Labs reviewed: Recent Labs    02/08/17 1045  02/09/17 0349 02/10/17 0318 02/11/17 0426 02/14/17 0800  NA  --    < > 138 140 140  140  K  --    < > 4.2 3.9 4.2 4.9  CL  --    < > 106 105 103  --   CO2  --    < > 27 27 30   --   GLUCOSE  --    < > 107* 103* 93  --   BUN  --    < > 20 23* 22* 26*  CREATININE  --    < > 0.94 1.15* 1.14* 1.0  CALCIUM  --    < > 7.7* 8.2* 8.1*  --   MG 2.0  --   --   --   --   --    < > = values in this interval not displayed.   Recent Labs    09/30/16 0538  01/29/17 0900 02/08/17 1938 02/09/17 0349  AST 28   < > 15 20 15   ALT 8*   < > 7 13* 11*  ALKPHOS 57   < > 113 78 64  BILITOT 1.4*  --   --  1.1 1.1  PROT 6.5  --   --  6.7 6.0*  ALBUMIN 3.6  --   --  3.2* 2.7*   < > = values in this interval not displayed.   Recent Labs    02/08/17 1045  02/09/17 1622 02/10/17 0318 02/11/17 0426  WBC 14.3*  --  8.9 8.4 8.0  NEUTROABS 11.2*  --   --  6.2 5.3  HGB 11.1*   < > 9.6* 9.3* 9.3*  HCT 35.9*   < > 31.5* 28.0* 29.0*  MCV 100.8*  --  103.6* 105.7* 104.3*  PLT 202  --  181 182 207   < > = values in this interval not displayed.   Lab Results  Component Value Date   TSH 4.58 10/10/2016   No results found for: HGBA1C Lab Results  Component Value Date   CHOL 159 02/09/2017   HDL 49 02/09/2017   LDLCALC 96 02/09/2017   TRIG 69 02/09/2017   CHOLHDL 3.2 02/09/2017    Significant Diagnostic Results in last 30 days:  No results found.  Assessment/Plan  1) Influenza A Change dosing from prophylactic to treatment 30 mg BID for 5 days Monitor VS is  decreased 02 sats or increased work of breathing report to [facility] for CXR Continue oxygen at 2L Push fluids Use duonebs q 6 hrs prn cough/wheezing Luckily she does not seem quite as ill as other have been in her unit with the illness    Family/ staff Communication: discussed with nurse  Labs/tests ordered:  NA

## 2017-05-13 ENCOUNTER — Non-Acute Institutional Stay (SKILLED_NURSING_FACILITY): Payer: Medicare Other | Admitting: Adult Health

## 2017-05-13 DIAGNOSIS — K5901 Slow transit constipation: Secondary | ICD-10-CM | POA: Diagnosis not present

## 2017-05-13 DIAGNOSIS — F0150 Vascular dementia without behavioral disturbance: Secondary | ICD-10-CM

## 2017-05-13 DIAGNOSIS — I5022 Chronic systolic (congestive) heart failure: Secondary | ICD-10-CM | POA: Diagnosis not present

## 2017-05-13 DIAGNOSIS — K449 Diaphragmatic hernia without obstruction or gangrene: Secondary | ICD-10-CM | POA: Diagnosis not present

## 2017-05-13 DIAGNOSIS — M1A0320 Idiopathic chronic gout, left wrist, without tophus (tophi): Secondary | ICD-10-CM

## 2017-05-16 ENCOUNTER — Encounter: Payer: Self-pay | Admitting: Adult Health

## 2017-05-16 DIAGNOSIS — K449 Diaphragmatic hernia without obstruction or gangrene: Secondary | ICD-10-CM | POA: Insufficient documentation

## 2017-05-16 NOTE — Progress Notes (Signed)
Location:  Medical illustrator of Service:  SNF (31) Provider:   [name], ANP [facility] [phone]   [name], DO  Patient Care Team: [name], DO as PCP - General (Geriatric Medicine) [name], NP as Nurse Practitioner (Nurse Practitioner)  Extended Emergency Contact Information Primary Emergency Contact: [name] Address: [address], [address]          [place], [place] [address] [name] Home Phone: [phone] Relation: Other Secondary Emergency Contact: [name] [place] Home Phone: [phone] Relation: Daughter  Code Status:  DNR Goals of care: Advanced Directive information Advanced Directives 04/16/2017  Does Patient Have a Medical Advance Directive? Yes  Type of Advance Directive Out of facility DNR (pink MOST or yellow form);Healthcare Power of [place];Living will  Does patient want to make changes to medical advance directive? No - Patient declined  Copy of Healthcare Power of Attorney in Chart? Yes  Pre-existing out of facility DNR order (yellow form or pink MOST form) Yellow form placed in chart (order not valid for inpatient use);Pink MOST form placed in chart (order not valid for inpatient use)     Chief Complaint  Patient presents with  . Medical Management of Chronic Issues    HPI:  Pt is a 84 y.o. female seen today for medical management of chronic diseases.  She resides in skilled care and also has 1:1 private caregivers. She had Influenza A on 05/03/17 and was treated with Tamiflu.  She is feeling much better and is currently 95% on RA. Her cough and other symptoms have subsided.   CHF: EF 20% Nov of 2018.  Her weight is trendind down most likely due to previous acute illness. No sob or cp.  Wt Readings from Last 3 Encounters:  05/16/17 163 lb 14.4 oz (74.3 kg)  04/23/17 168 lb (76.2 kg)  04/16/17 166 lb (75.3 kg)   HTN:  controlled  Dementia: MMSE 18/30. She remains pleasant and able to f/c.    Constipation: no reports of issues  Gout: no new events  Currently on allopurinol  Hx of large hiatal hernia, takes prilosec  Past Medical History:  Diagnosis Date  . CAD (coronary artery disease)   . Chronic low back pain   . Contusion of foot, right 05/15/12  . Diverticulitis   . Dyslipidemia   . H/O: hysterectomy   . HTN (hypertension)   . Memory disturbance 05/2012   MMSE 26/30, intact Clock test  . Nonischemic cardiomyopathy (HCC)   . Osteoporosis   . Pulmonary embolism (HCC)   . Right knee pain 05/15/12  . Spinal stenosis   . Ulcer disease   . Weight loss    Past Surgical History:  Procedure Laterality Date  . APPENDECTOMY  2010  . BACK SURGERY    . BIV ICD GENERTAOR CHANGE OUT N/A 05/24/2011   Procedure: BIV ICD GENERTAOR CHANGE OUT;  Surgeon: [name], MD;  Location: [facility] CATH LAB;  Service: Cardiovascular;  Laterality: N/A;  . CATARACT EXTRACTION    . COLON RESECTION  2005  . CORONARY ANGIOPLASTY WITH STENT PLACEMENT  01/2003  . HEMICOLECTOMY    . HIP ARTHROPLASTY Left 04/05/2014   Procedure: ARTHROPLASTY BIPOLAR HIP;  Surgeon: Shelda Pal, MD;  Location: [facility];  Service: Orthopedics;  Laterality: Left;  . left shoulder replacement  1991   Dr. [name]  . ORIF HIP FRACTURE Left 09/30/2016   Procedure: OPEN  REDUCTION INTERNAL FIXATION PERIPROSTHETIC HIP FRACTURE;  Surgeon: Ollen Gross, MD;  Location: [facility] ORS;  Service: Orthopedics;  Laterality: Left;  . right shoulder repair  1985   Dr. [name]  . rotator cuff debridement  1999   Dr. [name]    Allergies  Allergen Reactions  . Arthrotec [Diclofenac-Misoprostol]   . Ibuprofen   . Lactose Intolerance (Gi)   . Milk-Related Compounds Nausea And Vomiting    Cheese  . Other     Grass and ragweed   . Oxycontin [Oxycodone Hcl]   . Pollen Extract     Outpatient Encounter Medications as of 05/13/2017  Medication Sig  . acetaminophen  (TYLENOL) 325 MG tablet Take 650 mg by mouth every 6 (six) hours as needed for mild pain.  Marland Kitchen albuterol (PROVENTIL HFA;VENTOLIN HFA) 108 (90 Base) MCG/ACT inhaler Inhale 2 puffs into the lungs every 6 (six) hours as needed for wheezing or shortness of breath.  . allopurinol (ZYLOPRIM) 100 MG tablet Take 100 mg by mouth daily.  Marland Kitchen aspirin EC 81 MG tablet Take 81 mg by mouth daily.  . Carboxymethylcellul-Glycerin (OPTIVE) 0.5-0.9 % SOLN Apply 2 drops to eye 2 (two) times daily.  . carvedilol (COREG) 3.125 MG tablet Take 1.5625 mg by mouth 2 (two) times daily with a meal.   . cyanocobalamin 1000 MCG tablet Take 1,000 mcg by mouth daily.   Marland Kitchen docusate (COLACE) 50 MG/5ML liquid Take 100 mg by mouth daily.  . furosemide (LASIX) 20 MG tablet Take 20 mg by mouth every other day. Alternate between 40mg  every other day  . furosemide (LASIX) 40 MG tablet Take 40 mg by mouth every other day. Alternate with 20mg  lasix  . ipratropium-albuterol (DUONEB) 0.5-2.5 (3) MG/3ML SOLN Take 3 mLs by nebulization every 6 (six) hours as needed (wheezing).   . lactose free nutrition (BOOST PLUS) LIQD Take 237 mLs by mouth 2 (two) times daily between meals.   Marland Kitchen LORazepam (ATIVAN) 2 MG/ML concentrated solution Take 0.5 mg by mouth every 6 (six) hours as needed for anxiety.  Marland Kitchen losartan (COZAAR) 25 MG tablet Take 0.5 tablets (12.5 mg total) by mouth at bedtime.  Marland Kitchen morphine 20 MG/5ML solution Take 5 mg by mouth every 4 (four) hours as needed for pain.  . nitroGLYCERIN (NITROSTAT) 0.4 MG SL tablet Place 0.4 mg under the tongue every 5 (five) minutes as needed for chest pain.  Marland Kitchen omeprazole (PRILOSEC OTC) 20 MG tablet Take 20 mg by mouth 2 (two) times daily.  . polyethylene glycol (MIRALAX / GLYCOLAX) packet Take 17 g by mouth daily. Hold for loose stools  . potassium chloride SA (K-DUR,KLOR-CON) 20 MEQ tablet Take 20 mEq by mouth daily.  . sertraline (ZOLOFT) 25 MG tablet Take 25 mg by mouth daily.  . Zinc Oxide (DESITIN) 13 % CREA  Apply topically as needed (after each bathroom use).  . [DISCONTINUED] oseltamivir (TAMIFLU) 30 MG capsule Take 30 mg by mouth daily.   No facility-administered encounter medications on file as of 05/13/2017.     Review of Systems  Immunization History  Administered Date(s) Administered  . Influenza Inj Mdck Quad Pf 01/05/2016  . Influenza-Unspecified 12/22/2014, 01/10/2017  . Pneumococcal Conjugate-13 12/14/2013  . Pneumococcal Polysaccharide-23 03/29/2016  . Tdap 03/29/2016  . Zoster 11/03/2007   Pertinent  Health Maintenance Due  Topic Date Due  . PNA vac Low Risk Adult  Completed  . DEXA SCAN  Discontinued   No flowsheet data found. Functional Status Survey:    Vitals:  05/16/17 1601  BP: 121/77  Pulse: 76  Resp: 14  Temp: (!) 96.7 F (35.9 C)  SpO2: 95%  Weight: 163 lb 14.4 oz (74.3 kg)   Body mass index is 27.27 kg/m. Physical Exam  Constitutional: No distress.  HENT:  Head: Normocephalic and atraumatic.  Nose: Nose normal.  Mouth/Throat: No oropharyngeal exudate.  Neck: No JVD present.  Cardiovascular: Normal rate and regular rhythm.  No murmur heard. Pulmonary/Chest: Effort normal. No respiratory distress. She has no wheezes. She has rales (bilat bases).  Abdominal: Soft. Bowel sounds are normal. She exhibits no distension.  Lymphadenopathy:    She has no cervical adenopathy.  Neurological: She is alert.  Oriented to self and place but not time  Skin: Skin is warm and dry. She is not diaphoretic.  Psychiatric: She has a normal mood and affect.    Labs reviewed: Recent Labs    02/08/17 1045  02/09/17 0349 02/10/17 0318 02/11/17 0426 02/14/17 0800  NA  --    < > 138 140 140 140  K  --    < > 4.2 3.9 4.2 4.9  CL  --    < > 106 105 103  --   CO2  --    < > 27 27 30   --   GLUCOSE  --    < > 107* 103* 93  --   BUN  --    < > 20 23* 22* 26*  CREATININE  --    < > 0.94 1.15* 1.14* 1.0  CALCIUM  --    < > 7.7* 8.2* 8.1*  --   MG 2.0  --   --    --   --   --    < > = values in this interval not displayed.   Recent Labs    09/30/16 0538  01/29/17 0900 02/08/17 1938 02/09/17 0349  AST 28   < > 15 20 15   ALT 8*   < > 7 13* 11*  ALKPHOS 57   < > 113 78 64  BILITOT 1.4*  --   --  1.1 1.1  PROT 6.5  --   --  6.7 6.0*  ALBUMIN 3.6  --   --  3.2* 2.7*   < > = values in this interval not displayed.   Recent Labs    02/08/17 1045  02/09/17 1622 02/10/17 0318 02/11/17 0426  WBC 14.3*  --  8.9 8.4 8.0  NEUTROABS 11.2*  --   --  6.2 5.3  HGB 11.1*   < > 9.6* 9.3* 9.3*  HCT 35.9*   < > 31.5* 28.0* 29.0*  MCV 100.8*  --  103.6* 105.7* 104.3*  PLT 202  --  181 182 207   < > = values in this interval not displayed.   Lab Results  Component Value Date   TSH 4.58 10/10/2016   No results found for: HGBA1C Lab Results  Component Value Date   CHOL 159 02/09/2017   HDL 49 02/09/2017   LDLCALC 96 02/09/2017   TRIG 69 02/09/2017   CHOLHDL 3.2 02/09/2017    Significant Diagnostic Results in last 30 days:  No results found.  Assessment/Plan  Chronic systolic heart failure (HCC) Compensated. Continue Lasix alternating 40 mg with 20 mg qod. Continue ARB/BB therapy.  Dementia Decline in MMSE scores over time. She would not benefit from medication for her memory. She remains pleasant and able to communicate.   Constipation Continue Colace 100 mg  and Miralax 17 grams qd  Gout Continue Allopurinol 100 mg qd  Hiatal hernia Continue prilosec 20 mg qd   Family/ staff Communication: discussed with the resident and her caretaker  Labs/tests ordered:  NA

## 2017-05-16 NOTE — Assessment & Plan Note (Signed)
Decline in MMSE scores over time. She would not benefit from medication for her memory. She remains pleasant and able to communicate.

## 2017-05-16 NOTE — Assessment & Plan Note (Signed)
Continue prilosec 20 mg qd.  

## 2017-05-16 NOTE — Assessment & Plan Note (Signed)
Continue Allopurinol 100 mg qd

## 2017-05-16 NOTE — Assessment & Plan Note (Addendum)
Continue Colace 100 mg and Miralax 17 grams qd

## 2017-05-16 NOTE — Assessment & Plan Note (Addendum)
Compensated. Continue Lasix alternating 40 mg with 20 mg qod. Continue ARB/BB therapy.

## 2017-05-17 LAB — CUP PACEART INCLINIC DEVICE CHECK
Battery Remaining Longevity: 65 mo
Battery Voltage: 2.77 V
Brady Statistic AP VP Percent: 11 %
Brady Statistic AS VP Percent: 89 %
Implantable Lead Implant Date: 20071005
Implantable Lead Location: 753858
Implantable Lead Model: 5076
Implantable Pulse Generator Implant Date: 20130307
Lead Channel Impedance Value: 413 Ohm
Lead Channel Pacing Threshold Amplitude: 1 V
Lead Channel Pacing Threshold Pulse Width: 0.4 ms
Lead Channel Pacing Threshold Pulse Width: 0.4 ms
Lead Channel Setting Pacing Amplitude: 2.5 V
Lead Channel Setting Pacing Pulse Width: 0.4 ms
MDC IDC LEAD IMPLANT DT: 20071005
MDC IDC LEAD LOCATION: 753859
MDC IDC MSMT BATTERY IMPEDANCE: 736 Ohm
MDC IDC MSMT LEADCHNL RA SENSING INTR AMPL: 2 mV
MDC IDC MSMT LEADCHNL RV IMPEDANCE VALUE: 793 Ohm
MDC IDC MSMT LEADCHNL RV PACING THRESHOLD AMPLITUDE: 0.75 V
MDC IDC MSMT LEADCHNL RV SENSING INTR AMPL: 22.4 mV
MDC IDC SESS DTM: 20190205165813
MDC IDC SET LEADCHNL RA PACING AMPLITUDE: 2 V
MDC IDC SET LEADCHNL RV SENSING SENSITIVITY: 5.6 mV
MDC IDC STAT BRADY AP VS PERCENT: 0 %
MDC IDC STAT BRADY AS VS PERCENT: 0 %

## 2017-06-11 ENCOUNTER — Encounter: Payer: Self-pay | Admitting: Internal Medicine

## 2017-06-11 ENCOUNTER — Non-Acute Institutional Stay (SKILLED_NURSING_FACILITY): Payer: Medicare Other | Admitting: Internal Medicine

## 2017-06-11 DIAGNOSIS — Z0000 Encounter for general adult medical examination without abnormal findings: Secondary | ICD-10-CM

## 2017-06-11 DIAGNOSIS — F0150 Vascular dementia without behavioral disturbance: Secondary | ICD-10-CM | POA: Diagnosis not present

## 2017-06-11 DIAGNOSIS — I5022 Chronic systolic (congestive) heart failure: Secondary | ICD-10-CM | POA: Diagnosis not present

## 2017-06-11 DIAGNOSIS — K5901 Slow transit constipation: Secondary | ICD-10-CM

## 2017-06-11 DIAGNOSIS — N183 Chronic kidney disease, stage 3 unspecified: Secondary | ICD-10-CM

## 2017-06-11 NOTE — Progress Notes (Signed)
Patient ID: [name], female   DOB: 1920-09-05, 84 y.o.   MRN: [id number]  Provider:   Location: [facility] Room Number: 111 Place of Service:  SNF (31)   PCP: [name], DO Patient Care Team: [name], DO as PCP - General (Geriatric Medicine) [name], NP as Nurse Practitioner (Nurse Practitioner)  Extended Emergency Contact Information Primary Emergency Contact: [name] Address: [address]          [place], [place] [address] [name] Home Phone: [phone] Relation: Other Secondary Emergency Contact: [name] [place] Home Phone: [phone] Relation: Daughter  Code Status: DNR  Goals of Care: Advanced Directive information Advanced Directives 06/11/2017  Does Patient Have a Medical Advance Directive? Yes  Type of Advance Directive Out of facility DNR (pink MOST or yellow form);Living will;Healthcare Power of Attorney  Does patient want to make changes to medical advance directive? No - Patient declined  Copy of Healthcare Power of Attorney in Chart? Yes  Pre-existing out of facility DNR order (yellow form or pink MOST form) Pink MOST form placed in chart (order not valid for inpatient use)     Chief Complaint  Patient presents with  . Annual Exam    CPE    HPI: Patient is a 84 y.o. female seen today for an annual comprehensive examination. She is doing well and without complaints. Visit took place in her room where she was found reclining and in the company of a care giver.  She denies chest pain, SOB, or leg swelling. She is pleasant in conversation. She reports eating well, but cant recall what she had for breakfast which was several hours ago. She is well groomed and has her nails painted.   Per nursing: She is a good breakfast eater, but picks the rest of the day. She takes her pills crushed. She is maintaining weight. She is incontinent of both  bladder and bowel. She wears a depends. She is a one person assist, no reported falls. She has no skin changes. She is forgetful and will repeat herself a lot, but over all does well with being alert and reoriented.   Past Medical History:  Diagnosis Date  . CAD (coronary artery disease)   . Chronic low back pain   . Contusion of foot, right 05/15/12  . Diverticulitis   . Dyslipidemia   . H/O: hysterectomy   . HTN (hypertension)   . Memory disturbance 05/2012   MMSE 26/30, intact Clock test  . Nonischemic cardiomyopathy (HCC)   . Osteoporosis   . Pulmonary embolism (HCC)   . Right knee pain 05/15/12  . Spinal stenosis   . Ulcer disease   . Weight loss    Past Surgical History:  Procedure Laterality Date  . APPENDECTOMY  2010  . BACK SURGERY    . BIV ICD GENERTAOR CHANGE OUT N/A 05/24/2011   Procedure: BIV ICD GENERTAOR CHANGE OUT;  Surgeon: [name], MD;  Location: [facility] CATH LAB;  Service: Cardiovascular;  Laterality: N/A;  . CATARACT EXTRACTION    . COLON RESECTION  2005  . CORONARY ANGIOPLASTY WITH STENT PLACEMENT  01/2003  . HEMICOLECTOMY    . HIP ARTHROPLASTY Left 04/05/2014   Procedure: ARTHROPLASTY BIPOLAR HIP;  Surgeon: Shelda Pal, MD;  Location: [facility] ORS;  Service: Orthopedics;  Laterality: Left;  . left shoulder replacement  1991   Dr. [name]  . ORIF HIP FRACTURE Left 09/30/2016  Procedure: OPEN REDUCTION INTERNAL FIXATION PERIPROSTHETIC HIP FRACTURE;  Surgeon: Ollen Gross, MD;  Location: [facility] ORS;  Service: Orthopedics;  Laterality: Left;  . right shoulder repair  1985   Dr. [name]  . rotator cuff debridement  1999   Dr. [name]    reports that she has never smoked. She has never used smokeless tobacco. She reports that she does not drink alcohol or use drugs. Social History   Socioeconomic History  . Marital status: Widowed    Spouse name: Not on file  . Number of children: Not on file  . Years of education: Not on file  . Highest education level: Not on file    Occupational History  . Not on file  Social Needs  . Financial resource strain: Not on file  . Food insecurity:    Worry: Not on file    Inability: Not on file  . Transportation needs:    Medical: Not on file    Non-medical: Not on file  Tobacco Use  . Smoking status: Never Smoker  . Smokeless tobacco: Never Used  Substance and Sexual Activity  . Alcohol use: No  . Drug use: No  . Sexual activity: Not on file  Lifestyle  . Physical activity:    Days per week: Not on file    Minutes per session: Not on file  . Stress: Not on file  Relationships  . Social connections:    Talks on phone: Not on file    Gets together: Not on file    Attends religious service: Not on file    Active member of club or organization: Not on file    Attends meetings of clubs or organizations: Not on file    Relationship status: Not on file  Other Topics Concern  . Not on file  Social History Narrative  . Not on file   Family History  Problem Relation Age of Onset  . Coronary artery disease Sister   . Heart attack Father 59  . Heart attack Mother   . Other Mother        abdominal blockage    Pertinent  Health Maintenance Due  Topic Date Due  . PNA vac Low Risk Adult  Completed  . DEXA SCAN  Discontinued   No flowsheet data found. Depression screen PHQ 2/9 04/26/2017  Decreased Interest 0  Down, Depressed, Hopeless 0  PHQ - 2 Score 0  Altered sleeping 0  Tired, decreased energy 1  Change in appetite 0  Feeling bad or failure about yourself  0  Trouble concentrating 0  Moving slowly or fidgety/restless 0  Suicidal thoughts 0  PHQ-9 Score 1  Difficult doing work/chores Not difficult at all    Functional Status Survey:    Allergies  Allergen Reactions  . Arthrotec [Diclofenac-Misoprostol]   . Ibuprofen   . Lactose Intolerance (Gi)   . Milk-Related Compounds Nausea And Vomiting    Cheese  . Other     Grass and ragweed   . Oxycontin [Oxycodone Hcl]   . Pollen Extract      Outpatient Encounter Medications as of 06/11/2017  Medication Sig  . acetaminophen (TYLENOL) 325 MG tablet Take 650 mg by mouth every 6 (six) hours as needed for mild pain.  Marland Kitchen albuterol (PROVENTIL HFA;VENTOLIN HFA) 108 (90 Base) MCG/ACT inhaler Inhale 2 puffs into the lungs every 6 (six) hours as needed for wheezing or shortness of breath.  . allopurinol (ZYLOPRIM) 100 MG tablet Take 100 mg by mouth  daily.  . aspirin EC 81 MG tablet Take 81 mg by mouth daily.  . Carboxymethylcellul-Glycerin (OPTIVE) 0.5-0.9 % SOLN Apply 2 drops to eye 2 (two) times daily.  . carvedilol (COREG) 3.125 MG tablet Take 1.5625 mg by mouth 2 (two) times daily with a meal.   . cyanocobalamin 1000 MCG tablet Take 1,000 mcg by mouth daily.   Marland Kitchen docusate (COLACE) 50 MG/5ML liquid Take 100 mg by mouth daily.  . furosemide (LASIX) 20 MG tablet Take 20 mg by mouth every other day. Alternate between 40mg  every other day  . furosemide (LASIX) 40 MG tablet Take 40 mg by mouth every other day. Alternate with 20mg  lasix  . ipratropium-albuterol (DUONEB) 0.5-2.5 (3) MG/3ML SOLN Take 3 mLs by nebulization every 6 (six) hours as needed (wheezing).   . lactose free nutrition (BOOST PLUS) LIQD Take 237 mLs by mouth 2 (two) times daily between meals.   Marland Kitchen LORazepam (ATIVAN) 2 MG/ML concentrated solution Take 0.5 mg by mouth every 6 (six) hours as needed for anxiety.  Marland Kitchen losartan (COZAAR) 25 MG tablet Take 0.5 tablets (12.5 mg total) by mouth at bedtime.  Marland Kitchen morphine 20 MG/5ML solution Take 5 mg by mouth every 4 (four) hours as needed for pain.  . nitroGLYCERIN (NITROSTAT) 0.4 MG SL tablet Place 0.4 mg under the tongue every 5 (five) minutes as needed for chest pain.  Marland Kitchen omeprazole (PRILOSEC OTC) 20 MG tablet Take 20 mg by mouth 2 (two) times daily.  . polyethylene glycol (MIRALAX / GLYCOLAX) packet Take 17 g by mouth daily. Hold for loose stools  . potassium chloride SA (K-DUR,KLOR-CON) 20 MEQ tablet Take 20 mEq by mouth daily.  .  sertraline (ZOLOFT) 25 MG tablet Take 25 mg by mouth daily.  . Zinc Oxide (DESITIN) 13 % CREA Apply topically as needed (after each bathroom use).   No facility-administered encounter medications on file as of 06/11/2017.     Review of Systems  Constitutional: Negative for activity change, appetite change, chills, fatigue and fever.  Respiratory: Negative for cough and shortness of breath.   Cardiovascular: Negative for chest pain, palpitations and leg swelling.  Gastrointestinal: Negative for abdominal distention, abdominal pain, constipation and diarrhea.  Genitourinary: Negative for dysuria.  Musculoskeletal: Positive for gait problem. Negative for arthralgias, back pain, joint swelling and myalgias.  Skin: Negative.   Neurological: Negative for dizziness and headaches.  Psychiatric/Behavioral: Positive for confusion. Negative for behavioral problems and sleep disturbance.    Vitals:   06/11/17 1431  BP: 103/66  Pulse: 84  Resp: 20  Temp: (!) 97.5 F (36.4 C)  TempSrc: Oral  SpO2: 95%  Weight: 165 lb (74.8 kg)  Height: 5\' 5"  (1.651 m)   Body mass index is 27.46 kg/m. Physical Exam  Constitutional: She appears well-developed and well-nourished. No distress.  HENT:  Head: Normocephalic.  Right Ear: External ear normal.  Left Ear: External ear normal.  Eyes: Right eye exhibits no discharge. Left eye exhibits no discharge.  Neck: Normal range of motion. Neck supple. No thyromegaly present.  Cardiovascular: Normal rate, regular rhythm, normal heart sounds and intact distal pulses.  No murmur heard. Pulmonary/Chest: Effort normal and breath sounds normal. No respiratory distress.  Abdominal: Soft. Bowel sounds are normal. She exhibits no distension. There is no tenderness.  Musculoskeletal: Normal range of motion.  Lymphadenopathy:    She has no cervical adenopathy.  Neurological: She is alert. She exhibits normal muscle tone.  Baseline dementia MMSE 18/30 (Jan/2019)   Skin: Skin  is warm and dry.  Psychiatric: She has a normal mood and affect.  Vitals reviewed.   Labs reviewed: Basic Metabolic Panel: Recent Labs    02/08/17 1045  02/09/17 0349 02/10/17 0318 02/11/17 0426 02/14/17 0800  NA  --    < > 138 140 140 140  K  --    < > 4.2 3.9 4.2 4.9  CL  --    < > 106 105 103  --   CO2  --    < > 27 27 30   --   GLUCOSE  --    < > 107* 103* 93  --   BUN  --    < > 20 23* 22* 26*  CREATININE  --    < > 0.94 1.15* 1.14* 1.0  CALCIUM  --    < > 7.7* 8.2* 8.1*  --   MG 2.0  --   --   --   --   --    < > = values in this interval not displayed.   Liver Function Tests: Recent Labs    09/30/16 0538  01/29/17 0900 02/08/17 1938 02/09/17 0349  AST 28   < > 15 20 15   ALT 8*   < > 7 13* 11*  ALKPHOS 57   < > 113 78 64  BILITOT 1.4*  --   --  1.1 1.1  PROT 6.5  --   --  6.7 6.0*  ALBUMIN 3.6  --   --  3.2* 2.7*   < > = values in this interval not displayed.   No results for input(s): LIPASE, AMYLASE in the last 8760 hours. No results for input(s): AMMONIA in the last 8760 hours. CBC: Recent Labs    02/08/17 1045  02/09/17 1622 02/10/17 0318 02/11/17 0426  WBC 14.3*  --  8.9 8.4 8.0  NEUTROABS 11.2*  --   --  6.2 5.3  HGB 11.1*   < > 9.6* 9.3* 9.3*  HCT 35.9*   < > 31.5* 28.0* 29.0*  MCV 100.8*  --  103.6* 105.7* 104.3*  PLT 202  --  181 182 207   < > = values in this interval not displayed.   Cardiac Enzymes: Recent Labs    02/09/17 0349 02/09/17 0710  TROPONINI 0.03* <0.03   BNP: Invalid input(s): POCBNP No results found for: HGBA1C Lab Results  Component Value Date   TSH 4.58 10/10/2016   Lab Results  Component Value Date   VITAMINB12 1,270 01/29/2017   No results found for: FOLATE No results found for: IRON, TIBC, FERRITIN  Imaging and Procedures obtained recently: No results found.  Assessment/Plan  1. Routine general medical examination at a health care facility Performed today. On exam she has not  demonstrated changes in physical condition. She continued to progressively decline in memory.   2. Vascular dementia without behavioral disturbance She is had progression of her dementia. She requires assistance with most ADL's now. She can feed self.   3. Chronic systolic heart failure (HCC) She has CHF and appears euvolemic today on exam.  Will continue coreg, losartan, lasix, and potassium  Will assess cbc and bmp at future labs  4. Slow transit constipation Miralax ordered, will continue this as she has not issue with constipation  While on it. She is also on colace. Will continue both .  5. CKD (chronic kidney disease) stage 3, GFR 30-59 ml/min (HCC) She has a GFR of 40.  No acute changes  seen today. Continue to avoid nephrotoxic agents like nsaids, dose adjust renally excreted meds, hydrate.    Family/ staff Communication: spoke with nurse  Labs/tests ordered: none

## 2017-07-08 ENCOUNTER — Encounter: Payer: Self-pay | Admitting: Adult Health

## 2017-07-08 ENCOUNTER — Non-Acute Institutional Stay (SKILLED_NURSING_FACILITY): Payer: Medicare Other | Admitting: Adult Health

## 2017-07-08 DIAGNOSIS — M1A0320 Idiopathic chronic gout, left wrist, without tophus (tophi): Secondary | ICD-10-CM

## 2017-07-08 DIAGNOSIS — L089 Local infection of the skin and subcutaneous tissue, unspecified: Secondary | ICD-10-CM

## 2017-07-08 NOTE — Progress Notes (Signed)
Location:  [facility] Room Number: 111 Place of Service:  SNF [phone]) Provider:  Linton Rump, RN, AGPCNP-Student/ [name], ANP-BC   [name], DO  Patient Care Team: [name], DO as PCP - General (Geriatric Medicine) [name], NP as Nurse Practitioner (Nurse Practitioner)  Extended Emergency Contact Information Primary Emergency Contact: [name] Address: [address], [address]          [place], [place] [address] [name] Home Phone: [phone] Relation: Other Secondary Emergency Contact: [name] [place] Home Phone: [phone] Relation: Daughter  Code Status:  DNR Goals of care: Advanced Directive information Advanced Directives 06/11/2017  Does Patient Have a Medical Advance Directive? Yes  Type of Advance Directive Out of facility DNR (pink MOST or yellow form);Living will;Healthcare Power of Attorney  Does patient want to make changes to medical advance directive? No - Patient declined  Copy of Healthcare Power of Attorney in Chart? Yes  Pre-existing out of facility DNR order (yellow form or pink MOST form) Pink MOST form placed in chart (order not valid for inpatient use)     Chief Complaint  Patient presents with  . Joint Swelling    HPI:  Pt is a 84 y.o. female seen today for an acute visit for inflammation of the right Great Toe in FU for staffs concern for in-grown toe nail and gout flare. On call provider consulted over the weekend (4/20) for the above issue, Doxycycline x 7 days and Prednisone x 3 days ordered. 3 day course of Prednisone completed. RN/caregiver state the inflammation described as swelling and redness was not worse than it was over the weekend, but was also not any better. Pt states that it is painful to touch, but does not bother her otherwise. The patient is wheelchair bound, only standing on the toe for a stand and pivot.      Past Medical History:  Diagnosis Date  . CAD (coronary artery disease)   . Chronic low back pain   . Contusion of foot, right 05/15/12  . Diverticulitis   . Dyslipidemia   . H/O: hysterectomy   . HTN (hypertension)   . Memory disturbance 05/2012   MMSE 26/30, intact Clock test  . Nonischemic cardiomyopathy (HCC)   . Osteoporosis   . Pulmonary embolism (HCC)   . Right knee pain 05/15/12  . Spinal stenosis   . Ulcer disease   . Weight loss    Past Surgical History:  Procedure Laterality Date  . APPENDECTOMY  2010  . BACK SURGERY    . BIV ICD GENERTAOR CHANGE OUT N/A 05/24/2011   Procedure: BIV ICD GENERTAOR CHANGE OUT;  Surgeon: [name], MD;  Location: [facility] CATH LAB;  Service: Cardiovascular;  Laterality: N/A;  . CATARACT EXTRACTION    . COLON RESECTION  2005  . CORONARY ANGIOPLASTY WITH STENT PLACEMENT  01/2003  . HEMICOLECTOMY    . HIP ARTHROPLASTY Left 04/05/2014   Procedure: ARTHROPLASTY BIPOLAR HIP;  Surgeon: Shelda Pal, MD;  Location: [facility] ORS;  Service: Orthopedics;  Laterality: Left;  . left shoulder replacement  1991   Dr. [name]  . ORIF HIP FRACTURE Left 09/30/2016   Procedure: OPEN REDUCTION INTERNAL FIXATION PERIPROSTHETIC HIP FRACTURE;  Surgeon: Ollen Gross, MD;  Location: [facility] ORS;  Service: Orthopedics;  Laterality: Left;  . right shoulder repair  1985   Dr. [name]  . rotator cuff debridement  1999   Dr. [name]  Allergies  Allergen Reactions  . Arthrotec [Diclofenac-Misoprostol]   . Ibuprofen   . Lactose Intolerance (Gi)   . Milk-Related Compounds Nausea And Vomiting    Cheese  . Other     Grass and ragweed   . Oxycontin [Oxycodone Hcl]   . Pollen Extract     Outpatient Encounter Medications as of 07/08/2017  Medication Sig  . acetaminophen (TYLENOL) 325 MG tablet Take 650 mg by mouth every 6 (six) hours as needed for mild pain.  Marland Kitchen albuterol (PROVENTIL HFA;VENTOLIN HFA) 108 (90 Base) MCG/ACT inhaler Inhale 2 puffs into the lungs every 6 (six)  hours as needed for wheezing or shortness of breath.  . allopurinol (ZYLOPRIM) 100 MG tablet Take 100 mg by mouth daily.  Marland Kitchen aspirin EC 81 MG tablet Take 81 mg by mouth daily.  . Carboxymethylcellul-Glycerin (OPTIVE) 0.5-0.9 % SOLN Apply 2 drops to eye 2 (two) times daily.  . carvedilol (COREG) 3.125 MG tablet Take 1.5625 mg by mouth 2 (two) times daily with a meal.   . cyanocobalamin 1000 MCG tablet Take 1,000 mcg by mouth daily.   Marland Kitchen docusate (COLACE) 50 MG/5ML liquid Take 100 mg by mouth daily.  Marland Kitchen doxycycline (DORYX) 100 MG EC tablet Take 100 mg by mouth 2 (two) times daily.  . furosemide (LASIX) 20 MG tablet Take 20 mg by mouth every other day. Alternate between 40mg  every other day  . furosemide (LASIX) 40 MG tablet Take 40 mg by mouth every other day. Alternate with 20mg  lasix  . ipratropium-albuterol (DUONEB) 0.5-2.5 (3) MG/3ML SOLN Take 3 mLs by nebulization every 6 (six) hours as needed (wheezing).   Marland Kitchen losartan (COZAAR) 25 MG tablet Take 0.5 tablets (12.5 mg total) by mouth at bedtime.  Marland Kitchen morphine 20 MG/5ML solution Take 5 mg by mouth every 4 (four) hours as needed for pain.  . nitroGLYCERIN (NITROSTAT) 0.4 MG SL tablet Place 0.4 mg under the tongue every 5 (five) minutes as needed for chest pain.  Marland Kitchen omeprazole (PRILOSEC OTC) 20 MG tablet Take 20 mg by mouth 2 (two) times daily.  . polyethylene glycol (MIRALAX / GLYCOLAX) packet Take 17 g by mouth daily. Hold for loose stools  . potassium chloride SA (K-DUR,KLOR-CON) 20 MEQ tablet Take 20 mEq by mouth daily.  . sertraline (ZOLOFT) 25 MG tablet Take 25 mg by mouth daily.  . Zinc Oxide (DESITIN) 13 % CREA Apply topically as needed (after each bathroom use).  Marland Kitchen lactose free nutrition (BOOST PLUS) LIQD Take 237 mLs by mouth 2 (two) times daily between meals.   Marland Kitchen LORazepam (ATIVAN) 2 MG/ML concentrated solution Take 0.5 mg by mouth every 6 (six) hours as needed for anxiety.   No facility-administered encounter medications on file as of  07/08/2017.     Review of Systems  Constitutional: Negative for activity change, appetite change, fever and unexpected weight change.  Respiratory: Negative.   Cardiovascular: Negative.   Musculoskeletal: Positive for joint swelling.  Skin: Positive for color change.    Immunization History  Administered Date(s) Administered  . Influenza Inj Mdck Quad Pf 01/05/2016  . Influenza-Unspecified 12/22/2014, 01/10/2017  . Pneumococcal Conjugate-13 12/14/2013  . Pneumococcal Polysaccharide-23 03/29/2016  . Tdap 03/29/2016  . Zoster 11/03/2007  . Zoster Recombinat (Shingrix) 04/02/2017   Pertinent  Health Maintenance Due  Topic Date Due  . PNA vac Low Risk Adult  Completed  . DEXA SCAN  Discontinued   No flowsheet data found. Functional Status Survey:    Vitals:   07/08/17  1419  BP: 129/60  Pulse: 73  Resp: 16  Temp: 98.2 F (36.8 C)  SpO2: 95%   There is no height or weight on file to calculate BMI. Physical Exam  Constitutional: Vital signs are normal. She appears well-developed and well-nourished.  Musculoskeletal: She exhibits edema and tenderness.       Feet:  Neurological: She is alert. She is disoriented.  Baseline cognitive impairment.   Skin: Skin is warm and intact. There is erythema.     Nursing note and vitals reviewed.   Labs reviewed: Recent Labs    02/08/17 1045  02/09/17 0349 02/10/17 0318 02/11/17 0426 02/14/17 0800  NA  --    < > 138 140 140 140  K  --    < > 4.2 3.9 4.2 4.9  CL  --    < > 106 105 103  --   CO2  --    < > 27 27 30   --   GLUCOSE  --    < > 107* 103* 93  --   BUN  --    < > 20 23* 22* 26*  CREATININE  --    < > 0.94 1.15* 1.14* 1.0  CALCIUM  --    < > 7.7* 8.2* 8.1*  --   MG 2.0  --   --   --   --   --    < > = values in this interval not displayed.   Recent Labs    09/30/16 0538  01/29/17 0900 02/08/17 1938 02/09/17 0349  AST 28   < > 15 20 15   ALT 8*   < > 7 13* 11*  ALKPHOS 57   < > 113 78 64  BILITOT 1.4*  --    --  1.1 1.1  PROT 6.5  --   --  6.7 6.0*  ALBUMIN 3.6  --   --  3.2* 2.7*   < > = values in this interval not displayed.   Recent Labs    02/08/17 1045  02/09/17 1622 02/10/17 0318 02/11/17 0426  WBC 14.3*  --  8.9 8.4 8.0  NEUTROABS 11.2*  --   --  6.2 5.3  HGB 11.1*   < > 9.6* 9.3* 9.3*  HCT 35.9*   < > 31.5* 28.0* 29.0*  MCV 100.8*  --  103.6* 105.7* 104.3*  PLT 202  --  181 182 207   < > = values in this interval not displayed.   Lab Results  Component Value Date   TSH 4.58 10/10/2016   No results found for: HGBA1C Lab Results  Component Value Date   CHOL 159 02/09/2017   HDL 49 02/09/2017   LDLCALC 96 02/09/2017   TRIG 69 02/09/2017   CHOLHDL 3.2 02/09/2017    Significant Diagnostic Results in last 30 days:  No results found.  Assessment/Plan  1. Inflammation of toe ? If this is a gouty flare vs cellulitis Redness has not spread past line marked over the weekend Continue Doxycycline until 7 day treatment complete Colchicine 1.2 mg x 1 dose, 0.6 mg x 1 dose   FU with podiatrist on Thursday, 4/25  2) Chronic gout Continue current dose of allopurinol Uric Acid Level 07/08/17 AM  Family/ staff Communication: LPN notified   Labs/tests ordered:  Uric Acid Level 07/09/17 AM

## 2017-07-09 DIAGNOSIS — M109 Gout, unspecified: Secondary | ICD-10-CM | POA: Diagnosis not present

## 2017-07-09 DIAGNOSIS — E785 Hyperlipidemia, unspecified: Secondary | ICD-10-CM | POA: Diagnosis not present

## 2017-07-23 ENCOUNTER — Telehealth: Payer: Self-pay | Admitting: Cardiology

## 2017-07-23 ENCOUNTER — Ambulatory Visit (INDEPENDENT_AMBULATORY_CARE_PROVIDER_SITE_OTHER): Payer: Medicare Other | Admitting: *Deleted

## 2017-07-23 DIAGNOSIS — I495 Sick sinus syndrome: Secondary | ICD-10-CM

## 2017-07-23 NOTE — Telephone Encounter (Signed)
Confirmed remote transmission w/ pt daughter.   

## 2017-07-24 ENCOUNTER — Encounter: Payer: Self-pay | Admitting: Cardiology

## 2017-07-24 NOTE — Progress Notes (Signed)
Remote pacemaker transmission.   

## 2017-07-26 ENCOUNTER — Non-Acute Institutional Stay (SKILLED_NURSING_FACILITY): Payer: Medicare Other | Admitting: Adult Health

## 2017-07-26 DIAGNOSIS — I5022 Chronic systolic (congestive) heart failure: Secondary | ICD-10-CM | POA: Diagnosis not present

## 2017-07-26 DIAGNOSIS — I495 Sick sinus syndrome: Secondary | ICD-10-CM

## 2017-07-26 DIAGNOSIS — M1A09X0 Idiopathic chronic gout, multiple sites, without tophus (tophi): Secondary | ICD-10-CM | POA: Diagnosis not present

## 2017-07-26 DIAGNOSIS — R1310 Dysphagia, unspecified: Secondary | ICD-10-CM

## 2017-07-26 DIAGNOSIS — R0902 Hypoxemia: Secondary | ICD-10-CM | POA: Diagnosis not present

## 2017-07-26 DIAGNOSIS — F0150 Vascular dementia without behavioral disturbance: Secondary | ICD-10-CM

## 2017-07-26 NOTE — Progress Notes (Signed)
Location:  Medical illustrator of Service:  SNF (31) Provider:   [name], ANP [facility] [phone]   [name], DO  Patient Care Team: [name], DO as PCP - General (Geriatric Medicine) [name], NP as Nurse Practitioner (Nurse Practitioner)  Extended Emergency Contact Information Primary Emergency Contact: [name] Address: [address]          [place], [place] [address] [name] Home Phone: [phone] Relation: Other Secondary Emergency Contact: [name] [place] Home Phone: [phone] Relation: Daughter  Code Status:  DNR Goals of care: Advanced Directive information Advanced Directives 06/11/2017  Does Patient Have a Medical Advance Directive? Yes  Type of Advance Directive Out of facility DNR (pink MOST or yellow form);Living will;Healthcare Power of Attorney  Does patient want to make changes to medical advance directive? No - Patient declined  Copy of Healthcare Power of Attorney in Chart? Yes  Pre-existing out of facility DNR order (yellow form or pink MOST form) Pink MOST form placed in chart (order not valid for inpatient use)     Chief Complaint  Patient presents with  . Medical Management of Chronic Issues    HPI:  Pt is a 84 y.o. female seen today for medical management of chronic diseases.  She resides in skilled care and has a caretaker with her at the bedside. Ms. Holben does not have any complaints. Her caretaker denies any issues such as pain or sob. She reports that she wears her oxygen as needed. Sats 99% on RA 07/26/2017   CHF FE 20%: weight stable, no sob or doe Wt Readings from Last 3 Encounters:  07/26/17 165 lb 1.6 oz (74.9 kg)  06/11/17 165 lb (74.8 kg)  05/16/17 163 lb 14.4 oz (74.3 kg)   Has pacemaker remove transmission May of 2019 Gout: currently on Allopurinol. Treated for an exacerbation with colchicine in April  which resolved.  Uric acid 4.9 07/08/17  Dementia: remains pleasant without behavioral issues. Needs assistance with dressing, bathing, and ambulation. Able to communicate and f/c.    Dysphagia: has a hx of coughing with meals, ST suspected aspiration. Her family requested a regular diet for QOL issues. No recent bout of pna or fever.   Past Medical History:  Diagnosis Date  . CAD (coronary artery disease)   . Chronic low back pain   . Contusion of foot, right 05/15/12  . Diverticulitis   . Dyslipidemia   . H/O: hysterectomy   . HTN (hypertension)   . Memory disturbance 05/2012   MMSE 26/30, intact Clock test  . Nonischemic cardiomyopathy (HCC)   . Osteoporosis   . Pulmonary embolism (HCC)   . Right knee pain 05/15/12  . Spinal stenosis   . Ulcer disease   . Weight loss    Past Surgical History:  Procedure Laterality Date  . APPENDECTOMY  2010  . BACK SURGERY    . BIV ICD GENERTAOR CHANGE OUT N/A 05/24/2011   Procedure: BIV ICD GENERTAOR CHANGE OUT;  Surgeon: [name], MD;  Location: [facility] CATH LAB;  Service: Cardiovascular;  Laterality: N/A;  . CATARACT EXTRACTION    . COLON RESECTION  2005  . CORONARY ANGIOPLASTY WITH STENT PLACEMENT  01/2003  . HEMICOLECTOMY    . HIP ARTHROPLASTY Left 04/05/2014   Procedure: ARTHROPLASTY BIPOLAR HIP;  Surgeon: Shelda Pal, MD;  Location: [facility] ORS;  Service: Orthopedics;  Laterality: Left;  . left  shoulder replacement  1991   Dr. [name]  . ORIF HIP FRACTURE Left 09/30/2016   Procedure: OPEN REDUCTION INTERNAL FIXATION PERIPROSTHETIC HIP FRACTURE;  Surgeon: Ollen Gross, MD;  Location: [facility] ORS;  Service: Orthopedics;  Laterality: Left;  . right shoulder repair  1985   Dr. [name]  . rotator cuff debridement  1999   Dr. [name]    Allergies  Allergen Reactions  . Arthrotec [Diclofenac-Misoprostol]   . Ibuprofen   . Lactose Intolerance (Gi)   . Milk-Related Compounds Nausea And Vomiting    Cheese  . Other     Grass and ragweed   .  Oxycontin [Oxycodone Hcl]   . Pollen Extract     Outpatient Encounter Medications as of 07/26/2017  Medication Sig  . acetaminophen (TYLENOL) 325 MG tablet Take 650 mg by mouth every 6 (six) hours as needed for mild pain.  Marland Kitchen albuterol (PROVENTIL HFA;VENTOLIN HFA) 108 (90 Base) MCG/ACT inhaler Inhale 2 puffs into the lungs every 6 (six) hours as needed for wheezing or shortness of breath.  . allopurinol (ZYLOPRIM) 100 MG tablet Take 100 mg by mouth daily.  Marland Kitchen aspirin EC 81 MG tablet Take 81 mg by mouth daily.  . Carboxymethylcellul-Glycerin (OPTIVE) 0.5-0.9 % SOLN Apply 2 drops to eye 2 (two) times daily.  . carvedilol (COREG) 3.125 MG tablet Take 1.5625 mg by mouth 2 (two) times daily with a meal.   . cyanocobalamin 1000 MCG tablet Take 1,000 mcg by mouth daily.   Marland Kitchen docusate (COLACE) 50 MG/5ML liquid Take 100 mg by mouth daily.  . furosemide (LASIX) 20 MG tablet Take 20 mg by mouth every other day. Alternate between 40mg  every other day  . furosemide (LASIX) 40 MG tablet Take 40 mg by mouth every other day. Alternate with 20mg  lasix  . ipratropium-albuterol (DUONEB) 0.5-2.5 (3) MG/3ML SOLN Take 3 mLs by nebulization every 6 (six) hours as needed (wheezing).   . lactose free nutrition (BOOST PLUS) LIQD Take 237 mLs by mouth 2 (two) times daily between meals.   Marland Kitchen losartan (COZAAR) 25 MG tablet Take 0.5 tablets (12.5 mg total) by mouth at bedtime.  . nitroGLYCERIN (NITROSTAT) 0.4 MG SL tablet Place 0.4 mg under the tongue every 5 (five) minutes as needed for chest pain.  Marland Kitchen omeprazole (PRILOSEC OTC) 20 MG tablet Take 20 mg by mouth 2 (two) times daily.  . polyethylene glycol (MIRALAX / GLYCOLAX) packet Take 17 g by mouth daily. Hold for loose stools  . potassium chloride SA (K-DUR,KLOR-CON) 20 MEQ tablet Take 20 mEq by mouth daily.  . sertraline (ZOLOFT) 25 MG tablet Take 25 mg by mouth daily.  . Zinc Oxide (DESITIN) 13 % CREA Apply topically as needed (after each bathroom use).  . [DISCONTINUED]  doxycycline (DORYX) 100 MG EC tablet Take 100 mg by mouth 2 (two) times daily.  . [DISCONTINUED] LORazepam (ATIVAN) 2 MG/ML concentrated solution Take 0.5 mg by mouth every 6 (six) hours as needed for anxiety.  . [DISCONTINUED] morphine 20 MG/5ML solution Take 5 mg by mouth every 4 (four) hours as needed for pain.   No facility-administered encounter medications on file as of 07/26/2017.    Social History   Socioeconomic History  . Marital status: Widowed    Spouse name: Not on file  . Number of children: Not on file  . Years of education: Not on file  . Highest education level: Not on file  Occupational History  . Not on file  Social Needs  . Financial  resource strain: Not on file  . Food insecurity:    Worry: Not on file    Inability: Not on file  . Transportation needs:    Medical: Not on file    Non-medical: Not on file  Tobacco Use  . Smoking status: Never Smoker  . Smokeless tobacco: Never Used  Substance and Sexual Activity  . Alcohol use: No  . Drug use: No  . Sexual activity: Not on file  Lifestyle  . Physical activity:    Days per week: Not on file    Minutes per session: Not on file  . Stress: Not on file  Relationships  . Social connections:    Talks on phone: Not on file    Gets together: Not on file    Attends religious service: Not on file    Active member of club or organization: Not on file    Attends meetings of clubs or organizations: Not on file    Relationship status: Not on file  . Intimate partner violence:    Fear of current or ex partner: Not on file    Emotionally abused: Not on file    Physically abused: Not on file    Forced sexual activity: Not on file  Other Topics Concern  . Not on file  Social History Narrative  . Not on file   Review of Systems  Immunization History  Administered Date(s) Administered  . Influenza Inj Mdck Quad Pf 01/05/2016  . Influenza-Unspecified 12/22/2014, 01/10/2017  . Pneumococcal Conjugate-13  12/14/2013  . Pneumococcal Polysaccharide-23 03/29/2016  . Tdap 03/29/2016  . Zoster 11/03/2007  . Zoster Recombinat (Shingrix) 04/02/2017   Pertinent  Health Maintenance Due  Topic Date Due  . PNA vac Low Risk Adult  Completed  . DEXA SCAN  Discontinued   No flowsheet data found. Functional Status Survey:    Vitals:   07/26/17 1252  Weight: 165 lb 1.6 oz (74.9 kg)   Body mass index is 27.47 kg/m. Physical Exam  Constitutional: No distress.  HENT:  Head: Normocephalic and atraumatic.  Neck: No JVD present.  Cardiovascular: Normal rate and regular rhythm.  No murmur heard. Pulmonary/Chest: Effort normal. No respiratory distress. She has wheezes (scattered exp wheeze). She has rales (right base).  Decreased left base  Abdominal: Soft. Bowel sounds are normal.  Neurological: She is alert.  Oriented to self, able to follow commands, pleasant  Skin: Skin is warm and dry. She is not diaphoretic.  Psychiatric: She has a normal mood and affect.  Nursing note and vitals reviewed.   Labs reviewed: Recent Labs    02/08/17 1045  02/09/17 0349 02/10/17 0318 02/11/17 0426 02/14/17 0800  NA  --    < > 138 140 140 140  K  --    < > 4.2 3.9 4.2 4.9  CL  --    < > 106 105 103  --   CO2  --    < > 27 27 30   --   GLUCOSE  --    < > 107* 103* 93  --   BUN  --    < > 20 23* 22* 26*  CREATININE  --    < > 0.94 1.15* 1.14* 1.0  CALCIUM  --    < > 7.7* 8.2* 8.1*  --   MG 2.0  --   --   --   --   --    < > = values in this interval not displayed.  Recent Labs    09/30/16 0538  01/29/17 0900 02/08/17 1938 02/09/17 0349  AST 28   < > 15 20 15   ALT 8*   < > 7 13* 11*  ALKPHOS 57   < > 113 78 64  BILITOT 1.4*  --   --  1.1 1.1  PROT 6.5  --   --  6.7 6.0*  ALBUMIN 3.6  --   --  3.2* 2.7*   < > = values in this interval not displayed.   Recent Labs    02/08/17 1045  02/09/17 1622 02/10/17 0318 02/11/17 0426  WBC 14.3*  --  8.9 8.4 8.0  NEUTROABS 11.2*  --   --  6.2 5.3   HGB 11.1*   < > 9.6* 9.3* 9.3*  HCT 35.9*   < > 31.5* 28.0* 29.0*  MCV 100.8*  --  103.6* 105.7* 104.3*  PLT 202  --  181 182 207   < > = values in this interval not displayed.   Lab Results  Component Value Date   TSH 4.58 10/10/2016   No results found for: HGBA1C Lab Results  Component Value Date   CHOL 159 02/09/2017   HDL 49 02/09/2017   LDLCALC 96 02/09/2017   TRIG 69 02/09/2017   CHOLHDL 3.2 02/09/2017    Significant Diagnostic Results in last 30 days:  No results found.  Assessment/Plan  Chronic systolic heart failure (HCC) Compensated. Continue coreg, lasix, and arb therapy.   Sick sinus syndrome [facility]) S/p pacemaker  Hypoxia Occasionally wears oxygen due to hx of CHF, dysphagia, atx, and  kyphosis.  Dysphagia Continue reg diet. 1:1 supervision with meals, asp prec  Dementia Doing well despite multiple setbacks in the past year. She would not benefit from additional medication.   Gout Continue allopurinol 100 mg qd     Family/ staff Communication: staff/caretaker/resident  Labs/tests ordered:

## 2017-07-29 ENCOUNTER — Encounter: Payer: Self-pay | Admitting: Adult Health

## 2017-07-29 NOTE — Assessment & Plan Note (Signed)
Doing well despite multiple setbacks in the past year. She would not benefit from additional medication.

## 2017-07-29 NOTE — Assessment & Plan Note (Signed)
Occasionally wears oxygen due to hx of CHF, dysphagia, atx, and  kyphosis.

## 2017-07-29 NOTE — Assessment & Plan Note (Signed)
Continue reg diet. 1:1 supervision with meals, asp prec

## 2017-07-29 NOTE — Assessment & Plan Note (Signed)
Continue allopurinol 100 mg qd 

## 2017-07-29 NOTE — Assessment & Plan Note (Signed)
S/p pacemaker

## 2017-07-29 NOTE — Assessment & Plan Note (Signed)
Compensated. Continue coreg, lasix, and arb therapy.

## 2017-08-13 LAB — CUP PACEART REMOTE DEVICE CHECK
Battery Impedance: 816 Ohm
Battery Remaining Longevity: 60 mo
Battery Voltage: 2.77 V
Implantable Lead Implant Date: 20071005
Implantable Lead Location: 753858
Implantable Lead Location: 753859
Implantable Lead Model: 4194
Implantable Lead Model: 5076
Implantable Pulse Generator Implant Date: 20130307
Lead Channel Impedance Value: 856 Ohm
Lead Channel Pacing Threshold Pulse Width: 0.4 ms
Lead Channel Setting Pacing Amplitude: 2 V
Lead Channel Setting Pacing Amplitude: 2.5 V
Lead Channel Setting Pacing Pulse Width: 0.4 ms
Lead Channel Setting Sensing Sensitivity: 5.6 mV
MDC IDC LEAD IMPLANT DT: 20071005
MDC IDC MSMT LEADCHNL RA IMPEDANCE VALUE: 426 Ohm
MDC IDC MSMT LEADCHNL RA PACING THRESHOLD AMPLITUDE: 0.625 V
MDC IDC MSMT LEADCHNL RA PACING THRESHOLD PULSEWIDTH: 0.4 ms
MDC IDC MSMT LEADCHNL RV PACING THRESHOLD AMPLITUDE: 0.75 V
MDC IDC SESS DTM: 20190507233147
MDC IDC STAT BRADY AP VP PERCENT: 30 %
MDC IDC STAT BRADY AP VS PERCENT: 0 %
MDC IDC STAT BRADY AS VP PERCENT: 70 %
MDC IDC STAT BRADY AS VS PERCENT: 0 %

## 2017-08-19 ENCOUNTER — Non-Acute Institutional Stay (SKILLED_NURSING_FACILITY): Payer: Medicare Other | Admitting: Adult Health

## 2017-08-19 ENCOUNTER — Encounter: Payer: Self-pay | Admitting: Adult Health

## 2017-08-19 DIAGNOSIS — D638 Anemia in other chronic diseases classified elsewhere: Secondary | ICD-10-CM

## 2017-08-19 DIAGNOSIS — N183 Chronic kidney disease, stage 3 unspecified: Secondary | ICD-10-CM

## 2017-08-19 DIAGNOSIS — I5022 Chronic systolic (congestive) heart failure: Secondary | ICD-10-CM | POA: Diagnosis not present

## 2017-08-19 DIAGNOSIS — F0150 Vascular dementia without behavioral disturbance: Secondary | ICD-10-CM | POA: Diagnosis not present

## 2017-08-19 DIAGNOSIS — I495 Sick sinus syndrome: Secondary | ICD-10-CM

## 2017-08-19 DIAGNOSIS — I2510 Atherosclerotic heart disease of native coronary artery without angina pectoris: Secondary | ICD-10-CM | POA: Diagnosis not present

## 2017-08-19 NOTE — Assessment & Plan Note (Signed)
Hx of angioplasty with stent. Now on baby aspirin daily. No symptoms. No longer monitoring lipids, due to the resident's age, debility, and goals of care no further testing will be recommended.

## 2017-08-19 NOTE — Assessment & Plan Note (Signed)
Remains pleasant, walks short distances, and able to f/c. Needs assistance with all ADLs.  Due to her skilled care status she would not benefit from memory medication.

## 2017-08-19 NOTE — Assessment & Plan Note (Signed)
Compensated. Continue ARB therapy, Coreg, and alternating dose of lasix.

## 2017-08-19 NOTE — Assessment & Plan Note (Signed)
Has pacemaker with remote transmission

## 2017-08-19 NOTE — Progress Notes (Signed)
Location:  Medical illustrator of Service:  SNF (31) Provider:   [name], ANP [facility] [phone]   [name], DO  Patient Care Team: [name], DO as PCP - General (Geriatric Medicine) [name], NP as Nurse Practitioner (Nurse Practitioner)  Extended Emergency Contact Information Primary Emergency Contact: [name] Address: [address]          [place], [place] [address] [name] Home Phone: [phone] Relation: Other Secondary Emergency Contact: [name] [place] Home Phone: [phone] Relation: Daughter  Code Status:  DNR Goals of care: Advanced Directive information Advanced Directives 06/11/2017  Does Patient Have a Medical Advance Directive? Yes  Type of Advance Directive Out of facility DNR (pink MOST or yellow form);Living will;Healthcare Power of Attorney  Does patient want to make changes to medical advance directive? No - Patient declined  Copy of Healthcare Power of Attorney in Chart? Yes  Pre-existing out of facility DNR order (yellow form or pink MOST form) Pink MOST form placed in chart (order not valid for inpatient use)     Chief Complaint  Patient presents with  . Medical Management of Chronic Issues    HPI:  Pt is a 84 y.o. female seen today for medical management of chronic diseases. She resides in skilled care due to advanced dementia. There are no complaints regarding her care.  CHF: EF 20%: no sob or cp, has a pacemaker  Weight stable Wt Readings from Last 3 Encounters:  08/19/17 167 lb 14.4 oz (76.2 kg)  07/26/17 165 lb 1.6 oz (74.9 kg)  06/11/17 165 lb (74.8 kg)   CKD III:  Lab Results  Component Value Date   BUN 26 (A) 02/14/2017   Lab Results  Component Value Date   CREATININE 1.0 02/14/2017   Anemia: due to chronic disease Lab Results  Component Value Date   HGB 9.3 (L) 02/11/2017     Functional  status: intermittently incontinent, walks very short distances.     Past Medical History:  Diagnosis Date  . CAD (coronary artery disease)   . Chronic low back pain   . Contusion of foot, right 05/15/12  . Diverticulitis   . Dyslipidemia   . H/O: hysterectomy   . HTN (hypertension)   . Memory disturbance 05/2012   MMSE 26/30, intact Clock test  . Nonischemic cardiomyopathy (HCC)   . Osteoporosis   . Pulmonary embolism (HCC)   . Right knee pain 05/15/12  . Spinal stenosis   . Ulcer disease   . Weight loss    Past Surgical History:  Procedure Laterality Date  . APPENDECTOMY  2010  . BACK SURGERY    . BIV ICD GENERTAOR CHANGE OUT N/A 05/24/2011   Procedure: BIV ICD GENERTAOR CHANGE OUT;  Surgeon: [name], MD;  Location: [facility] CATH LAB;  Service: Cardiovascular;  Laterality: N/A;  . CATARACT EXTRACTION    . COLON RESECTION  2005  . CORONARY ANGIOPLASTY WITH STENT PLACEMENT  01/2003  . HEMICOLECTOMY    . HIP ARTHROPLASTY Left 04/05/2014   Procedure: ARTHROPLASTY BIPOLAR HIP;  Surgeon: Shelda Pal, MD;  Location: [facility] ORS;  Service: Orthopedics;  Laterality: Left;  . left shoulder replacement  1991   Dr. [name]  . ORIF HIP FRACTURE Left 09/30/2016   Procedure: OPEN REDUCTION INTERNAL FIXATION PERIPROSTHETIC HIP FRACTURE;  Surgeon: Ollen Gross, MD;  Location: [facility] ORS;  Service: Orthopedics;  Laterality: Left;  .  right shoulder repair  1985   Dr. [name]  . rotator cuff debridement  1999   Dr. [name]    Allergies  Allergen Reactions  . Arthrotec [Diclofenac-Misoprostol]   . Ibuprofen   . Lactose Intolerance (Gi)   . Milk-Related Compounds Nausea And Vomiting    Cheese  . Other     Grass and ragweed   . Oxycontin [Oxycodone Hcl]   . Pollen Extract     Outpatient Encounter Medications as of 08/19/2017  Medication Sig  . acetaminophen (TYLENOL) 325 MG tablet Take 650 mg by mouth every 6 (six) hours as needed for mild pain.  Marland Kitchen albuterol (PROVENTIL HFA;VENTOLIN HFA) 108 (90  Base) MCG/ACT inhaler Inhale 2 puffs into the lungs every 6 (six) hours as needed for wheezing or shortness of breath.  . allopurinol (ZYLOPRIM) 100 MG tablet Take 100 mg by mouth daily.  Marland Kitchen aspirin EC 81 MG tablet Take 81 mg by mouth daily.  . Carboxymethylcellul-Glycerin (OPTIVE) 0.5-0.9 % SOLN Apply 2 drops to eye 2 (two) times daily.  . carvedilol (COREG) 3.125 MG tablet Take 1.5625 mg by mouth 2 (two) times daily with a meal.   . cyanocobalamin 1000 MCG tablet Take 1,000 mcg by mouth daily.   Marland Kitchen docusate (COLACE) 50 MG/5ML liquid Take 100 mg by mouth daily.  . furosemide (LASIX) 20 MG tablet Take 20 mg by mouth every other day. Alternate between 40mg  every other day  . furosemide (LASIX) 40 MG tablet Take 40 mg by mouth every other day. Alternate with 20mg  lasix  . ipratropium-albuterol (DUONEB) 0.5-2.5 (3) MG/3ML SOLN Take 3 mLs by nebulization every 6 (six) hours as needed (wheezing).   . lactose free nutrition (BOOST PLUS) LIQD Take 237 mLs by mouth daily.   Marland Kitchen losartan (COZAAR) 25 MG tablet Take 0.5 tablets (12.5 mg total) by mouth at bedtime.  . nitroGLYCERIN (NITROSTAT) 0.4 MG SL tablet Place 0.4 mg under the tongue every 5 (five) minutes as needed for chest pain.  Marland Kitchen omeprazole (PRILOSEC OTC) 20 MG tablet Take 20 mg by mouth 2 (two) times daily.  . potassium chloride SA (K-DUR,KLOR-CON) 20 MEQ tablet Take 20 mEq by mouth daily.  . sertraline (ZOLOFT) 25 MG tablet Take 25 mg by mouth daily.  . Zinc Oxide (DESITIN) 13 % CREA Apply topically as needed (after each bathroom use).  . polyethylene glycol (MIRALAX / GLYCOLAX) packet Take 17 g by mouth daily. Hold for loose stools   No facility-administered encounter medications on file as of 08/19/2017.     Review of Systems  Immunization History  Administered Date(s) Administered  . Influenza Inj Mdck Quad Pf 01/05/2016  . Influenza-Unspecified 12/22/2014, 01/10/2017  . Pneumococcal Conjugate-13 12/14/2013  . Pneumococcal  Polysaccharide-23 03/29/2016  . Tdap 03/29/2016  . Zoster 11/03/2007  . Zoster Recombinat (Shingrix) 04/02/2017   Pertinent  Health Maintenance Due  Topic Date Due  . PNA vac Low Risk Adult  Completed  . DEXA SCAN  Discontinued   No flowsheet data found. Functional Status Survey:    Vitals:   08/19/17 1440  Weight: 167 lb 14.4 oz (76.2 kg)   Body mass index is 27.94 kg/m. Physical Exam  Labs reviewed: Recent Labs    02/08/17 1045  02/09/17 0349 02/10/17 0318 02/11/17 0426 02/14/17 0800  NA  --    < > 138 140 140 140  K  --    < > 4.2 3.9 4.2 4.9  CL  --    < > 106  105 103  --   CO2  --    < > 27 27 30   --   GLUCOSE  --    < > 107* 103* 93  --   BUN  --    < > 20 23* 22* 26*  CREATININE  --    < > 0.94 1.15* 1.14* 1.0  CALCIUM  --    < > 7.7* 8.2* 8.1*  --   MG 2.0  --   --   --   --   --    < > = values in this interval not displayed.   Recent Labs    09/30/16 0538  01/29/17 0900 02/08/17 1938 02/09/17 0349  AST 28   < > 15 20 15   ALT 8*   < > 7 13* 11*  ALKPHOS 57   < > 113 78 64  BILITOT 1.4*  --   --  1.1 1.1  PROT 6.5  --   --  6.7 6.0*  ALBUMIN 3.6  --   --  3.2* 2.7*   < > = values in this interval not displayed.   Recent Labs    02/08/17 1045  02/09/17 1622 02/10/17 0318 02/11/17 0426  WBC 14.3*  --  8.9 8.4 8.0  NEUTROABS 11.2*  --   --  6.2 5.3  HGB 11.1*   < > 9.6* 9.3* 9.3*  HCT 35.9*   < > 31.5* 28.0* 29.0*  MCV 100.8*  --  103.6* 105.7* 104.3*  PLT 202  --  181 182 207   < > = values in this interval not displayed.   Lab Results  Component Value Date   TSH 4.58 10/10/2016   No results found for: HGBA1C Lab Results  Component Value Date   CHOL 159 02/09/2017   HDL 49 02/09/2017   LDLCALC 96 02/09/2017   TRIG 69 02/09/2017   CHOLHDL 3.2 02/09/2017    Significant Diagnostic Results in last 30 days:  No results found.  Assessment/Plan  CKD (chronic kidney disease) stage 3, GFR 30-59 ml/min Continue to periodically  monitor BMP and avoid nephrotoxic agents   Anemia of chronic disease Check CBC  Dementia Remains pleasant, walks short distances, and able to f/c. Needs assistance with all ADLs.  Due to her skilled care status she would not benefit from memory medication.  Chronic systolic heart failure (HCC) Compensated. Continue ARB therapy, Coreg, and alternating dose of lasix.   Sick sinus syndrome (HCC) Has pacemaker with remote transmission  CAD (coronary artery disease), native coronary artery Hx of angioplasty with stent. Now on baby aspirin daily. No symptoms. No longer monitoring lipids, due to the resident's age, debility, and goals of care no further testing will be recommended.     Family/ staff Communication: staff/resident  Labs/tests ordered:  CBC BMP

## 2017-08-19 NOTE — Assessment & Plan Note (Signed)
Check CBC 

## 2017-08-19 NOTE — Assessment & Plan Note (Signed)
Continue to periodically monitor BMP and avoid nephrotoxic agents  

## 2017-08-22 DIAGNOSIS — D649 Anemia, unspecified: Secondary | ICD-10-CM | POA: Diagnosis not present

## 2017-08-22 LAB — CBC AND DIFFERENTIAL
HCT: 30 — AB (ref 36–46)
Hemoglobin: 12.5 (ref 12.0–16.0)
Platelets: 190 (ref 150–399)
WBC: 5.9

## 2017-10-22 ENCOUNTER — Encounter: Payer: Medicare Other | Admitting: *Deleted

## 2017-10-29 ENCOUNTER — Encounter: Payer: Self-pay | Admitting: Cardiology

## 2017-10-29 NOTE — Progress Notes (Signed)
Letter  

## 2017-11-14 ENCOUNTER — Non-Acute Institutional Stay (SKILLED_NURSING_FACILITY): Payer: Medicare Other | Admitting: Adult Health

## 2017-11-14 DIAGNOSIS — R1310 Dysphagia, unspecified: Secondary | ICD-10-CM | POA: Diagnosis not present

## 2017-11-14 DIAGNOSIS — K449 Diaphragmatic hernia without obstruction or gangrene: Secondary | ICD-10-CM

## 2017-11-14 DIAGNOSIS — F0150 Vascular dementia without behavioral disturbance: Secondary | ICD-10-CM | POA: Diagnosis not present

## 2017-11-14 DIAGNOSIS — I2510 Atherosclerotic heart disease of native coronary artery without angina pectoris: Secondary | ICD-10-CM

## 2017-11-14 DIAGNOSIS — M158 Other polyosteoarthritis: Secondary | ICD-10-CM

## 2017-11-14 DIAGNOSIS — I5022 Chronic systolic (congestive) heart failure: Secondary | ICD-10-CM

## 2017-11-15 ENCOUNTER — Encounter: Payer: Self-pay | Admitting: Adult Health

## 2017-11-15 NOTE — Assessment & Plan Note (Signed)
Compensated. Continue lasix alternating dosage and arb therapy. Monitor weight.

## 2017-11-15 NOTE — Assessment & Plan Note (Signed)
Continue baby aspirin daily. No longer monitoring lipids due to age/goals of care.

## 2017-11-15 NOTE — Assessment & Plan Note (Signed)
Controlled with scheduled tylenol  

## 2017-11-15 NOTE — Assessment & Plan Note (Signed)
Doing well despite many set backs over the past year. Continue supportive care in the skilled setting.

## 2017-11-15 NOTE — Assessment & Plan Note (Signed)
Continue prilosec 20 mg bid.

## 2017-11-15 NOTE — Assessment & Plan Note (Signed)
Continue regular diet per family request. Should have assistance with her meals with supervision and stay upright after eating. Avoid difficult to chew foods, avoid eating too fast.

## 2017-11-15 NOTE — Progress Notes (Signed)
Location:  Medical illustrator of Service:  SNF (31) Provider:   [name], ANP [facility] [phone]  [name], DO  Patient Care Team: [name], DO as PCP - General (Geriatric Medicine) [name], NP as Nurse Practitioner (Nurse Practitioner)  Extended Emergency Contact Information Primary Emergency Contact: [name] Address: [address]          [place], [place] [address] [name] Home Phone: [phone] Relation: Other Secondary Emergency Contact: [name] [place] Home Phone: [phone] Relation: Daughter  Code Status:  DNR Goals of care: Advanced Directive information Advanced Directives 06/11/2017  Does Patient Have a Medical Advance Directive? Yes  Type of Advance Directive Out of facility DNR (pink MOST or yellow form);Living will;Healthcare Power of Attorney  Does patient want to make changes to medical advance directive? No - Patient declined  Copy of Healthcare Power of Attorney in Chart? Yes  Pre-existing out of facility DNR order (yellow form or pink MOST form) Pink MOST form placed in chart (order not valid for inpatient use)     Chief Complaint  Patient presents with  . Medical Management of Chronic Issues    HPI:  Pt is a 84 y.o. female seen today for medical management of chronic diseases.    Vascular dementia: She remains ambulatory and able to feed herself. She requires assistance for dressing and bathing, as well as cuing. MMSE 06/25/17 18/30 with a passed clock. Has personal caretakers each day.  CHF: Weights remain stable, no sob or cp. Occasionally needs oxygen at 2L when in the bed or chair.  Wt Readings from Last 3 Encounters:  11/15/17 167 lb 14.4 oz (76.2 kg)  08/19/17 167 lb 14.4 oz (76.2 kg)  07/26/17 165 lb 1.6 oz (74.9 kg)   Dysphagia: currently on a regular diet due to QOL issues with no reports of coughing   CAD: hx  of stent, takes aspirin daily  Joint pain: denies any pain, takes tylenol for chronic knee and hip pains  Gait abnormality: hx of hip fracture and dementia and there fore uses a walker for short distances and a WC for long distances  Large hiatal hernia note don CXR 02/11/17,denies indigestion or chest pain  Past Medical History:  Diagnosis Date  . CAD (coronary artery disease)   . Chronic low back pain   . Contusion of foot, right 05/15/12  . Diverticulitis   . Dyslipidemia   . H/O: hysterectomy   . HTN (hypertension)   . Memory disturbance 05/2012   MMSE 26/30, intact Clock test  . Nonischemic cardiomyopathy (HCC)   . Osteoporosis   . Pulmonary embolism (HCC)   . Right knee pain 05/15/12  . Spinal stenosis   . Ulcer disease   . Weight loss    Past Surgical History:  Procedure Laterality Date  . APPENDECTOMY  2010  . BACK SURGERY    . BIV ICD GENERTAOR CHANGE OUT N/A 05/24/2011   Procedure: BIV ICD GENERTAOR CHANGE OUT;  Surgeon: [name], MD;  Location: [facility] CATH LAB;  Service: Cardiovascular;  Laterality: N/A;  . CATARACT EXTRACTION    . COLON RESECTION  2005  . CORONARY ANGIOPLASTY WITH STENT PLACEMENT  01/2003  . HEMICOLECTOMY    . HIP ARTHROPLASTY Left 04/05/2014   Procedure: ARTHROPLASTY BIPOLAR HIP;  Surgeon: Shelda Pal, MD;  Location: [facility] ORS;  Service: Orthopedics;  Laterality: Left;  . left shoulder  replacement  1991   Dr. [name]  . ORIF HIP FRACTURE Left 09/30/2016   Procedure: OPEN REDUCTION INTERNAL FIXATION PERIPROSTHETIC HIP FRACTURE;  Surgeon: Ollen Gross, MD;  Location: [facility] ORS;  Service: Orthopedics;  Laterality: Left;  . right shoulder repair  1985   Dr. [name]  . rotator cuff debridement  1999   Dr. [name]    Allergies  Allergen Reactions  . Arthrotec [Diclofenac-Misoprostol]   . Ibuprofen   . Lactose Intolerance (Gi)   . Milk-Related Compounds Nausea And Vomiting    Cheese  . Other     Grass and ragweed   . Oxycontin [Oxycodone Hcl]   .  Pollen Extract     Outpatient Encounter Medications as of 11/14/2017  Medication Sig  . allopurinol (ZYLOPRIM) 100 MG tablet Take 100 mg by mouth daily.  Marland Kitchen aspirin EC 81 MG tablet Take 81 mg by mouth daily.  . Carboxymethylcellul-Glycerin (OPTIVE) 0.5-0.9 % SOLN Apply 2 drops to eye 2 (two) times daily.  . carvedilol (COREG) 3.125 MG tablet Take 1.5625 mg by mouth 2 (two) times daily with a meal.   . cyanocobalamin 1000 MCG tablet Take 1,000 mcg by mouth daily.   Marland Kitchen docusate (COLACE) 50 MG/5ML liquid Take 100 mg by mouth daily.  . furosemide (LASIX) 20 MG tablet Take 20 mg by mouth every other day. Alternate between 40mg  every other day  . furosemide (LASIX) 40 MG tablet Take 40 mg by mouth every other day. Alternate with 20mg  lasix  . ipratropium-albuterol (DUONEB) 0.5-2.5 (3) MG/3ML SOLN Take 3 mLs by nebulization every 6 (six) hours as needed (wheezing).   . lactose free nutrition (BOOST PLUS) LIQD Take 237 mLs by mouth daily.   Marland Kitchen losartan (COZAAR) 25 MG tablet Take 0.5 tablets (12.5 mg total) by mouth at bedtime.  Marland Kitchen omeprazole (PRILOSEC OTC) 20 MG tablet Take 20 mg by mouth 2 (two) times daily.  . potassium chloride SA (K-DUR,KLOR-CON) 20 MEQ tablet Take 20 mEq by mouth daily.  . sertraline (ZOLOFT) 25 MG tablet Take 25 mg by mouth daily.  . Zinc Oxide (DESITIN) 13 % CREA Apply topically as needed (after each bathroom use).  Marland Kitchen acetaminophen (TYLENOL) 325 MG tablet Take 650 mg by mouth every 6 (six) hours as needed for mild pain.  Marland Kitchen albuterol (PROVENTIL HFA;VENTOLIN HFA) 108 (90 Base) MCG/ACT inhaler Inhale 2 puffs into the lungs every 6 (six) hours as needed for wheezing or shortness of breath.  . polyethylene glycol (MIRALAX / GLYCOLAX) packet Take 17 g by mouth daily. Hold for loose stools  . [DISCONTINUED] nitroGLYCERIN (NITROSTAT) 0.4 MG SL tablet Place 0.4 mg under the tongue every 5 (five) minutes as needed for chest pain.   No facility-administered encounter medications on file as  of 11/14/2017.     Review of Systems  Immunization History  Administered Date(s) Administered  . Influenza Inj Mdck Quad Pf 01/05/2016  . Influenza-Unspecified 12/22/2014, 01/10/2017  . Pneumococcal Conjugate-13 12/14/2013  . Pneumococcal Polysaccharide-23 03/29/2016  . Tdap 03/29/2016  . Zoster 11/03/2007  . Zoster Recombinat (Shingrix) 04/02/2017   Pertinent  Health Maintenance Due  Topic Date Due  . PNA vac Low Risk Adult  Completed  . DEXA SCAN  Discontinued   No flowsheet data found. Functional Status Survey:    Vitals:   11/15/17 0855  BP: 106/60  Pulse: 77  Resp: 20  Temp: 97.6 F (36.4 C)  SpO2: 97%  Weight: 167 lb 14.4 oz (76.2 kg)   Body mass index  is 27.94 kg/m. Physical Exam  Labs reviewed: Recent Labs    02/08/17 1045  02/09/17 0349 02/10/17 0318 02/11/17 0426 02/14/17 0800  NA  --    < > 138 140 140 140  K  --    < > 4.2 3.9 4.2 4.9  CL  --    < > 106 105 103  --   CO2  --    < > 27 27 30   --   GLUCOSE  --    < > 107* 103* 93  --   BUN  --    < > 20 23* 22* 26*  CREATININE  --    < > 0.94 1.15* 1.14* 1.0  CALCIUM  --    < > 7.7* 8.2* 8.1*  --   MG 2.0  --   --   --   --   --    < > = values in this interval not displayed.   Recent Labs    01/29/17 0900 02/08/17 1938 02/09/17 0349  AST 15 20 15   ALT 7 13* 11*  ALKPHOS 113 78 64  BILITOT  --  1.1 1.1  PROT  --  6.7 6.0*  ALBUMIN  --  3.2* 2.7*   Recent Labs    02/08/17 1045  02/09/17 1622 02/10/17 0318 02/11/17 0426 08/22/17  WBC 14.3*  --  8.9 8.4 8.0 5.9  NEUTROABS 11.2*  --   --  6.2 5.3  --   HGB 11.1*   < > 9.6* 9.3* 9.3* 12.5  HCT 35.9*   < > 31.5* 28.0* 29.0* 30*  MCV 100.8*  --  103.6* 105.7* 104.3*  --   PLT 202  --  181 182 207 190   < > = values in this interval not displayed.   Lab Results  Component Value Date   TSH 4.58 10/10/2016   No results found for: HGBA1C Lab Results  Component Value Date   CHOL 159 02/09/2017   HDL 49 02/09/2017   LDLCALC 96  02/09/2017   TRIG 69 02/09/2017   CHOLHDL 3.2 02/09/2017    Significant Diagnostic Results in last 30 days:  No results found.  Assessment/Plan  Dementia Doing well despite many set backs over the past year. Continue supportive care in the skilled setting.   Dysphagia Continue regular diet per family request. Should have assistance with her meals with supervision and stay upright after eating. Avoid difficult to chew foods, avoid eating too fast.   Hiatal hernia Continue prilosec 20 mg bid.   CAD (coronary artery disease), native coronary artery Continue baby aspirin daily. No longer monitoring lipids due to age/goals of care.   Chronic systolic heart failure (HCC) Compensated. Continue lasix alternating dosage and arb therapy. Monitor weight.  Osteoarthritis Controlled with scheduled tylenol.    Family/ staff Communication: staff   Labs/tests ordered: NA

## 2017-11-25 LAB — BASIC METABOLIC PANEL
BUN: 24 — AB (ref 4–21)
CREATININE: 0.9 (ref 0.5–1.1)
GLUCOSE: 80
Potassium: 4.5 (ref 3.4–5.3)
Sodium: 144 (ref 137–147)

## 2017-12-13 ENCOUNTER — Non-Acute Institutional Stay (SKILLED_NURSING_FACILITY): Payer: Medicare Other | Admitting: Adult Health

## 2017-12-13 ENCOUNTER — Encounter: Payer: Self-pay | Admitting: Adult Health

## 2017-12-13 DIAGNOSIS — I495 Sick sinus syndrome: Secondary | ICD-10-CM

## 2017-12-13 DIAGNOSIS — R1310 Dysphagia, unspecified: Secondary | ICD-10-CM | POA: Diagnosis not present

## 2017-12-13 DIAGNOSIS — I5022 Chronic systolic (congestive) heart failure: Secondary | ICD-10-CM | POA: Diagnosis not present

## 2017-12-13 DIAGNOSIS — K5901 Slow transit constipation: Secondary | ICD-10-CM

## 2017-12-13 DIAGNOSIS — R42 Dizziness and giddiness: Secondary | ICD-10-CM | POA: Diagnosis not present

## 2017-12-13 DIAGNOSIS — M1A09X0 Idiopathic chronic gout, multiple sites, without tophus (tophi): Secondary | ICD-10-CM

## 2017-12-13 DIAGNOSIS — F0150 Vascular dementia without behavioral disturbance: Secondary | ICD-10-CM

## 2017-12-13 NOTE — Progress Notes (Signed)
Location:  Medical illustrator of Service:  SNF (31) Provider:   [name], ANP [facility] [phone]   [name], DO  Patient Care Team: [name], DO as PCP - General (Geriatric Medicine) [name], NP as Nurse Practitioner (Nurse Practitioner)  Extended Emergency Contact Information Primary Emergency Contact: [name] Address: [address]          [place], [place] [address] [name] Home Phone: [phone] Relation: Other Secondary Emergency Contact: [name] Home Phone: [phone] Relation: Daughter  Code Status:  DNR Goals of care: Advanced Directive information Advanced Directives 06/11/2017  Does Patient Have a Medical Advance Directive? Yes  Type of Advance Directive Out of facility DNR (pink MOST or yellow form);Living will;Healthcare Power of Attorney  Does patient want to make changes to medical advance directive? No - Patient declined  Copy of Healthcare Power of Attorney in Chart? Yes  Pre-existing out of facility DNR order (yellow form or pink MOST form) Pink MOST form placed in chart (order not valid for inpatient use)     Chief Complaint  Patient presents with  . Medical Management of Chronic Issues    HPI:  Pt is a 84 y.o. female seen today for medical management of chronic diseases.    For my visit [name] is sitting up in her recliner and feels well. She reported some dizziness or feeling "drunk" upon sitting up or standing. Her sitter confirms this issue but they are not sure how long it has gone on due to her dementia. She has a hx of dysphagia but has not had any coughing, choking or fever. She is on a regular diet per her family request.    CHF wit hx of non ischemic cardiomyopathy: no edema, sob, weight gain Wt Readings from Last 3 Encounters:  12/13/17 168 lb 8 oz (76.4 kg)  11/15/17 167 lb 14.4 oz (76.2 kg)    08/19/17 167 lb 14.4 oz (76.2 kg)   Gout: currently on allopurinol with no new symptoms  Constipation: regular BMs with miralax and colace  Vascular dementia: MMSE 06/25/17 18/30 No behaviors reported or change in mentation. Past Medical History:  Diagnosis Date  . CAD (coronary artery disease)   . Chronic low back pain   . Contusion of foot, right 05/15/12  . Diverticulitis   . Dyslipidemia   . H/O: hysterectomy   . HTN (hypertension)   . Memory disturbance 05/2012   MMSE 26/30, intact Clock test  . Nonischemic cardiomyopathy (HCC)   . Osteoporosis   . Pulmonary embolism (HCC)   . Right Henry pain 05/15/12  . Spinal stenosis   . Ulcer disease   . Weight loss    Past Surgical History:  Procedure Laterality Date  . APPENDECTOMY  2010  . BACK SURGERY    . BIV ICD GENERTAOR CHANGE OUT N/A 05/24/2011   Procedure: BIV ICD GENERTAOR CHANGE OUT;  Surgeon: [name], MD;  Location: [facility] CATH LAB;  Service: Cardiovascular;  Laterality: N/A;  . CATARACT EXTRACTION    . COLON RESECTION  2005  . CORONARY ANGIOPLASTY WITH STENT PLACEMENT  01/2003  . HEMICOLECTOMY    . HIP ARTHROPLASTY Left 04/05/2014   Procedure: ARTHROPLASTY BIPOLAR HIP;  Surgeon: Shelda Pal, MD;  Location: [facility] ORS;  Service: Orthopedics;  Laterality: Left;  . left shoulder replacement  1991   Dr. Fannie Henry  . ORIF HIP FRACTURE  Left 09/30/2016   Procedure: OPEN REDUCTION INTERNAL FIXATION PERIPROSTHETIC HIP FRACTURE;  Surgeon: Ollen Gross, MD;  Location: [facility] ORS;  Service: Orthopedics;  Laterality: Left;  . right shoulder repair  1985   Dr. Fannie Henry  . rotator cuff debridement  1999   Dr. Despina Henry    Allergies  Allergen Reactions  . Arthrotec [Diclofenac-Misoprostol]   . Ibuprofen   . Lactose Intolerance (Gi)   . Milk-Related Compounds Nausea And Vomiting    Cheese  . Other     Grass and ragweed   . Oxycontin [Oxycodone Hcl]   . Pollen Extract     Outpatient Encounter Medications as of 12/13/2017  Medication Sig   . acetaminophen (TYLENOL) 325 MG tablet Take 650 mg by mouth every 6 (six) hours as needed for mild pain.  Marland Kitchen albuterol (PROVENTIL HFA;VENTOLIN HFA) 108 (90 Base) MCG/ACT inhaler Inhale 2 puffs into the lungs every 6 (six) hours as needed for wheezing or shortness of breath.  . allopurinol (ZYLOPRIM) 100 MG tablet Take 100 mg by mouth daily.  Marland Kitchen aspirin EC 81 MG tablet Take 81 mg by mouth daily.  . Carboxymethylcellul-Glycerin (OPTIVE) 0.5-0.9 % SOLN Apply 2 drops to eye 2 (two) times daily.  . carvedilol (COREG) 3.125 MG tablet Take 1.5625 mg by mouth 2 (two) times daily with a meal.   . cyanocobalamin 1000 MCG tablet Take 1,000 mcg by mouth daily.   Marland Kitchen docusate (COLACE) 50 MG/5ML liquid Take 100 mg by mouth daily.  . furosemide (LASIX) 20 MG tablet Take 20 mg by mouth every other day. Alternate between 40mg  every other day  . furosemide (LASIX) 40 MG tablet Take 40 mg by mouth every other day. Alternate with 20mg  lasix  . ipratropium-albuterol (DUONEB) 0.5-2.5 (3) MG/3ML SOLN Take 3 mLs by nebulization every 6 (six) hours as needed (wheezing).   . lactose free nutrition (BOOST PLUS) LIQD Take 237 mLs by mouth daily.   Marland Kitchen losartan (COZAAR) 25 MG tablet Take 0.5 tablets (12.5 mg total) by mouth at bedtime.  Marland Kitchen omeprazole (PRILOSEC OTC) 20 MG tablet Take 20 mg by mouth 2 (two) times daily.  . polyethylene glycol (MIRALAX / GLYCOLAX) packet Take 17 g by mouth daily. Hold for loose stools  . sertraline (ZOLOFT) 25 MG tablet Take 25 mg by mouth daily.  . Zinc Oxide (DESITIN) 13 % CREA Apply topically as needed (after each bathroom use).  . potassium chloride SA (K-DUR,KLOR-CON) 20 MEQ tablet Take 20 mEq by mouth every other day.    No facility-administered encounter medications on file as of 12/13/2017.     Review of Systems  Constitutional: Negative for activity change, appetite change, chills, diaphoresis, fatigue, fever and unexpected weight change.  HENT: Negative for congestion.   Respiratory:  Negative for cough, shortness of breath and wheezing.   Cardiovascular: Negative for chest pain, palpitations and leg swelling.  Gastrointestinal: Negative for abdominal distention, abdominal pain, constipation and diarrhea.  Genitourinary: Negative for difficulty urinating and dysuria.  Musculoskeletal: Positive for arthralgias and gait problem. Negative for back pain, joint swelling and myalgias.  Neurological: Positive for dizziness. Negative for tremors, seizures, syncope, facial asymmetry, speech difficulty, weakness, light-headedness, numbness and headaches.  Psychiatric/Behavioral: Positive for confusion. Negative for agitation and behavioral problems.    Immunization History  Administered Date(s) Administered  . Influenza Inj Mdck Quad Pf 01/05/2016  . Influenza-Unspecified 12/22/2014, 01/10/2017  . Pneumococcal Conjugate-13 12/14/2013  . Pneumococcal Polysaccharide-23 03/29/2016  . Tdap 03/29/2016  . Zoster 11/03/2007  . Zoster  Recombinat (Shingrix) 04/02/2017   Pertinent  Health Maintenance Due  Topic Date Due  . PNA vac Low Risk Adult  Completed  . DEXA SCAN  Discontinued   No flowsheet data found. Functional Status Survey:    Vitals:   12/13/17 1313  Weight: 168 lb 8 oz (76.4 kg)   Body mass index is 28.04 kg/m. Physical Exam  Constitutional: No distress.  HENT:  Head: Normocephalic and atraumatic.  Neck: No JVD present.  Cardiovascular: Normal rate and regular rhythm.  No murmur heard. Pulmonary/Chest: Effort normal and breath sounds normal. No respiratory distress. She has no wheezes.  Abdominal: Soft. Bowel sounds are normal.  Musculoskeletal: She exhibits no edema, tenderness or deformity.  Neurological: She is alert.  Oriented x 2  Skin: Skin is warm and dry. She is not diaphoretic.  Psychiatric: She has a normal mood and affect.    Labs reviewed: Recent Labs    02/08/17 1045  02/09/17 0349 02/10/17 0318 02/11/17 0426 02/14/17 0800  NA  --     < > 138 140 140 140  K  --    < > 4.2 3.9 4.2 4.9  CL  --    < > 106 105 103  --   CO2  --    < > 27 27 30   --   GLUCOSE  --    < > 107* 103* 93  --   BUN  --    < > 20 23* 22* 26*  CREATININE  --    < > 0.94 1.15* 1.14* 1.0  CALCIUM  --    < > 7.7* 8.2* 8.1*  --   MG 2.0  --   --   --   --   --    < > = values in this interval not displayed.   Recent Labs    01/29/17 0900 02/08/17 1938 02/09/17 0349  AST 15 20 15   ALT 7 13* 11*  ALKPHOS 113 78 64  BILITOT  --  1.1 1.1  PROT  --  6.7 6.0*  ALBUMIN  --  3.2* 2.7*   Recent Labs    02/08/17 1045  02/09/17 1622 02/10/17 0318 02/11/17 0426 08/22/17  WBC 14.3*  --  8.9 8.4 8.0 5.9  NEUTROABS 11.2*  --   --  6.2 5.3  --   HGB 11.1*   < > 9.6* 9.3* 9.3* 12.5  HCT 35.9*   < > 31.5* 28.0* 29.0* 30*  MCV 100.8*  --  103.6* 105.7* 104.3*  --   PLT 202  --  181 182 207 190   < > = values in this interval not displayed.   Lab Results  Component Value Date   TSH 4.58 10/10/2016   No results found for: HGBA1C Lab Results  Component Value Date   CHOL 159 02/09/2017   HDL 49 02/09/2017   LDLCALC 96 02/09/2017   TRIG 69 02/09/2017   CHOLHDL 3.2 02/09/2017    Significant Diagnostic Results in last 30 days:  No results found.  Assessment/Plan  1. Dizziness Occurs when standing Orthostatic bp and pulse Bmp Monday 9/30  2. Chronic systolic heart failure (HCC) Compensated  Continue alternating dose of lasix, coreg, and losartan. ? If these meds contribute to her symptoms and if adjustment will be needed if she is found to be orthostatic  3. Sick sinus syndrome [facility]) S/p pacemaker  4. Dysphagia, unspecified type Tolerating regular diet well with no reports of cough or fever  5. Vascular dementia without behavioral disturbance (HCC) Remains alert and able to f/c and answer q's but needs assistance with ADLs Fairly stable over the past year, continue supportive care  6. Slow transit constipation Continue colace and  miralax  7. Idiopathic chronic gout of multiple sites without tophus No new events Continue allopurinol 100 mg qd    Family/ staff Communication: resident and caretaker  Labs/tests ordered:  BMP

## 2017-12-16 DIAGNOSIS — D649 Anemia, unspecified: Secondary | ICD-10-CM | POA: Diagnosis not present

## 2017-12-16 DIAGNOSIS — I5032 Chronic diastolic (congestive) heart failure: Secondary | ICD-10-CM | POA: Diagnosis not present

## 2018-01-07 ENCOUNTER — Encounter: Payer: Self-pay | Admitting: Internal Medicine

## 2018-01-07 ENCOUNTER — Non-Acute Institutional Stay (SKILLED_NURSING_FACILITY): Payer: Medicare Other | Admitting: Internal Medicine

## 2018-01-07 DIAGNOSIS — N183 Chronic kidney disease, stage 3 unspecified: Secondary | ICD-10-CM

## 2018-01-07 DIAGNOSIS — K5901 Slow transit constipation: Secondary | ICD-10-CM | POA: Diagnosis not present

## 2018-01-07 DIAGNOSIS — I5022 Chronic systolic (congestive) heart failure: Secondary | ICD-10-CM | POA: Diagnosis not present

## 2018-01-07 DIAGNOSIS — F0150 Vascular dementia without behavioral disturbance: Secondary | ICD-10-CM | POA: Diagnosis not present

## 2018-01-07 DIAGNOSIS — I2510 Atherosclerotic heart disease of native coronary artery without angina pectoris: Secondary | ICD-10-CM

## 2018-01-07 DIAGNOSIS — Z950 Presence of cardiac pacemaker: Secondary | ICD-10-CM

## 2018-01-07 NOTE — Progress Notes (Signed)
Patient ID: [name], female   DOB: Dec 15, 1920, 84 y.o.   MRN: [id number]  Location:  [facility] Room Number: 111 Place of Service:  SNF ([id number]) Provider:   [name], DO  Patient Care Team: [name], DO as PCP - General (Geriatric Medicine) [name], NP as Nurse Practitioner (Nurse Practitioner)  Extended Emergency Contact Information Primary Emergency Contact: [name] Address: [address]          [place], [place] [address] [name] Home Phone: [phone] Relation: Other Secondary Emergency Contact: [name] [place] Home Phone: [phone] Relation: Daughter  Code Status:  DNR, MOST Goals of care: Advanced Directive information Advanced Directives 01/07/2018  Does Patient Have a Medical Advance Directive? Yes  Type of Estate agent of [place];Living will;Out of facility DNR (pink MOST or yellow form)  Does patient want to make changes to medical advance directive? No - Patient declined  Copy of Healthcare Power of Attorney in Chart? Yes  Pre-existing out of facility DNR order (yellow form or pink MOST form) Yellow form placed in chart (order not valid for inpatient use);Pink MOST form placed in chart (order not valid for inpatient use)     Chief Complaint  Patient presents with  . Medical Management of Chronic Issues    Routine Visit    HPI:  Pt is a 84 y.o. female seen today for medical management of chronic diseases.  Nursing reports she is doing well.  Pt also says she is doing great and has no new concerns.  Tells me again about how her husband was a doctor and took care of her all those years until she had a fall, broke her knee and moved to SNF.  She sleeps well, has an appetite that she reports is "too good", bowels are moving and she has no pain.  She has caregiver hours.    Pharmacy suggested her zoloft be tapered and pt has  been doing just fine so we did opt to try this going to 12.5mg  from 25mg  daily.     Past Medical History:  Diagnosis Date  . CAD (coronary artery disease)   . Chronic low back pain   . Contusion of foot, right 05/15/12  . Diverticulitis   . Dyslipidemia   . H/O: hysterectomy   . HTN (hypertension)   . Memory disturbance 05/2012   MMSE 26/30, intact Clock test  . Nonischemic cardiomyopathy (HCC)   . Osteoporosis   . Pulmonary embolism (HCC)   . Right knee pain 05/15/12  . Spinal stenosis   . Ulcer disease   . Weight loss    Past Surgical History:  Procedure Laterality Date  . APPENDECTOMY  2010  . BACK SURGERY    . BIV ICD GENERTAOR CHANGE OUT N/A 05/24/2011   Procedure: BIV ICD GENERTAOR CHANGE OUT;  Surgeon: [name], MD;  Location: [facility] CATH LAB;  Service: Cardiovascular;  Laterality: N/A;  . CATARACT EXTRACTION    . COLON RESECTION  2005  . CORONARY ANGIOPLASTY WITH STENT PLACEMENT  01/2003  . HEMICOLECTOMY    . HIP ARTHROPLASTY Left 04/05/2014   Procedure: ARTHROPLASTY BIPOLAR HIP;  Surgeon: Shelda Pal, MD;  Location: [facility] ORS;  Service: Orthopedics;  Laterality: Left;  . left shoulder replacement  1991   Dr. [name]  . ORIF HIP FRACTURE Left 09/30/2016   Procedure: OPEN REDUCTION INTERNAL FIXATION PERIPROSTHETIC HIP FRACTURE;  Surgeon: [name],  [name], MD;  Location: [facility] ORS;  Service: Orthopedics;  Laterality: Left;  . right shoulder repair  1985   Dr. [name]  . rotator cuff debridement  1999   Dr. [name]    Allergies  Allergen Reactions  . Arthrotec [Diclofenac-Misoprostol]   . Ibuprofen   . Lactose Intolerance (Gi)   . Milk-Related Compounds Nausea And Vomiting    Cheese  . Other     Grass and ragweed   . Oxycontin [Oxycodone Hcl]   . Pollen Extract     Outpatient Encounter Medications as of 01/07/2018  Medication Sig  . acetaminophen (TYLENOL) 325 MG tablet Take 650 mg by mouth every 6 (six) hours as needed for mild pain.  Marland Kitchen albuterol (PROVENTIL HFA;VENTOLIN  HFA) 108 (90 Base) MCG/ACT inhaler Inhale 2 puffs into the lungs every 6 (six) hours as needed for wheezing or shortness of breath.  . allopurinol (ZYLOPRIM) 100 MG tablet Take 100 mg by mouth daily.  Marland Kitchen aspirin EC 81 MG tablet Take 81 mg by mouth daily.  . Carboxymethylcellul-Glycerin (OPTIVE) 0.5-0.9 % SOLN Apply 2 drops to eye 2 (two) times daily.  . carvedilol (COREG) 3.125 MG tablet Take 1.5625 mg by mouth 2 (two) times daily with a meal.   . cyanocobalamin 1000 MCG tablet Take 1,000 mcg by mouth daily.   Marland Kitchen docusate (COLACE) 50 MG/5ML liquid Take 100 mg by mouth daily.  . furosemide (LASIX) 20 MG tablet Take 20 mg by mouth every other day. Alternate between 40mg  every other day  . furosemide (LASIX) 40 MG tablet Take 40 mg by mouth every other day. Alternate with 20mg  lasix  . lactose free nutrition (BOOST PLUS) LIQD Take 237 mLs by mouth daily.   Marland Kitchen losartan (COZAAR) 25 MG tablet Take 0.5 tablets (12.5 mg total) by mouth at bedtime.  . nitroGLYCERIN (NITROSTAT) 0.4 MG SL tablet Place 0.4 mg under the tongue every 5 (five) minutes as needed for chest pain.  Marland Kitchen omeprazole (PRILOSEC OTC) 20 MG tablet Take 20 mg by mouth 2 (two) times daily.  . polyethylene glycol (MIRALAX / GLYCOLAX) packet Take 17 g by mouth daily. Hold for loose stools  . potassium chloride (KLOR-CON) 20 MEQ packet Take 20 mEq by mouth every other day.  . potassium chloride SA (K-DUR,KLOR-CON) 20 MEQ tablet Take 20 mEq by mouth every other day.   . sertraline (ZOLOFT) 25 MG tablet Take 12.5 mg by mouth daily.   . Zinc Oxide (DESITIN) 13 % CREA Apply topically as needed (after each bathroom use).  . [DISCONTINUED] ipratropium-albuterol (DUONEB) 0.5-2.5 (3) MG/3ML SOLN Take 3 mLs by nebulization every 6 (six) hours as needed (wheezing).    No facility-administered encounter medications on file as of 01/07/2018.     Review of Systems  Constitutional: Negative for activity change, appetite change, chills, fatigue and fever.    HENT: Negative for congestion and trouble swallowing.   Eyes: Negative for visual disturbance.  Respiratory: Negative for chest tightness and shortness of breath.   Cardiovascular: Negative for leg swelling.       Wearing her compression hose  Gastrointestinal: Negative for abdominal pain, constipation, diarrhea and nausea.  Genitourinary: Negative for dysuria.  Musculoskeletal: Positive for gait problem. Negative for arthralgias and joint swelling.  Neurological: Negative for dizziness.  Psychiatric/Behavioral: Positive for confusion. Negative for agitation and behavioral problems.    Immunization History  Administered Date(s) Administered  . Influenza Inj Mdck Quad Pf 01/05/2016  . Influenza-Unspecified 12/22/2014, 01/10/2017  . Pneumococcal Conjugate-13 12/14/2013  .  Pneumococcal Polysaccharide-23 03/29/2016  . Tdap 03/29/2016  . Zoster 11/03/2007  . Zoster Recombinat (Shingrix) 04/01/2017, 06/27/2017   Pertinent  Health Maintenance Due  Topic Date Due  . PNA vac Low Risk Adult  Completed  . DEXA SCAN  Discontinued   No flowsheet data found. Functional Status Survey:    Vitals:   01/07/18 1447  BP: 110/60  Pulse: 82  Resp: 19  Temp: 98.1 F (36.7 C)  TempSrc: Oral  SpO2: 99%  Weight: 170 lb (77.1 kg)  Height: 5\' 5"  (1.651 m)   Body mass index is 28.29 kg/m. Physical Exam  Constitutional: She appears well-developed and well-nourished. No distress.  Cardiovascular: Normal rate, regular rhythm, normal heart sounds and intact distal pulses.  Pulmonary/Chest: Effort normal and breath sounds normal. No respiratory distress.  Abdominal: Soft. Bowel sounds are normal. She exhibits no distension. There is no tenderness.  Musculoskeletal: Normal range of motion.  Neurological: She is alert.  Poor short term memory--repeats stories over minutes  Skin: Skin is warm and dry. Capillary refill takes less than 2 seconds.  Psychiatric: She has a normal mood and affect.     Labs reviewed: Recent Labs    02/08/17 1045  02/09/17 0349 02/10/17 0318 02/11/17 0426 02/14/17 0800  NA  --    < > 138 140 140 140  K  --    < > 4.2 3.9 4.2 4.9  CL  --    < > 106 105 103  --   CO2  --    < > 27 27 30   --   GLUCOSE  --    < > 107* 103* 93  --   BUN  --    < > 20 23* 22* 26*  CREATININE  --    < > 0.94 1.15* 1.14* 1.0  CALCIUM  --    < > 7.7* 8.2* 8.1*  --   MG 2.0  --   --   --   --   --    < > = values in this interval not displayed.   Recent Labs    01/29/17 0900 02/08/17 1938 02/09/17 0349  AST 15 20 15   ALT 7 13* 11*  ALKPHOS 113 78 64  BILITOT  --  1.1 1.1  PROT  --  6.7 6.0*  ALBUMIN  --  3.2* 2.7*   Recent Labs    02/08/17 1045  02/09/17 1622 02/10/17 0318 02/11/17 0426 08/22/17  WBC 14.3*  --  8.9 8.4 8.0 5.9  NEUTROABS 11.2*  --   --  6.2 5.3  --   HGB 11.1*   < > 9.6* 9.3* 9.3* 12.5  HCT 35.9*   < > 31.5* 28.0* 29.0* 30*  MCV 100.8*  --  103.6* 105.7* 104.3*  --   PLT 202  --  181 182 207 190   < > = values in this interval not displayed.   Lab Results  Component Value Date   TSH 4.58 10/10/2016   No results found for: HGBA1C Lab Results  Component Value Date   CHOL 159 02/09/2017   HDL 49 02/09/2017   LDLCALC 96 02/09/2017   TRIG 69 02/09/2017   CHOLHDL 3.2 02/09/2017    Assessment/Plan 1. Vascular dementia without behavioral disturbance (HCC) -not on meds for this, cont supportive care in SNF  2. Slow transit constipation -controlled, cont colace  3. Chronic systolic heart failure (HCC) -cont losartan, lasix alternating doses, coreg -no signs of acute exacerbation -  no echo in chart so EF unknown  4. CKD (chronic kidney disease) stage 3, GFR 30-59 ml/min (HCC) -Avoid nephrotoxic agents like nsaids, dose adjust renally excreted meds, hydrate.  5. Coronary artery disease involving native coronary artery of native heart without angina pectoris -stable, asymptomatic, cont secondary prevention with bp control and  baby asa, off statin at advanced age with dementia   6. Biventricular cardiac pacemaker in situ -remains in place for sick sinus syndrome--last check was 07/23/17 and stable  Family/ staff Communication: discussed with snf nurse, caregiver also present for visit  Labs/tests ordered:  No new  [name] L. Daimian Sudberry, D.O. Geriatrics [facility] [address], [place] [address] Cell Phone (Mon-Fri 8am-5pm):  [phone] On Call:  [phone] & follow prompts after 5pm & weekends Office Phone:  [phone] Office Fax:  [phone]

## 2018-02-03 ENCOUNTER — Non-Acute Institutional Stay (SKILLED_NURSING_FACILITY): Payer: Medicare Other | Admitting: Adult Health

## 2018-02-03 ENCOUNTER — Encounter: Payer: Self-pay | Admitting: Adult Health

## 2018-02-03 DIAGNOSIS — N183 Chronic kidney disease, stage 3 unspecified: Secondary | ICD-10-CM

## 2018-02-03 DIAGNOSIS — K5901 Slow transit constipation: Secondary | ICD-10-CM

## 2018-02-03 DIAGNOSIS — M1A09X0 Idiopathic chronic gout, multiple sites, without tophus (tophi): Secondary | ICD-10-CM

## 2018-02-03 DIAGNOSIS — I5022 Chronic systolic (congestive) heart failure: Secondary | ICD-10-CM

## 2018-02-03 DIAGNOSIS — F320 Major depressive disorder, single episode, mild: Secondary | ICD-10-CM | POA: Insufficient documentation

## 2018-02-03 DIAGNOSIS — F0150 Vascular dementia without behavioral disturbance: Secondary | ICD-10-CM

## 2018-02-03 DIAGNOSIS — M158 Other polyosteoarthritis: Secondary | ICD-10-CM

## 2018-02-03 NOTE — Progress Notes (Signed)
Location:  Medical illustrator of Service:  SNF (31) Provider:   [name], ANP [facility] [phone]  [name], DO  Patient Care Team: [name], DO as PCP - General (Geriatric Medicine) [name], NP as Nurse Practitioner (Nurse Practitioner)  Extended Emergency Contact Information Primary Emergency Contact: [name] Address: [address], [address]          [place], [place] [address] [name] Home Phone: [phone] Relation: Other Secondary Emergency Contact: [name] Home Phone: [phone] Relation: Daughter  Code Status:  DNR Goals of care: Advanced Directive information Advanced Directives 01/07/2018  Does Patient Have a Medical Advance Directive? Yes  Type of Estate agent of [place];Living will;Out of facility DNR (pink MOST or yellow form)  Does patient want to make changes to medical advance directive? No - Patient declined  Copy of Healthcare Power of Attorney in Chart? Yes  Pre-existing out of facility DNR order (yellow form or pink MOST form) Yellow form placed in chart (order not valid for inpatient use);Pink MOST form placed in chart (order not valid for inpatient use)     Chief Complaint  Patient presents with  . Medical Management of Chronic Issues    HPI:  Pt is a 84 y.o. female seen today for medical management of chronic diseases.    Vascular dementia: Remains pleasant and able to follow commands. Fairly stable.  MMSE 06/25/17 18/30  Systolic CHF: denies any sob or doe. Weight up 3 lbs.  Wt Readings from Last 3 Encounters:  02/03/18 171 lb 12.8 oz (77.9 kg)  01/07/18 170 lb (77.1 kg)  12/13/17 168 lb 8 oz (76.4 kg)   Depression: zoloft was reduced to 12.5 mg with out any issues in Oct 2019  Gout: continues on allopurinol with no new episodes  Had a formed BM today per caregiver.   Functional status:  intermittently continent, walks short distances  Past Medical History:  Diagnosis Date  . CAD (coronary artery disease)   . Chronic low back pain   . Contusion of foot, right 05/15/12  . Diverticulitis   . Dyslipidemia   . H/O: hysterectomy   . HTN (hypertension)   . Memory disturbance 05/2012   MMSE 26/30, intact Clock test  . Nonischemic cardiomyopathy (HCC)   . Osteoporosis   . Pulmonary embolism (HCC)   . Right knee pain 05/15/12  . Spinal stenosis   . Ulcer disease   . Weight loss    Past Surgical History:  Procedure Laterality Date  . APPENDECTOMY  2010  . BACK SURGERY    . BIV ICD GENERTAOR CHANGE OUT N/A 05/24/2011   Procedure: BIV ICD GENERTAOR CHANGE OUT;  Surgeon: [name], MD;  Location: [facility] CATH LAB;  Service: Cardiovascular;  Laterality: N/A;  . CATARACT EXTRACTION    . COLON RESECTION  2005  . CORONARY ANGIOPLASTY WITH STENT PLACEMENT  01/2003  . HEMICOLECTOMY    . HIP ARTHROPLASTY Left 04/05/2014   Procedure: ARTHROPLASTY BIPOLAR HIP;  Surgeon: Shelda Pal, MD;  Location: [facility];  Service: Orthopedics;  Laterality: Left;  . left shoulder replacement  1991   Dr. [name]  . ORIF HIP FRACTURE Left 09/30/2016   Procedure: OPEN REDUCTION INTERNAL FIXATION PERIPROSTHETIC HIP FRACTURE;  Surgeon: Ollen Gross, MD;  Location: [facility];  Service: Orthopedics;  Laterality: Left;  . right shoulder repair  1985   Dr. [name]  .  rotator cuff debridement  1999   Dr. [name]    Allergies  Allergen Reactions  . Arthrotec [Diclofenac-Misoprostol]   . Ibuprofen   . Lactose Intolerance (Gi)   . Milk-Related Compounds Nausea And Vomiting    Cheese  . Other     Grass and ragweed   . Oxycontin [Oxycodone Hcl]   . Pollen Extract     Outpatient Encounter Medications as of 02/03/2018  Medication Sig  . acetaminophen (TYLENOL) 325 MG tablet Take 650 mg by mouth every 6 (six) hours as needed for mild pain.  Marland Kitchen albuterol (PROVENTIL HFA;VENTOLIN HFA) 108 (90 Base) MCG/ACT inhaler  Inhale 2 puffs into the lungs every 6 (six) hours as needed for wheezing or shortness of breath.  . allopurinol (ZYLOPRIM) 100 MG tablet Take 100 mg by mouth daily.  Marland Kitchen aspirin EC 81 MG tablet Take 81 mg by mouth daily.  . Carboxymethylcellul-Glycerin (OPTIVE) 0.5-0.9 % SOLN Apply 2 drops to eye 2 (two) times daily.  . carvedilol (COREG) 3.125 MG tablet Take 1.5625 mg by mouth 2 (two) times daily with a meal.   . cyanocobalamin 1000 MCG tablet Take 1,000 mcg by mouth daily.   Marland Kitchen docusate (COLACE) 50 MG/5ML liquid Take 100 mg by mouth daily.  . furosemide (LASIX) 20 MG tablet Take 20 mg by mouth every other day. Alternate between 40mg  every other day  . furosemide (LASIX) 40 MG tablet Take 40 mg by mouth every other day. Alternate with 20mg  lasix  . lactose free nutrition (BOOST PLUS) LIQD Take 237 mLs by mouth daily.   Marland Kitchen losartan (COZAAR) 25 MG tablet Take 0.5 tablets (12.5 mg total) by mouth at bedtime.  . nitroGLYCERIN (NITROSTAT) 0.4 MG SL tablet Place 0.4 mg under the tongue every 5 (five) minutes as needed for chest pain.  Marland Kitchen omeprazole (PRILOSEC OTC) 20 MG tablet Take 20 mg by mouth 2 (two) times daily.  . polyethylene glycol (MIRALAX / GLYCOLAX) packet Take 17 g by mouth daily. Hold for loose stools  . potassium chloride (KLOR-CON) 20 MEQ packet Take 20 mEq by mouth every other day.  . potassium chloride SA (K-DUR,KLOR-CON) 20 MEQ tablet Take 20 mEq by mouth every other day.   . sertraline (ZOLOFT) 25 MG tablet Take 12.5 mg by mouth daily.   . Zinc Oxide (DESITIN) 13 % CREA Apply topically as needed (after each bathroom use).   No facility-administered encounter medications on file as of 02/03/2018.     Review of Systems  Constitutional: Negative for activity change, appetite change, chills, diaphoresis, fatigue, fever and unexpected weight change.  HENT: Negative for congestion.   Respiratory: Negative for cough, shortness of breath and wheezing.   Cardiovascular: Negative for chest  pain, palpitations and leg swelling.  Gastrointestinal: Negative for abdominal distention, abdominal pain, constipation and diarrhea.  Genitourinary: Negative for difficulty urinating and dysuria.  Musculoskeletal: Positive for gait problem. Negative for arthralgias, back pain, joint swelling and myalgias.  Neurological: Negative for dizziness, tremors, seizures, syncope, facial asymmetry, speech difficulty, weakness, light-headedness, numbness and headaches.  Psychiatric/Behavioral: Positive for confusion. Negative for agitation and behavioral problems.    Immunization History  Administered Date(s) Administered  . Influenza Inj Mdck Quad Pf 01/05/2016  . Influenza,inj,Quad PF,6+ Mos 01/07/2018  . Influenza-Unspecified 12/22/2014, 01/10/2017  . Pneumococcal Conjugate-13 12/14/2013  . Pneumococcal Polysaccharide-23 03/29/2016  . Tdap 03/29/2016  . Zoster 11/03/2007  . Zoster Recombinat (Shingrix) 04/01/2017, 06/27/2017   Pertinent  Health Maintenance Due  Topic Date Due  . PNA  vac Low Risk Adult  Completed  . DEXA SCAN  Discontinued   No flowsheet data found. Functional Status Survey:    Vitals:   02/03/18 1349  Weight: 171 lb 12.8 oz (77.9 kg)   Body mass index is 28.59 kg/m. Physical Exam  Constitutional: No distress.  HENT:  Head: Normocephalic and atraumatic.  Neck: No JVD present.  Cardiovascular: Normal rate and regular rhythm.  No murmur heard. No edema  Pulmonary/Chest: Effort normal and breath sounds normal. No respiratory distress. She has no wheezes.  Abdominal: Soft. Bowel sounds are normal.  Musculoskeletal: She exhibits deformity (kyphosis). She exhibits no edema or tenderness.  Lymphadenopathy:    She has no cervical adenopathy.  Neurological: She is alert.  Oriented to self only. Pleasant and able to f/c  Skin: Skin is warm and dry. She is not diaphoretic.  Psychiatric: She has a normal mood and affect.  Nursing note and vitals reviewed.   Labs  reviewed: Recent Labs    02/08/17 1045  02/09/17 0349 02/10/17 0318 02/11/17 0426 02/14/17 0800 11/25/17  NA  --    < > 138 140 140 140 144  K  --    < > 4.2 3.9 4.2 4.9 4.5  CL  --    < > 106 105 103  --   --   CO2  --    < > 27 27 30   --   --   GLUCOSE  --    < > 107* 103* 93  --   --   BUN  --    < > 20 23* 22* 26* 24*  CREATININE  --    < > 0.94 1.15* 1.14* 1.0 0.9  CALCIUM  --    < > 7.7* 8.2* 8.1*  --   --   MG 2.0  --   --   --   --   --   --    < > = values in this interval not displayed.   Recent Labs    02/08/17 1938 02/09/17 0349  AST 20 15  ALT 13* 11*  ALKPHOS 78 64  BILITOT 1.1 1.1  PROT 6.7 6.0*  ALBUMIN 3.2* 2.7*   Recent Labs    02/08/17 1045  02/09/17 1622 02/10/17 0318 02/11/17 0426 08/22/17  WBC 14.3*  --  8.9 8.4 8.0 5.9  NEUTROABS 11.2*  --   --  6.2 5.3  --   HGB 11.1*   < > 9.6* 9.3* 9.3* 12.5  HCT 35.9*   < > 31.5* 28.0* 29.0* 30*  MCV 100.8*  --  103.6* 105.7* 104.3*  --   PLT 202  --  181 182 207 190   < > = values in this interval not displayed.   Lab Results  Component Value Date   TSH 4.58 10/10/2016   No results found for: HGBA1C Lab Results  Component Value Date   CHOL 159 02/09/2017   HDL 49 02/09/2017   LDLCALC 96 02/09/2017   TRIG 69 02/09/2017   CHOLHDL 3.2 02/09/2017    Significant Diagnostic Results in last 30 days:  No results found.  Assessment/Plan  1. Vascular dementia without behavioral disturbance [facility]) She remains pleasantly confused and is doing well in the skilled care setting with private caregivers. Not currently on memory meds due to lack of benefit.  2. Slow transit constipation Controlled with colace and miralax  3. Idiopathic chronic gout of multiple sites without tophus Continue allopurinol 100 mg qd  4. Chronic systolic heart failure (HCC) Compensated  Continue Coreg, Losartan, and Lasix alternating between 40 and 20 mg qod  5. Other osteoarthritis involving multiple joints Controlled,  continue tylenol prn  6. CKD (chronic kidney disease) stage 3, GFR 30-59 ml/min (HCC) Continue to periodically monitor BMP and avoid nephrotoxic agents  7. Depression Discontinue zoloft 12.5 mg and monitor for mood changes  Family/ staff Communication: staff  Labs/tests ordered:  NA

## 2018-02-27 ENCOUNTER — Encounter: Payer: Self-pay | Admitting: Adult Health

## 2018-02-27 ENCOUNTER — Non-Acute Institutional Stay (SKILLED_NURSING_FACILITY): Payer: Medicare Other | Admitting: Adult Health

## 2018-02-27 DIAGNOSIS — G4483 Primary cough headache: Secondary | ICD-10-CM | POA: Diagnosis not present

## 2018-02-27 DIAGNOSIS — J209 Acute bronchitis, unspecified: Secondary | ICD-10-CM | POA: Diagnosis not present

## 2018-02-27 DIAGNOSIS — R05 Cough: Secondary | ICD-10-CM | POA: Diagnosis not present

## 2018-02-27 DIAGNOSIS — D7589 Other specified diseases of blood and blood-forming organs: Secondary | ICD-10-CM

## 2018-02-27 DIAGNOSIS — D649 Anemia, unspecified: Secondary | ICD-10-CM | POA: Diagnosis not present

## 2018-02-27 LAB — VITAMIN B12: Vitamin B-12: 1902

## 2018-02-27 LAB — BASIC METABOLIC PANEL
BUN: 27 — AB (ref 4–21)
Creatinine: 0.8 (ref 0.5–1.1)
Glucose: 103
Potassium: 4.8 (ref 3.4–5.3)
Sodium: 142 (ref 137–147)

## 2018-02-27 LAB — CBC AND DIFFERENTIAL
HCT: 21 — AB (ref 36–46)
Hemoglobin: 11.3 — AB (ref 12.0–16.0)
Platelets: 199 (ref 150–399)
WBC: 7

## 2018-02-27 NOTE — Progress Notes (Signed)
Location:  Medical illustrator of Service:  SNF (31) Provider:   [name], ANP [facility] [phone]  [name], DO  Patient Care Team: [name], DO as PCP - General (Geriatric Medicine) [name], NP as Nurse Practitioner (Nurse Practitioner)  Extended Emergency Contact Information Primary Emergency Contact: [name] Address: [address]          [place], [place] [address] [name] Home Phone: [phone] Relation: Other Secondary Emergency Contact: [name] [place] Home Phone: [phone] Relation: Daughter  Code Status:  DNR Goals of care: Advanced Directive information Advanced Directives 01/07/2018  Does Patient Have a Medical Advance Directive? Yes  Type of Estate agent of [place];Living will;Out of facility DNR (pink MOST or yellow form)  Does patient want to make changes to medical advance directive? No - Patient declined  Copy of Healthcare Power of Attorney in Chart? Yes  Pre-existing out of facility DNR order (yellow form or pink MOST form) Yellow form placed in chart (order not valid for inpatient use);Pink MOST form placed in chart (order not valid for inpatient use)     Chief Complaint  Patient presents with  . Acute Visit    cough and wheeze    HPI:  Pt is a 84 y.o. female seen today for an acute visit for wheezing and cough present for 1 week, as well as increased confusion. Ms. Flitton resides in skilled care and has advanced dementia and so most of the hx is obtained from the nurse but she is able to answer straight forward questions. She denies any sorethroat, sob, cp, doe, runny nose. The nurse reports wheezing, low grade temp, cough, and increased confusion. This morning she seemed better and was more alert and ate breakfast. Dr Renato Gails was notified of these symptoms and a CXR and labs were ordered.  BP on 12/11 was  in the 80's systolic but now is 107/70. Sats are wnl and no fever or distress. No report of increased weight or edema. Last low grade temp of 99.3 was on 12/8.    CXR was obtained which showed no acute process.   Labs unremarkable other than an MCV of 109 with Hgb 11.7  Past Medical History:  Diagnosis Date  . CAD (coronary artery disease)   . Chronic low back pain   . Contusion of foot, right 05/15/12  . Dementia (HCC) 10/16/2016   06/25/17 MMSE 18/30 passed clock  . Diverticulitis   . Dyslipidemia   . Dysphagia 12/11/2016  . H/O: hysterectomy   . HTN (hypertension)   . Memory disturbance 05/2012   MMSE 26/30, intact Clock test  . Nonischemic cardiomyopathy (HCC)   . Osteoporosis   . Pulmonary embolism (HCC)   . Right knee pain 05/15/12  . Spinal stenosis   . Ulcer disease   . Weight loss    Past Surgical History:  Procedure Laterality Date  . APPENDECTOMY  2010  . BACK SURGERY    . BIV ICD GENERTAOR CHANGE OUT N/A 05/24/2011   Procedure: BIV ICD GENERTAOR CHANGE OUT;  Surgeon: [name], MD;  Location: [facility] CATH LAB;  Service: Cardiovascular;  Laterality: N/A;  . CATARACT EXTRACTION    . COLON RESECTION  2005  . CORONARY ANGIOPLASTY WITH STENT PLACEMENT  01/2003  . HEMICOLECTOMY    . HIP ARTHROPLASTY Left 04/05/2014   Procedure: ARTHROPLASTY BIPOLAR HIP;  Surgeon: Shelda Pal, MD;  Location: [facility] ORS;  Service: Orthopedics;  Laterality: Left;  . left shoulder replacement  1991   Dr. [name]  . ORIF HIP FRACTURE Left 09/30/2016   Procedure: OPEN REDUCTION INTERNAL FIXATION PERIPROSTHETIC HIP FRACTURE;  Surgeon: Ollen Gross, MD;  Location: [facility] ORS;  Service: Orthopedics;  Laterality: Left;  . right shoulder repair  1985   Dr. [name]  . rotator cuff debridement  1999   Dr. [name]    Allergies  Allergen Reactions  . Arthrotec [Diclofenac-Misoprostol]   . Ibuprofen   . Lactose Intolerance (Gi)   . Milk-Related Compounds Nausea And Vomiting    Cheese  . Other     Grass and  ragweed   . Oxycontin [Oxycodone Hcl]   . Pollen Extract     Outpatient Encounter Medications as of 02/27/2018  Medication Sig  . acetaminophen (TYLENOL) 325 MG tablet Take 650 mg by mouth every 6 (six) hours as needed for mild pain.  Marland Kitchen albuterol (PROVENTIL HFA;VENTOLIN HFA) 108 (90 Base) MCG/ACT inhaler Inhale 2 puffs into the lungs every 6 (six) hours as needed for wheezing or shortness of breath.  . allopurinol (ZYLOPRIM) 100 MG tablet Take 100 mg by mouth daily.  Marland Kitchen aspirin EC 81 MG tablet Take 81 mg by mouth daily.  . Carboxymethylcellul-Glycerin (OPTIVE) 0.5-0.9 % SOLN Apply 2 drops to eye 2 (two) times daily.  . carvedilol (COREG) 3.125 MG tablet Take 1.5625 mg by mouth 2 (two) times daily with a meal.   . cyanocobalamin 1000 MCG tablet Take 1,000 mcg by mouth daily.   Marland Kitchen docusate (COLACE) 50 MG/5ML liquid Take 100 mg by mouth daily.  . furosemide (LASIX) 20 MG tablet Take 20 mg by mouth every other day. Alternate between 40mg  every other day  . furosemide (LASIX) 40 MG tablet Take 40 mg by mouth every other day. Alternate with 20mg  lasix  . lactose free nutrition (BOOST PLUS) LIQD Take 237 mLs by mouth daily.   Marland Kitchen losartan (COZAAR) 25 MG tablet Take 0.5 tablets (12.5 mg total) by mouth at bedtime.  . nitroGLYCERIN (NITROSTAT) 0.4 MG SL tablet Place 0.4 mg under the tongue every 5 (five) minutes as needed for chest pain.  Marland Kitchen omeprazole (PRILOSEC OTC) 20 MG tablet Take 20 mg by mouth 2 (two) times daily.  . polyethylene glycol (MIRALAX / GLYCOLAX) packet Take 17 g by mouth daily. Hold for loose stools  . potassium chloride (KLOR-CON) 20 MEQ packet Take 20 mEq by mouth every other day.  . potassium chloride SA (K-DUR,KLOR-CON) 20 MEQ tablet Take 20 mEq by mouth every other day.   . Zinc Oxide (DESITIN) 13 % CREA Apply topically as needed (after each bathroom use).  . [DISCONTINUED] sertraline (ZOLOFT) 25 MG tablet Take 12.5 mg by mouth daily.    No facility-administered encounter  medications on file as of 02/27/2018.     Review of Systems  Constitutional: Positive for activity change and fatigue. Negative for appetite change, chills, diaphoresis, fever and unexpected weight change.  HENT: Positive for congestion. Negative for postnasal drip, rhinorrhea, sinus pressure, sneezing, sore throat, trouble swallowing and voice change.   Eyes: Negative for visual disturbance.  Respiratory: Positive for cough and wheezing. Negative for shortness of breath.   Cardiovascular: Negative for chest pain, palpitations and leg swelling.  Gastrointestinal: Negative for abdominal distention, abdominal pain, constipation and diarrhea.  Genitourinary: Negative for difficulty urinating and dysuria.  Musculoskeletal: Positive for gait problem. Negative for arthralgias, back pain, joint swelling and myalgias.  Neurological: Negative  for dizziness, tremors, seizures, syncope, facial asymmetry, speech difficulty, weakness, light-headedness, numbness and headaches.  Psychiatric/Behavioral: Positive for confusion. Negative for agitation and behavioral problems.    Immunization History  Administered Date(s) Administered  . Influenza Inj Mdck Quad Pf 01/05/2016  . Influenza,inj,Quad PF,6+ Mos 01/07/2018  . Influenza-Unspecified 12/22/2014, 01/10/2017  . Pneumococcal Conjugate-13 12/14/2013  . Pneumococcal Polysaccharide-23 03/29/2016  . Tdap 03/29/2016  . Zoster 11/03/2007  . Zoster Recombinat (Shingrix) 04/01/2017, 06/27/2017   Pertinent  Health Maintenance Due  Topic Date Due  . PNA vac Low Risk Adult  Completed  . DEXA SCAN  Discontinued   No flowsheet data found. Functional Status Survey:    Vitals:   02/27/18 0937  BP: 107/70  Temp: (!) 96.5 F (35.8 C)  SpO2: 92%   There is no height or weight on file to calculate BMI. Physical Exam Vitals signs and nursing note reviewed.  Constitutional:      General: She is not in acute distress.    Appearance: She is not  toxic-appearing or diaphoretic.  HENT:     Head: Normocephalic and atraumatic.     Right Ear: Tympanic membrane, ear canal and external ear normal.     Left Ear: Tympanic membrane, ear canal and external ear normal.     Nose: Nose normal.     Mouth/Throat:     Mouth: Mucous membranes are dry.     Pharynx: Oropharynx is clear. No oropharyngeal exudate or posterior oropharyngeal erythema.  Eyes:     General:        Right eye: No discharge.        Left eye: No discharge.     Conjunctiva/sclera: Conjunctivae normal.     Pupils: Pupils are equal, round, and reactive to light.  Neck:     Vascular: No JVD.  Cardiovascular:     Rate and Rhythm: Normal rate and regular rhythm.     Heart sounds: No murmur.  Pulmonary:     Effort: Pulmonary effort is normal. No respiratory distress.     Breath sounds: Wheezing and rales present. No rhonchi.  Abdominal:     General: Abdomen is flat. Bowel sounds are normal. There is no distension.     Palpations: Abdomen is soft.     Tenderness: There is no abdominal tenderness.  Skin:    General: Skin is warm and dry.  Neurological:     General: No focal deficit present.     Mental Status: She is alert. Mental status is at baseline.     Comments: Oriented to self only  Psychiatric:        Mood and Affect: Mood normal.     Labs reviewed: Recent Labs    11/25/17  NA 144  K 4.5  BUN 24*  CREATININE 0.9   No results for input(s): AST, ALT, ALKPHOS, BILITOT, PROT, ALBUMIN in the last 8760 hours. Recent Labs    08/22/17  WBC 5.9  HGB 12.5  HCT 30*  PLT 190   Lab Results  Component Value Date   TSH 4.58 10/10/2016   No results found for: HGBA1C Lab Results  Component Value Date   CHOL 159 02/09/2017   HDL 49 02/09/2017   LDLCALC 96 02/09/2017   TRIG 69 02/09/2017   CHOLHDL 3.2 02/09/2017    Significant Diagnostic Results in last 30 days:  No results found.  Assessment/Plan  1. Acute bronchitis, unspecified organism Present for  1 week, if continues will prescribe doxycycline No fever or  purulent sputum at this point w/normal xray Encourage fluids Duoneb tid x 72 hrs  2. Macrocytosis With mild anemia  B12 and folate ordered    Family/ staff Communication: staff/resident  Labs/tests ordered:  CXR CBC BMP

## 2018-04-10 ENCOUNTER — Encounter: Payer: Self-pay | Admitting: Internal Medicine

## 2018-04-22 ENCOUNTER — Encounter: Payer: Self-pay | Admitting: Internal Medicine

## 2018-04-22 ENCOUNTER — Non-Acute Institutional Stay (SKILLED_NURSING_FACILITY): Payer: Medicare Other | Admitting: Internal Medicine

## 2018-04-22 DIAGNOSIS — I495 Sick sinus syndrome: Secondary | ICD-10-CM

## 2018-04-22 DIAGNOSIS — I2510 Atherosclerotic heart disease of native coronary artery without angina pectoris: Secondary | ICD-10-CM

## 2018-04-22 DIAGNOSIS — K5901 Slow transit constipation: Secondary | ICD-10-CM

## 2018-04-22 DIAGNOSIS — F0150 Vascular dementia without behavioral disturbance: Secondary | ICD-10-CM

## 2018-04-22 DIAGNOSIS — I5022 Chronic systolic (congestive) heart failure: Secondary | ICD-10-CM | POA: Diagnosis not present

## 2018-04-22 DIAGNOSIS — N183 Chronic kidney disease, stage 3 unspecified: Secondary | ICD-10-CM

## 2018-04-22 NOTE — Progress Notes (Signed)
Patient ID: [name] R Manukyan, female   DOB: 09/12/1920, 85 y.o.   MRN: [id number]  Location:  [facility] Room Number: 111 Place of Service:  SNF ([phone]) Provider:  Kermit Baloeed, Treydon Henricks L, DO  Patient Care Team: Kermit Baloeed, Colisha Redler L, DO as PCP - General (Geriatric Medicine) Fletcher AnonWert, Christina, NP as Nurse Practitioner (Nurse Practitioner)  Extended Emergency Contact Information Primary Emergency Contact: Teressa LowerBeavers,Maryjane Address: [address]          [place], [place] [address] [place] Home Phone: [phone] Relation: Other Secondary Emergency Contact: Samuel BoucheLambert,Beverly  [place] Home Phone: [phone] Relation: Daughter  Code Status:  DNR Goals of care: Advanced Directive information Advanced Directives 04/22/2018  Does Patient Have a Medical Advance Directive? Yes  Type of Estate agentAdvance Directive Healthcare Power of [place];Living will;Out of facility DNR (pink MOST or yellow form)  Does patient want to make changes to medical advance directive? No - Patient declined  Copy of Healthcare Power of Attorney in Chart? Yes - validated most recent copy scanned in chart (See row information)  Pre-existing out of facility DNR order (yellow form or pink MOST form) Yellow form placed in chart (order not valid for inpatient use);Pink MOST form placed in chart (order not valid for inpatient use)     Chief Complaint  Patient presents with  . Medical Management of Chronic Issues    Routine Visit    HPI:  Pt is a 85 y.o. female seen today for medical management of chronic diseases.  She is in snf for long-term care.    She has caregivers and one was present during visit today.  There were concerns about bruises on her left leg which appeared more leathery and some anterior shin ecchymoses present.  No pain there.  Wears compression hose and caregiver looked in bathroom and discovered that the sock type had been used the day before which  previously led to similar ecchymoses so nursing is going to ensure these are removed again from the cycle of compression hose.  She eats one good meal per day and otherwise drinks boost shakes.  Weight is actually up 5 lbs since last weight.  Spirits are good.  Denies depression.  Past Medical History:  Diagnosis Date  . CAD (coronary artery disease)   . Chronic low back pain   . Contusion of foot, right 05/15/12  . Dementia (HCC) 10/16/2016   06/25/17 MMSE 18/30 passed clock  . Diverticulitis   . Dyslipidemia   . Dysphagia 12/11/2016  . H/O: hysterectomy   . HTN (hypertension)   . Memory disturbance 05/2012   MMSE 26/30, intact Clock test  . Nonischemic cardiomyopathy (HCC)   . Osteoporosis   . Pulmonary embolism (HCC)   . Right knee pain 05/15/12  . Spinal stenosis   . Ulcer disease   . Weight loss    Past Surgical History:  Procedure Laterality Date  . APPENDECTOMY  2010  . BACK SURGERY    . BIV ICD GENERTAOR CHANGE OUT N/A 05/24/2011   Procedure: BIV ICD GENERTAOR CHANGE OUT;  Surgeon: Marinus MawGregg W Taylor, MD;  [facility];  Service: Cardiovascular;  Laterality: N/A;  . CATARACT EXTRACTION    . COLON RESECTION  2005  . CORONARY ANGIOPLASTY WITH STENT PLACEMENT  01/2003  . HEMICOLECTOMY    . HIP ARTHROPLASTY Left 04/05/2014   Procedure: ARTHROPLASTY BIPOLAR HIP;  Surgeon: Shelda PalMatthew D Olin, MD;  Location: [facility] ORS;  Service: Orthopedics;  Laterality: Left;  . left shoulder replacement  1991   Dr. [name]  . ORIF HIP FRACTURE Left 09/30/2016   Procedure: OPEN REDUCTION INTERNAL FIXATION PERIPROSTHETIC HIP FRACTURE;  Surgeon: Ollen Gross, MD;  Location: [facility] ORS;  Service: Orthopedics;  Laterality: Left;  . right shoulder repair  1985   Dr. [name]  . rotator cuff debridement  1999   Dr. [name]    Allergies  Allergen Reactions  . Arthrotec [Diclofenac-Misoprostol]   . Ibuprofen   . Lactose Intolerance (Gi)   . Milk-Related Compounds Nausea And Vomiting    Cheese  . Other      Grass and ragweed   . Oxycontin [Oxycodone Hcl]   . Pollen Extract     Outpatient Encounter Medications as of 04/22/2018  Medication Sig  . acetaminophen (TYLENOL) 325 MG tablet Take 650 mg by mouth every 6 (six) hours as needed for mild pain.  Marland Kitchen albuterol (PROVENTIL HFA;VENTOLIN HFA) 108 (90 Base) MCG/ACT inhaler Inhale 2 puffs into the lungs every 6 (six) hours as needed for wheezing or shortness of breath.  . allopurinol (ZYLOPRIM) 100 MG tablet Take 100 mg by mouth daily.  Marland Kitchen aspirin EC 81 MG tablet Take 81 mg by mouth daily.  . Carboxymethylcellul-Glycerin (OPTIVE) 0.5-0.9 % SOLN Apply 2 drops to eye 2 (two) times daily.  . carvedilol (COREG) 3.125 MG tablet Take 1.5625 mg by mouth 2 (two) times daily with a meal.   . cyanocobalamin 1000 MCG tablet Take 1,000 mcg by mouth daily.   Marland Kitchen docusate (COLACE) 50 MG/5ML liquid Take 100 mg by mouth daily.  . furosemide (LASIX) 20 MG tablet Take 20 mg by mouth every other day. Alternate between 40mg  every other day  . furosemide (LASIX) 40 MG tablet Take 40 mg by mouth every other day. Alternate with 20mg  lasix  . ipratropium-albuterol (DUONEB) 0.5-2.5 (3) MG/3ML SOLN Take 3 mLs by nebulization every 6 (six) hours as needed.  . lactose free nutrition (BOOST PLUS) LIQD Take 237 mLs by mouth daily.   Marland Kitchen losartan (COZAAR) 25 MG tablet Take 0.5 tablets (12.5 mg total) by mouth at bedtime.  Marland Kitchen omeprazole (PRILOSEC OTC) 20 MG tablet Take 20 mg by mouth 2 (two) times daily.  . polyethylene glycol (MIRALAX / GLYCOLAX) packet Take 17 g by mouth daily. Hold for loose stools  . potassium chloride (KLOR-CON) 20 MEQ packet Take 20 mEq by mouth every other day.  . Zinc Oxide (DESITIN) 13 % CREA Apply topically as needed (after each bathroom use).  . [DISCONTINUED] nitroGLYCERIN (NITROSTAT) 0.4 MG SL tablet Place 0.4 mg under the tongue every 5 (five) minutes as needed for chest pain.  . [DISCONTINUED] potassium chloride SA (K-DUR,KLOR-CON) 20 MEQ tablet Take 20 mEq  by mouth every other day.    No facility-administered encounter medications on file as of 04/22/2018.     Review of Systems  Constitutional: Positive for fatigue. Negative for activity change, appetite change, chills, fever and unexpected weight change.  HENT: Positive for hearing loss. Negative for congestion.   Eyes: Negative for visual disturbance.  Respiratory: Negative for cough, shortness of breath and wheezing.   Cardiovascular: Positive for leg swelling. Negative for chest pain and palpitations.       No increase in edema  Gastrointestinal: Negative for abdominal pain, constipation, diarrhea and nausea.  Genitourinary:       Incontinence  Musculoskeletal: Positive for arthralgias and gait problem. Negative for joint swelling and myalgias.  Skin: Negative for color change.  Neurological:  Positive for weakness. Negative for dizziness.  Hematological: Bruises/bleeds easily.  Psychiatric/Behavioral: Positive for confusion. Negative for agitation, behavioral problems and sleep disturbance.    Immunization History  Administered Date(s) Administered  . Influenza Inj Mdck Quad Pf 01/05/2016  . Influenza,inj,Quad PF,6+ Mos 01/07/2018  . Influenza-Unspecified 12/22/2014, 01/10/2017  . Pneumococcal Conjugate-13 12/14/2013  . Pneumococcal Polysaccharide-23 03/29/2016  . Tdap 03/29/2016  . Zoster 11/03/2007  . Zoster Recombinat (Shingrix) 04/01/2017, 06/27/2017   Pertinent  Health Maintenance Due  Topic Date Due  . PNA vac Low Risk Adult  Completed  . DEXA SCAN  Discontinued   Fall Risk  04/22/2018  Falls in the past year? 0  Number falls in past yr: 0  Injury with Fall? 0   Functional Status Survey:    Vitals:   04/22/18 1141  BP: 120/73  Pulse: 70  Resp: 18  Temp: 98 F (36.7 C)  TempSrc: Oral  SpO2: 94%  Weight: 172 lb (78 kg)  Height: 5\' 5"  (1.651 m)   Body mass index is 28.62 kg/m. Physical Exam Constitutional:      General: She is not in acute distress.     Appearance: Normal appearance. She is not toxic-appearing.     Comments: Overweight bmi  HENT:     Head: Normocephalic and atraumatic.     Right Ear: External ear normal.     Left Ear: External ear normal.     Nose: Nose normal.     Mouth/Throat:     Pharynx: Oropharynx is clear.  Eyes:     Conjunctiva/sclera: Conjunctivae normal.  Neck:     Musculoskeletal: Neck supple.  Cardiovascular:     Rate and Rhythm: Normal rate and regular rhythm.     Pulses: Normal pulses.     Heart sounds: Normal heart sounds.  Pulmonary:     Effort: Pulmonary effort is normal.     Breath sounds: Normal breath sounds. No wheezing or rales.  Abdominal:     General: Bowel sounds are normal.     Palpations: Abdomen is soft.  Musculoskeletal:     Comments: Limited ROM shoulders  Skin:    General: Skin is warm and dry.     Capillary Refill: Capillary refill takes less than 2 seconds.     Comments: Ecchymotic area over anterior shin of left lower extremity; also both have some brawny brown changes  Neurological:     General: No focal deficit present.     Mental Status: She is alert.     Comments: Oriented to person, [facility]  Psychiatric:        Mood and Affect: Mood normal.     Comments: Very pleasant, had been napping     Labs reviewed: Recent Labs    11/25/17  NA 144  K 4.5  BUN 24*  CREATININE 0.9   No results for input(s): AST, ALT, ALKPHOS, BILITOT, PROT, ALBUMIN in the last 8760 hours. Recent Labs    08/22/17  WBC 5.9  HGB 12.5  HCT 30*  PLT 190   Lab Results  Component Value Date   TSH 4.58 10/10/2016   No results found for: HGBA1C Lab Results  Component Value Date   CHOL 159 02/09/2017   HDL 49 02/09/2017   LDLCALC 96 02/09/2017   TRIG 69 02/09/2017   CHOLHDL 3.2 02/09/2017    Assessment/Plan 1. Vascular dementia without behavioral disturbance (HCC) -doing fine with caregiver support and snf level of care -eats one good meal, but takes boost shakes  as meal  replacements and has actually gained weight (w/o signs of acute chf)  2. Slow transit constipation -cont current bowel regimen with colace liquid, miralax  3. Chronic systolic heart failure (HCC) -stable clinically, cont lasix alternating doses, losartan, potassium, monitor bmp at least q 6 mos with her intake being as it is  4. Coronary artery disease involving native coronary artery of native heart without angina pectoris -no chest pain or shortness of breath concerns, cont same regimen  5. Sick sinus syndrome (HCC) -cont pacemaker, last remote interrogation May but has one 04/29/18 in office with Dr. Ladona Ridgel  6. CKD (chronic kidney disease) stage 3, GFR 30-59 ml/min (HCC) -Avoid nephrotoxic agents like nsaids, dose adjust renally excreted meds, hydrate. -has had associated anemia, but last hgb good in summer  Family/ staff Communication: discussed with SNF nurse  Labs/tests ordered:  No new  Shawntee Mainwaring L. Blessen Kimbrough, D.O. Geriatrics [facility] [address] [place], [place] [address] Cell Phone (Mon-Fri 8am-5pm):  [phone] On Call:  [phone] & follow prompts after 5pm & weekends Office Phone:  [phone] Office Fax:  [phone]

## 2018-04-29 ENCOUNTER — Encounter (INDEPENDENT_AMBULATORY_CARE_PROVIDER_SITE_OTHER): Payer: Self-pay

## 2018-04-29 ENCOUNTER — Ambulatory Visit (INDEPENDENT_AMBULATORY_CARE_PROVIDER_SITE_OTHER): Payer: Medicare Other | Admitting: Internal Medicine

## 2018-04-29 ENCOUNTER — Encounter: Payer: Self-pay | Admitting: Internal Medicine

## 2018-04-29 VITALS — BP 124/72 | HR 84 | Ht 65.0 in | Wt 177.0 lb

## 2018-04-29 DIAGNOSIS — I10 Essential (primary) hypertension: Secondary | ICD-10-CM | POA: Diagnosis not present

## 2018-04-29 DIAGNOSIS — I2510 Atherosclerotic heart disease of native coronary artery without angina pectoris: Secondary | ICD-10-CM

## 2018-04-29 DIAGNOSIS — I495 Sick sinus syndrome: Secondary | ICD-10-CM

## 2018-04-29 DIAGNOSIS — Z950 Presence of cardiac pacemaker: Secondary | ICD-10-CM

## 2018-04-29 NOTE — Progress Notes (Signed)
HPI [name] returns today for followup of her PPM. She is a pleasant 85 yo woman with a h/o CHB, s/p PPM insertion. In the interim, she has done well but she admits to being more sedentary. She denies chest pain or sob or edema or a reduced appetite. Allergies  Allergen Reactions  . Arthrotec [Diclofenac-Misoprostol]   . Ibuprofen   . Lactose Intolerance (Gi)   . Milk-Related Compounds Nausea And Vomiting    Cheese  . Other     Grass and ragweed   . Oxycontin [Oxycodone Hcl]   . Pollen Extract      Current Outpatient Medications  Medication Sig Dispense Refill  . acetaminophen (TYLENOL) 325 MG tablet Take 650 mg by mouth every 6 (six) hours as needed for mild pain.    Marland Kitchen albuterol (PROVENTIL HFA;VENTOLIN HFA) 108 (90 Base) MCG/ACT inhaler Inhale 2 puffs into the lungs every 6 (six) hours as needed for wheezing or shortness of breath.    . allopurinol (ZYLOPRIM) 100 MG tablet Take 100 mg by mouth daily.    Marland Kitchen aspirin EC 81 MG tablet Take 81 mg by mouth daily.    . Carboxymethylcellul-Glycerin (OPTIVE) 0.5-0.9 % SOLN Apply 2 drops to eye 2 (two) times daily.    . carvedilol (COREG) 3.125 MG tablet Take 1.5625 mg by mouth 2 (two) times daily with a meal.     . cyanocobalamin 1000 MCG tablet Take 1,000 mcg by mouth daily.     Marland Kitchen docusate (COLACE) 50 MG/5ML liquid Take 100 mg by mouth daily.    . furosemide (LASIX) 20 MG tablet Take 20 mg by mouth every other day. Alternate between 40mg  every other day  0  . furosemide (LASIX) 40 MG tablet Take 40 mg by mouth every other day. Alternate with 20mg  lasix    . ipratropium-albuterol (DUONEB) 0.5-2.5 (3) MG/3ML SOLN Take 3 mLs by nebulization every 6 (six) hours as needed.    . lactose free nutrition (BOOST PLUS) LIQD Take 237 mLs by mouth daily.     Marland Kitchen losartan (COZAAR) 25 MG tablet Take 0.5 tablets (12.5 mg total) by mouth at bedtime. 15 tablet 0  . omeprazole (PRILOSEC OTC) 20 MG tablet Take 20 mg by mouth 2 (two) times daily.    .  polyethylene glycol (MIRALAX / GLYCOLAX) packet Take 17 g by mouth daily. Hold for loose stools    . potassium chloride (KLOR-CON) 20 MEQ packet Take 20 mEq by mouth every other day.    . Zinc Oxide (DESITIN) 13 % CREA Apply topically as needed (after each bathroom use).     No current facility-administered medications for this visit.      Past Medical History:  Diagnosis Date  . CAD (coronary artery disease)   . Chronic low back pain   . Contusion of foot, right 05/15/12  . Dementia (HCC) 10/16/2016   06/25/17 MMSE 18/30 passed clock  . Diverticulitis   . Dyslipidemia   . Dysphagia 12/11/2016  . H/O: hysterectomy   . HTN (hypertension)   . Memory disturbance 05/2012   MMSE 26/30, intact Clock test  . Nonischemic cardiomyopathy (HCC)   . Osteoporosis   . Pulmonary embolism (HCC)   . Right knee pain 05/15/12  . Spinal stenosis   . Ulcer disease   . Weight loss     ROS:   All systems reviewed and negative except as noted in the HPI.   Past Surgical History:  Procedure Laterality Date  .  APPENDECTOMY  2010  . BACK SURGERY    . BIV ICD GENERTAOR CHANGE OUT N/A 05/24/2011   Procedure: BIV ICD GENERTAOR CHANGE OUT;  Surgeon: [name], MD;  Location: [facility] CATH LAB;  Service: Cardiovascular;  Laterality: N/A;  . CATARACT EXTRACTION    . COLON RESECTION  2005  . CORONARY ANGIOPLASTY WITH STENT PLACEMENT  01/2003  . HEMICOLECTOMY    . HIP ARTHROPLASTY Left 04/05/2014   Procedure: ARTHROPLASTY BIPOLAR HIP;  Surgeon: Shelda Pal, MD;  Location: [facility] ORS;  Service: Orthopedics;  Laterality: Left;  . left shoulder replacement  1991   Dr. [name]  . ORIF HIP FRACTURE Left 09/30/2016   Procedure: OPEN REDUCTION INTERNAL FIXATION PERIPROSTHETIC HIP FRACTURE;  Surgeon: Ollen Gross, MD;  Location: [facility] ORS;  Service: Orthopedics;  Laterality: Left;  . right shoulder repair  1985   Dr. [name]  . rotator cuff debridement  1999   Dr. [name]     Family History  Problem Relation Age of Onset    . Coronary artery disease Sister   . Heart attack Father 67  . Heart attack Mother   . Other Mother        abdominal blockage     Social History   Socioeconomic History  . Marital status: Widowed    Spouse name: Not on file  . Number of children: Not on file  . Years of education: Not on file  . Highest education level: Not on file  Occupational History  . Not on file  Social Needs  . Financial resource strain: Not on file  . Food insecurity:    Worry: Not on file    Inability: Not on file  . Transportation needs:    Medical: Not on file    Non-medical: Not on file  Tobacco Use  . Smoking status: Never Smoker  . Smokeless tobacco: Never Used  Substance and Sexual Activity  . Alcohol use: No  . Drug use: No  . Sexual activity: Not on file  Lifestyle  . Physical activity:    Days per week: Not on file    Minutes per session: Not on file  . Stress: Not on file  Relationships  . Social connections:    Talks on phone: Not on file    Gets together: Not on file    Attends religious service: Not on file    Active member of club or organization: Not on file    Attends meetings of clubs or organizations: Not on file    Relationship status: Not on file  . Intimate partner violence:    Fear of current or ex partner: Not on file    Emotionally abused: Not on file    Physically abused: Not on file    Forced sexual activity: Not on file  Other Topics Concern  . Not on file  Social History Narrative  . Not on file     BP 124/72   Pulse 84   Ht 5\' 5"  (1.651 m)   Wt 177 lb (80.3 kg)   BMI 29.45 kg/m   Physical Exam:  Well appearing NAD HEENT: Unremarkable Neck:  No JVD, no thyromegally Lymphatics:  No adenopathy Back:  No CVA tenderness Lungs:  Clear with no wheezes HEART:  Regular rate rhythm, no murmurs, no rubs, no clicks Abd:  soft, positive bowel sounds, no organomegally, no rebound, no guarding Ext:  2 plus pulses, no edema, no cyanosis, no  clubbing Skin:  No rashes  no nodules Neuro:  CN II through XII intact, motor grossly intact  EKG - atrial fib with ventricular pacing  DEVICE  Normal device function.  See PaceArt for details.   Assess/Plan: 1. Atrial fib - her VR is well controlled. She is paced 100%. 2. PPM - her Medtronic PPM is working normally.  3. Diastolic heart failure - her symptoms are class 2. She will continue her current meds.  Leonia Reeves.D.

## 2018-04-29 NOTE — Patient Instructions (Signed)
Medication Instructions:  Your physician recommends that you continue on your current medications as directed. Please refer to the Current Medication list given to you today.  Labwork: None ordered.  Testing/Procedures: None ordered.  Follow-Up: Your physician wants you to follow-up in: one year with Dr. Ladona Ridgel.   You will receive a reminder letter in the mail two months in advance. If you don't receive a letter, please call our office to schedule the follow-up appointment.  Remote monitoring is used to monitor your Pacemaker from home. This monitoring reduces the number of office visits required to check your device to one time per year. It allows [place] to keep an eye on the functioning of your device to ensure it is working properly. You are scheduled for a device check from home on 07/29/2018. You may send your transmission at any time that day. If you have a wireless device, the transmission will be sent automatically. After your physician reviews your transmission, you will receive a postcard with your next transmission date.  Any Other Special Instructions Will Be Listed Below (If Applicable).  If you need a refill on your cardiac medications before your next appointment, please call your pharmacy.

## 2018-05-06 LAB — CUP PACEART INCLINIC DEVICE CHECK
Implantable Lead Implant Date: 20071005
Implantable Lead Location: 753858
Implantable Lead Model: 4194
Implantable Pulse Generator Implant Date: 20130307
MDC IDC LEAD IMPLANT DT: 20071005
MDC IDC LEAD LOCATION: 753859
MDC IDC SESS DTM: 20200218100452

## 2018-05-08 ENCOUNTER — Non-Acute Institutional Stay (SKILLED_NURSING_FACILITY): Payer: Medicare Other | Admitting: Adult Health

## 2018-05-08 DIAGNOSIS — F0150 Vascular dementia without behavioral disturbance: Secondary | ICD-10-CM | POA: Diagnosis not present

## 2018-05-08 DIAGNOSIS — D638 Anemia in other chronic diseases classified elsewhere: Secondary | ICD-10-CM | POA: Diagnosis not present

## 2018-05-08 DIAGNOSIS — N183 Chronic kidney disease, stage 3 unspecified: Secondary | ICD-10-CM

## 2018-05-08 DIAGNOSIS — K5901 Slow transit constipation: Secondary | ICD-10-CM

## 2018-05-08 DIAGNOSIS — I495 Sick sinus syndrome: Secondary | ICD-10-CM

## 2018-05-08 DIAGNOSIS — I5022 Chronic systolic (congestive) heart failure: Secondary | ICD-10-CM

## 2018-05-09 ENCOUNTER — Encounter: Payer: Self-pay | Admitting: Adult Health

## 2018-05-09 NOTE — Progress Notes (Signed)
Location:  Medical illustrator of Service:  SNF (31) Provider:   [name], ANP [facility] [phone]   [name], DO  Patient Care Team: [name], DO as PCP - General (Geriatric Medicine) [name], MD as PCP - Cardiology (Cardiology) [name], NP as Nurse Practitioner (Nurse Practitioner)  Extended Emergency Contact Information Primary Emergency Contact: [name] Address: [address], [address]          [place], [place] [address] [name] Home Phone: [phone] Relation: Other Secondary Emergency Contact: [name] [place] Home Phone: [phone] Relation: Daughter  Code Status:  DNR Goals of care: Advanced Directive information Advanced Directives 04/22/2018  Does Patient Have a Medical Advance Directive? Yes  Type of Estate agent of [place];Living will;Out of facility DNR (pink MOST or yellow form)  Does patient want to make changes to medical advance directive? No - Patient declined  Copy of Healthcare Power of Attorney in Chart? Yes - validated most recent copy scanned in chart (See row information)  Pre-existing out of facility DNR order (yellow form or pink MOST form) Yellow form placed in chart (order not valid for inpatient use);Pink MOST form placed in chart (order not valid for inpatient use)     Chief Complaint  Patient presents with  . Medical Management of Chronic Issues    HPI:  Pt is a 85 y.o. female seen today for medical management of chronic diseases.  She resides in the skilled care unit. She has 24 hr caretakers. No acute complaints reported.   CHF: weight is stable without increase in edema. She has mild SOB with exertion which is chronic. No exertional cp Wt Readings from Last 3 Encounters:  05/09/18 172 lb 4.8 oz (78.2 kg)  04/29/18 177 lb (80.3 kg)  04/22/18 172 lb (78 kg)   Vascular dementia: ambulatory  with a walker short distances. Needs assistance with dressing and bathing. Able to feed herself and communicate needs. No issues with behaviors.   Appetite is good, bowels moving well  Hx of sick sinus syndrome with pacemaker. Seen by Dr Ladona Ridgel this month with no new orders. Overall she is doing well given her advanced dementia and other co morbid conditions.   Past Medical History:  Diagnosis Date  . CAD (coronary artery disease)   . Chronic low back pain   . Contusion of foot, right 05/15/12  . Dementia (HCC) 10/16/2016   06/25/17 MMSE 18/30 passed clock  . Diverticulitis   . Dyslipidemia   . Dysphagia 12/11/2016  . H/O: hysterectomy   . HTN (hypertension)   . Memory disturbance 05/2012   MMSE 26/30, intact Clock test  . Nonischemic cardiomyopathy (HCC)   . Osteoporosis   . Pulmonary embolism (HCC)   . Right knee pain 05/15/12  . Spinal stenosis   . Ulcer disease   . Weight loss    Past Surgical History:  Procedure Laterality Date  . APPENDECTOMY  2010  . BACK SURGERY    . BIV ICD GENERTAOR CHANGE OUT N/A 05/24/2011   Procedure: BIV ICD GENERTAOR CHANGE OUT;  Surgeon: [name], MD;  Location: [facility] CATH LAB;  Service: Cardiovascular;  Laterality: N/A;  . CATARACT EXTRACTION    . COLON RESECTION  2005  . CORONARY ANGIOPLASTY WITH STENT PLACEMENT  01/2003  . HEMICOLECTOMY    . HIP ARTHROPLASTY Left 04/05/2014   Procedure: ARTHROPLASTY BIPOLAR HIP;  Surgeon: Molli Hazard  Rosalia Hammers Olin, MD;  Location: [facility] ORS;  Service: Orthopedics;  Laterality: Left;  . left shoulder replacement  1991   Dr. Fannie KneeSue  . ORIF HIP FRACTURE Left 09/30/2016   Procedure: OPEN REDUCTION INTERNAL FIXATION PERIPROSTHETIC HIP FRACTURE;  Surgeon: Ollen GrossAluisio, Frank, MD;  Location: [facility] ORS;  Service: Orthopedics;  Laterality: Left;  . right shoulder repair  1985   Dr. Fannie KneeSue  . rotator cuff debridement  1999   Dr. Despina HickAlusio    Allergies  Allergen Reactions  . Arthrotec [Diclofenac-Misoprostol]   . Ibuprofen   . Lactose  Intolerance (Gi)   . Milk-Related Compounds Nausea And Vomiting    Cheese  . Other     Grass and ragweed   . Oxycontin [Oxycodone Hcl]   . Pollen Extract     Outpatient Encounter Medications as of 05/08/2018  Medication Sig  . acetaminophen (TYLENOL) 325 MG tablet Take 650 mg by mouth every 6 (six) hours as needed for mild pain.  Marland Kitchen. albuterol (PROVENTIL HFA;VENTOLIN HFA) 108 (90 Base) MCG/ACT inhaler Inhale 2 puffs into the lungs every 6 (six) hours as needed for wheezing or shortness of breath.  . allopurinol (ZYLOPRIM) 100 MG tablet Take 100 mg by mouth daily.  Marland Kitchen. aspirin EC 81 MG tablet Take 81 mg by mouth daily.  . Carboxymethylcellul-Glycerin (OPTIVE) 0.5-0.9 % SOLN Apply 2 drops to eye 2 (two) times daily.  . carvedilol (COREG) 3.125 MG tablet Take 1.5625 mg by mouth 2 (two) times daily with a meal.   . cyanocobalamin 1000 MCG tablet Take 1,000 mcg by mouth daily.   Marland Kitchen. docusate (COLACE) 50 MG/5ML liquid Take 100 mg by mouth daily.  . furosemide (LASIX) 20 MG tablet Take 20 mg by mouth every other day. Alternate between 40mg  every other day  . furosemide (LASIX) 40 MG tablet Take 40 mg by mouth every other day. Alternate with 20mg  lasix  . ipratropium-albuterol (DUONEB) 0.5-2.5 (3) MG/3ML SOLN Take 3 mLs by nebulization every 6 (six) hours as needed.  . lactose free nutrition (BOOST PLUS) LIQD Take 237 mLs by mouth daily.   Marland Kitchen. losartan (COZAAR) 25 MG tablet Take 0.5 tablets (12.5 mg total) by mouth at bedtime.  Marland Kitchen. omeprazole (PRILOSEC OTC) 20 MG tablet Take 20 mg by mouth 2 (two) times daily.  . polyethylene glycol (MIRALAX / GLYCOLAX) packet Take 17 g by mouth daily. Hold for loose stools  . potassium chloride (KLOR-CON) 20 MEQ packet Take 20 mEq by mouth every other day.  . Zinc Oxide (DESITIN) 13 % CREA Apply topically as needed (after each bathroom use).   No facility-administered encounter medications on file as of 05/08/2018.     Review of Systems  Constitutional: Negative for  activity change, appetite change, chills, diaphoresis, fatigue, fever and unexpected weight change.  HENT: Negative for congestion.   Respiratory: Positive for shortness of breath (with exertion). Negative for cough and wheezing.   Cardiovascular: Positive for leg swelling (very mild). Negative for chest pain and palpitations.  Gastrointestinal: Negative for abdominal distention, abdominal pain, constipation and diarrhea.  Genitourinary: Negative for difficulty urinating and dysuria.  Musculoskeletal: Positive for gait problem. Negative for arthralgias, back pain, joint swelling and myalgias.  Neurological: Negative for dizziness, tremors, seizures, syncope, facial asymmetry, speech difficulty, weakness, light-headedness, numbness and headaches.  Psychiatric/Behavioral: Positive for confusion. Negative for agitation, behavioral problems and dysphoric mood.    Immunization History  Administered Date(s) Administered  . Influenza Inj Mdck Quad Pf 01/05/2016  . Influenza,inj,Quad PF,6+ Mos 01/07/2018  .  Influenza-Unspecified 12/22/2014, 01/10/2017  . Pneumococcal Conjugate-13 12/14/2013  . Pneumococcal Polysaccharide-23 03/29/2016  . Tdap 03/29/2016  . Zoster 11/03/2007  . Zoster Recombinat (Shingrix) 04/01/2017, 06/27/2017   Pertinent  Health Maintenance Due  Topic Date Due  . PNA vac Low Risk Adult  Completed  . DEXA SCAN  Discontinued   Fall Risk  04/22/2018  Falls in the past year? 0  Number falls in past yr: 0  Injury with Fall? 0  Risk for fall due to : Impaired mobility;Medication side effect;Mental status change;Impaired balance/gait  Follow up Falls evaluation completed;Education provided  Comment seen by therapy for all of this   Functional Status Survey:    Vitals:   05/09/18 1241  Weight: 172 lb 4.8 oz (78.2 kg)   Body mass index is 28.67 kg/m. Physical Exam Constitutional:      General: She is not in acute distress.    Appearance: She is not diaphoretic.  HENT:       Head: Normocephalic and atraumatic.     Right Ear: External ear normal.     Left Ear: External ear normal.     Mouth/Throat:     Pharynx: No oropharyngeal exudate.  Eyes:     Conjunctiva/sclera: Conjunctivae normal.     Pupils: Pupils are equal, round, and reactive to light.  Neck:     Thyroid: No thyromegaly.     Vascular: No JVD.  Cardiovascular:     Rate and Rhythm: Normal rate.     Heart sounds: Normal heart sounds. No murmur.  Pulmonary:     Effort: Pulmonary effort is normal. No respiratory distress.     Breath sounds: No stridor. Rales present.  Abdominal:     General: Bowel sounds are normal.     Palpations: Abdomen is soft.  Musculoskeletal:        General: No swelling, tenderness, deformity or signs of injury.     Comments: Trace edema to ankles  Lymphadenopathy:     Cervical: No cervical adenopathy.  Skin:    General: Skin is warm and dry.  Neurological:     General: No focal deficit present.     Mental Status: She is alert. Mental status is at baseline.     Cranial Nerves: No cranial nerve deficit.     Comments: Oriented x 2  Psychiatric:        Mood and Affect: Mood normal.     Labs reviewed: Recent Labs    11/25/17 02/27/18 0300  NA 144 142  K 4.5 4.8  BUN 24* 27*  CREATININE 0.9 0.8   No results for input(s): AST, ALT, ALKPHOS, BILITOT, PROT, ALBUMIN in the last 8760 hours. Recent Labs    08/22/17 02/27/18 0300  WBC 5.9 7.0  HGB 12.5 11.3*  HCT 30* 21*  PLT 190 199   Lab Results  Component Value Date   TSH 4.58 10/10/2016   No results found for: HGBA1C Lab Results  Component Value Date   CHOL 159 02/09/2017   HDL 49 02/09/2017   LDLCALC 96 02/09/2017   TRIG 69 02/09/2017   CHOLHDL 3.2 02/09/2017    Significant Diagnostic Results in last 30 days:  No results found.  Assessment/Plan 1. Vascular dementia without behavioral disturbance (HCC) Remains pleasant and able to follow commands Continue supportive care in the skilled  environment  2. CKD (chronic kidney disease) stage 3, GFR 30-59 ml/min (HCC) Continue to periodically monitor BMP and avoid nephrotoxic agents  3. Anemia of chronic disease Mild  Continue to monitor   4. Slow transit constipation Controlled, continue miralax and colace  5. Chronic systolic heart failure (HCC) Appears compensated on exam, continue current dose of lasix and arb therapy  6. Sick sinus syndrome [facility]) S/p pacemaker followed by Dr Ladona Ridgel.     Family/ staff Communication: discussed with resident, nurse, and her bedside caretaker  Labs/tests ordered: NA

## 2018-06-12 ENCOUNTER — Encounter: Payer: Self-pay | Admitting: Adult Health

## 2018-06-12 ENCOUNTER — Non-Acute Institutional Stay (SKILLED_NURSING_FACILITY): Payer: Medicare Other | Admitting: Adult Health

## 2018-06-12 DIAGNOSIS — M1A09X0 Idiopathic chronic gout, multiple sites, without tophus (tophi): Secondary | ICD-10-CM

## 2018-06-12 DIAGNOSIS — D638 Anemia in other chronic diseases classified elsewhere: Secondary | ICD-10-CM

## 2018-06-12 DIAGNOSIS — I5022 Chronic systolic (congestive) heart failure: Secondary | ICD-10-CM | POA: Diagnosis not present

## 2018-06-12 DIAGNOSIS — N183 Chronic kidney disease, stage 3 unspecified: Secondary | ICD-10-CM

## 2018-06-12 DIAGNOSIS — F0150 Vascular dementia without behavioral disturbance: Secondary | ICD-10-CM | POA: Diagnosis not present

## 2018-06-12 DIAGNOSIS — I2510 Atherosclerotic heart disease of native coronary artery without angina pectoris: Secondary | ICD-10-CM

## 2018-06-12 NOTE — Progress Notes (Signed)
Location:  Medical illustrator of Service:  SNF (31) Provider:   [name], ANP [facility] [phone]   [name], DO  Patient Care Team: [name], DO as PCP - General (Geriatric Medicine) [name], MD as PCP - Cardiology (Cardiology) [name], NP as Nurse Practitioner (Nurse Practitioner)  Extended Emergency Contact Information Primary Emergency Contact: [name] Address: [address]          [place], [place] [address] [name] Home Phone: [phone] Relation: Other Secondary Emergency Contact: [name] [place] Home Phone: [phone] Relation: Daughter  Code Status:  DNR Goals of care: Advanced Directive information Advanced Directives 04/22/2018  Does Patient Have a Medical Advance Directive? Yes  Type of Estate agent of [place];Living will;Out of facility DNR (pink MOST or yellow form)  Does patient want to make changes to medical advance directive? No - Patient declined  Copy of Healthcare Power of Attorney in Chart? Yes - validated most recent copy scanned in chart (See row information)  Pre-existing out of facility DNR order (yellow form or pink MOST form) Yellow form placed in chart (order not valid for inpatient use);Pink MOST form placed in chart (order not valid for inpatient use)     Chief Complaint  Patient presents with  . Medical Management of Chronic Issues    HPI:  Pt is a 85 y.o. female seen today for medical management of chronic diseases.  She resides in the skilled care unit. No acute complaints reported.   Gout: no joint pain or swelling   CAD: on aspirin, hx of stent  CHF: weight is stable without increase in edema. She has mild SOB with exertion which is chronic. No exertional cp Wt Readings from Last 3 Encounters:  06/12/18 173 lb 6.4 oz (78.7 kg)  05/09/18 172 lb 4.8 oz (78.2 kg)  04/29/18  177 lb (80.3 kg)   Vascular dementia: ambulatory with a walker short distances. Needs assistance with dressing and bathing. Able to feed herself and communicate needs. No issues with behaviors.   ACD: no weakness or pallor   Past Medical History:  Diagnosis Date  . CAD (coronary artery disease)   . Chronic low back pain   . Contusion of foot, right 05/15/12  . Dementia (HCC) 10/16/2016   06/25/17 MMSE 18/30 passed clock  . Diverticulitis   . Dyslipidemia   . Dysphagia 12/11/2016  . H/O: hysterectomy   . HTN (hypertension)   . Memory disturbance 05/2012   MMSE 26/30, intact Clock test  . Nonischemic cardiomyopathy (HCC)   . Osteoporosis   . Pulmonary embolism (HCC)   . Right knee pain 05/15/12  . Spinal stenosis   . Ulcer disease   . Weight loss    Past Surgical History:  Procedure Laterality Date  . APPENDECTOMY  2010  . BACK SURGERY    . BIV ICD GENERTAOR CHANGE OUT N/A 05/24/2011   Procedure: BIV ICD GENERTAOR CHANGE OUT;  Surgeon: [name], MD;  Location: [facility] CATH LAB;  Service: Cardiovascular;  Laterality: N/A;  . CATARACT EXTRACTION    . COLON RESECTION  2005  . CORONARY ANGIOPLASTY WITH STENT PLACEMENT  01/2003  . HEMICOLECTOMY    . HIP ARTHROPLASTY Left 04/05/2014   Procedure: ARTHROPLASTY BIPOLAR HIP;  Surgeon: Shelda Pal, MD;  Location: [facility] ORS;  Service: Orthopedics;  Laterality: Left;  . left shoulder replacement  1991  Dr. [name]  . ORIF HIP FRACTURE Left 09/30/2016   Procedure: OPEN REDUCTION INTERNAL FIXATION PERIPROSTHETIC HIP FRACTURE;  Surgeon: Ollen Gross, MD;  Location: [facility] ORS;  Service: Orthopedics;  Laterality: Left;  . right shoulder repair  1985   Dr. [name]  . rotator cuff debridement  1999   Dr. [name]    Allergies  Allergen Reactions  . Arthrotec [Diclofenac-Misoprostol]   . Ibuprofen   . Lactose Intolerance (Gi)   . Milk-Related Compounds Nausea And Vomiting    Cheese  . Other     Grass and ragweed   . Oxycontin [Oxycodone Hcl]   .  Pollen Extract     Outpatient Encounter Medications as of 06/12/2018  Medication Sig  . acetaminophen (TYLENOL) 325 MG tablet Take 650 mg by mouth every 6 (six) hours as needed for mild pain.  Marland Kitchen albuterol (PROVENTIL HFA;VENTOLIN HFA) 108 (90 Base) MCG/ACT inhaler Inhale 2 puffs into the lungs every 6 (six) hours as needed for wheezing or shortness of breath.  . allopurinol (ZYLOPRIM) 100 MG tablet Take 100 mg by mouth daily.  Marland Kitchen aspirin EC 81 MG tablet Take 81 mg by mouth daily.  . Carboxymethylcellul-Glycerin (OPTIVE) 0.5-0.9 % SOLN Apply 2 drops to eye 2 (two) times daily.  . carvedilol (COREG) 3.125 MG tablet Take 1.5625 mg by mouth 2 (two) times daily with a meal.   . cyanocobalamin 1000 MCG tablet Take 1,000 mcg by mouth daily.   Marland Kitchen docusate (COLACE) 50 MG/5ML liquid Take 100 mg by mouth daily.  . furosemide (LASIX) 20 MG tablet Take 20 mg by mouth every other day. Alternate between 40mg  every other day  . furosemide (LASIX) 40 MG tablet Take 40 mg by mouth every other day. Alternate with 20mg  lasix  . ipratropium-albuterol (DUONEB) 0.5-2.5 (3) MG/3ML SOLN Take 3 mLs by nebulization every 6 (six) hours as needed.  . lactose free nutrition (BOOST PLUS) LIQD Take 237 mLs by mouth daily.   Marland Kitchen losartan (COZAAR) 25 MG tablet Take 0.5 tablets (12.5 mg total) by mouth at bedtime.  Marland Kitchen omeprazole (PRILOSEC OTC) 20 MG tablet Take 20 mg by mouth 2 (two) times daily.  . polyethylene glycol (MIRALAX / GLYCOLAX) packet Take 17 g by mouth daily. Hold for loose stools  . potassium chloride (KLOR-CON) 20 MEQ packet Take 20 mEq by mouth every other day.  . Zinc Oxide (DESITIN) 13 % CREA Apply topically as needed (after each bathroom use).   No facility-administered encounter medications on file as of 06/12/2018.     Review of Systems  Constitutional: Negative for activity change, appetite change, chills, diaphoresis, fatigue, fever and unexpected weight change.  HENT: Negative for congestion.    Respiratory: Positive for shortness of breath (with exertion). Negative for cough and wheezing.   Cardiovascular: Positive for leg swelling (very mild). Negative for chest pain and palpitations.  Gastrointestinal: Negative for abdominal distention, abdominal pain, constipation and diarrhea.  Genitourinary: Negative for difficulty urinating and dysuria.  Musculoskeletal: Positive for gait problem. Negative for arthralgias, back pain, joint swelling and myalgias.  Neurological: Negative for dizziness, tremors, seizures, syncope, facial asymmetry, speech difficulty, weakness, light-headedness, numbness and headaches.  Psychiatric/Behavioral: Positive for confusion. Negative for agitation, behavioral problems and dysphoric mood.    Immunization History  Administered Date(s) Administered  . Influenza Inj Mdck Quad Pf 01/05/2016  . Influenza,inj,Quad PF,6+ Mos 01/07/2018  . Influenza-Unspecified 12/22/2014, 01/10/2017  . Pneumococcal Conjugate-13 12/14/2013  . Pneumococcal Polysaccharide-23 03/29/2016  . Tdap 03/29/2016  . Zoster 11/03/2007  .  Zoster Recombinat (Shingrix) 04/01/2017, 06/27/2017   Pertinent  Health Maintenance Due  Topic Date Due  . PNA vac Low Risk Adult  Completed  . DEXA SCAN  Discontinued   Fall Risk  04/22/2018  Falls in the past year? 0  Number falls in past yr: 0  Injury with Fall? 0  Risk for fall due to : Impaired mobility;Medication side effect;Mental status change;Impaired balance/gait  Follow up Falls evaluation completed;Education provided  Comment seen by therapy for all of this   Functional Status Survey:    Vitals:   06/12/18 1458  Weight: 173 lb 6.4 oz (78.7 kg)   Body mass index is 28.86 kg/m. Physical Exam Constitutional:      General: She is not in acute distress.    Appearance: She is not diaphoretic.  HENT:     Head: Normocephalic and atraumatic.  Neck:     Thyroid: No thyromegaly.     Vascular: No JVD.  Cardiovascular:     Heart  sounds: No murmur.  Pulmonary:     Effort: Pulmonary effort is normal. No respiratory distress.     Breath sounds: No stridor.  Musculoskeletal:        General: No swelling or deformity.     Comments: Trace edema to ankles  Skin:    General: Skin is warm and dry.  Neurological:     General: No focal deficit present.     Mental Status: She is alert. Mental status is at baseline.     Comments: Oriented x 2  Psychiatric:        Mood and Affect: Mood normal.     Labs reviewed: Recent Labs    11/25/17 02/27/18  NA 144 142  K 4.5 4.8  BUN 24* 27*  CREATININE 0.9 0.8   No results for input(s): AST, ALT, ALKPHOS, BILITOT, PROT, ALBUMIN in the last 8760 hours. Recent Labs    08/22/17 02/27/18  WBC 5.9 7.0  HGB 12.5 11.3*  HCT 30* 21*  PLT 190 199   Lab Results  Component Value Date   TSH 4.58 10/10/2016   No results found for: HGBA1C Lab Results  Component Value Date   CHOL 159 02/09/2017   HDL 49 02/09/2017   LDLCALC 96 02/09/2017   TRIG 69 02/09/2017   CHOLHDL 3.2 02/09/2017    Significant Diagnostic Results in last 30 days:  No results found.  Assessment/Plan  1. Chronic systolic heart failure (HCC) Class 2 symptoms, well controlled with current regimen of lasix and arb therapy  2. Vascular dementia without behavioral disturbance (HCC) Progressive decline in function and cognition but fairly stable over the past year.   3. CKD (chronic kidney disease) stage 3, GFR 30-59 ml/min (HCC) Continue to periodically monitor BMP and avoid nephrotoxic agents  4. Anemia of chronic disease Lab Results  Component Value Date   HGB 11.3 (A) 02/27/2018  Unchanged, continue to monitor annually   5. Coronary artery disease involving native coronary artery of native heart without angina pectoris Continue aspirin 81 mg qd No new symptoms  6. Idiopathic chronic gout of multiple sites without tophus No new exacerbations, continue allopurinol. Check uric acid annually   Family/ staff Communication: nurse  Labs/tests ordered: NA

## 2018-07-15 ENCOUNTER — Non-Acute Institutional Stay (SKILLED_NURSING_FACILITY): Payer: Medicare Other | Admitting: Internal Medicine

## 2018-07-15 ENCOUNTER — Encounter: Payer: Self-pay | Admitting: Internal Medicine

## 2018-07-15 DIAGNOSIS — I2510 Atherosclerotic heart disease of native coronary artery without angina pectoris: Secondary | ICD-10-CM

## 2018-07-15 DIAGNOSIS — F0150 Vascular dementia without behavioral disturbance: Secondary | ICD-10-CM | POA: Diagnosis not present

## 2018-07-15 DIAGNOSIS — D638 Anemia in other chronic diseases classified elsewhere: Secondary | ICD-10-CM

## 2018-07-15 DIAGNOSIS — N183 Chronic kidney disease, stage 3 unspecified: Secondary | ICD-10-CM

## 2018-07-15 DIAGNOSIS — I5022 Chronic systolic (congestive) heart failure: Secondary | ICD-10-CM

## 2018-07-15 DIAGNOSIS — F4321 Adjustment disorder with depressed mood: Secondary | ICD-10-CM

## 2018-07-15 NOTE — Progress Notes (Signed)
Location:  [facility] Room Number: 111 Place of Service:  SNF (31)SNF Provider:  Mita Vallo L. Renato Gails, D.O., C.M.D.  [name], DO  Patient Care Team: [name], DO as PCP - General (Geriatric Medicine) [name], MD as PCP - Cardiology (Cardiology) [name], NP as Nurse Practitioner (Nurse Practitioner)  Extended Emergency Contact Information Primary Emergency Contact: [name] Address: [address], [address]          [place], [place] [address] [name] Home Phone: [phone] Relation: Other Secondary Emergency Contact: [name] [place] Home Phone: [phone] Relation: Daughter  Code Status:  DNR, MOST 12/20/17 Goals of care: Advanced Directive information Advanced Directives 07/15/2018  Does Patient Have a Medical Advance Directive? Yes  Type of Estate agent of [place];Out of facility DNR (pink MOST or yellow form)  Does patient want to make changes to medical advance directive? No - Patient declined  Copy of Healthcare Power of Attorney in Chart? Yes - validated most recent copy scanned in chart (See row information)  Pre-existing out of facility DNR order (yellow form or pink MOST form) Yellow form placed in chart (order not valid for inpatient use);Pink MOST form placed in chart (order not valid for inpatient use)     Chief Complaint  Patient presents with  . Medical Management of Chronic Issues    Routine Visit    HPI:  Pt is a 85 y.o. female seen today for medical management of chronic diseases.  Her caregiver is sitting with her and they are watching soap operas, eating popcorn and resident is asleep.  Adia c/o some nausea around lunchtime which caregiver reports is usual for her.  She eats a great breakfast but little lunch.  Weights reviewed:  Slight downward trend past few months but could be considered stable vs 11/19.  Weight  still in "overweight" range.  She denies pain.  She is sleeping well.  She wears her compression hose in the daytime to control her edema.  She was drowsy when I visited and not as sociable as she sometimes can be.  No concerns were reported by nursing.  4/23, resident had been down and tearful, missing family--facetime was done and she appeared improved after that.  She had a normal large bm this am.   Past Medical History:  Diagnosis Date  . CAD (coronary artery disease)   . Chronic low back pain   . Contusion of foot, right 05/15/12  . Dementia (HCC) 10/16/2016   06/25/17 MMSE 18/30 passed clock  . Diverticulitis   . Dyslipidemia   . Dysphagia 12/11/2016  . H/O: hysterectomy   . HTN (hypertension)   . Memory disturbance 05/2012   MMSE 26/30, intact Clock test  . Nonischemic cardiomyopathy (HCC)   . Osteoporosis   . Pulmonary embolism (HCC)   . Right knee pain 05/15/12  . Spinal stenosis   . Ulcer disease   . Weight loss    Past Surgical History:  Procedure Laterality Date  . APPENDECTOMY  2010  . BACK SURGERY    . BIV ICD GENERTAOR CHANGE OUT N/A 05/24/2011   Procedure: BIV ICD GENERTAOR CHANGE OUT;  Surgeon: [name], MD;  Location: [facility] CATH LAB;  Service: Cardiovascular;  Laterality: N/A;  . CATARACT EXTRACTION    . COLON RESECTION  2005  . CORONARY ANGIOPLASTY WITH STENT PLACEMENT  01/2003  . HEMICOLECTOMY    . HIP ARTHROPLASTY Left 04/05/2014  Procedure: ARTHROPLASTY BIPOLAR HIP;  Surgeon: Shelda Pal, MD;  Location: [facility] ORS;  Service: Orthopedics;  Laterality: Left;  . left shoulder replacement  1991   Dr. [name]  . ORIF HIP FRACTURE Left 09/30/2016   Procedure: OPEN REDUCTION INTERNAL FIXATION PERIPROSTHETIC HIP FRACTURE;  Surgeon: Ollen Gross, MD;  Location: [facility] ORS;  Service: Orthopedics;  Laterality: Left;  . right shoulder repair  1985   Dr. [name]  . rotator cuff debridement  1999   Dr. [name]    Allergies  Allergen Reactions  . Arthrotec  [Diclofenac-Misoprostol]   . Ibuprofen   . Lactose Intolerance (Gi)   . Milk-Related Compounds Nausea And Vomiting    Cheese  . Other     Grass and ragweed   . Oxycontin [Oxycodone Hcl]   . Pollen Extract     Outpatient Encounter Medications as of 07/15/2018  Medication Sig  . acetaminophen (TYLENOL) 325 MG tablet Take 650 mg by mouth every 6 (six) hours as needed for mild pain.  Marland Kitchen albuterol (PROVENTIL HFA;VENTOLIN HFA) 108 (90 Base) MCG/ACT inhaler Inhale 2 puffs into the lungs every 6 (six) hours as needed for wheezing or shortness of breath.  . allopurinol (ZYLOPRIM) 100 MG tablet Take 100 mg by mouth daily.  Marland Kitchen aspirin EC 81 MG tablet Take 81 mg by mouth daily.  . Carboxymethylcellul-Glycerin (OPTIVE) 0.5-0.9 % SOLN Apply 2 drops to eye 2 (two) times daily.  . carvedilol (COREG) 3.125 MG tablet Take 1.5625 mg by mouth 2 (two) times daily with a meal.   . cyanocobalamin 1000 MCG tablet Take 1,000 mcg by mouth daily.   Marland Kitchen docusate (COLACE) 50 MG/5ML liquid Take 100 mg by mouth daily.  . furosemide (LASIX) 20 MG tablet Take 20 mg by mouth every other day. Alternate between 40mg  every other day  . furosemide (LASIX) 40 MG tablet Take 40 mg by mouth every other day. Alternate with 20mg  lasix  . lactose free nutrition (BOOST PLUS) LIQD Take 237 mLs by mouth daily.   Marland Kitchen losartan (COZAAR) 25 MG tablet Take 0.5 tablets (12.5 mg total) by mouth at bedtime.  Marland Kitchen omeprazole (PRILOSEC OTC) 20 MG tablet Take 20 mg by mouth 2 (two) times daily.  . polyethylene glycol (MIRALAX / GLYCOLAX) packet Take 17 g by mouth daily. Hold for loose stools  . potassium chloride (KLOR-CON) 20 MEQ packet Take 20 mEq by mouth every other day.  . Zinc Oxide (DESITIN) 13 % CREA Apply topically as needed (after each bathroom use).  . [DISCONTINUED] ipratropium-albuterol (DUONEB) 0.5-2.5 (3) MG/3ML SOLN Take 3 mLs by nebulization every 6 (six) hours as needed.   No facility-administered encounter medications on file as of  07/15/2018.     Review of Systems  Constitutional: Positive for malaise/fatigue. Negative for chills and fever.  HENT: Positive for hearing loss.   Eyes: Negative for blurred vision.  Respiratory: Negative for cough and shortness of breath.   Cardiovascular: Positive for leg swelling. Negative for chest pain and palpitations.  Gastrointestinal: Positive for nausea. Negative for abdominal pain, blood in stool, constipation, diarrhea, heartburn, melena and vomiting.  Genitourinary: Negative for dysuria.  Musculoskeletal: Negative for falls and joint pain.  Skin: Negative for itching and rash.  Neurological: Negative for loss of consciousness and headaches.  Endo/Heme/Allergies: Bruises/bleeds easily.  Psychiatric/Behavioral: Positive for depression and memory loss. The patient is not nervous/anxious and does not have insomnia.     Immunization History  Administered Date(s) Administered  . Influenza Inj Mdck Quad Pf  01/05/2016  . Influenza,inj,Quad PF,6+ Mos 01/07/2018  . Influenza-Unspecified 12/22/2014, 01/10/2017  . Pneumococcal Conjugate-13 12/14/2013  . Pneumococcal Polysaccharide-23 03/29/2016  . Tdap 03/29/2016  . Zoster 11/03/2007  . Zoster Recombinat (Shingrix) 04/01/2017, 06/27/2017   Pertinent  Health Maintenance Due  Topic Date Due  . PNA vac Low Risk Adult  Completed  . DEXA SCAN  Discontinued   Fall Risk  04/22/2018  Falls in the past year? 0  Number falls in past yr: 0  Injury with Fall? 0  Risk for fall due to : Impaired mobility;Medication side effect;Mental status change;Impaired balance/gait  Follow up Falls evaluation completed;Education provided  Comment seen by therapy for all of this   Functional Status Survey:    Vitals:   07/15/18 1141  BP: 112/75  Pulse: 84  Resp: 19  Temp: (!) 97.3 F (36.3 C)  TempSrc: Oral  SpO2: 96%  Weight: 171 lb (77.6 kg)  Height: 5\' 5"  (1.651 m)   Body mass index is 28.46 kg/m. Physical Exam Vitals signs and  nursing note reviewed.  Constitutional:      General: She is not in acute distress.    Appearance: Normal appearance. She is not ill-appearing or toxic-appearing.  HENT:     Head: Normocephalic and atraumatic.  Cardiovascular:     Pulses: Normal pulses.     Heart sounds: Normal heart sounds.  Pulmonary:     Effort: Pulmonary effort is normal.     Breath sounds: Normal breath sounds.  Abdominal:     General: Bowel sounds are normal. There is no distension.     Palpations: Abdomen is soft. There is no mass.     Tenderness: There is no abdominal tenderness. There is no guarding or rebound.  Skin:    General: Skin is warm and dry.  Neurological:     General: No focal deficit present.     Cranial Nerves: No cranial nerve deficit.     Comments: Oriented to person, place  Psychiatric:     Comments: Not as bubbly as usual today     Labs reviewed: Recent Labs    11/25/17 02/27/18  NA 144 142  K 4.5 4.8  BUN 24* 27*  CREATININE 0.9 0.8   No results for input(s): AST, ALT, ALKPHOS, BILITOT, PROT, ALBUMIN in the last 8760 hours. Recent Labs    08/22/17 02/27/18  WBC 5.9 7.0  HGB 12.5 11.3*  HCT 30* 21*  PLT 190 199   Lab Results  Component Value Date   TSH 4.58 10/10/2016   No results found for: HGBA1C Lab Results  Component Value Date   CHOL 159 02/09/2017   HDL 49 02/09/2017   LDLCALC 96 02/09/2017   TRIG 69 02/09/2017   CHOLHDL 3.2 02/09/2017    Significant Diagnostic Results in last 30 days:  No results found.  Assessment/Plan 1. Chronic systolic heart failure (HCC) -no signs of acute volume overload; wt down slightly; cont beta blocker, lasix, losartan and monitor  2. CKD (chronic kidney disease) stage 3, GFR 30-59 ml/min (HCC) -Avoid nephrotoxic agents like nsaids, dose adjust renally excreted meds, hydrate. -if intake becomes consistently poor, hold arb and adjust diuretics  3. Vascular dementia without behavioral disturbance (HCC) -seems a bit worse  cognitively today, but may be due to loneliness missing her family visits  4. Adjustment disorder with depressed mood -cont some regular face time visits and phone communications with family to keep her spirits up during this social isolation time with covid-19  5. Coronary artery disease involving native coronary artery of native heart without angina pectoris -cont asa, beta blocker, arb, off statin at 89 with dementia   6. Anemia of chronic disease -last hgb was 11.3 in December, monitor 1-2 times per year; no evidence of bleeding   Family/ staff Communication: discussed with SNF nurse  Labs/tests ordered:  No new today  Janalyn Higby L. Traeh Milroy, D.O. Geriatrics [facility] [address] [place], [place] [address] Cell Phone (Mon-Fri 8am-5pm):  [phone] On Call:  [phone] & follow prompts after 5pm & weekends Office Phone:  [phone] Office Fax:  [phone]

## 2018-07-29 ENCOUNTER — Other Ambulatory Visit: Payer: Self-pay

## 2018-07-29 ENCOUNTER — Ambulatory Visit (INDEPENDENT_AMBULATORY_CARE_PROVIDER_SITE_OTHER): Payer: Medicare Other | Admitting: *Deleted

## 2018-07-29 DIAGNOSIS — I495 Sick sinus syndrome: Secondary | ICD-10-CM | POA: Diagnosis not present

## 2018-07-29 DIAGNOSIS — I10 Essential (primary) hypertension: Secondary | ICD-10-CM

## 2018-07-30 LAB — CUP PACEART REMOTE DEVICE CHECK
Battery Impedance: 1304 Ohm
Battery Remaining Longevity: 43 mo
Battery Voltage: 2.76 V
Brady Statistic AP VP Percent: 44 %
Brady Statistic AP VS Percent: 0 %
Brady Statistic AS VP Percent: 56 %
Brady Statistic AS VS Percent: 0 %
Date Time Interrogation Session: 20200512162827
Implantable Lead Implant Date: 20071005
Implantable Lead Implant Date: 20071005
Implantable Lead Location: 753858
Implantable Lead Location: 753859
Implantable Lead Model: 4194
Implantable Lead Model: 5076
Implantable Pulse Generator Implant Date: 20130307
Lead Channel Impedance Value: 413 Ohm
Lead Channel Impedance Value: 811 Ohm
Lead Channel Pacing Threshold Amplitude: 0.625 V
Lead Channel Pacing Threshold Amplitude: 0.875 V
Lead Channel Pacing Threshold Pulse Width: 0.4 ms
Lead Channel Pacing Threshold Pulse Width: 0.4 ms
Lead Channel Setting Pacing Amplitude: 2 V
Lead Channel Setting Pacing Amplitude: 2.5 V
Lead Channel Setting Pacing Pulse Width: 0.4 ms
Lead Channel Setting Sensing Sensitivity: 5.6 mV

## 2018-08-07 ENCOUNTER — Non-Acute Institutional Stay (SKILLED_NURSING_FACILITY): Payer: Medicare Other | Admitting: Adult Health

## 2018-08-07 ENCOUNTER — Encounter: Payer: Self-pay | Admitting: Adult Health

## 2018-08-07 DIAGNOSIS — K5901 Slow transit constipation: Secondary | ICD-10-CM

## 2018-08-07 DIAGNOSIS — N183 Chronic kidney disease, stage 3 unspecified: Secondary | ICD-10-CM

## 2018-08-07 DIAGNOSIS — R4589 Other symptoms and signs involving emotional state: Secondary | ICD-10-CM

## 2018-08-07 DIAGNOSIS — I5022 Chronic systolic (congestive) heart failure: Secondary | ICD-10-CM

## 2018-08-07 DIAGNOSIS — I495 Sick sinus syndrome: Secondary | ICD-10-CM | POA: Diagnosis not present

## 2018-08-07 DIAGNOSIS — F0150 Vascular dementia without behavioral disturbance: Secondary | ICD-10-CM

## 2018-08-07 NOTE — Progress Notes (Signed)
Location:  Medical illustrator of Service:  SNF (31) Provider:   [name], ANP [facility] [phone]   [name], DO  Patient Care Team: [name], DO as PCP - General (Geriatric Medicine) [name], MD as PCP - Cardiology (Cardiology) [name], NP as Nurse Practitioner (Nurse Practitioner)  Extended Emergency Contact Information Primary Emergency Contact: [name] Address: [address], [address]          [place], [place] [address] [name] Home Phone: [phone] Relation: Other Secondary Emergency Contact: [name] [place] Home Phone: [phone] Relation: Daughter  Code Status:  DNR Goals of care: Advanced Directive information Advanced Directives 07/15/2018  Does Patient Have a Medical Advance Directive? Yes  Type of Estate agent of [place];Out of facility DNR (pink MOST or yellow form)  Does patient want to make changes to medical advance directive? No - Patient declined  Copy of Healthcare Power of Attorney in Chart? Yes - validated most recent copy scanned in chart (See row information)  Pre-existing out of facility DNR order (yellow form or pink MOST form) Yellow form placed in chart (order not valid for inpatient use);Pink MOST form placed in chart (order not valid for inpatient use)     Chief Complaint  Patient presents with  . Medical Management of Chronic Issues    HPI:  Pt is a 85 y.o. female seen today for medical management of chronic diseases.   She has a hx of dementia, CHF, SSS s/p pacemaker, gout, hip fracture, constipation, CKD, gerd, ulcer disease, dysphagia, CAD with stent placement, spinal stenosis, and PE.  Ms. Jess has dementia and so most of the history is obtained from the chart and her caretaker. Her caretaker reports she is in her usual state of health. NO sob, doe, cp, or increased edema. She has  occasional abd pain but this subsides easily. Her appetite is good and her bowels are moving regularly. She has some times where she appears anxious or tearful but she can no elaborate further.   Past Medical History:  Diagnosis Date  . CAD (coronary artery disease)   . Chronic low back pain   . Contusion of foot, right 05/15/12  . Dementia (HCC) 10/16/2016   06/25/17 MMSE 18/30 passed clock  . Diverticulitis   . Dyslipidemia   . Dysphagia 12/11/2016  . H/O: hysterectomy   . HTN (hypertension)   . Memory disturbance 05/2012   MMSE 26/30, intact Clock test  . Nonischemic cardiomyopathy (HCC)   . Osteoporosis   . Pulmonary embolism (HCC)   . Right knee pain 05/15/12  . Spinal stenosis   . Ulcer disease   . Weight loss    Past Surgical History:  Procedure Laterality Date  . APPENDECTOMY  2010  . BACK SURGERY    . BIV ICD GENERTAOR CHANGE OUT N/A 05/24/2011   Procedure: BIV ICD GENERTAOR CHANGE OUT;  Surgeon: [name], MD;  Location: [facility] CATH LAB;  Service: Cardiovascular;  Laterality: N/A;  . CATARACT EXTRACTION    . COLON RESECTION  2005  . CORONARY ANGIOPLASTY WITH STENT PLACEMENT  01/2003  . HEMICOLECTOMY    . HIP ARTHROPLASTY Left 04/05/2014   Procedure: ARTHROPLASTY BIPOLAR HIP;  Surgeon: Shelda Pal, MD;  Location: [facility] ORS;  Service: Orthopedics;  Laterality: Left;  . left shoulder replacement  1991   Dr. [name]  . ORIF HIP FRACTURE Left 09/30/2016  Procedure: OPEN REDUCTION INTERNAL FIXATION PERIPROSTHETIC HIP FRACTURE;  Surgeon: Ollen Gross, MD;  Location: [facility] ORS;  Service: Orthopedics;  Laterality: Left;  . right shoulder repair  1985   Dr. [name]  . rotator cuff debridement  1999   Dr. [name]    Allergies  Allergen Reactions  . Arthrotec [Diclofenac-Misoprostol]   . Ibuprofen   . Lactose Intolerance (Gi)   . Milk-Related Compounds Nausea And Vomiting    Cheese  . Other     Grass and ragweed   . Oxycontin [Oxycodone Hcl]   . Pollen Extract     Outpatient  Encounter Medications as of 08/07/2018  Medication Sig  . acetaminophen (TYLENOL) 325 MG tablet Take 650 mg by mouth every 6 (six) hours as needed for mild pain.  Marland Kitchen albuterol (PROVENTIL HFA;VENTOLIN HFA) 108 (90 Base) MCG/ACT inhaler Inhale 2 puffs into the lungs every 6 (six) hours as needed for wheezing or shortness of breath.  . allopurinol (ZYLOPRIM) 100 MG tablet Take 100 mg by mouth daily.  Marland Kitchen aspirin EC 81 MG tablet Take 81 mg by mouth daily.  . Carboxymethylcellul-Glycerin (OPTIVE) 0.5-0.9 % SOLN Apply 2 drops to eye 2 (two) times daily.  . carvedilol (COREG) 3.125 MG tablet Take 1.5625 mg by mouth 2 (two) times daily with a meal.   . cyanocobalamin 1000 MCG tablet Take 1,000 mcg by mouth daily.   Marland Kitchen docusate (COLACE) 50 MG/5ML liquid Take 100 mg by mouth daily.  . furosemide (LASIX) 20 MG tablet Take 20 mg by mouth every other day. Alternate between 40mg  every other day  . furosemide (LASIX) 40 MG tablet Take 40 mg by mouth every other day. Alternate with 20mg  lasix  . lactose free nutrition (BOOST PLUS) LIQD Take 237 mLs by mouth daily.   Marland Kitchen losartan (COZAAR) 25 MG tablet Take 0.5 tablets (12.5 mg total) by mouth at bedtime.  Marland Kitchen omeprazole (PRILOSEC OTC) 20 MG tablet Take 20 mg by mouth 2 (two) times daily.  . polyethylene glycol (MIRALAX / GLYCOLAX) packet Take 17 g by mouth daily. Hold for loose stools  . potassium chloride (KLOR-CON) 20 MEQ packet Take 20 mEq by mouth every other day.  . Zinc Oxide (DESITIN) 13 % CREA Apply topically as needed (after each bathroom use).   No facility-administered encounter medications on file as of 08/07/2018.     Review of Systems  Constitutional: Negative for activity change, appetite change, chills, diaphoresis, fatigue, fever and unexpected weight change.  HENT: Negative for congestion.   Respiratory: Negative for cough, shortness of breath and wheezing.   Cardiovascular: Negative for chest pain, palpitations and leg swelling.  Gastrointestinal:  Negative for abdominal distention, abdominal pain, constipation and diarrhea.  Genitourinary: Negative for difficulty urinating and dysuria.  Musculoskeletal: Positive for gait problem. Negative for arthralgias, back pain, joint swelling and myalgias.  Skin: Negative for wound.  Neurological: Negative for dizziness, tremors, seizures, syncope, facial asymmetry, speech difficulty, weakness, light-headedness, numbness and headaches.  Psychiatric/Behavioral: Positive for confusion. Negative for agitation and behavioral problems.    Immunization History  Administered Date(s) Administered  . Influenza Inj Mdck Quad Pf 01/05/2016  . Influenza,inj,Quad PF,6+ Mos 01/07/2018  . Influenza-Unspecified 12/22/2014, 01/10/2017  . Pneumococcal Conjugate-13 12/14/2013  . Pneumococcal Polysaccharide-23 03/29/2016  . Tdap 03/29/2016  . Zoster 11/03/2007  . Zoster Recombinat (Shingrix) 04/01/2017, 06/27/2017   Pertinent  Health Maintenance Due  Topic Date Due  . PNA vac Low Risk Adult  Completed  . DEXA SCAN  Discontinued  Fall Risk  04/22/2018  Falls in the past year? 0  Number falls in past yr: 0  Injury with Fall? 0  Risk for fall due to : Impaired mobility;Medication side effect;Mental status change;Impaired balance/gait  Follow up Falls evaluation completed;Education provided  Comment seen by therapy for all of this   Functional Status Survey:    Vitals:   08/07/18 1542  Weight: 170 lb (77.1 kg)   Body mass index is 28.29 kg/m.  Wt Readings from Last 3 Encounters:  08/07/18 170 lb (77.1 kg)  07/15/18 171 lb (77.6 kg)  06/12/18 173 lb 6.4 oz (78.7 kg)   Physical Exam Vitals signs and nursing note reviewed.  Constitutional:      General: She is not in acute distress.    Appearance: She is not diaphoretic.  HENT:     Head: Normocephalic and atraumatic.  Neck:     Vascular: No carotid bruit or JVD.  Cardiovascular:     Rate and Rhythm: Normal rate and regular rhythm.     Heart  sounds: No murmur.  Pulmonary:     Effort: Pulmonary effort is normal. No respiratory distress.     Breath sounds: Rales present. No wheezing.  Abdominal:     General: Bowel sounds are normal. There is no distension.     Palpations: Abdomen is soft.  Musculoskeletal:     Right lower leg: No edema.     Left lower leg: No edema.  Lymphadenopathy:     Cervical: No cervical adenopathy.  Skin:    General: Skin is warm and dry.  Neurological:     General: No focal deficit present.     Mental Status: She is alert. Mental status is at baseline.  Psychiatric:     Comments: tearful     Labs reviewed: Recent Labs    11/25/17 02/27/18  NA 144 142  K 4.5 4.8  BUN 24* 27*  CREATININE 0.9 0.8   No results for input(s): AST, ALT, ALKPHOS, BILITOT, PROT, ALBUMIN in the last 8760 hours. Recent Labs    08/22/17 02/27/18  WBC 5.9 7.0  HGB 12.5 11.3*  HCT 30* 21*  PLT 190 199   Lab Results  Component Value Date   TSH 4.58 10/10/2016   No results found for: HGBA1C Lab Results  Component Value Date   CHOL 159 02/09/2017   HDL 49 02/09/2017   LDLCALC 96 02/09/2017   TRIG 69 02/09/2017   CHOLHDL 3.2 02/09/2017    Significant Diagnostic Results in last 30 days:  No results found.  Assessment/Plan 1. Vascular dementia without behavioral disturbance (HCC) Progressive decline in cognition over time but fairly stable over the past year. Continue supportive care in the skilled environment  2. Dysphoric mood Tearful today which may be due to social isolation associated with Covid 19. I have asked the nurse to monitor for s/s of depression and report back to [place] if this situation continues so we can restart zoloft. Her mood is somewhat difficult to assess due to underlying dementia.   3. Chronic systolic heart failure (HCC) Compensated. Continue current dose of lasix, coreg, and losartan. Monitor weight and BP at regular intervals.   4. Sick sinus syndrome [facility]) S/p pacemaker, remove  transmission done 07/29/18  5. CKD (chronic kidney disease) stage 3, GFR 30-59 ml/min (HCC) Continue to periodically monitor BMP and avoid nephrotoxic agents  6. Slow transit constipation Controlled, continue miralax 17 grams qd    Family/ staff Communication: resident and staff  Labs/tests ordered:  NA

## 2018-08-12 NOTE — Progress Notes (Signed)
Remote pacemaker transmission.   

## 2018-08-16 IMAGING — CT CT ANGIO CHEST-ABD-PELV FOR DISSECTION W/ AND WO/W CM
2 of 7 series · 13 of 46 positions shown, 15 images · IV contrast (APPLIED)
Comparison: Same day CXR.  Chest CT from 02/06/2011.

CLINICAL DATA: Dyspnea x3 days.

EXAM:
CT ANGIOGRAPHY CHEST, ABDOMEN AND PELVIS
TECHNIQUE: Multidetector CT imaging through the chest, abdomen and pelvis was
performed using the standard protocol during bolus administration of
intravenous contrast. Multiplanar reconstructed images and MIPs were
obtained and reviewed to evaluate the vascular anatomy.
CONTRAST:  100mL [id number] IOPAMIDOL ([id number]) INJECTION 76%

[Series 9: arterial · axial · arterial · 0.59mm/px · z∈[+894,+1470]mm · 10 of 321 slices shown, 12 images]
[im 17/321  soft-tissue]
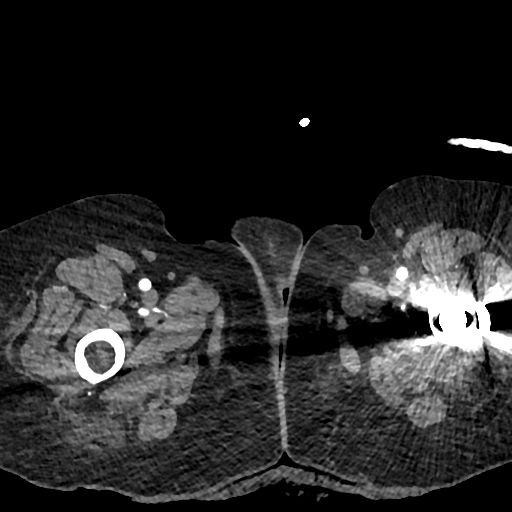
[im 17/321  bone]
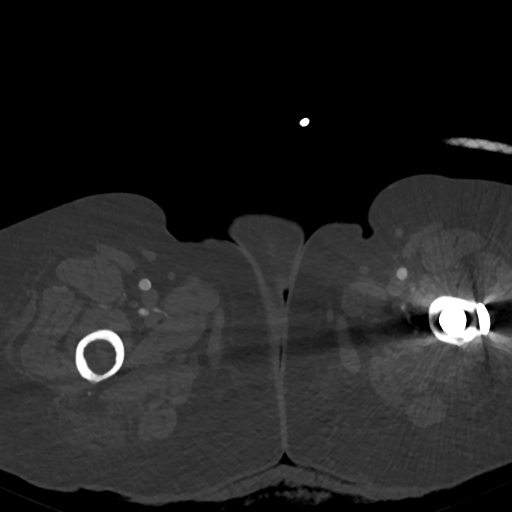
[im 49/321  soft-tissue]
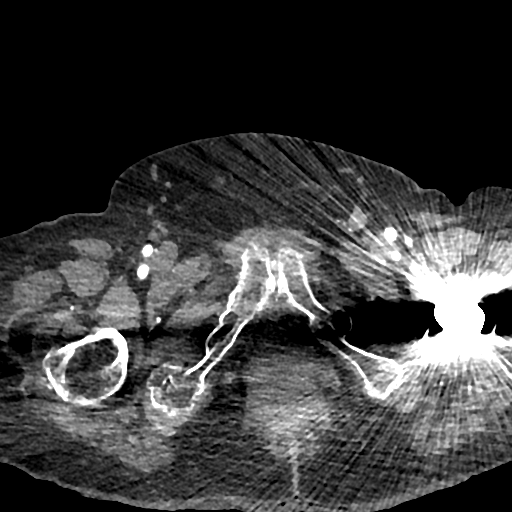
[im 81/321  soft-tissue]
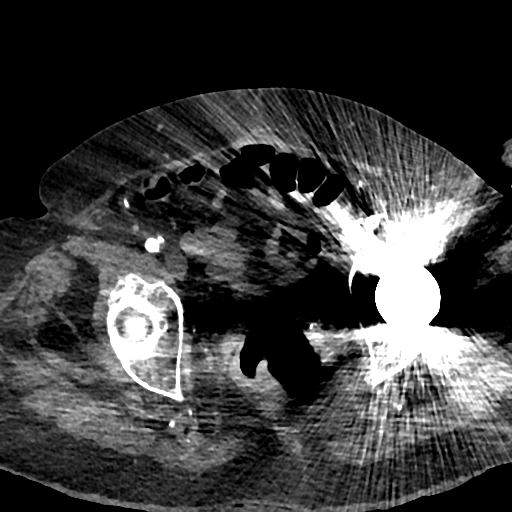
[im 113/321  soft-tissue]
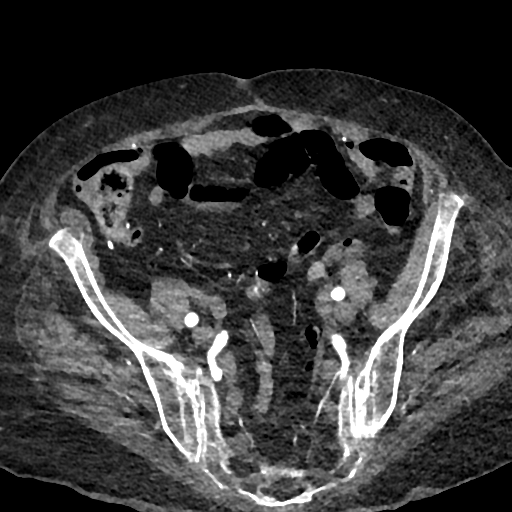
[im 145/321  soft-tissue]
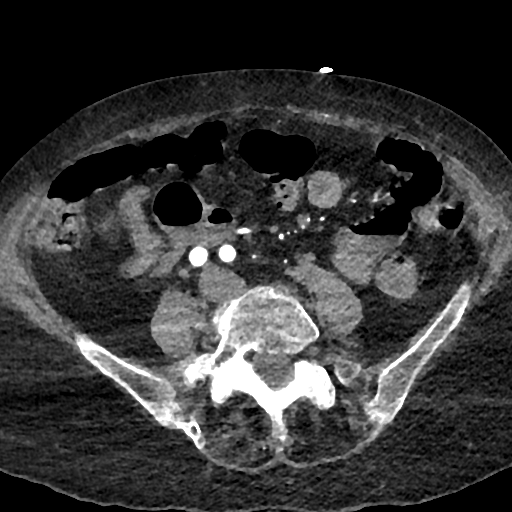
[im 177/321  soft-tissue]
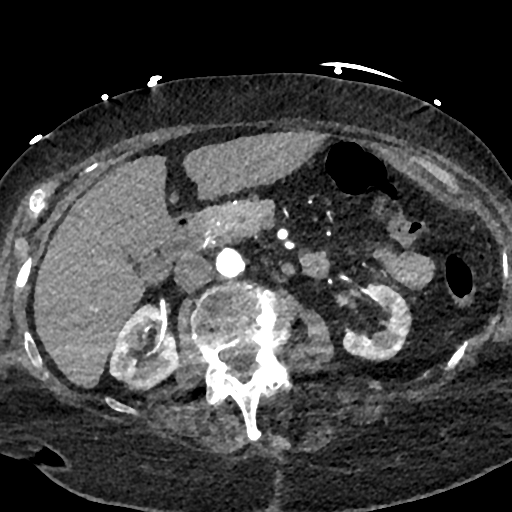
[im 209/321  soft-tissue]
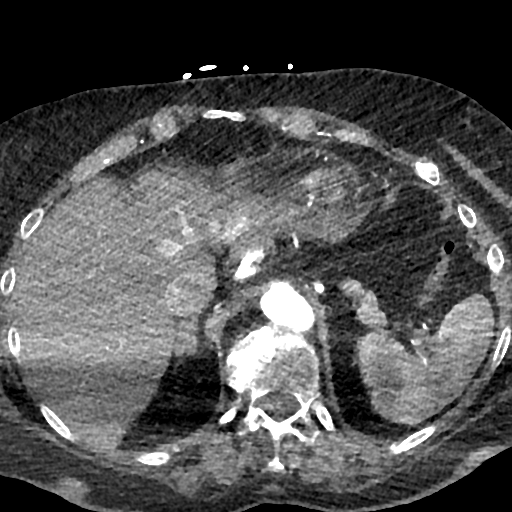
[im 241/321  soft-tissue]
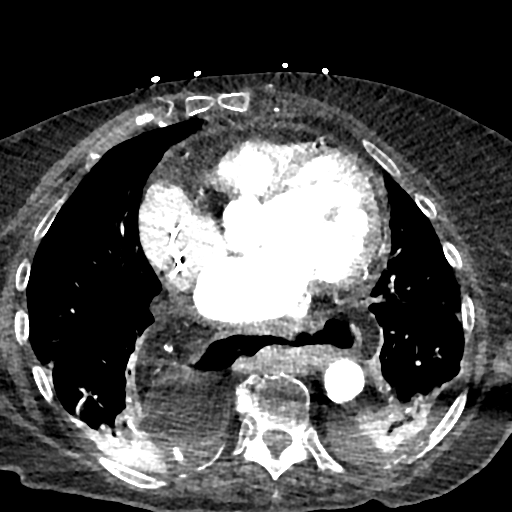
[im 273/321  soft-tissue]
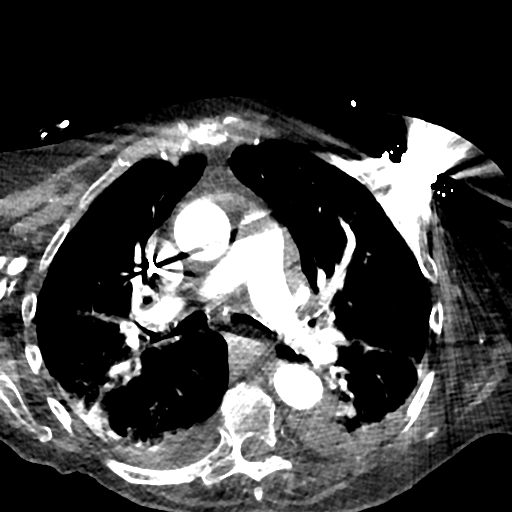
[im 273/321  bone]
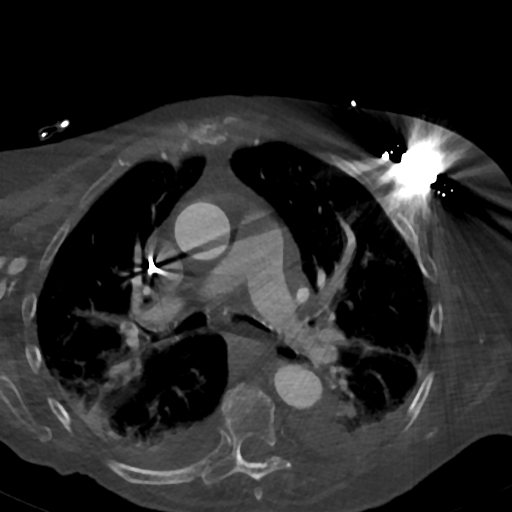
[im 305/321  soft-tissue]
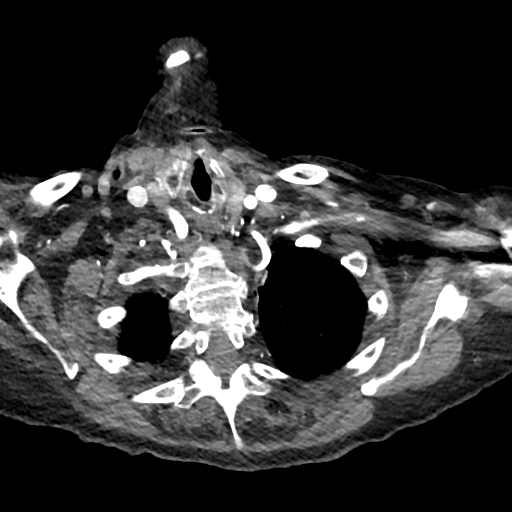

[Series 12: cor · coronal · 0.75mm/px · 3 of 151 slices shown]
[im 38/151  soft-tissue]
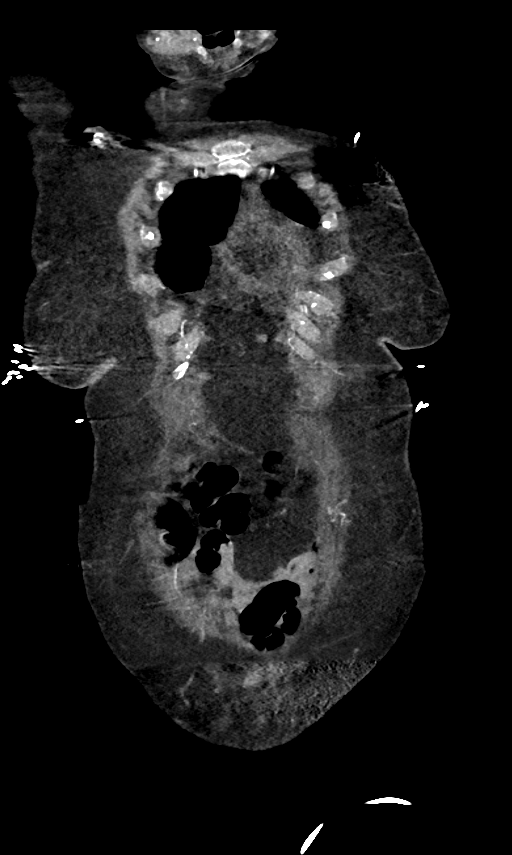
[im 76/151  soft-tissue]
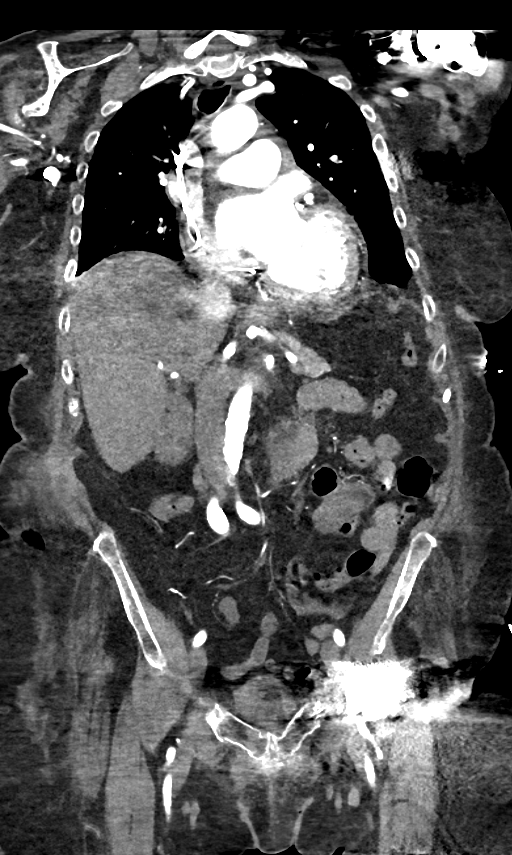
[im 113/151  soft-tissue]
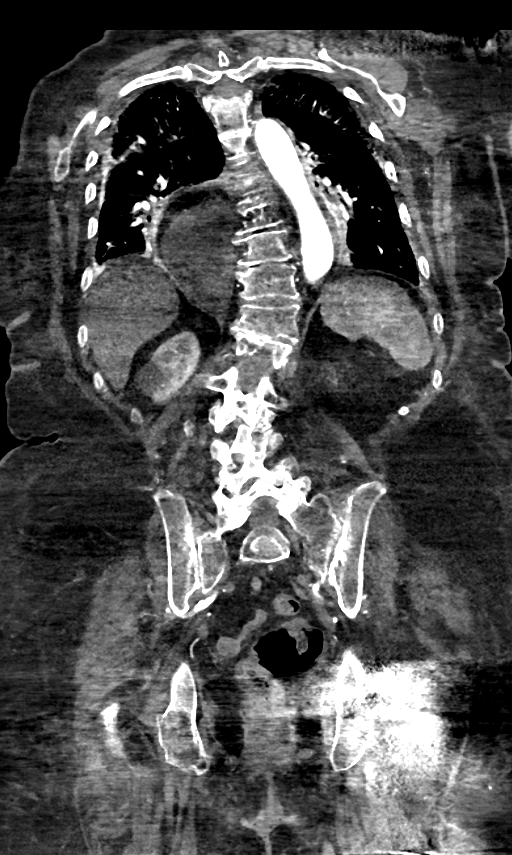

[13 of 46 positions shown; findings below may reference images not displayed]

FINDINGS: CTA CHEST FINDINGS

Cardiovascular: Stable cardiomegaly with ICD device along the left
anterior chest wall and leads in the right atrium coronary sinus and
right ventricle. No pericardial effusion. Left main and three-vessel
coronary arteriosclerosis. No large central pulmonary embolus.

Mediastinum/Nodes: Mild atherosclerosis of the thoracic aorta at the
arch and along the descending portion. No aneurysm or dissection. No
mediastinal adenopathy. Patent trachea and mainstem bronchi. Large
hiatal hernia.

Lungs/Pleura: Small bilateral pleural effusions adjacent to the
large hiatal hernia. Areas of compressive atelectasis are noted at
each lung base with dependent atelectasis along the posterior aspect
of both lower lobes. Respiratory motion artifacts limit assessment.

Musculoskeletal: Thoracic spondylosis with kyphosis attributable to
multilevel degenerative disc disease. No acute nor suspicious
osseous abnormality. Left shoulder arthroplasty. Chronic posterior
left lower rib fractures.

Review of the MIP images confirms the above findings.

CTA ABDOMEN AND PELVIS FINDINGS

VASCULAR

Aorta: No aneurysm or dissection. Moderate atherosclerosis. No
significant stenosis.

Celiac: Minimal atherosclerosis at the origin. The celiac axis and
branch vessels are patent without significant stenosis, aneurysm or
dissection.

SMA: Atherosclerosis at the origin of the SMA without significant
stenosis. No occlusion, dissection or aneurysm.

Renals: Atherosclerosis of the origins of both renal arteries
without significant luminal narrowing, fibromuscular dysplasia,
aneurysm or dissection.

IMA: Patent.

Inflow: Patent without evidence of aneurysm, dissection, vasculitis
or significant stenosis. Mild atherosclerosis of the right common
and both internal iliac arteries.

Veins: No obvious venous abnormality within the limitations of this
arterial phase study.

Review of the MIP images confirms the above findings.

NON-VASCULAR

Hepatobiliary: No focal liver abnormality is seen. Tiny layering
calculi within the physiologically distended gallbladder. No mural
thickening or pericholecystic fluid.

Pancreas: Normal without ductal dilatation or enhancing mass.

Spleen: No splenomegaly or mass.

Adrenals/Urinary Tract: Bilateral renal cortical thinning with right
interpolar renal cyst measuring 2.9 cm. Mild thickening of the
adrenal glands without focal mass. No nephrolithiasis nor
hydroureteronephrosis. The urinary bladder is partially obscured by
the patient's hip arthroplasty on the left.

Stomach/Bowel: Large hiatal hernia as previously mentioned. No bowel
obstruction or inflammation. Partial colectomy.

Lymphatic: No lymphadenopathy.

Reproductive: The lower pelvis is obscured by the patient's left hip
arthroplasty. The uterus is either atrophic or surgically absent. No
adnexal mass.

Other: No free air free fluid.

Musculoskeletal: Old posttraumatic deformity of the pubic rami with
healing. Dextroscoliosis of the upper thoracic spine. Osteoarthritis
of the native right hip. Lumbar spondylosis.

Review of the MIP images confirms the above findings.
IMPRESSION: 1. Small bilateral pleural effusions with compressive atelectasis
adjacent to a large known hiatal hernia containing much of the
stomach. No active pulmonary disease.
2. Minimal thoracic aortic atherosclerosis without aneurysmal
dilatation or evidence of dissection.
3. Left main and three-vessel coronary arteriosclerosis.
4. Ectasia of the abdominal aorta with patent branch vessels.
Minimal atherosclerosis at the origins of the branch vessels as
above described without significant luminal narrowing.
5. Thoracolumbar spondylosis with left hip and shoulder
arthroplasties. Lumbar dextroscoliosis. Chronic bilateral pubic rami
fractures.
6. 2.9 cm right interpolar renal cysts with mild cortical thinning
of both kidneys. No obstructive uropathy.
7. Uncomplicated appearing cholelithiasis.

## 2018-08-28 DIAGNOSIS — Z20828 Contact with and (suspected) exposure to other viral communicable diseases: Secondary | ICD-10-CM | POA: Diagnosis not present

## 2018-09-11 ENCOUNTER — Non-Acute Institutional Stay (SKILLED_NURSING_FACILITY): Payer: Medicare Other | Admitting: Adult Health

## 2018-09-11 DIAGNOSIS — R1310 Dysphagia, unspecified: Secondary | ICD-10-CM | POA: Diagnosis not present

## 2018-09-11 DIAGNOSIS — M1A09X0 Idiopathic chronic gout, multiple sites, without tophus (tophi): Secondary | ICD-10-CM

## 2018-09-11 DIAGNOSIS — N183 Chronic kidney disease, stage 3 unspecified: Secondary | ICD-10-CM

## 2018-09-11 DIAGNOSIS — F0150 Vascular dementia without behavioral disturbance: Secondary | ICD-10-CM

## 2018-09-11 DIAGNOSIS — I5022 Chronic systolic (congestive) heart failure: Secondary | ICD-10-CM | POA: Diagnosis not present

## 2018-09-11 DIAGNOSIS — D638 Anemia in other chronic diseases classified elsewhere: Secondary | ICD-10-CM

## 2018-09-12 ENCOUNTER — Encounter: Payer: Self-pay | Admitting: Adult Health

## 2018-09-12 NOTE — Progress Notes (Signed)
Location:  Occupational psychologist of Service:  SNF (31) Provider:   Cindi Carbon, ANP [place] [phone]   Gayland Curry, DO  Patient Care Team: Gayland Curry, DO as PCP - General (Geriatric Medicine) Evans Lance, MD as PCP - Cardiology (Cardiology) Royal Hawthorn, NP as Nurse Practitioner (Nurse Practitioner)  Extended Emergency Contact Information Primary Emergency Contact: Lynn Ito Address: [address]          [place], [place] [address] [place] Phone: [phone] Relation: Other Secondary Emergency Contact: Osker Mason [place] Phone: [phone] Relation: Daughter  Code Status:  DNR Goals of care: Advanced Directive information Advanced Directives 07/15/2018  Does Patient Have a Medical Advance Directive? Yes  Type of Paramedic of [place];Out of facility DNR (pink MOST or yellow form)  Does patient want to make changes to medical advance directive? No - Patient declined  Copy of [place] in Chart? Yes - validated most recent copy scanned in chart (See row information)  Pre-existing out of facility DNR order (yellow form or pink MOST form) Yellow form placed in chart (order not valid for inpatient use);Pink MOST form placed in chart (order not valid for inpatient use)     Chief Complaint  Patient presents with  . Medical Management of Chronic Issues    HPI:  Pt is a 85 y.o. female seen today for medical management of chronic diseases.  She resides in skilled care due to functional losses associated with dementia. There are no acute complaints regarding her care. Vitals are stable. She is eating less over time, during the pandemic and has lost 6-7 lbs. She denies any abd pain, n, v, d, etc. The staff have not noted any crying or anxiety. She continues to ambulate short distances with a walker but is mostly sedentary  in the recliner.   Gout: no joint swelling or pain on allopurinol.  Dysphagia: hx of this but she is currently on a regular diet and tolerating it well.  ACD: Lab Results  Component Value Date   HGB 11.3 (A) 69/48/5462   Hx of systolic CHF: weight trending down, no edema, sob, doe   Past Medical History:  Diagnosis Date  . CAD (coronary artery disease)   . Chronic low back pain   . Contusion of foot, right 05/15/12  . Dementia ([place]) 10/16/2016   06/25/17 MMSE 18/30 passed clock  . Diverticulitis   . Dyslipidemia   . Dysphagia 12/11/2016  . H/O: hysterectomy   . HTN (hypertension)   . Memory disturbance 05/2012   MMSE 26/30, intact Clock test  . Nonischemic cardiomyopathy ([place])   . Osteoporosis   . Pulmonary embolism ([place])   . Right knee pain 05/15/12  . Spinal stenosis   . Ulcer disease   . Weight loss    Past Surgical History:  Procedure Laterality Date  . APPENDECTOMY  2010  . BACK SURGERY    . BIV ICD GENERTAOR CHANGE OUT N/A 05/24/2011   Procedure: BIV ICD GENERTAOR CHANGE OUT;  Surgeon: Evans Lance, MD;  Location: [facility] CATH LAB;  Service: Cardiovascular;  Laterality: N/A;  . CATARACT EXTRACTION    . COLON RESECTION  2005  . CORONARY ANGIOPLASTY WITH STENT PLACEMENT  01/2003  . HEMICOLECTOMY    . HIP ARTHROPLASTY Left 04/05/2014   Procedure: ARTHROPLASTY BIPOLAR HIP;  Surgeon: Mauri Pole, MD;  Location: [facility] ORS;  Service: Orthopedics;  Laterality: Left;  . left shoulder replacement  1991   Dr. [name]  . ORIF HIP FRACTURE Left 09/30/2016   Procedure: OPEN REDUCTION INTERNAL FIXATION PERIPROSTHETIC HIP FRACTURE;  Surgeon: Ollen Gross, MD;  Location: [facility] ORS;  Service: Orthopedics;  Laterality: Left;  . right shoulder repair  1985   Dr. [name]  . rotator cuff debridement  1999   Dr. [name]    Allergies  Allergen Reactions  . Arthrotec [Diclofenac-Misoprostol]   . Ibuprofen   . Lactose Intolerance (Gi)   . Milk-Related Compounds Nausea And Vomiting    Cheese  .  Other     Grass and ragweed   . Oxycontin [Oxycodone Hcl]   . Pollen Extract     Outpatient Encounter Medications as of 09/11/2018  Medication Sig  . allopurinol (ZYLOPRIM) 100 MG tablet Take 100 mg by mouth daily.  Marland Kitchen aspirin EC 81 MG tablet Take 81 mg by mouth daily.  . carvedilol (COREG) 3.125 MG tablet Take 1.5625 mg by mouth 2 (two) times daily with a meal.   . cyanocobalamin 1000 MCG tablet Take 1,000 mcg by mouth daily.   Marland Kitchen docusate (COLACE) 50 MG/5ML liquid Take 100 mg by mouth daily.  . furosemide (LASIX) 20 MG tablet Take 20 mg by mouth every other day. Alternate between 40mg  every other day  . furosemide (LASIX) 40 MG tablet Take 40 mg by mouth every other day. Alternate with 20mg  lasix  . lactose free nutrition (BOOST PLUS) LIQD Take 237 mLs by mouth 2 (two) times daily between meals.   Marland Kitchen losartan (COZAAR) 25 MG tablet Take 0.5 tablets (12.5 mg total) by mouth at bedtime.  Marland Kitchen omeprazole (PRILOSEC OTC) 20 MG tablet Take 20 mg by mouth 2 (two) times daily.  . polyethylene glycol (MIRALAX / GLYCOLAX) packet Take 17 g by mouth daily. Hold for loose stools  . potassium chloride (KLOR-CON) 20 MEQ packet Take 20 mEq by mouth every other day.  . Zinc Oxide (DESITIN) 13 % CREA Apply topically as needed (after each bathroom use).  Marland Kitchen acetaminophen (TYLENOL) 325 MG tablet Take 650 mg by mouth 3 (three) times daily.   Marland Kitchen albuterol (PROVENTIL HFA;VENTOLIN HFA) 108 (90 Base) MCG/ACT inhaler Inhale 2 puffs into the lungs every 6 (six) hours as needed for wheezing or shortness of breath.  . Carboxymethylcellul-Glycerin (OPTIVE) 0.5-0.9 % SOLN Apply 2 drops to eye 2 (two) times daily.   No facility-administered encounter medications on file as of 09/11/2018.     Review of Systems  Constitutional: Positive for unexpected weight change. Negative for activity change, appetite change, chills, diaphoresis, fatigue and fever.  HENT: Negative for congestion.   Respiratory: Negative for cough, shortness  of breath and wheezing.   Cardiovascular: Negative for chest pain, palpitations and leg swelling.  Gastrointestinal: Negative for abdominal distention, abdominal pain, constipation and diarrhea.  Genitourinary: Negative for difficulty urinating and dysuria.  Musculoskeletal: Positive for gait problem. Negative for arthralgias, back pain, joint swelling and myalgias.  Neurological: Negative for dizziness, tremors, seizures, syncope, facial asymmetry, speech difficulty, weakness, light-headedness, numbness and headaches.  Psychiatric/Behavioral: Positive for confusion. Negative for agitation and behavioral problems.    Immunization History  Administered Date(s) Administered  . Influenza Inj Mdck Quad Pf 01/05/2016  . Influenza,inj,Quad PF,6+ Mos 01/07/2018  . Influenza-Unspecified 12/22/2014, 01/10/2017  . Pneumococcal Conjugate-13 12/14/2013  . Pneumococcal Polysaccharide-23 03/29/2016  . Tdap 03/29/2016  . Zoster 11/03/2007  . Zoster Recombinat (Shingrix) 04/01/2017, 06/27/2017   Pertinent  Health  Maintenance Due  Topic Date Due  . PNA vac Low Risk Adult  Completed  . DEXA SCAN  Discontinued   Fall Risk  04/22/2018  Falls in the past year? 0  Number falls in past yr: 0  Injury with Fall? 0  Risk for fall due to : Impaired mobility;Medication side effect;Mental status change;Impaired balance/gait  Follow up Falls evaluation completed;Education provided  Comment seen by therapy for all of this   Functional Status Survey:    Vitals:   09/11/18 1142  Weight: 164 lb 8 oz (74.6 kg)   Body mass index is 27.37 kg/m.  Wt Readings from Last 3 Encounters:  09/11/18 164 lb 8 oz (74.6 kg)  08/07/18 170 lb (77.1 kg)  07/15/18 171 lb (77.6 kg)   Physical Exam Vitals signs and nursing note reviewed.  Constitutional:      General: She is not in acute distress.    Appearance: She is not diaphoretic.  HENT:     Head: Normocephalic and atraumatic.  Neck:     Vascular: No JVD.   Cardiovascular:     Rate and Rhythm: Normal rate and regular rhythm.     Heart sounds: No murmur.  Pulmonary:     Effort: Pulmonary effort is normal. No respiratory distress.     Breath sounds: Rales present. No wheezing.  Abdominal:     General: Bowel sounds are normal. There is no distension.  Musculoskeletal:        General: No swelling or tenderness.     Right lower leg: No edema.     Left lower leg: No edema.  Skin:    General: Skin is warm and dry.  Neurological:     General: No focal deficit present.     Mental Status: She is alert. Mental status is at baseline.  Psychiatric:        Mood and Affect: Mood normal.     Labs reviewed: Recent Labs    11/25/17 02/27/18  NA 144 142  K 4.5 4.8  BUN 24* 27*  CREATININE 0.9 0.8   No results for input(s): AST, ALT, ALKPHOS, BILITOT, PROT, ALBUMIN in the last 8760 hours. Recent Labs    02/27/18  WBC 7.0  HGB 11.3*  HCT 21*  PLT 199   Lab Results  Component Value Date   TSH 4.58 10/10/2016   No results found for: HGBA1C Lab Results  Component Value Date   CHOL 159 02/09/2017   HDL 49 02/09/2017   LDLCALC 96 02/09/2017   TRIG 69 02/09/2017   CHOLHDL 3.2 02/09/2017    Significant Diagnostic Results in last 30 days:  No results found.  Assessment/Plan 1. Chronic systolic heart failure (HCC) Compensated. Continue lasix and losartan  2. Vascular dementia without behavioral disturbance (HCC) Progressive decline in cognition and physical function c/w the disease. Continue supportive care in the skilled environment.  3. Dysphagia, unspecified type Not currently an issue  4. CKD (chronic kidney disease) stage 3, GFR 30-59 ml/min (HCC) Continue to periodically monitor BMP and avoid nephrotoxic agents  5. Anemia of chronic disease Continue to monitor CBC at regular intervals  6. Idiopathic chronic gout of multiple sites without tophus Continue allopurinol 100 mg qd    Family/ staff Communication: staff   Labs/tests ordered:  NA

## 2018-10-14 ENCOUNTER — Non-Acute Institutional Stay (SKILLED_NURSING_FACILITY): Payer: Medicare Other | Admitting: Internal Medicine

## 2018-10-14 ENCOUNTER — Encounter: Payer: Self-pay | Admitting: Internal Medicine

## 2018-10-14 DIAGNOSIS — I5022 Chronic systolic (congestive) heart failure: Secondary | ICD-10-CM | POA: Diagnosis not present

## 2018-10-14 DIAGNOSIS — R4589 Other symptoms and signs involving emotional state: Secondary | ICD-10-CM

## 2018-10-14 DIAGNOSIS — K449 Diaphragmatic hernia without obstruction or gangrene: Secondary | ICD-10-CM

## 2018-10-14 DIAGNOSIS — N183 Chronic kidney disease, stage 3 unspecified: Secondary | ICD-10-CM

## 2018-10-14 DIAGNOSIS — F0150 Vascular dementia without behavioral disturbance: Secondary | ICD-10-CM | POA: Diagnosis not present

## 2018-10-14 DIAGNOSIS — D692 Other nonthrombocytopenic purpura: Secondary | ICD-10-CM

## 2018-10-14 NOTE — Progress Notes (Signed)
Location:  [facility] Room Number: 111 Place of Service:  SNF (31) Provider:  Rivers Hamrick L. Renato Gails, D.O., C.M.D.  [name], DO  Patient Care Team: [name], DO as PCP - General (Geriatric Medicine) [name], MD as PCP - Cardiology (Cardiology) [name], NP as Nurse Practitioner (Nurse Practitioner)  Extended Emergency Contact Information Primary Emergency Contact: [name] Address: [address]          [place], [place] [address] [name] Home Phone: [phone] Relation: Other Secondary Emergency Contact: [name] [place] Home Phone: [phone] Relation: Daughter  Code Status:  DNR Goals of care: Advanced Directive information Advanced Directives 07/15/2018  Does Patient Have a Medical Advance Directive? Yes  Type of Estate agent of [place];Out of facility DNR (pink MOST or yellow form)  Does patient want to make changes to medical advance directive? No - Patient declined  Copy of Healthcare Power of Attorney in Chart? Yes - validated most recent copy scanned in chart (See row information)  Pre-existing out of facility DNR order (yellow form or pink MOST form) Yellow form placed in chart (order not valid for inpatient use);Pink MOST form placed in chart (order not valid for inpatient use)   Chief Complaint  Patient presents with  . Medical Management of Chronic Issues    Routine Visit    HPI:  Pt is a 85 y.o. female seen today for medical management of chronic diseases.    She was up sitting in her wheelchair just after lunch.  She appeared sad to me.  She denied pain at present.  She's been reporting some abdominal discomfort associated with indigestion at times and has been tearful when this occurs.  SNF nurse has noted that she gets relief with mylanta for this.    Otherwise, nursing and home care aide report no new concerns. She  has a small light blue ecchymoses of her right shin.  She has lost weight this year, primarily from May to June.  Stable the past month.  I'm told she eats well.  Past Medical History:  Diagnosis Date  . CAD (coronary artery disease)   . Chronic low back pain   . Contusion of foot, right 05/15/12  . Dementia (HCC) 10/16/2016   06/25/17 MMSE 18/30 passed clock  . Diverticulitis   . Dyslipidemia   . Dysphagia 12/11/2016  . H/O: hysterectomy   . HTN (hypertension)   . Memory disturbance 05/2012   MMSE 26/30, intact Clock test  . Nonischemic cardiomyopathy (HCC)   . Osteoporosis   . Pulmonary embolism (HCC)   . Right knee pain 05/15/12  . Spinal stenosis   . Ulcer disease   . Weight loss    Past Surgical History:  Procedure Laterality Date  . APPENDECTOMY  2010  . BACK SURGERY    . BIV ICD GENERTAOR CHANGE OUT N/A 05/24/2011   Procedure: BIV ICD GENERTAOR CHANGE OUT;  Surgeon: [name], MD;  Location: [facility] CATH LAB;  Service: Cardiovascular;  Laterality: N/A;  . CATARACT EXTRACTION    . COLON RESECTION  2005  . CORONARY ANGIOPLASTY WITH STENT PLACEMENT  01/2003  . HEMICOLECTOMY    . HIP ARTHROPLASTY Left 04/05/2014   Procedure: ARTHROPLASTY BIPOLAR HIP;  Surgeon: Shelda Pal, MD;  Location: [facility] ORS;  Service: Orthopedics;  Laterality: Left;  . left shoulder replacement  1991   Dr. [name]  . ORIF HIP FRACTURE  Left 09/30/2016   Procedure: OPEN REDUCTION INTERNAL FIXATION PERIPROSTHETIC HIP FRACTURE;  Surgeon: Gaynelle Arabian, MD;  Location: [facility] ORS;  Service: Orthopedics;  Laterality: Left;  . right shoulder repair  1985   Dr. Collie Siad  . rotator cuff debridement  1999   Dr. Maureen Ralphs    Allergies  Allergen Reactions  . Arthrotec [Diclofenac-Misoprostol]   . Ibuprofen   . Lactose Intolerance (Gi)   . Milk-Related Compounds Nausea And Vomiting    Cheese  . Other     Grass and ragweed   . Oxycontin [Oxycodone Hcl]   . Pollen Extract     Outpatient Encounter Medications as of  10/14/2018  Medication Sig  . acetaminophen (TYLENOL) 325 MG tablet Take 650 mg by mouth 3 (three) times daily.   Marland Kitchen albuterol (PROVENTIL HFA;VENTOLIN HFA) 108 (90 Base) MCG/ACT inhaler Inhale 2 puffs into the lungs every 6 (six) hours as needed for wheezing or shortness of breath.  . allopurinol (ZYLOPRIM) 100 MG tablet Take 100 mg by mouth daily.  Marland Kitchen aspirin EC 81 MG tablet Take 81 mg by mouth daily.  . Carboxymethylcellul-Glycerin (OPTIVE) 0.5-0.9 % SOLN Apply 2 drops to eye 2 (two) times daily.  . carvedilol (COREG) 3.125 MG tablet Take 1.5625 mg by mouth 2 (two) times daily with a meal.   . cyanocobalamin 1000 MCG tablet Take 1,000 mcg by mouth daily.   Marland Kitchen docusate (COLACE) 50 MG/5ML liquid Take 100 mg by mouth daily.  . furosemide (LASIX) 20 MG tablet Take 20 mg by mouth every other day. Alternate between 40mg  every other day  . furosemide (LASIX) 40 MG tablet Take 40 mg by mouth every other day. Alternate with 20mg  lasix  . lactose free nutrition (BOOST PLUS) LIQD Take 237 mLs by mouth 2 (two) times daily between meals.   Marland Kitchen losartan (COZAAR) 25 MG tablet Take 0.5 tablets (12.5 mg total) by mouth at bedtime.  Marland Kitchen omeprazole (PRILOSEC OTC) 20 MG tablet Take 20 mg by mouth 2 (two) times daily.  . polyethylene glycol (MIRALAX / GLYCOLAX) packet Take 17 g by mouth daily. Hold for loose stools  . potassium chloride (KLOR-CON) 20 MEQ packet Take 20 mEq by mouth every other day.  . Zinc Oxide (DESITIN) 13 % CREA Apply topically as needed (after each bathroom use).   No facility-administered encounter medications on file as of 10/14/2018.     Review of Systems  Constitutional: Negative for chills, fever and malaise/fatigue.  HENT: Positive for hearing loss.   Eyes: Negative for blurred vision.  Respiratory: Negative for cough and shortness of breath.   Cardiovascular: Negative for chest pain, palpitations and leg swelling.  Gastrointestinal: Positive for heartburn and nausea. Negative for  abdominal pain, blood in stool, constipation, diarrhea, melena and vomiting.  Genitourinary: Negative for dysuria.  Musculoskeletal: Negative for falls and joint pain.  Skin: Negative for itching and rash.  Neurological: Negative for dizziness and loss of consciousness.  Endo/Heme/Allergies: Bruises/bleeds easily.  Psychiatric/Behavioral: Positive for memory loss. Negative for depression. The patient is not nervous/anxious and does not have insomnia.     Immunization History  Administered Date(s) Administered  . Influenza Inj Mdck Quad Pf 01/05/2016  . Influenza,inj,Quad PF,6+ Mos 01/07/2018  . Influenza-Unspecified 12/22/2014, 01/10/2017  . Pneumococcal Conjugate-13 12/14/2013  . Pneumococcal Polysaccharide-23 03/29/2016  . Tdap 03/29/2016  . Zoster 11/03/2007  . Zoster Recombinat (Shingrix) 04/01/2017, 06/27/2017   Pertinent  Health Maintenance Due  Topic Date Due  . PNA vac Low Risk Adult  Completed  .  DEXA SCAN  Discontinued   Fall Risk  04/22/2018  Falls in the past year? 0  Number falls in past yr: 0  Injury with Fall? 0  Risk for fall due to : Impaired mobility;Medication side effect;Mental status change;Impaired balance/gait  Follow up Falls evaluation completed;Education provided  Comment seen by therapy for all of this   Functional Status Survey:    Vitals:   10/14/18 1259  BP: 120/78  Pulse: 75  Resp: 19  Temp: (!) 96.7 F (35.9 C)  TempSrc: Oral  SpO2: 95%  Weight: 164 lb (74.4 kg)  Height: 5\' 5"  (1.651 m)   Body mass index is 27.29 kg/m. Physical Exam Vitals signs and nursing note reviewed.  Constitutional:      General: She is not in acute distress.    Appearance: Normal appearance. She is not toxic-appearing.  HENT:     Head: Normocephalic and atraumatic.  Cardiovascular:     Rate and Rhythm: Normal rate and regular rhythm.     Pulses: Normal pulses.     Heart sounds: Normal heart sounds.  Pulmonary:     Effort: Pulmonary effort is normal.      Breath sounds: Normal breath sounds.  Abdominal:     General: Bowel sounds are normal.  Musculoskeletal:        General: No swelling or tenderness.     Right lower leg: No edema.     Left lower leg: No edema.  Skin:    General: Skin is warm and dry.     Comments: Small ecchymoses to shin  Neurological:     Mental Status: She is alert. Mental status is at baseline.  Psychiatric:     Comments: Appears sad today--not smiling and talking less when asked questions     Labs reviewed: Recent Labs    11/25/17 02/27/18  NA 144 142  K 4.5 4.8  BUN 24* 27*  CREATININE 0.9 0.8   No results for input(s): AST, ALT, ALKPHOS, BILITOT, PROT, ALBUMIN in the last 8760 hours. Recent Labs    02/27/18  WBC 7.0  HGB 11.3*  HCT 21*  PLT 199   Lab Results  Component Value Date   TSH 4.58 10/10/2016   No results found for: HGBA1C Lab Results  Component Value Date   CHOL 159 02/09/2017   HDL 49 02/09/2017   LDLCALC 96 02/09/2017   TRIG 69 02/09/2017   CHOLHDL 3.2 02/09/2017    Assessment/Plan 1. Vascular dementia without behavioral disturbance (HCC) -seems to be progressing but compounded by #2; continue snf level of care and monitor  -less verbal and losing some weight (did and now stabilized again)  2. Dysphoric mood -recently appears down, but denies sadness when asked; role of social isolation may be there with most interactions with staff only and facetime with family  3. Hiatal hernia -with indigestion at times postprandially, responds to mylanta  4. Chronic systolic heart failure (HCC) -no signs of acute exacerbation, weight down, no dyspnea or edema, no med changes needed, monitor  5. CKD (chronic kidney disease) stage 3, GFR 30-59 ml/min (HCC) -Avoid nephrotoxic agents like nsaids, dose adjust renally excreted meds, hydrate.  6. Senile purpura (HCC) -noted easy bruising from thinning skin on shin likely during transfers and moving around in wheelchair   Family/  staff Communication: discussed with snf nurse and home care aid  Labs/tests ordered:  No new   Riven Mabile L. Lindzy Rupert, D.O. Geriatrics [facility] [address], [place] [address] Cell Phone (Mon-Fri 8am-5pm):  [phone] On Call:  [phone] & follow prompts after 5pm & weekends Office Phone:  [phone] Office Fax:  [phone]

## 2018-10-28 ENCOUNTER — Encounter: Payer: Medicare Other | Admitting: *Deleted

## 2018-10-31 ENCOUNTER — Telehealth: Payer: Self-pay | Admitting: Internal Medicine

## 2018-10-31 NOTE — Telephone Encounter (Signed)
New Message   Patient's daughter in law calling in stating that she sent a transmission today for the patient and would like a call back to confirm that it went through.

## 2018-11-03 NOTE — Telephone Encounter (Signed)
Spoke to pt daughter in law to let her know we did not receive the transmission. She states she will go back to pt house and have the nurses try again.

## 2018-11-04 NOTE — Telephone Encounter (Signed)
Spoke w/ pt daughter and informed her that we still have not received the remote transmission. She is going to take the monitor back to the facility tonight and have them try again. If it doesn't work today she will try again tomorrow on her lunch break.

## 2018-11-06 NOTE — Telephone Encounter (Signed)
Spoke w/ pt daughter and she stated that she hasn't tried to send the transmission since our last conversation. She was waiting on a certain staff member to help her and that individual will not be back until Friday 11/07/2018. She is going to try again tomorrow. She has the DC direct number and carelink tech support if they need additional help after business hours. Pt daughter verbalized understanding.

## 2018-11-07 ENCOUNTER — Ambulatory Visit (INDEPENDENT_AMBULATORY_CARE_PROVIDER_SITE_OTHER): Payer: Medicare Other | Admitting: *Deleted

## 2018-11-07 DIAGNOSIS — I495 Sick sinus syndrome: Secondary | ICD-10-CM

## 2018-11-07 NOTE — Telephone Encounter (Signed)
Remote transmission received 11/07/2018

## 2018-11-09 LAB — CUP PACEART REMOTE DEVICE CHECK
Battery Impedance: 1532 Ohm
Battery Remaining Longevity: 38 mo
Battery Voltage: 2.75 V
Brady Statistic AP VP Percent: 50 %
Brady Statistic AP VS Percent: 0 %
Brady Statistic AS VP Percent: 50 %
Brady Statistic AS VS Percent: 0 %
Date Time Interrogation Session: 20200821195352
Implantable Lead Implant Date: 20071005
Implantable Lead Implant Date: 20071005
Implantable Lead Location: 753858
Implantable Lead Location: 753859
Implantable Lead Model: 4194
Implantable Lead Model: 5076
Implantable Pulse Generator Implant Date: 20130307
Lead Channel Impedance Value: 420 Ohm
Lead Channel Impedance Value: 853 Ohm
Lead Channel Pacing Threshold Amplitude: 0.625 V
Lead Channel Pacing Threshold Amplitude: 0.875 V
Lead Channel Pacing Threshold Pulse Width: 0.4 ms
Lead Channel Pacing Threshold Pulse Width: 0.4 ms
Lead Channel Setting Pacing Amplitude: 2 V
Lead Channel Setting Pacing Amplitude: 2.5 V
Lead Channel Setting Pacing Pulse Width: 0.4 ms
Lead Channel Setting Sensing Sensitivity: 4 mV

## 2018-11-14 ENCOUNTER — Encounter: Payer: Self-pay | Admitting: Cardiology

## 2018-11-14 NOTE — Progress Notes (Signed)
Remote pacemaker transmission.   

## 2018-12-05 ENCOUNTER — Non-Acute Institutional Stay (SKILLED_NURSING_FACILITY): Payer: Medicare Other | Admitting: Adult Health

## 2018-12-05 DIAGNOSIS — N183 Chronic kidney disease, stage 3 unspecified: Secondary | ICD-10-CM

## 2018-12-05 DIAGNOSIS — I5022 Chronic systolic (congestive) heart failure: Secondary | ICD-10-CM | POA: Diagnosis not present

## 2018-12-05 DIAGNOSIS — M1A09X0 Idiopathic chronic gout, multiple sites, without tophus (tophi): Secondary | ICD-10-CM | POA: Diagnosis not present

## 2018-12-05 DIAGNOSIS — F0150 Vascular dementia without behavioral disturbance: Secondary | ICD-10-CM

## 2018-12-05 DIAGNOSIS — R634 Abnormal weight loss: Secondary | ICD-10-CM

## 2018-12-05 DIAGNOSIS — K449 Diaphragmatic hernia without obstruction or gangrene: Secondary | ICD-10-CM

## 2018-12-05 NOTE — Progress Notes (Signed)
[facility]   Place of Service:  SNF (31) Provider:   Peggye Leyhristy Annakate Soulier, ANP [facility] [phone]   Kermit Baloeed, Tiffany L, DO  Patient Care Team: Kermit Baloeed, Tiffany L, DO as PCP - General (Geriatric Medicine) Marinus Mawaylor, Gregg W, MD as PCP - Cardiology (Cardiology) Fletcher AnonWert, Oluwatosin Bracy, NP as Nurse Practitioner (Nurse Practitioner)  Extended Emergency Contact Information Primary Emergency Contact: Teressa LowerBeavers,Maryjane Address: [address]          [place], [place] [address] [place] Home Phone: [phone] Relation: Other Secondary Emergency Contact: Samuel BoucheLambert,Beverly  [place] Home Phone: [phone] Relation: Daughter  Code Status:  DNR Goals of care: Advanced Directive information Advanced Directives 07/15/2018  Does Patient Have a Medical Advance Directive? Yes  Type of Estate agentAdvance Directive Healthcare Power of [place];Out of facility DNR (pink MOST or yellow form)  Does patient want to make changes to medical advance directive? No - Patient declined  Copy of Healthcare Power of Attorney in Chart? Yes - validated most recent copy scanned in chart (See row information)  Pre-existing out of facility DNR order (yellow form or pink MOST form) Yellow form placed in chart (order not valid for inpatient use);Pink MOST form placed in chart (order not valid for inpatient use)     Chief Complaint  Patient presents with  . Medical Management of Chronic Issues    HPI:  Pt is a 85 y.o. female seen today for medical management of chronic diseases.   She continues to lose weight but there are no reports of her not eating well. Intakes 25-75% most meals.  Wt Readings from Last 3 Encounters:  12/08/18 157 lb 3.2 oz (71.3 kg)  10/14/18 164 lb (74.4 kg)  09/11/18 164 lb 8 oz (74.6 kg)   She occasionally is anxious. Staff deny crying or depressed mood. Sleeps well.   Dementia: no issues with behaviors or progressive  confusion. Continues to ambulate short distances. Needs assistance with other ADLs.  No sob, edema, cp, or weight gain  No joint swelling or pain. No fever.   Denies difficulty swallowing her food, pain with swallowing, indigestion, etc.   Past Medical History:  Diagnosis Date  . CAD (coronary artery disease)   . Chronic low back pain   . Contusion of foot, right 05/15/12  . Dementia (HCC) 10/16/2016   06/25/17 MMSE 18/30 passed clock  . Diverticulitis   . Dyslipidemia   . Dysphagia 12/11/2016  . H/O: hysterectomy   . HTN (hypertension)   . Memory disturbance 05/2012   MMSE 26/30, intact Clock test  . Nonischemic cardiomyopathy (HCC)   . Osteoporosis   . Pulmonary embolism (HCC)   . Right knee pain 05/15/12  . Spinal stenosis   . Ulcer disease   . Weight loss    Past Surgical History:  Procedure Laterality Date  . APPENDECTOMY  2010  . BACK SURGERY    . BIV ICD GENERTAOR CHANGE OUT N/A 05/24/2011   Procedure: BIV ICD GENERTAOR CHANGE OUT;  Surgeon: Marinus MawGregg W Taylor, MD;  [facility];  Service: Cardiovascular;  Laterality: N/A;  . CATARACT EXTRACTION    . COLON RESECTION  2005  . CORONARY ANGIOPLASTY WITH STENT PLACEMENT  01/2003  . HEMICOLECTOMY    . HIP ARTHROPLASTY Left 04/05/2014   Procedure: ARTHROPLASTY BIPOLAR HIP;  Surgeon: Shelda PalMatthew D Olin, MD;  Location: [facility] ORS;  Service: Orthopedics;  Laterality: Left;  . left shoulder replacement  1991  Dr. Collie Siad  . ORIF HIP FRACTURE Left 09/30/2016   Procedure: OPEN REDUCTION INTERNAL FIXATION PERIPROSTHETIC HIP FRACTURE;  Surgeon: Gaynelle Arabian, MD;  Location: [facility] ORS;  Service: Orthopedics;  Laterality: Left;  . right shoulder repair  1985   Dr. Collie Siad  . rotator cuff debridement  1999   Dr. Maureen Ralphs    Allergies  Allergen Reactions  . Arthrotec [Diclofenac-Misoprostol]   . Ibuprofen   . Lactose Intolerance (Gi)   . Milk-Related Compounds Nausea And Vomiting    Cheese  . Other     Grass and ragweed   . Oxycontin  [Oxycodone Hcl]   . Pollen Extract     Outpatient Encounter Medications as of 12/05/2018  Medication Sig  . acetaminophen (TYLENOL) 325 MG tablet Take 650 mg by mouth 3 (three) times daily.   Marland Kitchen albuterol (PROVENTIL HFA;VENTOLIN HFA) 108 (90 Base) MCG/ACT inhaler Inhale 2 puffs into the lungs every 6 (six) hours as needed for wheezing or shortness of breath.  . allopurinol (ZYLOPRIM) 100 MG tablet Take 100 mg by mouth daily.  Marland Kitchen aspirin EC 81 MG tablet Take 81 mg by mouth daily.  . Carboxymethylcellul-Glycerin (OPTIVE) 0.5-0.9 % SOLN Apply 2 drops to eye 2 (two) times daily.  . carvedilol (COREG) 3.125 MG tablet Take 1.5625 mg by mouth 2 (two) times daily with a meal.   . cyanocobalamin 1000 MCG tablet Take 1,000 mcg by mouth daily.   Marland Kitchen docusate (COLACE) 50 MG/5ML liquid Take 100 mg by mouth daily.  . furosemide (LASIX) 20 MG tablet Take 20 mg by mouth every other day. Alternate between 40mg  every other day  . furosemide (LASIX) 40 MG tablet Take 40 mg by mouth every other day. Alternate with 20mg  lasix  . lactose free nutrition (BOOST PLUS) LIQD Take 237 mLs by mouth 2 (two) times daily between meals.   Marland Kitchen losartan (COZAAR) 25 MG tablet Take 0.5 tablets (12.5 mg total) by mouth at bedtime.  Marland Kitchen omeprazole (PRILOSEC OTC) 20 MG tablet Take 20 mg by mouth 2 (two) times daily.  . polyethylene glycol (MIRALAX / GLYCOLAX) packet Take 17 g by mouth daily. Hold for loose stools  . potassium chloride (KLOR-CON) 20 MEQ packet Take 20 mEq by mouth every other day.  . Zinc Oxide (DESITIN) 13 % CREA Apply topically as needed (after each bathroom use).   No facility-administered encounter medications on file as of 12/05/2018.     Review of Systems  Constitutional: Positive for unexpected weight change. Negative for activity change, appetite change, chills, diaphoresis, fatigue and fever.  HENT: Negative for congestion.   Respiratory: Negative for cough, shortness of breath and wheezing.   Cardiovascular:  Positive for leg swelling. Negative for chest pain and palpitations.  Gastrointestinal: Negative for abdominal distention, abdominal pain, constipation and diarrhea.  Genitourinary: Negative for difficulty urinating and dysuria.  Musculoskeletal: Positive for gait problem. Negative for arthralgias, back pain, joint swelling and myalgias.  Neurological: Negative for dizziness, tremors, seizures, syncope, facial asymmetry, speech difficulty, weakness, light-headedness, numbness and headaches.  Psychiatric/Behavioral: Positive for confusion. Negative for agitation and behavioral problems.    Immunization History  Administered Date(s) Administered  . Influenza Inj Mdck Quad Pf 01/05/2016  . Influenza,inj,Quad PF,6+ Mos 01/07/2018  . Influenza-Unspecified 12/22/2014, 01/10/2017  . Pneumococcal Conjugate-13 12/14/2013  . Pneumococcal Polysaccharide-23 03/29/2016  . Tdap 03/29/2016  . Zoster 11/03/2007  . Zoster Recombinat (Shingrix) 04/01/2017, 06/27/2017   Pertinent  Health Maintenance Due  Topic Date Due  . PNA vac Low Risk  Adult  Completed  . DEXA SCAN  Discontinued   Fall Risk  04/22/2018  Falls in the past year? 0  Number falls in past yr: 0  Injury with Fall? 0  Risk for fall due to : Impaired mobility;Medication side effect;Mental status change;Impaired balance/gait  Follow up Falls evaluation completed;Education provided  Comment seen by therapy for all of this   Functional Status Survey:    Vitals:   12/08/18 1631  Weight: 157 lb 3.2 oz (71.3 kg)   Body mass index is 26.16 kg/m. Physical Exam Vitals signs and nursing note reviewed.  Constitutional:      General: She is not in acute distress.    Appearance: She is not diaphoretic.  HENT:     Head: Normocephalic and atraumatic.     Mouth/Throat:     Mouth: Mucous membranes are moist.     Pharynx: Oropharynx is clear. No oropharyngeal exudate.  Neck:     Vascular: No JVD.  Cardiovascular:     Rate and Rhythm:  Normal rate and regular rhythm.     Heart sounds: No murmur.  Pulmonary:     Effort: Pulmonary effort is normal. No respiratory distress.     Breath sounds: Rales present. No wheezing.  Abdominal:     General: Bowel sounds are normal. There is no distension.     Palpations: Abdomen is soft.  Musculoskeletal:     Right lower leg: No edema.     Left lower leg: No edema.  Skin:    General: Skin is warm and dry.  Neurological:     General: No focal deficit present.     Mental Status: She is alert. Mental status is at baseline.  Psychiatric:        Mood and Affect: Mood normal.     Labs reviewed: Recent Labs    02/27/18  NA 142  K 4.8  BUN 27*  CREATININE 0.8   No results for input(s): AST, ALT, ALKPHOS, BILITOT, PROT, ALBUMIN in the last 8760 hours. Recent Labs    02/27/18  WBC 7.0  HGB 11.3*  HCT 21*  PLT 199   Lab Results  Component Value Date   TSH 4.58 10/10/2016   No results found for: HGBA1C Lab Results  Component Value Date   CHOL 159 02/09/2017   HDL 49 02/09/2017   LDLCALC 96 02/09/2017   TRIG 69 02/09/2017   CHOLHDL 3.2 02/09/2017    Significant Diagnostic Results in last 30 days:  No results found.  Assessment/Plan 1. Weight loss Check BMP and TSH  Staff to collaborate with nutritionist.  Seems to be in her usual state of health but she does have advanced dementia and a hx of dysphagia which could contribute.Dysphoric mood due to the pandemic may also play a role. No aggressive testing due to her age and dementia.   2. Chronic systolic heart failure (HCC) Compensated, continue alternating dosage of lasix 40 mg and 20 mg Could consider dose reduction of signs of dehydration on labs.   3. Hiatal hernia No issues, continue prilosec 20 mg bid (hx of chest pain so would not discontiue)  4. Idiopathic chronic gout of multiple sites without tophus Continue allopurinol 100 mg qd   5. CKD (chronic kidney disease) stage 3, GFR 30-59 ml/min (HCC)  Continue to periodically monitor BMP and avoid nephrotoxic agents  6. Vascular dementia without behavioral disturbance (HCC) Advanced in nature but fairly stable cognitive and functional status in the past year.  Family/ staff Communication: discussed with nurse  Labs/tests ordered:   BMP TSH

## 2018-12-08 ENCOUNTER — Encounter: Payer: Self-pay | Admitting: Adult Health

## 2018-12-08 DIAGNOSIS — R634 Abnormal weight loss: Secondary | ICD-10-CM | POA: Diagnosis not present

## 2018-12-08 DIAGNOSIS — E039 Hypothyroidism, unspecified: Secondary | ICD-10-CM | POA: Diagnosis not present

## 2018-12-08 DIAGNOSIS — D649 Anemia, unspecified: Secondary | ICD-10-CM | POA: Diagnosis not present

## 2018-12-08 LAB — TSH: TSH: 3.77 (ref 0.41–5.90)

## 2018-12-08 LAB — BASIC METABOLIC PANEL
BUN: 22 — AB (ref 4–21)
Creatinine: 0.8 (ref 0.5–1.1)
Glucose: 86
Potassium: 4.7 (ref 3.4–5.3)
Sodium: 140 (ref 137–147)

## 2018-12-17 DIAGNOSIS — Z9189 Other specified personal risk factors, not elsewhere classified: Secondary | ICD-10-CM | POA: Diagnosis not present

## 2018-12-17 LAB — NOVEL CORONAVIRUS, NAA: SARS-CoV-2, NAA: NOT DETECTED

## 2018-12-22 DIAGNOSIS — Z20828 Contact with and (suspected) exposure to other viral communicable diseases: Secondary | ICD-10-CM | POA: Diagnosis not present

## 2018-12-25 ENCOUNTER — Other Ambulatory Visit: Payer: Self-pay | Admitting: *Deleted

## 2018-12-25 DIAGNOSIS — Z20828 Contact with and (suspected) exposure to other viral communicable diseases: Secondary | ICD-10-CM | POA: Diagnosis not present

## 2018-12-31 DIAGNOSIS — Z9189 Other specified personal risk factors, not elsewhere classified: Secondary | ICD-10-CM | POA: Diagnosis not present

## 2019-01-05 DIAGNOSIS — Z9189 Other specified personal risk factors, not elsewhere classified: Secondary | ICD-10-CM | POA: Diagnosis not present

## 2019-01-12 DIAGNOSIS — Z9189 Other specified personal risk factors, not elsewhere classified: Secondary | ICD-10-CM | POA: Diagnosis not present

## 2019-01-13 ENCOUNTER — Non-Acute Institutional Stay (SKILLED_NURSING_FACILITY): Payer: Medicare Other | Admitting: Internal Medicine

## 2019-01-13 ENCOUNTER — Encounter: Payer: Self-pay | Admitting: Internal Medicine

## 2019-01-13 DIAGNOSIS — Z66 Do not resuscitate: Secondary | ICD-10-CM

## 2019-01-13 DIAGNOSIS — I5022 Chronic systolic (congestive) heart failure: Secondary | ICD-10-CM

## 2019-01-13 DIAGNOSIS — I2510 Atherosclerotic heart disease of native coronary artery without angina pectoris: Secondary | ICD-10-CM | POA: Diagnosis not present

## 2019-01-13 DIAGNOSIS — F0150 Vascular dementia without behavioral disturbance: Secondary | ICD-10-CM | POA: Diagnosis not present

## 2019-01-13 DIAGNOSIS — K5901 Slow transit constipation: Secondary | ICD-10-CM

## 2019-01-13 DIAGNOSIS — N1831 Chronic kidney disease, stage 3a: Secondary | ICD-10-CM

## 2019-01-13 NOTE — Progress Notes (Signed)
Patient ID: [name], female   DOB: 10-25-1920, 85 y.o.   MRN: [id number]  Location:  [facility] Room Number: 111-A Place of Service:  SNF [phone]) Provider:   [name], DO  Patient Care Team: [name], DO as PCP - General (Geriatric Medicine) [name], MD as PCP - Cardiology (Cardiology) [name], NP as Nurse Practitioner (Nurse Practitioner)  Extended Emergency Contact Information Primary Emergency Contact: [name] Address: [address]          [place], [place] [address] [name] Home Phone: [phone] Relation: Other Secondary Emergency Contact: [name] [place] Home Phone: [phone] Relation: Daughter  Code Status: DNR  Goals of care: Advanced Directive information Advanced Directives 01/13/2019  Does Patient Have a Medical Advance Directive? Yes  Type of Advance Directive Out of facility DNR (pink MOST or yellow form)  Does patient want to make changes to medical advance directive? No - Patient declined  Copy of Healthcare Power of Attorney in Chart? -  Pre-existing out of facility DNR order (yellow form or pink MOST form) Pink MOST form placed in chart (order not valid for inpatient use)     Chief Complaint  Patient presents with  . Medical Management of Chronic Issues    Routine follow-up at [facility]     HPI:  Pt is a 85 y.o. female seen today for medical management of chronic diseases.    Caregiver with her and nursing staff had no new concerns about her.  She is doing well.  She was in good spirits today--no complaints of her abdominal pain she's reported at times in the past.  She continues to receive assistance with her ADLs and living in SNF.  She has a [facility] caregiver, as well.    She herself has no complaints.  Weight is up a couple of lbs.  Had trended down initially during the pandemic.   Cognition is declining slowly.   MMSE - Mini Mental State Exam 04/16/2017 06/04/2012  Orientation to time 0 3  Orientation to Place 4 5  Registration 3 3  Attention/ Calculation 2 5  Attention/Calculation-comments - spelled WORLD backwards  Recall 0 1  Language- name 2 objects 2 2  Language- repeat 1 1  Language- follow 3 step command 3 3  Language- read & follow direction 1 1  Write a sentence 1 1  Copy design 1 1  Total score 18 26     Past Medical History:  Diagnosis Date  . CAD (coronary artery disease)   . Chronic low back pain   . Contusion of foot, right 05/15/12  . Dementia (HCC) 10/16/2016   06/25/17 MMSE 18/30 passed clock  . Diverticulitis   . Dyslipidemia   . Dysphagia 12/11/2016  . H/O: hysterectomy   . HTN (hypertension)   . Memory disturbance 05/2012   MMSE 26/30, intact Clock test  . Nonischemic cardiomyopathy (HCC)   . Osteoporosis   . Pulmonary embolism (HCC)   . Right knee pain 05/15/12  . Spinal stenosis   . Ulcer disease   . Weight loss    Past Surgical History:  Procedure Laterality Date  . APPENDECTOMY  2010  . BACK SURGERY    . BIV ICD GENERTAOR CHANGE OUT N/A 05/24/2011   Procedure: BIV ICD GENERTAOR CHANGE OUT;  Surgeon: [name], MD;  Location: [facility] CATH LAB;  Service: Cardiovascular;  Laterality: N/A;  .  CATARACT EXTRACTION    . COLON RESECTION  2005  . CORONARY ANGIOPLASTY WITH STENT PLACEMENT  01/2003  . HEMICOLECTOMY    . HIP ARTHROPLASTY Left 04/05/2014   Procedure: ARTHROPLASTY BIPOLAR HIP;  Surgeon: Shelda Pal, MD;  Location: [facility] ORS;  Service: Orthopedics;  Laterality: Left;  . left shoulder replacement  1991   Dr. [name]  . ORIF HIP FRACTURE Left 09/30/2016   Procedure: OPEN REDUCTION INTERNAL FIXATION PERIPROSTHETIC HIP FRACTURE;  Surgeon: Ollen Gross, MD;  Location: [facility]S;  Service: Orthopedics;  Laterality: Left;  . right shoulder repair  1985   Dr. [name]  . rotator cuff debridement  1999   Dr. [name]    Allergies   Allergen Reactions  . Arthrotec [Diclofenac-Misoprostol]   . Ibuprofen   . Lactose Intolerance (Gi)   . Milk-Related Compounds Nausea And Vomiting    Cheese  . Other     Grass and ragweed   . Oxycontin [Oxycodone Hcl]   . Pollen Extract     Outpatient Encounter Medications as of 01/13/2019  Medication Sig  . acetaminophen (TYLENOL) 325 MG tablet Take 650 mg by mouth 3 (three) times daily.   Marland Kitchen albuterol (PROVENTIL HFA;VENTOLIN HFA) 108 (90 Base) MCG/ACT inhaler Inhale 2 puffs into the lungs every 6 (six) hours as needed for wheezing or shortness of breath.  . allopurinol (ZYLOPRIM) 100 MG tablet Take 100 mg by mouth daily.  Marland Kitchen aspirin EC 81 MG tablet Take 81 mg by mouth daily.  . Carboxymethylcellul-Glycerin (OPTIVE) 0.5-0.9 % SOLN Apply 2 drops to eye 2 (two) times daily.  . carvedilol (COREG) 3.125 MG tablet Take 1.5625 mg by mouth 2 (two) times daily with a meal.   . cyanocobalamin 1000 MCG tablet Take 1,000 mcg by mouth daily.   Tery Sanfilippo Sodium 100 MG capsule Take 100 mg by mouth daily. May crush  . furosemide (LASIX) 20 MG tablet Take 20 mg by mouth every other day. Alternate between 40mg  every other day  . furosemide (LASIX) 40 MG tablet Take 40 mg by mouth every other day. Alternate with 20mg  lasix  . losartan (COZAAR) 25 MG tablet Take 0.5 tablets (12.5 mg total) by mouth at bedtime.  . Nutritional Supplements (FEEDING SUPPLEMENT, BOOST BREEZE,) LIQD Take 1 Container by mouth 2 (two) times daily.  Marland Kitchen omeprazole (PRILOSEC OTC) 20 MG tablet Take 20 mg by mouth 2 (two) times daily.  . polyethylene glycol (MIRALAX / GLYCOLAX) packet Take 17 g by mouth daily. Hold for loose stools  . potassium chloride (KLOR-CON) 20 MEQ packet Take 20 mEq by mouth every other day.  . Zinc Oxide (DESITIN) 13 % CREA Apply topically as needed (after each bathroom use).  . [DISCONTINUED] docusate (COLACE) 50 MG/5ML liquid Take 100 mg by mouth daily.  . [DISCONTINUED] lactose free nutrition (BOOST PLUS)  LIQD Take 237 mLs by mouth 2 (two) times daily between meals.    No facility-administered encounter medications on file as of 01/13/2019.     Review of Systems  Constitutional: Negative for activity change, appetite change, chills, fever and unexpected weight change.  HENT: Negative for congestion and trouble swallowing.   Eyes: Negative for visual disturbance.  Respiratory: Negative for chest tightness and shortness of breath.   Cardiovascular: Negative for chest pain, palpitations and leg swelling.  Gastrointestinal: Negative for abdominal pain, constipation, diarrhea, nausea and vomiting.  Genitourinary: Negative for dysuria.  Musculoskeletal: Positive for gait problem. Negative for arthralgias.  Skin: Negative for color change.  Neurological:  Negative for dizziness and weakness.  Hematological: Does not bruise/bleed easily.  Psychiatric/Behavioral: Positive for confusion. Negative for agitation and sleep disturbance.    Immunization History  Administered Date(s) Administered  . Influenza Inj Mdck Quad Pf 01/05/2016  . Influenza, High Dose Seasonal PF 01/01/2019  . Influenza,inj,Quad PF,6+ Mos 01/07/2018  . Influenza-Unspecified 12/22/2014, 01/10/2017  . Pneumococcal Conjugate-13 12/14/2013  . Pneumococcal Polysaccharide-23 03/29/2016  . Tdap 03/29/2016  . Zoster 11/03/2007  . Zoster Recombinat (Shingrix) 04/01/2017, 06/27/2017   Pertinent  Health Maintenance Due  Topic Date Due  . PNA vac Low Risk Adult  Completed  . DEXA SCAN  Discontinued   Fall Risk  04/22/2018  Falls in the past year? 0  Number falls in past yr: 0  Injury with Fall? 0  Risk for fall due to : Impaired mobility;Medication side effect;Mental status change;Impaired balance/gait  Follow up Falls evaluation completed;Education provided  Comment seen by therapy for all of this   Functional Status Survey:    Vitals:   01/13/19 1430  BP: (!) 100/58  Pulse: 71  Resp: 18  Temp: (!) 96.2 F (35.7 C)   SpO2: 97%  Weight: 159 lb 12.8 oz (72.5 kg)  Height: 5\' 5"  (1.651 m)   Body mass index is 26.59 kg/m. Physical Exam Vitals signs reviewed.  Constitutional:      Appearance: Normal appearance.  HENT:     Head: Normocephalic and atraumatic.  Cardiovascular:     Rate and Rhythm: Normal rate and regular rhythm.     Pulses: Normal pulses.     Heart sounds: Normal heart sounds.  Pulmonary:     Effort: Pulmonary effort is normal.     Breath sounds: Normal breath sounds. No wheezing, rhonchi or rales.  Musculoskeletal: Normal range of motion.     Right lower leg: Edema present.     Left lower leg: Edema present.     Comments: Nonpitting edema  Skin:    General: Skin is warm and dry.  Neurological:     General: No focal deficit present.     Mental Status: She is alert. Mental status is at baseline.  Psychiatric:        Mood and Affect: Mood normal.        Behavior: Behavior normal.     Labs reviewed: Recent Labs    02/27/18 12/08/18  NA 142 140  K 4.8 4.7  BUN 27* 22*  CREATININE 0.8 0.8   No results for input(s): AST, ALT, ALKPHOS, BILITOT, PROT, ALBUMIN in the last 8760 hours. Recent Labs    02/27/18  WBC 7.0  HGB 11.3*  HCT 21*  PLT 199   Lab Results  Component Value Date   TSH 3.77 12/08/2018   No results found for: HGBA1C Lab Results  Component Value Date   CHOL 159 02/09/2017   HDL 49 02/09/2017   LDLCALC 96 02/09/2017   TRIG 69 02/09/2017   CHOLHDL 3.2 02/09/2017    Assessment/Plan 1. Vascular dementia without behavioral disturbance ([place]) -progressing gradually, very pleasant w/o behaviors, cont additional home care and ADL support in SNF  2. Stage 3a chronic kidney disease Avoid nephrotoxic agents like nsaids, dose adjust renally excreted meds, hydrate.  3. Coronary artery disease involving native coronary artery of native heart without angina pectoris -no recent chest pain concerns  -cont current arb  4. Chronic systolic heart failure (HCC)  -cont lasix regimen and potassium  5. Slow transit constipation -bowels moving, cont colace  6. DNR (do  not resuscitate) - DNR (Do Not Resuscitate)  Family/ staff Communication: discussed with caregiver and snf nurse  Labs/tests ordered:  No new  Emalina Dubreuil L. Chastin Riesgo, D.O. Geriatrics [facility] [address] [place], [place] [address] Cell Phone (Mon-Fri 8am-5pm):  [phone] On Call:  [phone] & follow prompts after 5pm & weekends Office Phone:  [phone] Office Fax:  [phone]

## 2019-01-28 DIAGNOSIS — Z9189 Other specified personal risk factors, not elsewhere classified: Secondary | ICD-10-CM | POA: Diagnosis not present

## 2019-01-28 DIAGNOSIS — Z20828 Contact with and (suspected) exposure to other viral communicable diseases: Secondary | ICD-10-CM | POA: Diagnosis not present

## 2019-02-03 DIAGNOSIS — Z9189 Other specified personal risk factors, not elsewhere classified: Secondary | ICD-10-CM | POA: Diagnosis not present

## 2019-02-06 ENCOUNTER — Non-Acute Institutional Stay (SKILLED_NURSING_FACILITY): Payer: Medicare Other | Admitting: Adult Health

## 2019-02-06 ENCOUNTER — Ambulatory Visit (INDEPENDENT_AMBULATORY_CARE_PROVIDER_SITE_OTHER): Payer: Medicare Other | Admitting: *Deleted

## 2019-02-06 DIAGNOSIS — I2510 Atherosclerotic heart disease of native coronary artery without angina pectoris: Secondary | ICD-10-CM

## 2019-02-06 DIAGNOSIS — I495 Sick sinus syndrome: Secondary | ICD-10-CM

## 2019-02-06 DIAGNOSIS — F0390 Unspecified dementia without behavioral disturbance: Secondary | ICD-10-CM

## 2019-02-06 DIAGNOSIS — I5022 Chronic systolic (congestive) heart failure: Secondary | ICD-10-CM | POA: Diagnosis not present

## 2019-02-06 DIAGNOSIS — M1A09X0 Idiopathic chronic gout, multiple sites, without tophus (tophi): Secondary | ICD-10-CM

## 2019-02-06 LAB — CUP PACEART REMOTE DEVICE CHECK
Battery Impedance: 1618 Ohm
Battery Remaining Longevity: 36 mo
Battery Voltage: 2.76 V
Brady Statistic AP VP Percent: 53 %
Brady Statistic AP VS Percent: 0 %
Brady Statistic AS VP Percent: 47 %
Brady Statistic AS VS Percent: 0 %
Date Time Interrogation Session: 20201120103355
Implantable Lead Implant Date: 20071005
Implantable Lead Implant Date: 20071005
Implantable Lead Location: 753858
Implantable Lead Location: 753859
Implantable Lead Model: 4194
Implantable Lead Model: 5076
Implantable Pulse Generator Implant Date: 20130307
Lead Channel Impedance Value: 426 Ohm
Lead Channel Impedance Value: 850 Ohm
Lead Channel Pacing Threshold Amplitude: 0.75 V
Lead Channel Pacing Threshold Amplitude: 0.75 V
Lead Channel Pacing Threshold Pulse Width: 0.4 ms
Lead Channel Pacing Threshold Pulse Width: 0.4 ms
Lead Channel Setting Pacing Amplitude: 2 V
Lead Channel Setting Pacing Amplitude: 2.5 V
Lead Channel Setting Pacing Pulse Width: 0.4 ms
Lead Channel Setting Sensing Sensitivity: 4 mV

## 2019-02-09 ENCOUNTER — Encounter: Payer: Self-pay | Admitting: Adult Health

## 2019-02-09 NOTE — Progress Notes (Signed)
Location:  Medical illustrator of Service:  SNF (31) Provider:   [name], ANP [facility] [phone]   [name], DO  Patient Care Team: [name], DO as PCP - General (Geriatric Medicine) [name], MD as PCP - Cardiology (Cardiology) [name], NP as Nurse Practitioner (Nurse Practitioner)  Extended Emergency Contact Information Primary Emergency Contact: [name] Address: [address]          [place], [place] [address] [name] Home Phone: [phone] Relation: Other Secondary Emergency Contact: [name] [place] Home Phone: [phone] Relation: Daughter  Code Status:  DNR Goals of care: Advanced Directive information Advanced Directives 01/13/2019  Does Patient Have a Medical Advance Directive? Yes  Type of Advance Directive Out of facility DNR (pink MOST or yellow form);Healthcare Power of [place];Living will  Does patient want to make changes to medical advance directive? No - Patient declined  Copy of Healthcare Power of Attorney in Chart? Yes - validated most recent copy scanned in chart (See row information)  Pre-existing out of facility DNR order (yellow form or pink MOST form) Pink MOST form placed in chart (order not valid for inpatient use);Yellow form placed in chart (order not valid for inpatient use)     Chief Complaint  Patient presents with  . Medical Management of Chronic Issues    HPI:  Pt is a 85 y.o. female seen today for medical management of chronic diseases.   Dementia: possibly a mixed picture. CT in 2018 showed atrophy with chronic small vessel disease.  She has been fairly stable in terms of cognition over the past year. She is alert and oriented to herself but easily confused about the details of her care, situation, date, and time. No issues with behaviors. Her weight is now stable at 160 after a period of loss. Her  caretaker reports that she eats a large breakfast and then a small lunch and very little dinner.  Gout: no reports of joint pain or swelling Pacemaker: due to SSS, no issues with bradycardia, CHF etc CHF: no sob, doe, increased edema, weight gain etc.   Past Medical History:  Diagnosis Date  . CAD (coronary artery disease)   . Chronic low back pain   . Contusion of foot, right 05/15/12  . Dementia (HCC) 10/16/2016   06/25/17 MMSE 18/30 passed clock  . Diverticulitis   . Dyslipidemia   . Dysphagia 12/11/2016  . H/O: hysterectomy   . HTN (hypertension)   . Memory disturbance 05/2012   MMSE 26/30, intact Clock test  . Nonischemic cardiomyopathy (HCC)   . Osteoporosis   . Pulmonary embolism (HCC)   . Right knee pain 05/15/12  . Spinal stenosis   . Ulcer disease   . Weight loss    Past Surgical History:  Procedure Laterality Date  . APPENDECTOMY  2010  . BACK SURGERY    . BIV ICD GENERTAOR CHANGE OUT N/A 05/24/2011   Procedure: BIV ICD GENERTAOR CHANGE OUT;  Surgeon: [name], MD;  Location: [facility] CATH LAB;  Service: Cardiovascular;  Laterality: N/A;  . CATARACT EXTRACTION    . COLON RESECTION  2005  . CORONARY ANGIOPLASTY WITH STENT PLACEMENT  01/2003  . HEMICOLECTOMY    . HIP ARTHROPLASTY Left 04/05/2014   Procedure: ARTHROPLASTY BIPOLAR HIP;  Surgeon: Shelda Pal, MD;  Location: [facility] ORS;  Service: Orthopedics;  Laterality: Left;  . left shoulder replacement  1991   Dr. Collie Siad  . ORIF HIP FRACTURE Left 09/30/2016   Procedure: OPEN REDUCTION INTERNAL FIXATION PERIPROSTHETIC HIP FRACTURE;  Surgeon: Gaynelle Arabian, MD;  Location: [facility] ORS;  Service: Orthopedics;  Laterality: Left;  . right shoulder repair  1985   Dr. Collie Siad  . rotator cuff debridement  1999   Dr. Maureen Ralphs    Allergies  Allergen Reactions  . Arthrotec [Diclofenac-Misoprostol]   . Ibuprofen   . Lactose Intolerance (Gi)   . Milk-Related Compounds Nausea And Vomiting    Cheese  . Other     Grass and ragweed   .  Oxycontin [Oxycodone Hcl]   . Pollen Extract     Outpatient Encounter Medications as of 02/06/2019  Medication Sig  . acetaminophen (TYLENOL) 325 MG tablet Take 650 mg by mouth 3 (three) times daily.   Marland Kitchen albuterol (PROVENTIL HFA;VENTOLIN HFA) 108 (90 Base) MCG/ACT inhaler Inhale 2 puffs into the lungs every 6 (six) hours as needed for wheezing or shortness of breath.  . allopurinol (ZYLOPRIM) 100 MG tablet Take 100 mg by mouth daily.  Marland Kitchen aspirin EC 81 MG tablet Take 81 mg by mouth daily.  . Carboxymethylcellul-Glycerin (OPTIVE) 0.5-0.9 % SOLN Apply 2 drops to eye 2 (two) times daily.  . carvedilol (COREG) 3.125 MG tablet Take 1.5625 mg by mouth 2 (two) times daily with a meal.   . cyanocobalamin 1000 MCG tablet Take 1,000 mcg by mouth daily.   Mariane Baumgarten Sodium 100 MG capsule Take 100 mg by mouth daily. May crush  . furosemide (LASIX) 20 MG tablet Take 20 mg by mouth every other day. Alternate between 40mg  every other day  . furosemide (LASIX) 40 MG tablet Take 40 mg by mouth every other day. Alternate with 20mg  lasix  . losartan (COZAAR) 25 MG tablet Take 0.5 tablets (12.5 mg total) by mouth at bedtime.  . Nutritional Supplements (FEEDING SUPPLEMENT, BOOST BREEZE,) LIQD Take 1 Container by mouth 2 (two) times daily.  Marland Kitchen omeprazole (PRILOSEC OTC) 20 MG tablet Take 20 mg by mouth 2 (two) times daily.  . polyethylene glycol (MIRALAX / GLYCOLAX) packet Take 17 g by mouth daily. Hold for loose stools  . potassium chloride (KLOR-CON) 20 MEQ packet Take 20 mEq by mouth every other day.  . Zinc Oxide (DESITIN) 13 % CREA Apply topically as needed (after each bathroom use).   No facility-administered encounter medications on file as of 02/06/2019.     Review of Systems  Constitutional: Negative for activity change, appetite change, chills, diaphoresis, fatigue, fever and unexpected weight change.  HENT: Negative for congestion.   Respiratory: Negative for cough, shortness of breath and wheezing.    Cardiovascular: Negative for chest pain, palpitations and leg swelling.  Gastrointestinal: Negative for abdominal distention, abdominal pain, constipation and diarrhea.  Genitourinary: Negative for difficulty urinating and dysuria.  Musculoskeletal: Positive for gait problem. Negative for arthralgias, back pain, joint swelling and myalgias.  Neurological: Negative for dizziness, tremors, seizures, syncope, facial asymmetry, speech difficulty, weakness, light-headedness, numbness and headaches.  Psychiatric/Behavioral: Positive for confusion. Negative for agitation and behavioral problems.    Immunization History  Administered Date(s) Administered  . Influenza Inj Mdck Quad Pf 01/05/2016  . Influenza, High Dose Seasonal PF 01/01/2019  . Influenza,inj,Quad PF,6+ Mos 01/07/2018  . Influenza-Unspecified 12/22/2014, 01/10/2017  . Pneumococcal Conjugate-13 12/14/2013  . Pneumococcal Polysaccharide-23 03/29/2016  . Tdap 03/29/2016  . Zoster 11/03/2007  . Zoster Recombinat (Shingrix) 04/01/2017, 06/27/2017   Pertinent  Health Maintenance Due  Topic Date  Due  . PNA vac Low Risk Adult  Completed  . DEXA SCAN  Discontinued   Fall Risk  04/22/2018  Falls in the past year? 0  Number falls in past yr: 0  Injury with Fall? 0  Risk for fall due to : Impaired mobility;Medication side effect;Mental status change;Impaired balance/gait  Follow up Falls evaluation completed;Education provided  Comment seen by therapy for all of this   Functional Status Survey:    Vitals:   02/09/19 0950  Weight: 160 lb (72.6 kg)   Body mass index is 26.63 kg/m. Physical Exam Vitals signs and nursing note reviewed.  Constitutional:      General: She is not in acute distress.    Appearance: She is not diaphoretic.  HENT:     Head: Normocephalic and atraumatic.  Neck:     Musculoskeletal: No neck rigidity.     Vascular: No JVD.  Cardiovascular:     Rate and Rhythm: Normal rate and regular rhythm.      Heart sounds: No murmur.  Pulmonary:     Effort: Pulmonary effort is normal. No respiratory distress.     Breath sounds: Rales present. No wheezing.  Abdominal:     General: Bowel sounds are normal. There is no distension.     Palpations: Abdomen is soft.     Tenderness: There is no abdominal tenderness.  Lymphadenopathy:     Cervical: No cervical adenopathy.  Skin:    General: Skin is warm and dry.  Neurological:     General: No focal deficit present.     Mental Status: She is alert. Mental status is at baseline.  Psychiatric:        Mood and Affect: Mood normal.     Labs reviewed: Recent Labs    02/27/18 12/08/18  NA 142 140  K 4.8 4.7  BUN 27* 22*  CREATININE 0.8 0.8   No results for input(s): AST, ALT, ALKPHOS, BILITOT, PROT, ALBUMIN in the last 8760 hours. Recent Labs    02/27/18  WBC 7.0  HGB 11.3*  HCT 21*  PLT 199   Lab Results  Component Value Date   TSH 3.77 12/08/2018   No results found for: HGBA1C Lab Results  Component Value Date   CHOL 159 02/09/2017   HDL 49 02/09/2017   LDLCALC 96 02/09/2017   TRIG 69 02/09/2017   CHOLHDL 3.2 02/09/2017    Significant Diagnostic Results in last 30 days:  No results found.  Assessment/Plan 1. Sick sinus syndrome [facility]) Has pacemaker Due to for remote pacemaker transmission  Nurse to implement.   2. Chronic systolic heart failure (HCC) Compensated.  Continue alternating dose of lasix and weight monitoring.   3. Coronary artery disease involving native coronary artery of native heart without angina pectoris Hx of stent. Continues on baby aspirin. No new events.   4. Dementia without behavioral disturbance, unspecified dementia type (HCC) Remains pleasant and able to ambulate short distances. Progressive decline in cognition and physical function c/w the disease. Continue supportive care in the skilled environment.  5. Idiopathic chronic gout of multiple sites without tophus No new issues. Continue  allopurinol 100 mg qd     Family/ staff Communication: discussed with her caretaker  Labs/tests ordered:  NA

## 2019-02-27 ENCOUNTER — Other Ambulatory Visit: Payer: Self-pay | Admitting: *Deleted

## 2019-02-27 MED ORDER — LOSARTAN POTASSIUM 25 MG PO TABS: 12.5000 mg | ORAL_TABLET | Freq: Every day | ORAL | 3 refills | Status: DC

## 2019-03-02 ENCOUNTER — Encounter: Payer: Self-pay | Admitting: Adult Health

## 2019-03-02 ENCOUNTER — Non-Acute Institutional Stay (SKILLED_NURSING_FACILITY): Payer: Medicare Other | Admitting: Adult Health

## 2019-03-02 DIAGNOSIS — N1831 Chronic kidney disease, stage 3a: Secondary | ICD-10-CM

## 2019-03-02 DIAGNOSIS — Z20828 Contact with and (suspected) exposure to other viral communicable diseases: Secondary | ICD-10-CM | POA: Diagnosis not present

## 2019-03-02 DIAGNOSIS — D638 Anemia in other chronic diseases classified elsewhere: Secondary | ICD-10-CM | POA: Diagnosis not present

## 2019-03-02 DIAGNOSIS — I2510 Atherosclerotic heart disease of native coronary artery without angina pectoris: Secondary | ICD-10-CM

## 2019-03-02 DIAGNOSIS — F0390 Unspecified dementia without behavioral disturbance: Secondary | ICD-10-CM

## 2019-03-02 DIAGNOSIS — I5022 Chronic systolic (congestive) heart failure: Secondary | ICD-10-CM

## 2019-03-02 DIAGNOSIS — M1A09X0 Idiopathic chronic gout, multiple sites, without tophus (tophi): Secondary | ICD-10-CM

## 2019-03-02 DIAGNOSIS — Z9189 Other specified personal risk factors, not elsewhere classified: Secondary | ICD-10-CM | POA: Diagnosis not present

## 2019-03-02 NOTE — Progress Notes (Signed)
[facility]   [facility] of Service:  SNF (31) Provider:    Peggye Leyhristy Renlee Floor, ANP [facility] [phone]   Kermit Baloeed, Tiffany L, DO  Patient Care Team: Kermit Baloeed, Tiffany L, DO as PCP - General (Geriatric Medicine) Marinus Mawaylor, Gregg W, MD as PCP - Cardiology (Cardiology) Fletcher AnonWert, Sharunda Salmon, NP as Nurse Practitioner (Nurse Practitioner)  Extended Emergency Contact Information Primary Emergency Contact: Teressa LowerBeavers,Maryjane Address: [address], [address]          [place], [place] [address] [place] Home Phone: [phone] Relation: Other Secondary Emergency Contact: Samuel BoucheLambert,Beverly  [place] Home Phone: [phone] Relation: Daughter  Code Status:  DNR Goals of care: Advanced Directive information Advanced Directives 01/13/2019  Does Patient Have a Medical Advance Directive? Yes  Type of Advance Directive Out of facility DNR (pink MOST or yellow form);Healthcare Power of [place];Living will  Does patient want to make changes to medical advance directive? No - Patient declined  Copy of Healthcare Power of Attorney in Chart? Yes - validated most recent copy scanned in chart (See row information)  Pre-existing out of facility DNR order (yellow form or pink MOST form) Pink MOST form placed in chart (order not valid for inpatient use);Yellow form placed in chart (order not valid for inpatient use)     Chief Complaint  Patient presents with  . Medical Management of Chronic Issues    HPI:  Pt is a 85 y.o. female seen today for medical management of chronic diseases.     She has a hx of dementia and resides in skilled care. She remains alert and pleasant but is confused regarding the details of her care. She is ambulatory for short distances. Takes naps several times a day.   Gout: She denies any joint pain or swelling.   CHF: No reports of weight gain, doe, sob, or increased swelling.   No issues chewing or swallowing her  food. After a period of weight loss her weight has stabilized.  Wt Readings from Last 3 Encounters:  03/02/19 158 lb 3.2 oz (71.8 kg)  02/09/19 160 lb (72.6 kg)  01/13/19 159 lb 12.8 oz (72.5 kg)   ACD: no issues with bleeding or weaknes  Lab Results  Component Value Date   HGB 11.3 (A) 02/27/2018   CAD: hx of angioplasty with stent    Past Medical History:  Diagnosis Date  . CAD (coronary artery disease)   . Chronic low back pain   . Contusion of foot, right 05/15/12  . Dementia (HCC) 10/16/2016   06/25/17 MMSE 18/30 passed clock  . Diverticulitis   . Dyslipidemia   . Dysphagia 12/11/2016  . H/O: hysterectomy   . HTN (hypertension)   . Memory disturbance 05/2012   MMSE 26/30, intact Clock test  . Nonischemic cardiomyopathy (HCC)   . Osteoporosis   . Pulmonary embolism (HCC)   . Right knee pain 05/15/12  . Spinal stenosis   . Ulcer disease   . Weight loss    Past Surgical History:  Procedure Laterality Date  . APPENDECTOMY  2010  . BACK SURGERY    . BIV ICD GENERTAOR CHANGE OUT N/A 05/24/2011   Procedure: BIV ICD GENERTAOR CHANGE OUT;  Surgeon: Marinus MawGregg W Taylor, MD;  [facility];  Service: Cardiovascular;  Laterality: N/A;  . CATARACT EXTRACTION    . COLON RESECTION  2005  . CORONARY ANGIOPLASTY WITH STENT PLACEMENT  01/2003  . HEMICOLECTOMY    . HIP  ARTHROPLASTY Left 04/05/2014   Procedure: ARTHROPLASTY BIPOLAR HIP;  Surgeon: Mauri Pole, MD;  Location: [facility] ORS;  Service: Orthopedics;  Laterality: Left;  . left shoulder replacement  1991   Dr. Collie Siad  . ORIF HIP FRACTURE Left 09/30/2016   Procedure: OPEN REDUCTION INTERNAL FIXATION PERIPROSTHETIC HIP FRACTURE;  Surgeon: Gaynelle Arabian, MD;  Location: [facility] ORS;  Service: Orthopedics;  Laterality: Left;  . right shoulder repair  1985   Dr. Collie Siad  . rotator cuff debridement  1999   Dr. Maureen Ralphs    Allergies  Allergen Reactions  . Arthrotec [Diclofenac-Misoprostol]   . Ibuprofen   . Lactose Intolerance (Gi)   .  Milk-Related Compounds Nausea And Vomiting    Cheese  . Other     Grass and ragweed   . Oxycontin [Oxycodone Hcl]   . Pollen Extract     Outpatient Encounter Medications as of 03/02/2019  Medication Sig  . acetaminophen (TYLENOL) 325 MG tablet Take 650 mg by mouth 3 (three) times daily.   Marland Kitchen albuterol (PROVENTIL HFA;VENTOLIN HFA) 108 (90 Base) MCG/ACT inhaler Inhale 2 puffs into the lungs every 6 (six) hours as needed for wheezing or shortness of breath.  . allopurinol (ZYLOPRIM) 100 MG tablet Take 100 mg by mouth daily.  Marland Kitchen aspirin EC 81 MG tablet Take 81 mg by mouth daily.  . carvedilol (COREG) 3.125 MG tablet Take 1.5625 mg by mouth 2 (two) times daily with a meal.   . cyanocobalamin 1000 MCG tablet Take 1,000 mcg by mouth daily.   Mariane Baumgarten Sodium 100 MG capsule Take 100 mg by mouth daily. May crush  . furosemide (LASIX) 20 MG tablet Take 20 mg by mouth every other day. Alternate between 40mg  every other day  . furosemide (LASIX) 40 MG tablet Take 40 mg by mouth every other day. Alternate with 20mg  lasix  . losartan (COZAAR) 25 MG tablet Take 0.5 tablets (12.5 mg total) by mouth at bedtime.  . Nutritional Supplements (FEEDING SUPPLEMENT, BOOST BREEZE,) LIQD Take 1 Container by mouth 2 (two) times daily.  Marland Kitchen omeprazole (PRILOSEC OTC) 20 MG tablet Take 20 mg by mouth 2 (two) times daily.  . polyethylene glycol (MIRALAX / GLYCOLAX) packet Take 17 g by mouth daily. Hold for loose stools  . potassium chloride (KLOR-CON) 20 MEQ packet Take 20 mEq by mouth every other day.  . Carboxymethylcellul-Glycerin (OPTIVE) 0.5-0.9 % SOLN Apply 2 drops to eye 2 (two) times daily.  . Zinc Oxide (DESITIN) 13 % CREA Apply topically as needed (after each bathroom use).   No facility-administered encounter medications on file as of 03/02/2019.    Review of Systems  Constitutional: Negative for activity change, appetite change, chills, diaphoresis, fatigue, fever and unexpected weight change.  HENT:  Negative for congestion.   Respiratory: Negative for cough, shortness of breath and wheezing.   Cardiovascular: Negative for chest pain, palpitations and leg swelling.  Gastrointestinal: Negative for abdominal distention, abdominal pain, constipation and diarrhea.  Genitourinary: Negative for difficulty urinating and dysuria.  Musculoskeletal: Positive for gait problem. Negative for arthralgias, back pain, joint swelling and myalgias.  Neurological: Negative for dizziness, tremors, seizures, syncope, facial asymmetry, speech difficulty, weakness, light-headedness, numbness and headaches.  Psychiatric/Behavioral: Positive for confusion. Negative for agitation and behavioral problems.    Immunization History  Administered Date(s) Administered  . Influenza Inj Mdck Quad Pf 01/05/2016  . Influenza, High Dose Seasonal PF 01/01/2019  . Influenza,inj,Quad PF,6+ Mos 01/07/2018  . Influenza-Unspecified 12/22/2014, 01/10/2017  . Pneumococcal Conjugate-13 12/14/2013  .  Pneumococcal Polysaccharide-23 03/29/2016  . Tdap 03/29/2016  . Zoster 11/03/2007  . Zoster Recombinat (Shingrix) 04/01/2017, 06/27/2017   Pertinent  Health Maintenance Due  Topic Date Due  . PNA vac Low Risk Adult  Completed  . DEXA SCAN  Discontinued   Fall Risk  04/22/2018  Falls in the past year? 0  Number falls in past yr: 0  Injury with Fall? 0  Risk for fall due to : Impaired mobility;Medication side effect;Mental status change;Impaired balance/gait  Follow up Falls evaluation completed;Education provided  Comment seen by therapy for all of this   Functional Status Survey:    Vitals:   03/02/19 1610  Weight: 158 lb 3.2 oz (71.8 kg)   Body mass index is 26.33 kg/m. Physical Exam Vitals and nursing note reviewed.  Constitutional:      General: She is not in acute distress.    Appearance: She is not diaphoretic.  HENT:     Head: Normocephalic and atraumatic.  Neck:     Vascular: No JVD.  Cardiovascular:      Rate and Rhythm: Normal rate and regular rhythm.     Heart sounds: No murmur.  Pulmonary:     Effort: Pulmonary effort is normal. No respiratory distress.     Breath sounds: Rales present. No wheezing.  Abdominal:     General: Bowel sounds are normal. There is no distension.     Palpations: Abdomen is soft.  Musculoskeletal:        General: No swelling, tenderness, deformity or signs of injury.     Right lower leg: No edema.     Left lower leg: No edema.  Lymphadenopathy:     Cervical: No cervical adenopathy.  Skin:    General: Skin is warm and dry.  Neurological:     General: No focal deficit present.     Mental Status: She is alert. Mental status is at baseline.  Psychiatric:        Mood and Affect: Mood normal.     Labs reviewed: Recent Labs    12/08/18 0000  NA 140  K 4.7  BUN 22*  CREATININE 0.8   No results for input(s): AST, ALT, ALKPHOS, BILITOT, PROT, ALBUMIN in the last 8760 hours. No results for input(s): WBC, NEUTROABS, HGB, HCT, MCV, PLT in the last 8760 hours. Lab Results  Component Value Date   TSH 3.77 12/08/2018   No results found for: HGBA1C Lab Results  Component Value Date   CHOL 159 02/09/2017   HDL 49 02/09/2017   LDLCALC 96 02/09/2017   TRIG 69 02/09/2017   CHOLHDL 3.2 02/09/2017    Significant Diagnostic Results in last 30 days:  CUP PACEART REMOTE DEVICE CHECK  Result Date: 02/06/2019 Scheduled remote reviewed.  Normal device function. 1 VHR episode, likely atrially driven 1 AHR episode, <2 minutes Next remote 91 days.   Assessment/Plan 1. Dementia without behavioral disturbance, unspecified dementia type (HCC) Progressive decline in cognition and physical function c/w the disease. Continue supportive care in the skilled environment. Not currently on medications due to lack of benefit   2. Stage 3a chronic kidney disease Continue to periodically monitor BMP and avoid nephrotoxic agents  3. Idiopathic chronic gout of multiple  sites without tophus No new issues, continue allopurinol 100 mg qd   4. Anemia of chronic disease Lab Results  Component Value Date   HGB 11.3 (A) 02/27/2018   Continue to monitor CBC peridiocally  5. Chronic systolic heart failure (HCC) Compensated. Continue lasix  40 mg and 20 mg alternating doses and arb therapy  6. Coronary artery disease involving native coronary artery of native heart without angina pectoris Continue baby asa Not on lipid therapy due to age and debility     Family/ staff Communication: resident   Labs/tests ordered:  NA

## 2019-03-03 NOTE — Progress Notes (Signed)
Remote pacemaker transmission.   

## 2019-03-09 DIAGNOSIS — Z9189 Other specified personal risk factors, not elsewhere classified: Secondary | ICD-10-CM | POA: Diagnosis not present

## 2019-03-09 DIAGNOSIS — Z20828 Contact with and (suspected) exposure to other viral communicable diseases: Secondary | ICD-10-CM | POA: Diagnosis not present

## 2019-03-16 DIAGNOSIS — Z9189 Other specified personal risk factors, not elsewhere classified: Secondary | ICD-10-CM | POA: Diagnosis not present

## 2019-03-16 DIAGNOSIS — Z20828 Contact with and (suspected) exposure to other viral communicable diseases: Secondary | ICD-10-CM | POA: Diagnosis not present

## 2019-03-22 DIAGNOSIS — Z20828 Contact with and (suspected) exposure to other viral communicable diseases: Secondary | ICD-10-CM | POA: Diagnosis not present

## 2019-03-22 DIAGNOSIS — Z9189 Other specified personal risk factors, not elsewhere classified: Secondary | ICD-10-CM | POA: Diagnosis not present

## 2019-03-24 LAB — NOVEL CORONAVIRUS, NAA: SARS-CoV-2, NAA: POSITIVE

## 2019-03-30 ENCOUNTER — Non-Acute Institutional Stay (SKILLED_NURSING_FACILITY): Payer: Medicare Other | Admitting: Adult Health

## 2019-03-30 DIAGNOSIS — U071 COVID-19: Secondary | ICD-10-CM

## 2019-03-30 DIAGNOSIS — Z9189 Other specified personal risk factors, not elsewhere classified: Secondary | ICD-10-CM | POA: Diagnosis not present

## 2019-03-30 DIAGNOSIS — F4321 Adjustment disorder with depressed mood: Secondary | ICD-10-CM | POA: Diagnosis not present

## 2019-03-30 NOTE — Progress Notes (Signed)
Location:  Medical illustrator of Service:  SNF (31) Provider:   [name], ANP [facility] [phone]   [name], DO  Patient Care Team: [name], DO as PCP - General (Geriatric Medicine) [name], MD as PCP - Cardiology (Cardiology) [name], NP as Nurse Practitioner (Nurse Practitioner)  Extended Emergency Contact Information Primary Emergency Contact: [name] Address: [address]          [place], [place] [address] [name] Home Phone: [phone] Relation: Other Secondary Emergency Contact: [name] [place] Home Phone: [phone] Relation: Daughter  Code Status:  DNR Goals of care: Advanced Directive information Advanced Directives 01/13/2019  Does Patient Have a Medical Advance Directive? Yes  Type of Advance Directive Out of facility DNR (pink MOST or yellow form);Healthcare Power of [place];Living will  Does patient want to make changes to medical advance directive? No - Patient declined  Copy of Healthcare Power of Attorney in Chart? Yes - validated most recent copy scanned in chart (See row information)  Pre-existing out of facility DNR order (yellow form or pink MOST form) Pink MOST form placed in chart (order not valid for inpatient use);Yellow form placed in chart (order not valid for inpatient use)     Chief Complaint  Patient presents with  . Acute Visit    crying, not eating    HPI:  [name] is a 86 y.o. female seen today for an acute visit for crying and not eating. She tested positive for covid 19 on 03/24/19 and was transported to the covid unit at [facility]. She initially required oxygen at 2 liters but is now 95% on RA. At times her oxygen drops into the 80s and 02 needs to be reapplied but due to her dementia she takes it off and doesn't remember the purpose for the 02.  She does not have any sob and denies any symptoms of  nausea, vomiting, abd pain, diarrhea, body aches, sore throat etc. No fevers.   She continues on enhanced isolation and does not have her usual caregivers 1:1 with her. The nurse reports that she is not eating and drinking well. She refuses to try milk shakes, nutritional drinks, lemon ice, solid food etc. She is eating about 25% of her meals at best. She is also crying regularly but doesn't indicate the reason. She was on zoloft a year ago but was tapered off as a part of a [place].   Also the nurse reports she has a small open area to her coccyx.   Past Medical History:  Diagnosis Date  . CAD (coronary artery disease)   . Chronic low back pain   . Contusion of foot, right 05/15/12  . Dementia (HCC) 10/16/2016   06/25/17 MMSE 18/30 passed clock  . Diverticulitis   . Dyslipidemia   . Dysphagia 12/11/2016  . H/O: hysterectomy   . HTN (hypertension)   . Memory disturbance 05/2012   MMSE 26/30, intact Clock test  . Nonischemic cardiomyopathy (HCC)   . Osteoporosis   . Pulmonary embolism (HCC)   . Right knee pain 05/15/12  . Spinal stenosis   . Ulcer disease   . Weight loss    Past Surgical History:  Procedure Laterality Date  . APPENDECTOMY  2010  . BACK SURGERY    . BIV ICD GENERTAOR CHANGE OUT N/A 05/24/2011   Procedure: BIV ICD GENERTAOR CHANGE OUT;  Surgeon: [name], MD;  Location: [place] CATH [facility];  Service: Cardiovascular;  Laterality: N/A;  . CATARACT EXTRACTION    . COLON RESECTION  2005  . CORONARY ANGIOPLASTY WITH STENT PLACEMENT  01/2003  . HEMICOLECTOMY    . HIP ARTHROPLASTY Left 04/05/2014   Procedure: ARTHROPLASTY BIPOLAR HIP;  Surgeon: Mauri Pole, MD;  Location: [facility];  Service: Orthopedics;  Laterality: Left;  . left shoulder replacement  1991   Dr. Collie Siad  . ORIF HIP FRACTURE Left 09/30/2016   Procedure: OPEN REDUCTION INTERNAL FIXATION PERIPROSTHETIC HIP FRACTURE;  Surgeon: Gaynelle Arabian, MD;  Location: [facility];  Service: Orthopedics;  Laterality: Left;  . right  shoulder repair  1985   Dr. Collie Siad  . rotator cuff debridement  1999   Dr. Maureen Ralphs    Allergies  Allergen Reactions  . Arthrotec [Diclofenac-Misoprostol]   . Ibuprofen   . Lactose Intolerance (Gi)   . Milk-Related Compounds Nausea And Vomiting    Cheese  . Other     Grass and ragweed   . Oxycontin [Oxycodone Hcl]   . Pollen Extract     Outpatient Encounter Medications as of 03/30/2019  Medication Sig  . acetaminophen (TYLENOL) 325 MG tablet Take 650 mg by mouth 3 (three) times daily.   Marland Kitchen albuterol (PROVENTIL HFA;VENTOLIN HFA) 108 (90 Base) MCG/ACT inhaler Inhale 2 puffs into the lungs every 6 (six) hours as needed for wheezing or shortness of breath.  . allopurinol (ZYLOPRIM) 100 MG tablet Take 100 mg by mouth daily.  Marland Kitchen aspirin EC 81 MG tablet Take 81 mg by mouth daily.  . carvedilol (COREG) 3.125 MG tablet Take 1.5625 mg by mouth 2 (two) times daily with a meal.   . cyanocobalamin 1000 MCG tablet Take 1,000 mcg by mouth daily.   Mariane Baumgarten Sodium 100 MG capsule Take 100 mg by mouth daily. May crush  . furosemide (LASIX) 20 MG tablet Take 20 mg by mouth every other day. Alternate between 40mg  every other day  . furosemide (LASIX) 40 MG tablet Take 40 mg by mouth every other day. Alternate with 20mg  lasix  . losartan (COZAAR) 25 MG tablet Take 0.5 tablets (12.5 mg total) by mouth at bedtime.  . Nutritional Supplements (FEEDING SUPPLEMENT, BOOST BREEZE,) LIQD Take 1 Container by mouth 2 (two) times daily.  Marland Kitchen omeprazole (PRILOSEC OTC) 20 MG tablet Take 20 mg by mouth 2 (two) times daily.  . polyethylene glycol (MIRALAX / GLYCOLAX) packet Take 17 g by mouth daily. Hold for loose stools  . potassium chloride (KLOR-CON) 20 MEQ packet Take 20 mEq by mouth every other day.  . Zinc Oxide (DESITIN) 13 % CREA Apply topically as needed (after each bathroom use).  . Carboxymethylcellul-Glycerin (OPTIVE) 0.5-0.9 % SOLN Apply 2 drops to eye 2 (two) times daily.   No facility-administered encounter  medications on file as of 03/30/2019.    Review of Systems  Constitutional: Positive for appetite change. Negative for chills, diaphoresis and fever.  Respiratory: Negative for cough and wheezing.   Cardiovascular: Negative for chest pain and leg swelling.  Gastrointestinal: Negative for abdominal pain, constipation, diarrhea, nausea and vomiting.  Genitourinary: Negative for dysuria and urgency.  Musculoskeletal: Positive for gait problem. Negative for back pain, myalgias and neck pain.  Skin: Positive for wound. Negative for rash.  Neurological: Negative for weakness.  Psychiatric/Behavioral: Positive for confusion. Negative for decreased concentration, dysphoric mood, hallucinations, self-injury and sleep disturbance. The patient is nervous/anxious. The patient is not hyperactive.     Immunization History  Administered  Date(s) Administered  . Influenza Inj Mdck Quad Pf 01/05/2016  . Influenza, High Dose Seasonal PF 01/01/2019  . Influenza,inj,Quad PF,6+ Mos 01/07/2018  . Influenza-Unspecified 12/22/2014, 01/10/2017  . Moderna SARS-COVID-2 Vaccination 03/24/2019  . Pneumococcal Conjugate-13 12/14/2013  . Pneumococcal Polysaccharide-23 03/29/2016  . Tdap 03/29/2016  . Zoster 11/03/2007  . Zoster Recombinat (Shingrix) 04/01/2017, 06/27/2017   Pertinent  Health Maintenance Due  Topic Date Due  . PNA vac Low Risk Adult  Completed  . DEXA SCAN  Discontinued   Fall Risk  04/22/2018  Falls in the past year? 0  Number falls in past yr: 0  Injury with Fall? 0  Risk for fall due to : Impaired mobility;Medication side effect;Mental status change;Impaired balance/gait  Follow up Falls evaluation completed;Education provided  Comment seen by therapy for all of this   Functional Status Survey:    Vitals:   03/31/19 0857  BP: 115/70  Pulse: 72  Resp: 18  Temp: 98 F (36.7 C)  SpO2: 95%   There is no height or weight on file to calculate BMI. Physical Exam Vitals and nursing  note reviewed.  Constitutional:      General: She is not in acute distress.    Appearance: She is not diaphoretic.  HENT:     Head: Normocephalic and atraumatic.  Neck:     Vascular: No JVD.  Cardiovascular:     Rate and Rhythm: Normal rate and regular rhythm.     Heart sounds: No murmur.  Pulmonary:     Effort: Pulmonary effort is normal. No respiratory distress.     Breath sounds: Rales present. No wheezing.  Abdominal:     General: Bowel sounds are normal. There is no distension.     Palpations: Abdomen is soft.     Tenderness: There is no abdominal tenderness. There is no right CVA tenderness or left CVA tenderness.  Musculoskeletal:     Right lower leg: No edema.     Left lower leg: No edema.  Skin:    General: Skin is warm and dry.  Neurological:     General: No focal deficit present.     Mental Status: She is alert. Mental status is at baseline.  Psychiatric:     Comments: flat     Labs reviewed: Recent Labs    12/08/18 0000  NA 140  K 4.7  BUN 22*  CREATININE 0.8   No results for input(s): AST, ALT, ALKPHOS, BILITOT, PROT, ALBUMIN in the last 8760 hours. No results for input(s): WBC, NEUTROABS, HGB, HCT, MCV, PLT in the last 8760 hours. Lab Results  Component Value Date   TSH 3.77 12/08/2018   No results found for: HGBA1C Lab Results  Component Value Date   CHOL 159 02/09/2017   HDL 49 02/09/2017   LDLCALC 96 02/09/2017   TRIG 69 02/09/2017   CHOLHDL 3.2 02/09/2017    Significant Diagnostic Results in last 30 days:  No results found.  Assessment/Plan 1. COVID-19 virus infection Improving respiratory wise but is not eating or drinking well. Depression and social isolation may contribute to this issue. See below Continue airborne precautions for at least 14 days   2. At risk for dehydration due to poor fluid intake Encourage oral intake with shakes, boost, or lemon ice which are things she tends to like. Would avoid IVF if possible given her hx  of CHF and dementia (might not leave the IV in place).   Decrease lasix 20 mg qd and kdur 10  meq qd  3. Situational depression Begin Zoloft 25 mg qhs    Family/ staff Communication: discussed with her daughter Meriam Sprague and daughter n law Germaine Pomfret.    Labs/tests ordered:   CBC BMP

## 2019-03-31 ENCOUNTER — Encounter: Payer: Self-pay | Admitting: Internal Medicine

## 2019-03-31 ENCOUNTER — Encounter: Payer: Self-pay | Admitting: Adult Health

## 2019-03-31 ENCOUNTER — Non-Acute Institutional Stay (SKILLED_NURSING_FACILITY): Payer: Medicare Other | Admitting: Internal Medicine

## 2019-03-31 DIAGNOSIS — I5022 Chronic systolic (congestive) heart failure: Secondary | ICD-10-CM | POA: Diagnosis not present

## 2019-03-31 DIAGNOSIS — U071 COVID-19: Secondary | ICD-10-CM | POA: Diagnosis not present

## 2019-03-31 DIAGNOSIS — Z9189 Other specified personal risk factors, not elsewhere classified: Secondary | ICD-10-CM

## 2019-03-31 DIAGNOSIS — F0390 Unspecified dementia without behavioral disturbance: Secondary | ICD-10-CM | POA: Diagnosis not present

## 2019-03-31 DIAGNOSIS — N1831 Chronic kidney disease, stage 3a: Secondary | ICD-10-CM

## 2019-03-31 NOTE — Progress Notes (Signed)
Patient ID: [name], female   DOB: 23-Jan-1921, 86 y.o.   MRN: [id number]  Location:  [facility] Room Number: 159 Rehab Place of Service:  SNF [phone]) Provider:   [name], DO  Patient Care Team: [name], DO as PCP - General (Geriatric Medicine) [name], MD as PCP - Cardiology (Cardiology) [name], NP as Nurse Practitioner (Nurse Practitioner)  Extended Emergency Contact Information Primary Emergency Contact: [name] Address: [address], [address]          [place], [place] [address] [name] Home Phone: [phone] Relation: Other Secondary Emergency Contact: [name] [place] Home Phone: [phone] Relation: Daughter  Code Status:  DNR Goals of care: Advanced Directive information Advanced Directives 01/13/2019  Does Patient Have a Medical Advance Directive? Yes  Type of Advance Directive Out of facility DNR (pink MOST or yellow form);Healthcare Power of [place];Living will  Does patient want to make changes to medical advance directive? No - Patient declined  Copy of Healthcare Power of Attorney in Chart? Yes - validated most recent copy scanned in chart (See row information)  Pre-existing out of facility DNR order (yellow form or pink MOST form) Pink MOST form placed in chart (order not valid for inpatient use);Yellow form placed in chart (order not valid for inpatient use)   Chief Complaint  Patient presents with  . Acute Visit    Covid, bradycardia, hypoxia    HPI:  Pt is a 86 y.o. female seen today for an acute visit for COVID-19 infection.  She is in the covid unit due to her positive test.  I'd just been told that she was doing quite well and her family was updated that they would try to move her back to her normal room and maintain isolation (since that entire unit has become a covid unit); however, the nurse then went in to check her and she'd removed  her top along with her oxygen and she was having difficulty breathing.  Her HR had dropped to 37, then 32 and her POX was 65%.  She was clearly dyspneic and tachypneic.  I asked them to put her to bed and nursing replaced her oxygen (which she continues to want to remove).  Her sats immediately came up to 92% on 2L and HR improved to the 60s.  She continues to have poor appetite and po intake.  She is disoriented in her rehab room.   She has some crackles at her bases of her lungs.  Not coughing during my visit.    Past Medical History:  Diagnosis Date  . CAD (coronary artery disease)   . Chronic low back pain   . Contusion of foot, right 05/15/12  . Dementia (HCC) 10/16/2016   06/25/17 MMSE 18/30 passed clock  . Diverticulitis   . Dyslipidemia   . Dysphagia 12/11/2016  . H/O: hysterectomy   . HTN (hypertension)   . Memory disturbance 05/2012   MMSE 26/30, intact Clock test  . Nonischemic cardiomyopathy (HCC)   . Osteoporosis   . Pulmonary embolism (HCC)   . Right knee pain 05/15/12  . Spinal stenosis   . Ulcer disease   . Weight loss    Past Surgical History:  Procedure Laterality Date  . APPENDECTOMY  2010  . BACK SURGERY    . BIV ICD GENERTAOR CHANGE OUT N/A 05/24/2011   Procedure: BIV ICD GENERTAOR CHANGE OUT;  Surgeon: [name], MD;  Location: [place] CATH LAB;  Service: Cardiovascular;  Laterality: N/A;  . CATARACT EXTRACTION    . COLON RESECTION  2005  . CORONARY ANGIOPLASTY WITH STENT PLACEMENT  01/2003  . HEMICOLECTOMY    . HIP ARTHROPLASTY Left 04/05/2014   Procedure: ARTHROPLASTY BIPOLAR HIP;  Surgeon: Mauri Pole, MD;  Location: [facility];  Service: Orthopedics;  Laterality: Left;  . left shoulder replacement  1991   Dr. Collie Siad  . ORIF HIP FRACTURE Left 09/30/2016   Procedure: OPEN REDUCTION INTERNAL FIXATION PERIPROSTHETIC HIP FRACTURE;  Surgeon: Gaynelle Arabian, MD;  Location: [facility];  Service: Orthopedics;  Laterality: Left;  . right shoulder repair  1985   Dr. Collie Siad  .  rotator cuff debridement  1999   Dr. Maureen Ralphs    Allergies  Allergen Reactions  . Arthrotec [Diclofenac-Misoprostol]   . Ibuprofen   . Lactose Intolerance (Gi)   . Milk-Related Compounds Nausea And Vomiting    Cheese  . Other     Grass and ragweed   . Oxycontin [Oxycodone Hcl]   . Pollen Extract     Outpatient Encounter Medications as of 03/31/2019  Medication Sig  . acetaminophen (TYLENOL) 325 MG tablet Take 650 mg by mouth 3 (three) times daily.   Marland Kitchen albuterol (PROVENTIL HFA;VENTOLIN HFA) 108 (90 Base) MCG/ACT inhaler Inhale 2 puffs into the lungs every 6 (six) hours as needed for wheezing or shortness of breath.  . allopurinol (ZYLOPRIM) 100 MG tablet Take 100 mg by mouth daily.  Marland Kitchen aspirin EC 81 MG tablet Take 81 mg by mouth daily.  . Carboxymethylcellul-Glycerin (OPTIVE) 0.5-0.9 % SOLN Apply 2 drops to eye 2 (two) times daily.  . carvedilol (COREG) 3.125 MG tablet Take 1.5625 mg by mouth 2 (two) times daily with a meal.   . cyanocobalamin 1000 MCG tablet Take 1,000 mcg by mouth daily.   Mariane Baumgarten Sodium 100 MG capsule Take 100 mg by mouth daily. May crush  . furosemide (LASIX) 20 MG tablet Take 20 mg by mouth daily.  Marland Kitchen losartan (COZAAR) 25 MG tablet Take 0.5 tablets (12.5 mg total) by mouth at bedtime.  . Nutritional Supplements (FEEDING SUPPLEMENT, BOOST BREEZE,) LIQD Take 1 Container by mouth 2 (two) times daily.  Marland Kitchen omeprazole (PRILOSEC OTC) 20 MG tablet Take 20 mg by mouth 2 (two) times daily.  . polyethylene glycol (MIRALAX / GLYCOLAX) packet Take 17 g by mouth daily. Hold for loose stools  . potassium chloride (KLOR-CON) 10 MEQ tablet Take 10 mEq by mouth daily.  . sertraline (ZOLOFT) 25 MG tablet Take 25 mg by mouth at bedtime.  . Zinc Oxide (DESITIN) 13 % CREA Apply topically as needed (after each bathroom use).   No facility-administered encounter medications on file as of 03/31/2019.    Review of Systems  Constitutional: Positive for activity change, appetite change  and fatigue. Negative for chills and fever.  HENT: Negative for congestion and sore throat.   Eyes: Negative for visual disturbance.  Respiratory: Positive for cough and shortness of breath.   Cardiovascular: Negative for chest pain, palpitations and leg swelling.       Bradycardia spells  Gastrointestinal: Negative for abdominal pain, diarrhea, nausea and vomiting.  Genitourinary: Negative for dysuria.  Musculoskeletal: Positive for arthralgias and gait problem.  Skin: Positive for pallor. Negative for color change.    Immunization History  Administered Date(s) Administered  . Influenza Inj Mdck Quad Pf 01/05/2016  . Influenza, High Dose Seasonal PF 01/01/2019  . Influenza,inj,Quad PF,6+ Mos 01/07/2018  .  Influenza-Unspecified 12/22/2014, 01/10/2017  . Moderna SARS-COVID-2 Vaccination 03/24/2019  . Pneumococcal Conjugate-13 12/14/2013  . Pneumococcal Polysaccharide-23 03/29/2016  . Tdap 03/29/2016  . Zoster 11/03/2007  . Zoster Recombinat (Shingrix) 04/01/2017, 06/27/2017   Pertinent  Health Maintenance Due  Topic Date Due  . PNA vac Low Risk Adult  Completed  . DEXA SCAN  Discontinued   Fall Risk  04/22/2018  Falls in the past year? 0  Number falls in past yr: 0  Injury with Fall? 0  Risk for fall due to : Impaired mobility;Medication side effect;Mental status change;Impaired balance/gait  Follow up Falls evaluation completed;Education provided  Comment seen by therapy for all of this   Functional Status Survey:    Vitals:   03/31/19 1524  BP: 130/72  Pulse: (!) 56  Resp: 16  Temp: 97.9 F (36.6 C)  TempSrc: Oral  SpO2: (!) 88%  Weight: 156 lb (70.8 kg)  Height: 5\' 5"  (1.651 m)   Body mass index is 25.96 kg/m. Physical Exam Vitals reviewed.  Constitutional:      Appearance: Normal appearance.  HENT:     Head: Normocephalic and atraumatic.  Cardiovascular:     Rate and Rhythm: Bradycardia present.  Pulmonary:     Effort: Pulmonary effort is normal.      Breath sounds: Rhonchi and rales present.  Abdominal:     General: Bowel sounds are normal.     Palpations: Abdomen is soft.  Musculoskeletal:        General: Normal range of motion.     Right lower leg: No edema.     Left lower leg: No edema.  Skin:    General: Skin is warm and dry.     Coloration: Skin is pale.  Neurological:     General: No focal deficit present.     Mental Status: She is alert.  Psychiatric:        Mood and Affect: Mood normal.     Labs reviewed: Recent Labs    12/08/18 0000  NA 140  K 4.7  BUN 22*  CREATININE 0.8   No results for input(s): AST, ALT, ALKPHOS, BILITOT, PROT, ALBUMIN in the last 8760 hours. No results for input(s): WBC, NEUTROABS, HGB, HCT, MCV, PLT in the last 8760 hours. Lab Results  Component Value Date   TSH 3.77 12/08/2018   No results found for: HGBA1C Lab Results  Component Value Date   CHOL 159 02/09/2017   HDL 49 02/09/2017   LDLCALC 96 02/09/2017   TRIG 69 02/09/2017   CHOLHDL 3.2 02/09/2017    Assessment/Plan 1. COVID-19 virus infection -unfortunately, she's not doing well -dyspnea was severe during my visit but did resolve when she was put to bed and her heart rate came up -continue conservative therapy and rehab support for now--may do better back in room with her cognitive decline  2. At risk for dehydration due to poor fluid intake -push fluids, encourage po intake  3. Dementia without behavioral disturbance, unspecified dementia type (HCC) -reorientation, recommend moving back to her apt that she's familiar with if possible and family is agreeable   4. Chronic systolic heart failure (HCC) -does not appear to be in volume overload, high risk for this if IVFs given  5. Stage 3a chronic kidney disease -Avoid nephrotoxic agents like nsaids, dose adjust renally excreted meds, hydrate. -high risk for acute renal failure -goals are comfort-based so encourage po fluids--MOST does allow for IVFs, however, if we  think she will benefit  Family/  staff Communication: discussed with snf nurse and nurse manager  Labs/tests ordered: no new  Tarius Stangelo L. Vamsi Apfel, D.O. Geriatrics [facility] [address], [place] [address] Cell Phone (Mon-Fri 8am-5pm):  [phone] On Call:  [phone] & follow prompts after 5pm & weekends Office Phone:  [phone] Office Fax:  [phone]

## 2019-04-02 DIAGNOSIS — I5022 Chronic systolic (congestive) heart failure: Secondary | ICD-10-CM | POA: Diagnosis not present

## 2019-04-02 DIAGNOSIS — D649 Anemia, unspecified: Secondary | ICD-10-CM | POA: Diagnosis not present

## 2019-04-02 LAB — CBC AND DIFFERENTIAL
HCT: 30 — AB (ref 36–46)
Hemoglobin: 14 (ref 12.0–16.0)
Neutrophils Absolute: 3
Platelets: 157 (ref 150–399)
WBC: 4.4

## 2019-04-02 LAB — BASIC METABOLIC PANEL
BUN: 26 — AB (ref 4–21)
CO2: 21 (ref 13–22)
Chloride: 105 (ref 99–108)
Creatinine: 0.9 (ref 0.5–1.1)
Glucose: 100
Potassium: 4.6 (ref 3.4–5.3)
Sodium: 144 (ref 137–147)

## 2019-04-02 LAB — CBC: RBC: 2.81 — AB (ref 3.87–5.11)

## 2019-04-02 LAB — COMPREHENSIVE METABOLIC PANEL: Calcium: 9.4 (ref 8.7–10.7)

## 2019-04-13 ENCOUNTER — Telehealth: Payer: Self-pay | Admitting: Internal Medicine

## 2019-04-13 NOTE — Telephone Encounter (Signed)
Left detailed message for EC.  Advised would be ok for daughter to accompany Pt to appt, but appt is not necessary at this time (Pt covid positive and actively being treated)  Advised ok to continue to follow remotely and possibly put in recall for 6 months or a year.  Will forward to scheduler to call EC and cancel current appt-decide if EC would like 6 mo or 1 yr recall entered.

## 2019-04-13 NOTE — Telephone Encounter (Signed)
Patient's daughter in law Germaine Pomfret calling to request the patient's daughter [name] come to the patients appointment 2/11. She states the patient has dementia and will not be able to answer questions on her own. Germaine Pomfret would also like to discuss if the appointment is necessary.

## 2019-04-16 NOTE — Telephone Encounter (Signed)
Upcoming appt cancelled and recalls entered

## 2019-04-27 ENCOUNTER — Encounter: Payer: Self-pay | Admitting: Adult Health

## 2019-04-27 ENCOUNTER — Non-Acute Institutional Stay (SKILLED_NURSING_FACILITY): Payer: Medicare Other | Admitting: Adult Health

## 2019-04-27 DIAGNOSIS — R531 Weakness: Secondary | ICD-10-CM

## 2019-04-27 DIAGNOSIS — F0390 Unspecified dementia without behavioral disturbance: Secondary | ICD-10-CM

## 2019-04-27 DIAGNOSIS — K449 Diaphragmatic hernia without obstruction or gangrene: Secondary | ICD-10-CM

## 2019-04-27 DIAGNOSIS — I5022 Chronic systolic (congestive) heart failure: Secondary | ICD-10-CM

## 2019-04-27 DIAGNOSIS — Z950 Presence of cardiac pacemaker: Secondary | ICD-10-CM | POA: Diagnosis not present

## 2019-04-27 DIAGNOSIS — R634 Abnormal weight loss: Secondary | ICD-10-CM

## 2019-04-27 DIAGNOSIS — U071 COVID-19: Secondary | ICD-10-CM

## 2019-04-27 DIAGNOSIS — F4321 Adjustment disorder with depressed mood: Secondary | ICD-10-CM

## 2019-04-27 NOTE — Progress Notes (Signed)
Location:  Medical illustrator of Service:  SNF (31) Provider:   [name], ANP [facility] [phone]  [name], DO  Patient Care Team: [name], DO as PCP - General (Geriatric Medicine) [name], MD as PCP - Cardiology (Cardiology) [name], NP as Nurse Practitioner (Nurse Practitioner)  Extended Emergency Contact Information Primary Emergency Contact: [name] Address: [address]          [place], [place] [address] [name] Home Phone: [phone] Relation: Other Secondary Emergency Contact: [name] [place] Home Phone: [phone] Relation: Daughter  Code Status:  DNR Goals of care: Advanced Directive information Advanced Directives 01/13/2019  Does Patient Have a Medical Advance Directive? Yes  Type of Advance Directive Out of facility DNR (pink MOST or yellow form);Healthcare Power of [place];Living will  Does patient want to make changes to medical advance directive? No - Patient declined  Copy of Healthcare Power of Attorney in Chart? Yes - validated most recent copy scanned in chart (See row information)  Pre-existing out of facility DNR order (yellow form or pink MOST form) Pink MOST form placed in chart (order not valid for inpatient use);Yellow form placed in chart (order not valid for inpatient use)     Chief Complaint  Patient presents with  . Medical Management of Chronic Issues    HPI:  Pt is a 86 y.o. female seen today for medical management of chronic diseases.    She was diagnosed with Covid in Jan and had mostly a mild case. She did require some oxygen but is now on room air. She has no cough, sob, etc.  She has had a low appetite even prior to Covid, but the virus exacerbated this issue. She has lost 9 lbs since Jan. She is beginning to have improved intake and takes boost.  The nurse reports that she is weak and no longer  walking. They tried a stand up lift but she would hang in the lift which was hurting her arms and so they moved to a hoyer lift. Prior to the infection she could walk short distances with a hoyer lift. In addition, during her covid infection she was having crying episodes and appeared depressed. She was previously on Zoloft and this was restarted. The nurse reports that she has not had any crying episodes and seems to have improved but has a flat affect.    Past Medical History:  Diagnosis Date  . CAD (coronary artery disease)   . Chronic low back pain   . Contusion of foot, right 05/15/12  . Dementia (HCC) 10/16/2016   06/25/17 MMSE 18/30 passed clock  . Diverticulitis   . Dyslipidemia   . Dysphagia 12/11/2016  . H/O: hysterectomy   . HTN (hypertension)   . Memory disturbance 05/2012   MMSE 26/30, intact Clock test  . Nonischemic cardiomyopathy (HCC)   . Osteoporosis   . Pulmonary embolism (HCC)   . Right knee pain 05/15/12  . Spinal stenosis   . Ulcer disease   . Weight loss    Past Surgical History:  Procedure Laterality Date  . APPENDECTOMY  2010  . BACK SURGERY    . BIV ICD GENERTAOR CHANGE OUT N/A 05/24/2011   Procedure: BIV ICD GENERTAOR CHANGE OUT;  Surgeon: [name], MD;  Location: [facility] CATH LAB;  Service: Cardiovascular;  Laterality: N/A;  . CATARACT EXTRACTION    . COLON RESECTION  2005  . CORONARY ANGIOPLASTY WITH STENT PLACEMENT  01/2003  . HEMICOLECTOMY    . HIP ARTHROPLASTY Left 04/05/2014   Procedure: ARTHROPLASTY BIPOLAR HIP;  Surgeon: Mauri Pole, MD;  Location: [facility] ORS;  Service: Orthopedics;  Laterality: Left;  . left shoulder replacement  1991   Dr. Collie Siad  . ORIF HIP FRACTURE Left 09/30/2016   Procedure: OPEN REDUCTION INTERNAL FIXATION PERIPROSTHETIC HIP FRACTURE;  Surgeon: Gaynelle Arabian, MD;  Location: [facility] ORS;  Service: Orthopedics;  Laterality: Left;  . right shoulder repair  1985   Dr. Collie Siad  . rotator cuff debridement  1999   Dr. Maureen Ralphs    Allergies    Allergen Reactions  . Arthrotec [Diclofenac-Misoprostol]   . Ibuprofen   . Lactose Intolerance (Gi)   . Milk-Related Compounds Nausea And Vomiting    Cheese  . Other     Grass and ragweed   . Oxycontin [Oxycodone Hcl]   . Pollen Extract     Outpatient Encounter Medications as of 04/27/2019  Medication Sig  . acetaminophen (TYLENOL) 325 MG tablet Take 650 mg by mouth 3 (three) times daily.   Marland Kitchen albuterol (PROVENTIL HFA;VENTOLIN HFA) 108 (90 Base) MCG/ACT inhaler Inhale 2 puffs into the lungs every 6 (six) hours as needed for wheezing or shortness of breath.  . allopurinol (ZYLOPRIM) 100 MG tablet Take 100 mg by mouth daily.  Marland Kitchen aspirin EC 81 MG tablet Take 81 mg by mouth daily.  . carvedilol (COREG) 3.125 MG tablet Take 1.5625 mg by mouth 2 (two) times daily with a meal.   . cyanocobalamin 1000 MCG tablet Take 1,000 mcg by mouth daily.   Mariane Baumgarten Sodium 100 MG capsule Take 100 mg by mouth daily. May crush  . furosemide (LASIX) 20 MG tablet Take 20 mg by mouth daily.  Marland Kitchen losartan (COZAAR) 25 MG tablet Take 0.5 tablets (12.5 mg total) by mouth at bedtime.  Marland Kitchen omeprazole (PRILOSEC OTC) 20 MG tablet Take 20 mg by mouth 2 (two) times daily.  . polyethylene glycol (MIRALAX / GLYCOLAX) packet Take 17 g by mouth daily. Hold for loose stools  . sertraline (ZOLOFT) 50 MG tablet Take 50 mg by mouth at bedtime.  . Zinc Oxide (DESITIN) 13 % CREA Apply topically as needed (after each bathroom use).  . Carboxymethylcellul-Glycerin (OPTIVE) 0.5-0.9 % SOLN Apply 2 drops to eye 2 (two) times daily.  . Nutritional Supplements (FEEDING SUPPLEMENT, BOOST BREEZE,) LIQD Take 1 Container by mouth daily.   . potassium chloride (KLOR-CON) 10 MEQ tablet Take 10 mEq by mouth daily.  . [DISCONTINUED] sertraline (ZOLOFT) 25 MG tablet Take 25 mg by mouth at bedtime.   No facility-administered encounter medications on file as of 04/27/2019.    Review of Systems  Constitutional: Positive for activity change, appetite  change and unexpected weight change. Negative for chills, diaphoresis, fatigue and fever.  HENT: Negative for congestion.   Respiratory: Negative for cough, shortness of breath and wheezing.   Cardiovascular: Negative for chest pain, palpitations and leg swelling.  Gastrointestinal: Negative for abdominal distention, abdominal pain, constipation and diarrhea.  Genitourinary: Negative for difficulty urinating and dysuria.  Musculoskeletal: Positive for gait problem. Negative for arthralgias, back pain, joint swelling and myalgias.  Skin: Positive for wound (coccyx healing ).  Neurological: Negative for dizziness, tremors, seizures, syncope, facial asymmetry, speech difficulty, weakness, light-headedness, numbness and headaches.  Psychiatric/Behavioral: Positive for confusion. Negative for agitation and behavioral problems.    Immunization History  Administered Date(s) Administered  . Influenza Inj  Mdck Quad Pf 01/05/2016  . Influenza, High Dose Seasonal PF 01/01/2019  . Influenza,inj,Quad PF,6+ Mos 01/07/2018  . Influenza-Unspecified 12/22/2014, 01/10/2017  . Moderna SARS-COVID-2 Vaccination 03/24/2019, 04/21/2019  . Pneumococcal Conjugate-13 12/14/2013  . Pneumococcal Polysaccharide-23 03/29/2016  . Tdap 03/29/2016  . Zoster 11/03/2007  . Zoster Recombinat (Shingrix) 04/01/2017, 06/27/2017   Pertinent  Health Maintenance Due  Topic Date Due  . PNA vac Low Risk Adult  Completed  . DEXA SCAN  Discontinued   Fall Risk  04/22/2018  Falls in the past year? 0  Number falls in past yr: 0  Injury with Fall? 0  Risk for fall due to : Impaired mobility;Medication side effect;Mental status change;Impaired balance/gait  Follow up Falls evaluation completed;Education provided  Comment seen by therapy for all of this   Functional Status Survey:    Vitals:   04/27/19 1538  Weight: 149 lb 12.8 oz (67.9 kg)   Body mass index is 24.93 kg/m.  Wt Readings from Last 3 Encounters:  04/27/19  149 lb 12.8 oz (67.9 kg)  03/31/19 156 lb (70.8 kg)  03/02/19 158 lb 3.2 oz (71.8 kg)    Physical Exam Vitals and nursing note reviewed.  Constitutional:      General: She is not in acute distress.    Appearance: She is not diaphoretic.  HENT:     Head: Normocephalic and atraumatic.     Mouth/Throat:     Pharynx: Oropharynx is clear. No oropharyngeal exudate.  Neck:     Vascular: No JVD.  Cardiovascular:     Rate and Rhythm: Normal rate and regular rhythm.     Heart sounds: No murmur.  Pulmonary:     Effort: Pulmonary effort is normal. No respiratory distress.     Breath sounds: Rales present. No wheezing.  Abdominal:     General: Bowel sounds are normal. There is no distension.     Palpations: Abdomen is soft.  Musculoskeletal:     Cervical back: No rigidity or tenderness.     Right lower leg: No edema.     Left lower leg: No edema.  Lymphadenopathy:     Cervical: No cervical adenopathy.  Skin:    General: Skin is warm and dry.     Findings: Bruising (BLE) present.  Neurological:     General: No focal deficit present.     Mental Status: She is alert. Mental status is at baseline.  Psychiatric:        Mood and Affect: Mood normal.     Labs reviewed: Recent Labs    12/08/18 0000  NA 140  K 4.7  BUN 22*  CREATININE 0.8   No results for input(s): AST, ALT, ALKPHOS, BILITOT, PROT, ALBUMIN in the last 8760 hours. No results for input(s): WBC, NEUTROABS, HGB, HCT, MCV, PLT in the last 8760 hours. Lab Results  Component Value Date   TSH 3.77 12/08/2018   No results found for: HGBA1C Lab Results  Component Value Date   CHOL 159 02/09/2017   HDL 49 02/09/2017   LDLCALC 96 02/09/2017   TRIG 69 02/09/2017   CHOLHDL 3.2 02/09/2017    Significant Diagnostic Results in last 30 days:  No results found.  Assessment/Plan 1. COVID-19 virus infection Mild/Mod in nature. Off oxygen but continues with weakness.   2. Weakness Generalized  Due to prior infection.  More alert and able to f/c.  Mood improved. Will order for PT to eval and treat.   3. Dementia without behavioral disturbance, unspecified  dementia type (HCC) Progressive decline in cognition and physical function c/w the disease. Continue supportive care in the skilled environment.  4. Biventricular cardiac pacemaker in situ Remote transmission recorded 02/06/19  5. Hiatal hernia With hx of ulcer disease.  No current symptoms.  Would not taper PPI therapy.   6. Chronic systolic CHF Appears compensated and is doing well with reduced dose lasix 20 mg qd and losartan 12.5 mg qd   7. Weight loss Due to progressive dementia, worsening depression, and covid 19 Continue nutritional supplement. Hopefully we will see improvement as her mood is improving and she is recovering from Covid.   8. Situational depression Continue Zoloft 50 mg qhs  Family/ staff Communication: resident and nurse  Labs/tests ordered:  NA

## 2019-04-30 ENCOUNTER — Encounter: Payer: Medicare Other | Admitting: Internal Medicine

## 2019-05-05 DIAGNOSIS — M62562 Muscle wasting and atrophy, not elsewhere classified, left lower leg: Secondary | ICD-10-CM | POA: Diagnosis not present

## 2019-05-05 DIAGNOSIS — R278 Other lack of coordination: Secondary | ICD-10-CM | POA: Diagnosis not present

## 2019-05-05 DIAGNOSIS — M62561 Muscle wasting and atrophy, not elsewhere classified, right lower leg: Secondary | ICD-10-CM | POA: Diagnosis not present

## 2019-05-05 DIAGNOSIS — R2689 Other abnormalities of gait and mobility: Secondary | ICD-10-CM | POA: Diagnosis not present

## 2019-05-06 DIAGNOSIS — R278 Other lack of coordination: Secondary | ICD-10-CM | POA: Diagnosis not present

## 2019-05-06 DIAGNOSIS — M62562 Muscle wasting and atrophy, not elsewhere classified, left lower leg: Secondary | ICD-10-CM | POA: Diagnosis not present

## 2019-05-06 DIAGNOSIS — M62561 Muscle wasting and atrophy, not elsewhere classified, right lower leg: Secondary | ICD-10-CM | POA: Diagnosis not present

## 2019-05-06 DIAGNOSIS — R2689 Other abnormalities of gait and mobility: Secondary | ICD-10-CM | POA: Diagnosis not present

## 2019-05-08 DIAGNOSIS — R278 Other lack of coordination: Secondary | ICD-10-CM | POA: Diagnosis not present

## 2019-05-08 DIAGNOSIS — M62561 Muscle wasting and atrophy, not elsewhere classified, right lower leg: Secondary | ICD-10-CM | POA: Diagnosis not present

## 2019-05-08 DIAGNOSIS — M62562 Muscle wasting and atrophy, not elsewhere classified, left lower leg: Secondary | ICD-10-CM | POA: Diagnosis not present

## 2019-05-08 DIAGNOSIS — R2689 Other abnormalities of gait and mobility: Secondary | ICD-10-CM | POA: Diagnosis not present

## 2019-05-11 DIAGNOSIS — R2689 Other abnormalities of gait and mobility: Secondary | ICD-10-CM | POA: Diagnosis not present

## 2019-05-11 DIAGNOSIS — R278 Other lack of coordination: Secondary | ICD-10-CM | POA: Diagnosis not present

## 2019-05-11 DIAGNOSIS — M62561 Muscle wasting and atrophy, not elsewhere classified, right lower leg: Secondary | ICD-10-CM | POA: Diagnosis not present

## 2019-05-11 DIAGNOSIS — M62562 Muscle wasting and atrophy, not elsewhere classified, left lower leg: Secondary | ICD-10-CM | POA: Diagnosis not present

## 2019-05-14 DIAGNOSIS — M62562 Muscle wasting and atrophy, not elsewhere classified, left lower leg: Secondary | ICD-10-CM | POA: Diagnosis not present

## 2019-05-14 DIAGNOSIS — R2689 Other abnormalities of gait and mobility: Secondary | ICD-10-CM | POA: Diagnosis not present

## 2019-05-14 DIAGNOSIS — R278 Other lack of coordination: Secondary | ICD-10-CM | POA: Diagnosis not present

## 2019-05-14 DIAGNOSIS — M62561 Muscle wasting and atrophy, not elsewhere classified, right lower leg: Secondary | ICD-10-CM | POA: Diagnosis not present

## 2019-05-18 ENCOUNTER — Telehealth: Payer: Self-pay

## 2019-05-18 NOTE — Telephone Encounter (Signed)
Missy from [facility] wanted to know if we got the pt transmission. I told her we have not received it yet. She states she was going to do it today and give [place] a call back.

## 2019-05-19 DIAGNOSIS — M62561 Muscle wasting and atrophy, not elsewhere classified, right lower leg: Secondary | ICD-10-CM | POA: Diagnosis not present

## 2019-05-19 DIAGNOSIS — M62562 Muscle wasting and atrophy, not elsewhere classified, left lower leg: Secondary | ICD-10-CM | POA: Diagnosis not present

## 2019-05-19 DIAGNOSIS — R278 Other lack of coordination: Secondary | ICD-10-CM | POA: Diagnosis not present

## 2019-05-19 DIAGNOSIS — R2689 Other abnormalities of gait and mobility: Secondary | ICD-10-CM | POA: Diagnosis not present

## 2019-05-20 ENCOUNTER — Ambulatory Visit (INDEPENDENT_AMBULATORY_CARE_PROVIDER_SITE_OTHER): Payer: Medicare Other | Admitting: *Deleted

## 2019-05-20 DIAGNOSIS — I495 Sick sinus syndrome: Secondary | ICD-10-CM

## 2019-05-20 LAB — CUP PACEART REMOTE DEVICE CHECK
Battery Impedance: 1764 Ohm
Battery Remaining Longevity: 31 mo
Battery Voltage: 2.74 V
Brady Statistic AP VP Percent: 57 %
Brady Statistic AP VS Percent: 0 %
Brady Statistic AS VP Percent: 42 %
Brady Statistic AS VS Percent: 0 %
Date Time Interrogation Session: 20210303123323
Implantable Lead Implant Date: 20071005
Implantable Lead Implant Date: 20071005
Implantable Lead Location: 753858
Implantable Lead Location: 753859
Implantable Lead Model: 4194
Implantable Lead Model: 5076
Implantable Pulse Generator Implant Date: 20130307
Lead Channel Impedance Value: 404 Ohm
Lead Channel Impedance Value: 867 Ohm
Lead Channel Pacing Threshold Amplitude: 0.625 V
Lead Channel Pacing Threshold Amplitude: 1.375 V
Lead Channel Pacing Threshold Pulse Width: 0.4 ms
Lead Channel Pacing Threshold Pulse Width: 0.4 ms
Lead Channel Setting Pacing Amplitude: 2 V
Lead Channel Setting Pacing Amplitude: 3 V
Lead Channel Setting Pacing Pulse Width: 0.4 ms
Lead Channel Setting Sensing Sensitivity: 5.6 mV

## 2019-05-20 NOTE — Progress Notes (Signed)
PPM Remote  

## 2019-05-21 ENCOUNTER — Non-Acute Institutional Stay (SKILLED_NURSING_FACILITY): Payer: Medicare Other | Admitting: Adult Health

## 2019-05-21 ENCOUNTER — Encounter: Payer: Self-pay | Admitting: Adult Health

## 2019-05-21 DIAGNOSIS — R531 Weakness: Secondary | ICD-10-CM

## 2019-05-21 DIAGNOSIS — F4321 Adjustment disorder with depressed mood: Secondary | ICD-10-CM | POA: Diagnosis not present

## 2019-05-21 DIAGNOSIS — M1A09X0 Idiopathic chronic gout, multiple sites, without tophus (tophi): Secondary | ICD-10-CM

## 2019-05-21 DIAGNOSIS — R634 Abnormal weight loss: Secondary | ICD-10-CM | POA: Diagnosis not present

## 2019-05-21 DIAGNOSIS — F0390 Unspecified dementia without behavioral disturbance: Secondary | ICD-10-CM | POA: Diagnosis not present

## 2019-05-21 NOTE — Progress Notes (Signed)
Location:  Medical illustrator of Service:  SNF (31) Provider:   [name], ANP [facility] [phone]  [name], DO  Patient Care Team: [name], DO as PCP - General (Geriatric Medicine) [name], MD as PCP - Cardiology (Cardiology) [name], NP as Nurse Practitioner (Nurse Practitioner)  Extended Emergency Contact Information Primary Emergency Contact: [name] Address: [address]          [place], [place] [address] [name] Home Phone: [phone] Relation: Other Secondary Emergency Contact: [name] [place] Home Phone: [phone] Relation: Daughter  Code Status: DNR Goals of care: Advanced Directive information Advanced Directives 01/13/2019  Does Patient Have a Medical Advance Directive? Yes  Type of Advance Directive Out of facility DNR (pink MOST or yellow form);Healthcare Power of [place];Living will  Does patient want to make changes to medical advance directive? No - Patient declined  Copy of Healthcare Power of Attorney in Chart? Yes - validated most recent copy scanned in chart (See row information)  Pre-existing out of facility DNR order (yellow form or pink MOST form) Pink MOST form placed in chart (order not valid for inpatient use);Yellow form placed in chart (order not valid for inpatient use)     Chief Complaint  Patient presents with  . Medical Management of Chronic Issues    HPI:  Pt is a 86 y.o. female seen today for medical management of chronic diseases.    After recovering from covid (Dx 03/24/19) she has gained 6 lbs and has an improved appetite and energy level. The nurse reports she remains weaker and needs a lift for transfers. PT reports that he has not made much progress with her as she lacks motivation and has difficulty with following commands due to dementia.   Wt Readings from Last 3 Encounters:  05/21/19 155  lb 4.8 oz (70.4 kg)  04/27/19 149 lb 12.8 oz (67.9 kg)  03/31/19 156 lb (70.8 kg)   She was started back on zoloft for periods of crying and anxiety with care. Her mood has improved. She has periods where she is very talkative 1-2 days per week.   Dementia: progressively more confused over time, slightly worse after covid. Continues with 24 hr caretakers. Needs a lift for transfers assistance with dressing, bathing, all ADLs. Can feed herself.  Past Medical History:  Diagnosis Date  . CAD (coronary artery disease)   . Chronic low back pain   . Contusion of foot, right 05/15/12  . Dementia (HCC) 10/16/2016   06/25/17 MMSE 18/30 passed clock  . Diverticulitis   . Dyslipidemia   . Dysphagia 12/11/2016  . H/O: hysterectomy   . HTN (hypertension)   . Memory disturbance 05/2012   MMSE 26/30, intact Clock test  . Nonischemic cardiomyopathy (HCC)   . Osteoporosis   . Pulmonary embolism (HCC)   . Right knee pain 05/15/12  . Spinal stenosis   . Ulcer disease   . Weight loss    Past Surgical History:  Procedure Laterality Date  . APPENDECTOMY  2010  . BACK SURGERY    . BIV ICD GENERTAOR CHANGE OUT N/A 05/24/2011   Procedure: BIV ICD GENERTAOR CHANGE OUT;  Surgeon: [name], MD;  Location: [facility] CATH LAB;  Service: Cardiovascular;  Laterality: N/A;  . CATARACT EXTRACTION    . COLON RESECTION  2005  . CORONARY ANGIOPLASTY WITH STENT PLACEMENT  01/2003  . HEMICOLECTOMY    .  HIP ARTHROPLASTY Left 04/05/2014   Procedure: ARTHROPLASTY BIPOLAR HIP;  Surgeon: Mauri Pole, MD;  Location: [facility] OR[facility];  Service: Orthopedics;  Laterality: Left;  . left shoulder replacement  1991   Dr. Collie Siad  . ORIF HIP FRACTURE Left 09/30/2016   Procedure: OPEN REDUCTION INTERNAL FIXATION PERIPROSTHETIC HIP FRACTURE;  Surgeon: Gaynelle Arabian, MD;  Location: [facility] ORS;  Service: Orthopedics;  Laterality: Left;  . right shoulder repair  1985   Dr. Collie Siad  . rotator cuff debridement  1999   Dr. Maureen Ralphs    Allergies  Allergen  Reactions  . Arthrotec [Diclofenac-Misoprostol]   . Ibuprofen   . Lactose Intolerance (Gi)   . Milk-Related Compounds Nausea And Vomiting    Cheese  . Other     Grass and ragweed   . Oxycontin [Oxycodone Hcl]   . Pollen Extract   . Verelan [Verapamil]     Outpatient Encounter Medications as of 05/21/2019  Medication Sig  . acetaminophen (TYLENOL) 325 MG tablet Take 650 mg by mouth 3 (three) times daily.   Marland Kitchen albuterol (PROVENTIL HFA;VENTOLIN HFA) 108 (90 Base) MCG/ACT inhaler Inhale 2 puffs into the lungs every 6 (six) hours as needed for wheezing or shortness of breath.  . allopurinol (ZYLOPRIM) 100 MG tablet Take 100 mg by mouth daily.  Marland Kitchen aspirin EC 81 MG tablet Take 81 mg by mouth daily.  . Carboxymethylcellul-Glycerin (OPTIVE) 0.5-0.9 % SOLN Apply 2 drops to eye 2 (two) times daily.  . carvedilol (COREG) 3.125 MG tablet Take 1.5625 mg by mouth 2 (two) times daily with a meal.   . cyanocobalamin 1000 MCG tablet Take 1,000 mcg by mouth daily.   Mariane Baumgarten Sodium 100 MG capsule Take 100 mg by mouth daily. May crush  . furosemide (LASIX) 20 MG tablet Take 20 mg by mouth daily.  Marland Kitchen losartan (COZAAR) 25 MG tablet Take 0.5 tablets (12.5 mg total) by mouth at bedtime.  . Nutritional Supplements (FEEDING SUPPLEMENT, BOOST BREEZE,) LIQD Take 1 Container by mouth in the morning and at bedtime.   Marland Kitchen omeprazole (PRILOSEC OTC) 20 MG tablet Take 20 mg by mouth 2 (two) times daily.  . polyethylene glycol (MIRALAX / GLYCOLAX) packet Take 17 g by mouth daily. Hold for loose stools  . sertraline (ZOLOFT) 50 MG tablet Take 50 mg by mouth at bedtime.  . Zinc Oxide (DESITIN) 13 % CREA Apply topically as needed (after each bathroom use).  . potassium chloride (KLOR-CON) 10 MEQ tablet Take 10 mEq by mouth daily.   No facility-administered encounter medications on file as of 05/21/2019.    Review of Systems  Unable to perform ROS: Dementia    Immunization History  Administered Date(s) Administered  .  Influenza Inj Mdck Quad Pf 01/05/2016  . Influenza, High Dose Seasonal PF 01/01/2019  . Influenza,inj,Quad PF,6+ Mos 01/07/2018  . Influenza-Unspecified 12/22/2014, 01/10/2017  . Moderna SARS-COVID-2 Vaccination 03/24/2019, 04/21/2019  . Pneumococcal Conjugate-13 12/14/2013  . Pneumococcal Polysaccharide-23 03/29/2016  . Tdap 03/29/2016  . Zoster 11/03/2007  . Zoster Recombinat (Shingrix) 04/01/2017, 06/27/2017   Pertinent  Health Maintenance Due  Topic Date Due  . PNA vac Low Risk Adult  Completed  . DEXA SCAN  Discontinued   Fall Risk  04/22/2018  Falls in the past year? 0  Number falls in past yr: 0  Injury with Fall? 0  Risk for fall due to : Impaired mobility;Medication side effect;Mental status change;Impaired balance/gait  Follow up Falls evaluation completed;Education provided  Comment seen by therapy for  all of this   Functional Status Survey:    Vitals:   05/21/19 1425  Weight: 155 lb 4.8 oz (70.4 kg)   Body mass index is 25.84 kg/m. Physical Exam Vitals and nursing note reviewed.  Constitutional:      General: She is not in acute distress.    Appearance: She is not diaphoretic.  HENT:     Head: Normocephalic and atraumatic.  Neck:     Vascular: No JVD.  Cardiovascular:     Rate and Rhythm: Normal rate and regular rhythm.     Heart sounds: No murmur.  Pulmonary:     Effort: Pulmonary effort is normal. No respiratory distress.     Breath sounds: Rales present. No wheezing.  Abdominal:     General: Bowel sounds are normal. There is no distension.     Palpations: Abdomen is soft.  Musculoskeletal:        General: No swelling, tenderness or deformity.     Right lower leg: No edema.     Left lower leg: No edema.  Skin:    General: Skin is warm and dry.  Neurological:     General: No focal deficit present.     Mental Status: She is alert. Mental status is at baseline.  Psychiatric:        Mood and Affect: Mood normal.     Labs reviewed: Recent Labs     12/08/18 0000 04/02/19 0000  NA 140 144  K 4.7 4.6  CL  --  105  CO2  --  21  BUN 22* 26*  CREATININE 0.8 0.9  CALCIUM  --  9.4   No results for input(s): AST, ALT, ALKPHOS, BILITOT, PROT, ALBUMIN in the last 8760 hours. Recent Labs    04/02/19 0000  WBC 4.4  NEUTROABS 3  HGB 14.0  HCT 30*  PLT 157   Lab Results  Component Value Date   TSH 3.77 12/08/2018   No results found for: HGBA1C Lab Results  Component Value Date   CHOL 159 02/09/2017   HDL 49 02/09/2017   LDLCALC 96 02/09/2017   TRIG 69 02/09/2017   CHOLHDL 3.2 02/09/2017    Significant Diagnostic Results in last 30 days:  CUP PACEART REMOTE DEVICE CHECK  Result Date: 05/20/2019 Scheduled remote reviewed.  Normal device function.  Longest AMS 8 mins. Next remote 91 days.   Assessment/Plan 1. Dementia without behavioral disturbance, unspecified dementia type (HCC) Progressive decline in cognition, slightly worse after covid infection.  Continue supportive care in the skilled environment.   2. Weight loss Improved intake after recovery from covid.   3. Generalized weakness Not making progress with therapy due to progressive dementia. Will continue to try.   4. Situational depression Improved. Continue Zoloft 50 mg qhs. If increased periods of excessive talking are noted, may decrease the dose.   5. Idiopathic chronic gout of multiple sites without tophus No new issues. Continue allopurinol       Family/ staff Communication: caretaker and nurse  Labs/tests ordered: NA

## 2019-05-22 DIAGNOSIS — M62561 Muscle wasting and atrophy, not elsewhere classified, right lower leg: Secondary | ICD-10-CM | POA: Diagnosis not present

## 2019-05-22 DIAGNOSIS — R2689 Other abnormalities of gait and mobility: Secondary | ICD-10-CM | POA: Diagnosis not present

## 2019-05-22 DIAGNOSIS — R278 Other lack of coordination: Secondary | ICD-10-CM | POA: Diagnosis not present

## 2019-05-22 DIAGNOSIS — M62562 Muscle wasting and atrophy, not elsewhere classified, left lower leg: Secondary | ICD-10-CM | POA: Diagnosis not present

## 2019-05-25 DIAGNOSIS — M62562 Muscle wasting and atrophy, not elsewhere classified, left lower leg: Secondary | ICD-10-CM | POA: Diagnosis not present

## 2019-05-25 DIAGNOSIS — R278 Other lack of coordination: Secondary | ICD-10-CM | POA: Diagnosis not present

## 2019-05-25 DIAGNOSIS — R2689 Other abnormalities of gait and mobility: Secondary | ICD-10-CM | POA: Diagnosis not present

## 2019-05-25 DIAGNOSIS — M62561 Muscle wasting and atrophy, not elsewhere classified, right lower leg: Secondary | ICD-10-CM | POA: Diagnosis not present

## 2019-06-08 LAB — TSH: TSH: 6.46 — AB (ref <=5.90)

## 2019-06-30 ENCOUNTER — Encounter: Payer: Self-pay | Admitting: Internal Medicine

## 2019-06-30 DIAGNOSIS — M79671 Pain in right foot: Secondary | ICD-10-CM | POA: Diagnosis not present

## 2019-06-30 DIAGNOSIS — F0390 Unspecified dementia without behavioral disturbance: Secondary | ICD-10-CM

## 2019-06-30 DIAGNOSIS — M79672 Pain in left foot: Secondary | ICD-10-CM | POA: Diagnosis not present

## 2019-06-30 DIAGNOSIS — L84 Corns and callosities: Secondary | ICD-10-CM | POA: Diagnosis not present

## 2019-06-30 DIAGNOSIS — L602 Onychogryphosis: Secondary | ICD-10-CM | POA: Diagnosis not present

## 2019-06-30 NOTE — Progress Notes (Signed)
error    This encounter was created in error - please disregard.

## 2019-07-02 ENCOUNTER — Non-Acute Institutional Stay (SKILLED_NURSING_FACILITY): Payer: Medicare Other | Admitting: Adult Health

## 2019-07-02 ENCOUNTER — Encounter: Payer: Self-pay | Admitting: Adult Health

## 2019-07-02 ENCOUNTER — Encounter: Payer: Self-pay | Admitting: Internal Medicine

## 2019-07-02 DIAGNOSIS — G4701 Insomnia due to medical condition: Secondary | ICD-10-CM | POA: Diagnosis not present

## 2019-07-02 DIAGNOSIS — F4321 Adjustment disorder with depressed mood: Secondary | ICD-10-CM

## 2019-07-02 DIAGNOSIS — M25562 Pain in left knee: Secondary | ICD-10-CM

## 2019-07-02 NOTE — Progress Notes (Signed)
Location:  Medical illustrator of Service:  SNF (31) Provider:   [name], ANP [facility] [phone]   [name], DO  Patient Care Team: [name], DO as PCP - General (Geriatric Medicine) [name], MD as PCP - Cardiology (Cardiology) [name], NP as Nurse Practitioner (Nurse Practitioner)  Extended Emergency Contact Information Primary Emergency Contact: [name] Address: [address]          [place], [place] [address] [name] Home Phone: [phone] Relation: Other Secondary Emergency Contact: [name] [place] Home Phone: [phone] Relation: Daughter  Code Status:  DNR Goals of care: Advanced Directive information Advanced Directives 01/13/2019  Does Patient Have a Medical Advance Directive? Yes  Type of Advance Directive Out of facility DNR (pink MOST or yellow form);Healthcare Power of [place];Living will  Does patient want to make changes to medical advance directive? No - Patient declined  Copy of Healthcare Power of Attorney in Chart? Yes - validated most recent copy scanned in chart (See row information)  Pre-existing out of facility DNR order (yellow form or pink MOST form) Pink MOST form placed in chart (order not valid for inpatient use);Yellow form placed in chart (order not valid for inpatient use)     Chief Complaint  Patient presents with  . Acute Visit    left knee pain, anxiety, not sleeping    HPI:  Pt is a 86 y.o. female seen today for an acute visit for left knee pain, anxiety, and not sleeping. The nurse reports that her knee has been painful and slightly swollen for two days. There is no hx of fall or injury. They have been applying ice for comfort. She has a hx of gout and takes allopurinol. Also noted hx of two hip surgeries on the left. The caretaker reports that [name] has experienced rapid talking and anxiety in  the afternoon with attempts to get out of bed without help, increased confusion, and difficult sleeping at night. This seems to be worse after a family visit per the staff as she gets very excited since they are now allowed to visit (previously were not due to covid).  The increased talkativeness can occur at any time but more so at night and affects her sleep.    Past Medical History:  Diagnosis Date  . CAD (coronary artery disease)   . Chronic low back pain   . Contusion of foot, right 05/15/12  . Dementia (HCC) 10/16/2016   06/25/17 MMSE 18/30 passed clock  . Diverticulitis   . Dyslipidemia   . Dysphagia 12/11/2016  . H/O: hysterectomy   . HTN (hypertension)   . Memory disturbance 05/2012   MMSE 26/30, intact Clock test  . Nonischemic cardiomyopathy (HCC)   . Osteoporosis   . Pulmonary embolism (HCC)   . Right knee pain 05/15/12  . Spinal stenosis   . Ulcer disease   . Weight loss    Past Surgical History:  Procedure Laterality Date  . APPENDECTOMY  2010  . BACK SURGERY    . BIV ICD GENERTAOR CHANGE OUT N/A 05/24/2011   Procedure: BIV ICD GENERTAOR CHANGE OUT;  Surgeon: [name], MD;  Location: [facility] CATH LAB;  Service: Cardiovascular;  Laterality: N/A;  . CATARACT EXTRACTION    . COLON RESECTION  2005  . CORONARY ANGIOPLASTY WITH STENT PLACEMENT  01/2003  . HEMICOLECTOMY    . HIP ARTHROPLASTY Left 04/05/2014  Procedure: ARTHROPLASTY BIPOLAR HIP;  Surgeon: Shelda Pal, MD;  Location: [facility] ORS;  Service: Orthopedics;  Laterality: Left;  . left shoulder replacement  1991   Dr. [name]  . ORIF HIP FRACTURE Left 09/30/2016   Procedure: OPEN REDUCTION INTERNAL FIXATION PERIPROSTHETIC HIP FRACTURE;  Surgeon: Ollen Gross, MD;  Location: [facility] ORS;  Service: Orthopedics;  Laterality: Left;  . right shoulder repair  1985   Dr. [name]  . rotator cuff debridement  1999   Dr. [name]    Allergies  Allergen Reactions  . Arthrotec [Diclofenac-Misoprostol]   . Ibuprofen   . Lactose  Intolerance (Gi)   . Milk-Related Compounds Nausea And Vomiting    Cheese  . Other     Grass and ragweed   . Oxycontin [Oxycodone Hcl]   . Pollen Extract   . Verelan [Verapamil]     Outpatient Encounter Medications as of 07/02/2019  Medication Sig  . acetaminophen (TYLENOL) 325 MG tablet Take 650 mg by mouth 3 (three) times daily.   Marland Kitchen albuterol (PROVENTIL HFA;VENTOLIN HFA) 108 (90 Base) MCG/ACT inhaler Inhale 2 puffs into the lungs every 6 (six) hours as needed for wheezing or shortness of breath.  . allopurinol (ZYLOPRIM) 100 MG tablet Take 100 mg by mouth daily.  Marland Kitchen aspirin EC 81 MG tablet Take 81 mg by mouth daily.  . Carboxymethylcellul-Glycerin (OPTIVE) 0.5-0.9 % SOLN Apply 2 drops to eye 2 (two) times daily.  . carvedilol (COREG) 3.125 MG tablet Take 1.5625 mg by mouth 2 (two) times daily with a meal.   . cyanocobalamin 1000 MCG tablet Take 1,000 mcg by mouth daily.   Tery Sanfilippo Sodium 100 MG capsule Take 100 mg by mouth daily. May crush  . furosemide (LASIX) 20 MG tablet Take 20 mg by mouth daily.  Marland Kitchen losartan (COZAAR) 25 MG tablet Take 0.5 tablets (12.5 mg total) by mouth at bedtime.  . Nutritional Supplements (FEEDING SUPPLEMENT, BOOST BREEZE,) LIQD Take 1 Container by mouth in the morning and at bedtime.   Marland Kitchen omeprazole (PRILOSEC OTC) 20 MG tablet Take 20 mg by mouth 2 (two) times daily.  . polyethylene glycol (MIRALAX / GLYCOLAX) packet Take 17 g by mouth every other day. Hold for loose stools  . potassium chloride (KLOR-CON) 10 MEQ tablet Take 10 mEq by mouth daily.  . sertraline (ZOLOFT) 50 MG tablet Take 50 mg by mouth at bedtime.  . Zinc Oxide (DESITIN) 13 % CREA Apply topically as needed (after each bathroom use).   No facility-administered encounter medications on file as of 07/02/2019.    Review of Systems  Constitutional: Negative for activity change, appetite change, chills, diaphoresis, fatigue, fever and unexpected weight change.  HENT: Negative for congestion.     Respiratory: Negative for cough, shortness of breath and wheezing.   Cardiovascular: Negative for chest pain, palpitations and leg swelling.  Gastrointestinal: Negative for abdominal distention, abdominal pain, constipation and diarrhea.  Genitourinary: Negative for difficulty urinating and dysuria.  Musculoskeletal: Positive for arthralgias, gait problem and joint swelling. Negative for back pain and myalgias.  Neurological: Negative for dizziness, tremors, seizures, syncope, facial asymmetry, speech difficulty, weakness, light-headedness, numbness and headaches.  Psychiatric/Behavioral: Positive for confusion and sleep disturbance. Negative for agitation and behavioral problems. The patient is nervous/anxious and is hyperactive.     Immunization History  Administered Date(s) Administered  . Influenza Inj Mdck Quad Pf 01/05/2016  . Influenza, High Dose Seasonal PF 01/01/2019  . Influenza,inj,Quad PF,6+ Mos 01/07/2018  . Influenza-Unspecified 12/22/2014, 01/10/2017  .  Moderna SARS-COVID-2 Vaccination 03/24/2019, 04/21/2019  . Pneumococcal Conjugate-13 12/14/2013  . Pneumococcal Polysaccharide-23 03/29/2016  . Tdap 03/29/2016  . Zoster 11/03/2007  . Zoster Recombinat (Shingrix) 04/01/2017, 06/27/2017   Pertinent  Health Maintenance Due  Topic Date Due  . PNA vac Low Risk Adult  Completed  . DEXA SCAN  Discontinued   Fall Risk  07/02/2019 04/22/2018  Falls in the past year? 0 0  Number falls in past yr: 0 0  Injury with Fall? 0 0  Risk for fall due to : - Impaired mobility;Medication side effect;Mental status change;Impaired balance/gait  Follow up - Falls evaluation completed;Education provided  Comment - seen by therapy for all of this   Functional Status Survey:    Vitals:   07/02/19 1241  BP: (!) 92/46  Pulse: 84  Resp: (!) 22  Temp: 99 F (37.2 C)  SpO2: 90%   There is no height or weight on file to calculate BMI. Physical Exam Vitals and nursing note reviewed.   Constitutional:      General: She is not in acute distress.    Appearance: She is not diaphoretic.  HENT:     Head: Normocephalic and atraumatic.  Neck:     Vascular: No JVD.  Cardiovascular:     Rate and Rhythm: Normal rate and regular rhythm.     Heart sounds: No murmur.  Pulmonary:     Effort: Pulmonary effort is normal. No respiratory distress.     Breath sounds: Normal breath sounds. No wheezing.  Musculoskeletal:        General: Swelling (left knee) and tenderness (wiht palpation of the patella) present.     Right knee: Swelling and effusion present. No deformity, erythema, ecchymosis or lacerations. Decreased range of motion. Tenderness present.     Left knee: Normal.     Right lower leg: No edema.     Left lower leg: No edema.     Comments: Due to pain with ROM exam was limited  Skin:    General: Skin is warm and dry.  Neurological:     Mental Status: She is alert and oriented to person, place, and time.     Labs reviewed: Recent Labs    12/08/18 0000 04/02/19 0000  NA 140 144  K 4.7 4.6  CL  --  105  CO2  --  21  BUN 22* 26*  CREATININE 0.8 0.9  CALCIUM  --  9.4   No results for input(s): AST, ALT, ALKPHOS, BILITOT, PROT, ALBUMIN in the last 8760 hours. Recent Labs    04/02/19 0000  WBC 4.4  NEUTROABS 3  HGB 14.0  HCT 30*  PLT 157   Lab Results  Component Value Date   TSH 3.77 12/08/2018   No results found for: HGBA1C Lab Results  Component Value Date   CHOL 159 02/09/2017   HDL 49 02/09/2017   LDLCALC 96 02/09/2017   TRIG 69 02/09/2017   CHOLHDL 3.2 02/09/2017    Significant Diagnostic Results in last 30 days:  No results found.  Assessment/Plan  1. Acute pain of left knee Check xray.  If negative possibly could be due to gout. Would try colchcine if xray negative. Would avoid prednisone if possible due to her increased anxiety   2. Situational depression Possibly experiencing mania due to zoloft. Will decrease to 25 mg qhs. If no  improvement will d/c and try Remeron.  3. Insomnia due to medical condition Melatonin 5 mg qhs    Family/  staff Communication: discussed with her nurse Misti  Labs/tests ordered:  Left knee xray 3 view

## 2019-07-06 ENCOUNTER — Encounter: Payer: Self-pay | Admitting: Adult Health

## 2019-07-06 ENCOUNTER — Non-Acute Institutional Stay (SKILLED_NURSING_FACILITY): Payer: Medicare Other | Admitting: Adult Health

## 2019-07-06 DIAGNOSIS — M25562 Pain in left knee: Secondary | ICD-10-CM | POA: Diagnosis not present

## 2019-07-06 DIAGNOSIS — D649 Anemia, unspecified: Secondary | ICD-10-CM | POA: Diagnosis not present

## 2019-07-06 DIAGNOSIS — R410 Disorientation, unspecified: Secondary | ICD-10-CM

## 2019-07-06 DIAGNOSIS — F5101 Primary insomnia: Secondary | ICD-10-CM

## 2019-07-06 DIAGNOSIS — R609 Edema, unspecified: Secondary | ICD-10-CM | POA: Diagnosis not present

## 2019-07-06 DIAGNOSIS — Z79899 Other long term (current) drug therapy: Secondary | ICD-10-CM | POA: Diagnosis not present

## 2019-07-06 LAB — COMPREHENSIVE METABOLIC PANEL
Calcium: 8.5 — AB (ref 8.7–10.7)
Calcium: 8.5 — AB (ref 8.7–10.7)
GFR calc non Af Amer: 52

## 2019-07-06 LAB — PROTIME-INR: Protime: 63.7 — AB (ref 10.0–13.8)

## 2019-07-06 LAB — BASIC METABOLIC PANEL
BUN: 40 — AB (ref 4–21)
BUN: 40 — AB (ref 4–21)
CO2: 21 (ref 13–22)
CO2: 21 (ref 13–22)
Chloride: 105 (ref 99–108)
Chloride: 105 (ref 99–108)
Creatinine: 0.9 (ref 0.5–1.1)
Creatinine: 0.9 (ref 0.5–1.1)
Glucose: 107
Glucose: 107
Potassium: 4.1 (ref 3.4–5.3)
Potassium: 4.1 (ref 3.4–5.3)
Sodium: 140 (ref 137–147)
Sodium: 140 (ref 137–147)

## 2019-07-06 LAB — CBC AND DIFFERENTIAL
HCT: 32 — AB (ref 36–46)
HCT: 32 — AB (ref 36–46)
Hemoglobin: 10.1 — AB (ref 12.0–16.0)
Hemoglobin: 10.1 — AB (ref 12.0–16.0)
Platelets: 207 (ref 150–399)
Platelets: 207 (ref 150–399)
WBC: 10
WBC: 10

## 2019-07-06 LAB — CBC: RBC: 3.15 — AB (ref 3.87–5.11)

## 2019-07-06 LAB — POCT INR: INR: 5.3 — AB (ref <=1.1)

## 2019-07-06 NOTE — Progress Notes (Signed)
Location:  Medical illustrator of Service:  SNF (31) Provider:   [name], ANP [facility] [phone]   [name], DO  Patient Care Team: [name], DO as PCP - General (Geriatric Medicine) [name], MD as PCP - Cardiology (Cardiology) [name], NP as Nurse Practitioner (Nurse Practitioner)  Extended Emergency Contact Information Primary Emergency Contact: [name] Address: [address]          [place], [place] [address] [name] Home Phone: [phone] Relation: Other Secondary Emergency Contact: [name] [place] Home Phone: [phone] Relation: Daughter  Code Status:  DNR Goals of care: Advanced Directive information Advanced Directives 01/13/2019  Does Patient Have a Medical Advance Directive? Yes  Type of Advance Directive Out of facility DNR (pink MOST or yellow form);Healthcare Power of [place];Living will  Does patient want to make changes to medical advance directive? No - Patient declined  Copy of Healthcare Power of Attorney in Chart? Yes - validated most recent copy scanned in chart (See row information)  Pre-existing out of facility DNR order (yellow form or pink MOST form) Pink MOST form placed in chart (order not valid for inpatient use);Yellow form placed in chart (order not valid for inpatient use)     Chief Complaint  Patient presents with  . Acute Visit    left knee pain and swelling    HPI:  Pt is a 86 y.o. female seen today for an acute visit for un resolved left knee pain and swelling.   Ms. Faraci was seen for left knee pain and swelling on 4/15.  A knee xray was ordered which showed no acute fracture with hardware in alignment. She has a hx of left hip arthroplasty 2016.     Colchicine was prescribed on 4/15 but has not helped the pain and swelling. There is no redness, fever, or open areas.  There is a small reported  light purple area to the left knee noted by the nurse this weekend. She has dementia and is not able to contribute to the hx.  The nurse reports that she did not sleep well over the weekend and in the morning tends to be more confused due to this Zoloft was decreased last week due to periods of excessive talkativeness and anxiety. Melatonin was added due to lack of sleep. No changes in this issue thus far.     Past Medical History:  Diagnosis Date  . CAD (coronary artery disease)   . Chronic low back pain   . Contusion of foot, right 05/15/12  . Dementia (HCC) 10/16/2016   06/25/17 MMSE 18/30 passed clock  . Diverticulitis   . Dyslipidemia   . Dysphagia 12/11/2016  . H/O: hysterectomy   . HTN (hypertension)   . Memory disturbance 05/2012   MMSE 26/30, intact Clock test  . Nonischemic cardiomyopathy (HCC)   . Osteoporosis   . Pulmonary embolism (HCC)   . Right knee pain 05/15/12  . Spinal stenosis   . Ulcer disease   . Weight loss    Past Surgical History:  Procedure Laterality Date  . APPENDECTOMY  2010  . BACK SURGERY    . BIV ICD GENERTAOR CHANGE OUT N/A 05/24/2011   Procedure: BIV ICD GENERTAOR CHANGE OUT;  Surgeon: [name], MD;  Location: [facility] CATH LAB;  Service: Cardiovascular;  Laterality: N/A;  . CATARACT EXTRACTION    . COLON RESECTION  2005  .  CORONARY ANGIOPLASTY WITH STENT PLACEMENT  01/2003  . HEMICOLECTOMY    . HIP ARTHROPLASTY Left 04/05/2014   Procedure: ARTHROPLASTY BIPOLAR HIP;  Surgeon: Mauri Pole, MD;  Location: [facility];  Service: Orthopedics;  Laterality: Left;  . left shoulder replacement  1991   Dr. Collie Siad  . ORIF HIP FRACTURE Left 09/30/2016   Procedure: OPEN REDUCTION INTERNAL FIXATION PERIPROSTHETIC HIP FRACTURE;  Surgeon: Gaynelle Arabian, MD;  Location: [facility];  Service: Orthopedics;  Laterality: Left;  . right shoulder repair  1985   Dr. Collie Siad  . rotator cuff debridement  1999   Dr. Maureen Ralphs    Allergies  Allergen Reactions  . Arthrotec  [Diclofenac-Misoprostol]   . Ibuprofen   . Lactose Intolerance (Gi)   . Milk-Related Compounds Nausea And Vomiting    Cheese  . Other     Grass and ragweed   . Oxycontin [Oxycodone Hcl]   . Pollen Extract   . Verelan [Verapamil]     Outpatient Encounter Medications as of 07/06/2019  Medication Sig  . diclofenac Sodium (VOLTAREN) 1 % GEL Apply topically 4 (four) times daily.  Marland Kitchen acetaminophen (TYLENOL) 325 MG tablet Take 650 mg by mouth 3 (three) times daily.   Marland Kitchen albuterol (PROVENTIL HFA;VENTOLIN HFA) 108 (90 Base) MCG/ACT inhaler Inhale 2 puffs into the lungs every 6 (six) hours as needed for wheezing or shortness of breath.  . allopurinol (ZYLOPRIM) 100 MG tablet Take 100 mg by mouth daily.  Marland Kitchen aspirin EC 81 MG tablet Take 81 mg by mouth daily.  . Carboxymethylcellul-Glycerin (OPTIVE) 0.5-0.9 % SOLN Apply 2 drops to eye 2 (two) times daily.  . carvedilol (COREG) 3.125 MG tablet Take 1.5625 mg by mouth 2 (two) times daily with a meal.   . cyanocobalamin 1000 MCG tablet Take 1,000 mcg by mouth daily.   Mariane Baumgarten Sodium 100 MG capsule Take 100 mg by mouth daily. May crush  . furosemide (LASIX) 20 MG tablet Take 20 mg by mouth daily.  Marland Kitchen losartan (COZAAR) 25 MG tablet Take 0.5 tablets (12.5 mg total) by mouth at bedtime.  . Nutritional Supplements (FEEDING SUPPLEMENT, BOOST BREEZE,) LIQD Take 1 Container by mouth in the morning and at bedtime.   Marland Kitchen omeprazole (PRILOSEC OTC) 20 MG tablet Take 20 mg by mouth 2 (two) times daily.  . polyethylene glycol (MIRALAX / GLYCOLAX) packet Take 17 g by mouth every other day. Hold for loose stools  . potassium chloride (KLOR-CON) 10 MEQ tablet Take 10 mEq by mouth daily.  . sertraline (ZOLOFT) 50 MG tablet Take 50 mg by mouth at bedtime.  . Zinc Oxide (DESITIN) 13 % CREA Apply topically as needed (after each bathroom use).   No facility-administered encounter medications on file as of 07/06/2019.    Review of Systems  Unable to perform ROS: Dementia     Immunization History  Administered Date(s) Administered  . Influenza Inj Mdck Quad Pf 01/05/2016  . Influenza, High Dose Seasonal PF 01/01/2019  . Influenza,inj,Quad PF,6+ Mos 01/07/2018  . Influenza-Unspecified 12/22/2014, 01/10/2017  . Moderna SARS-COVID-2 Vaccination 03/24/2019, 04/21/2019  . Pneumococcal Conjugate-13 12/14/2013  . Pneumococcal Polysaccharide-23 03/29/2016  . Tdap 03/29/2016  . Zoster 11/03/2007  . Zoster Recombinat (Shingrix) 04/01/2017, 06/27/2017   Pertinent  Health Maintenance Due  Topic Date Due  . PNA vac Low Risk Adult  Completed  . DEXA SCAN  Discontinued   Fall Risk  07/02/2019 04/22/2018  Falls in the past year? 0 0  Number falls in past yr: 0  0  Injury with Fall? 0 0  Risk for fall due to : - Impaired mobility;Medication side effect;Mental status change;Impaired balance/gait  Follow up - Falls evaluation completed;Education provided  Comment - seen by therapy for all of this   Functional Status Survey:    There were no vitals filed for this visit. There is no height or weight on file to calculate BMI. Physical Exam Vitals and nursing note reviewed.  Constitutional:      General: She is not in acute distress.    Appearance: She is not diaphoretic.  HENT:     Head: Normocephalic and atraumatic.  Neck:     Vascular: No JVD.  Cardiovascular:     Rate and Rhythm: Normal rate and regular rhythm.     Heart sounds: No murmur.  Pulmonary:     Effort: Pulmonary effort is normal. No respiratory distress.     Breath sounds: Normal breath sounds. No wheezing.  Musculoskeletal:        General: Swelling (left knee) and tenderness present. No deformity or signs of injury.     Right lower leg: No edema.     Left lower leg: No edema.     Comments: Left knee and lower thigh with mild warmth, no redness and no open areas. Pain with any range of motion. Not TTP at the calf area, negative homans sign.   Skin:    General: Skin is warm and dry.      Comments: Left knee: lateral to the patella is small area blue/gray appearance, possible ecchymosis  Neurological:     Mental Status: She is alert.     Comments: Oriented to self but somewhat restless and agitated.      Labs reviewed: Recent Labs    12/08/18 0000 04/02/19 0000  NA 140 144  K 4.7 4.6  CL  --  105  CO2  --  21  BUN 22* 26*  CREATININE 0.8 0.9  CALCIUM  --  9.4   No results for input(s): AST, ALT, ALKPHOS, BILITOT, PROT, ALBUMIN in the last 8760 hours. Recent Labs    04/02/19 0000  WBC 4.4  NEUTROABS 3  HGB 14.0  HCT 30*  PLT 157   Lab Results  Component Value Date   TSH 3.77 12/08/2018   No results found for: HGBA1C Lab Results  Component Value Date   CHOL 159 02/09/2017   HDL 49 02/09/2017   LDLCALC 96 02/09/2017   TRIG 69 02/09/2017   CHOLHDL 3.2 02/09/2017    Significant Diagnostic Results in last 30 days:  No results found.  Assessment/Plan  1. Acute pain of left knee Her knee pain is acute and significant and did not respond to colchicine for gout. Will order doppler as there is some swelling in the lower thigh as well. She is not ambulatory (uses a hoyer lift) so there is little chance of injury. Could consider prednisone taper if doppler negative. Will try voltaren gel to the left knee QID for now. Also consider ortho referral given her surgical hx of no improvement, although we would like to avoid this since it is hard for her to get out of the facility. There is no redness and she is afebrile so infection suspicion is low on the differential but will order a CBC.  2. Delirium with dementia Possibly due to pain and lack of sleep, also has had progressive dementia over the past year. Will order labs to further differentiate. See #3  3. Primary  insomnia Would give the melatonin more time work and more time for the reduced dose of zoloft to take effect. Her POA suggested we try Cymbalta if this regimen did not work as this is what she was on  before she came to skilled care.   Family/ staff Communication: Discussed with her daughter n law Ms Brecklynn Jian  Labs/tests ordered:  CBC BMP venous doppler

## 2019-07-07 ENCOUNTER — Non-Acute Institutional Stay (SKILLED_NURSING_FACILITY): Payer: Medicare Other | Admitting: Internal Medicine

## 2019-07-07 ENCOUNTER — Encounter: Payer: Self-pay | Admitting: Internal Medicine

## 2019-07-07 DIAGNOSIS — M11262 Other chondrocalcinosis, left knee: Secondary | ICD-10-CM | POA: Diagnosis not present

## 2019-07-07 DIAGNOSIS — M1A09X0 Idiopathic chronic gout, multiple sites, without tophus (tophi): Secondary | ICD-10-CM

## 2019-07-07 DIAGNOSIS — R410 Disorientation, unspecified: Secondary | ICD-10-CM | POA: Diagnosis not present

## 2019-07-07 DIAGNOSIS — D649 Anemia, unspecified: Secondary | ICD-10-CM

## 2019-07-07 NOTE — Progress Notes (Signed)
Location:   [facility]  Place of Service:   SNF Provider:  Katrinna Travieso L. Renato Gails, D.O., C.M.D.  [name], DO  Patient Care Team: [name], DO as PCP - General (Geriatric Medicine) [name], MD as PCP - Cardiology (Cardiology) [name], NP as Nurse Practitioner (Nurse Practitioner)  Extended Emergency Contact Information Primary Emergency Contact: [name] Address: [address]          [place], [place] [address] [name] Home Phone: [phone] Relation: Other Secondary Emergency Contact: [name] [place] Home Phone: [phone] Relation: Daughter  Code Status:  DNR Goals of care: Advanced Directive information Advanced Directives 01/13/2019  Does Patient Have a Medical Advance Directive? Yes  Type of Advance Directive Out of facility DNR (pink MOST or yellow form);Healthcare Power of [place];Living will  Does patient want to make changes to medical advance directive? No - Patient declined  Copy of Healthcare Power of Attorney in Chart? Yes - validated most recent copy scanned in chart (See row information)  Pre-existing out of facility DNR order (yellow form or pink MOST form) Pink MOST form placed in chart (order not valid for inpatient use);Yellow form placed in chart (order not valid for inpatient use)     Chief Complaint  Patient presents with  . Medical Management of Chronic Issues    medical mgt  . Acute Visit    left knee pain    HPI:  Pt is a 86 y.o. female seen today for medical management of chronic diseases and acutely for continued left knee pain.  Pain began last week and xrays were done that indicated no acute fx or effusion present.  Pt was initially given two doses of colchicine for possible gout or pseudogout.  Steroids were considered but in view of her confusion and periods of manic-type behavior, this was avoided.  Pain persisted and pt was seen again yesterday for it  and doppler was ordered to r/o DVT (completed this am).  Pt continues to yell out if the left leg is moved and was in too much pain to be lifted from bed today.  She's been getting voltaren gel on the knee.  She has scheduled tylenol, as well.    When labs were done to assess for infection in the knee, wbc was in normal range, but her hgb had dropped 3 points in 3 mos.  Of note, the Jan value was amid covid and may have been hemoconcentrated.  Looking at those before, hgb ranged in 11-12 range and is now in 10s.  Hemoccults are pending--she's not had a bm since they were ordered late yesterday.    She has been getting upset and anxious during transfers for a bit now (since recovering from covid) and not sleeping well at night.  She is on a bit less zoloft now and her melatonin was increased.  She had been on prozac before and family reported to nursing that she had done better on that at one point (typically it's very stimulating and not used in older adults).     We are monitoring her and seeing how she does on the zoloft/melatonin combo at this point.  Past Medical History:  Diagnosis Date  . CAD (coronary artery disease)   . Chronic low back pain   . Contusion of foot, right 05/15/12  . Dementia (HCC) 10/16/2016   06/25/17 MMSE 18/30 passed clock  . Diverticulitis   . Dyslipidemia   .  Dysphagia 12/11/2016  . H/O: hysterectomy   . HTN (hypertension)   . Memory disturbance 05/2012   MMSE 26/30, intact Clock test  . Nonischemic cardiomyopathy (HCC)   . Osteoporosis   . Pulmonary embolism (HCC)   . Right knee pain 05/15/12  . Spinal stenosis   . Ulcer disease   . Weight loss    Past Surgical History:  Procedure Laterality Date  . APPENDECTOMY  2010  . BACK SURGERY    . BIV ICD GENERTAOR CHANGE OUT N/A 05/24/2011   Procedure: BIV ICD GENERTAOR CHANGE OUT;  Surgeon: [name], MD;  Location: [facility] CATH LAB;  Service: Cardiovascular;  Laterality: N/A;  . CATARACT EXTRACTION    . COLON  RESECTION  2005  . CORONARY ANGIOPLASTY WITH STENT PLACEMENT  01/2003  . HEMICOLECTOMY    . HIP ARTHROPLASTY Left 04/05/2014   Procedure: ARTHROPLASTY BIPOLAR HIP;  Surgeon: Shelda Pal, MD;  Location: [facility];  Service: Orthopedics;  Laterality: Left;  . left shoulder replacement  1991   Dr. [name]  . ORIF HIP FRACTURE Left 09/30/2016   Procedure: OPEN REDUCTION INTERNAL FIXATION PERIPROSTHETIC HIP FRACTURE;  Surgeon: Ollen Gross, MD;  Location: [facility];  Service: Orthopedics;  Laterality: Left;  . right shoulder repair  1985   Dr. [name]  . rotator cuff debridement  1999   Dr. [name]    Allergies  Allergen Reactions  . Arthrotec [Diclofenac-Misoprostol]   . Ibuprofen   . Lactose Intolerance (Gi)   . Milk-Related Compounds Nausea And Vomiting    Cheese  . Other     Grass and ragweed   . Oxycontin [Oxycodone Hcl]   . Pollen Extract   . Verelan [Verapamil]     Outpatient Encounter Medications as of 07/07/2019  Medication Sig  . acetaminophen (TYLENOL) 325 MG tablet Take 650 mg by mouth 3 (three) times daily.   Marland Kitchen albuterol (PROVENTIL HFA;VENTOLIN HFA) 108 (90 Base) MCG/ACT inhaler Inhale 2 puffs into the lungs every 6 (six) hours as needed for wheezing or shortness of breath.  . allopurinol (ZYLOPRIM) 100 MG tablet Take 100 mg by mouth daily.  Marland Kitchen aspirin EC 81 MG tablet Take 81 mg by mouth daily.  . Carboxymethylcellul-Glycerin (OPTIVE) 0.5-0.9 % SOLN Apply 2 drops to eye 2 (two) times daily.  . carvedilol (COREG) 3.125 MG tablet Take 1.5625 mg by mouth 2 (two) times daily with a meal.   . cyanocobalamin 1000 MCG tablet Take 1,000 mcg by mouth daily.   . diclofenac Sodium (VOLTAREN) 1 % GEL Apply topically 4 (four) times daily.  Tery Sanfilippo Sodium 100 MG capsule Take 100 mg by mouth daily. May crush  . furosemide (LASIX) 20 MG tablet Take 20 mg by mouth daily.  Marland Kitchen losartan (COZAAR) 25 MG tablet Take 0.5 tablets (12.5 mg total) by mouth at bedtime.  . melatonin 5 MG TABS Take 5 mg by  mouth at bedtime.  . Nutritional Supplements (FEEDING SUPPLEMENT, BOOST BREEZE,) LIQD Take 1 Container by mouth in the morning and at bedtime.   Marland Kitchen omeprazole (PRILOSEC OTC) 20 MG tablet Take 20 mg by mouth 2 (two) times daily.  . polyethylene glycol (MIRALAX / GLYCOLAX) packet Take 17 g by mouth every other day. Hold for loose stools  . potassium chloride (KLOR-CON) 10 MEQ tablet Take 10 mEq by mouth daily.  . sertraline (ZOLOFT) 50 MG tablet Take 25 mg by mouth at bedtime.   . Zinc Oxide (DESITIN) 13 % CREA Apply topically as needed (  after each bathroom use).   No facility-administered encounter medications on file as of 07/07/2019.    Review of Systems  Constitutional: Positive for malaise/fatigue. Negative for chills and fever.  HENT: Positive for hearing loss.   Respiratory: Negative for shortness of breath.   Cardiovascular: Positive for leg swelling. Negative for chest pain.  Gastrointestinal: Negative for abdominal pain.  Genitourinary: Negative for dysuria.  Musculoskeletal: Positive for joint pain. Negative for falls.  Skin: Negative for itching and rash.  Neurological: Negative for dizziness and loss of consciousness.  Psychiatric/Behavioral: Positive for memory loss.    Immunization History  Administered Date(s) Administered  . Influenza Inj Mdck Quad Pf 01/05/2016  . Influenza, High Dose Seasonal PF 01/01/2019  . Influenza,inj,Quad PF,6+ Mos 01/07/2018  . Influenza-Unspecified 12/22/2014, 01/10/2017  . Moderna SARS-COVID-2 Vaccination 03/24/2019, 04/21/2019  . Pneumococcal Conjugate-13 12/14/2013  . Pneumococcal Polysaccharide-23 03/29/2016  . Tdap 03/29/2016  . Zoster 11/03/2007  . Zoster Recombinat (Shingrix) 04/01/2017, 06/27/2017   Pertinent  Health Maintenance Due  Topic Date Due  . PNA vac Low Risk Adult  Completed  . DEXA SCAN  Discontinued   Fall Risk  07/02/2019 04/22/2018  Falls in the past year? 0 0  Number falls in past yr: 0 0  Injury with Fall? 0 0    Risk for fall due to : - Impaired mobility;Medication side effect;Mental status change;Impaired balance/gait  Follow up - Falls evaluation completed;Education provided  Comment - seen by therapy for all of this   Functional Status Survey:    Vitals:   07/07/19 1440  BP: 131/74  Pulse: 76  Resp: 20  Temp: 97.7 F (36.5 C)  SpO2: 94%  Weight: 151 lb 6.4 oz (68.7 kg)  Height: 5\' 5"  (1.651 m)   Body mass index is 25.19 kg/m. Physical Exam Vitals reviewed.  Constitutional:      Appearance: Normal appearance.  HENT:     Head: Normocephalic and atraumatic.  Cardiovascular:     Rate and Rhythm: Normal rate and regular rhythm.     Pulses: Normal pulses.     Heart sounds: Normal heart sounds.  Pulmonary:     Effort: Pulmonary effort is normal.     Breath sounds: Normal breath sounds. No rales.  Abdominal:     General: Bowel sounds are normal.     Palpations: Abdomen is soft.  Musculoskeletal:     Comments: Left knee with chronic deformity, generalized swelling, warmth, tenderness, but no erythema; yells out if it's moved and when staff tried to get her up out of bed this am so she's resting in bed for today  Skin:    General: Skin is warm and dry.  Neurological:     Mental Status: She is alert. She is disoriented.  Psychiatric:     Comments: Drowsy, awakens briefly and goes back to sleep     Labs reviewed: Recent Labs    12/08/18 0000 04/02/19 0000  NA 140 144  K 4.7 4.6  CL  --  105  CO2  --  21  BUN 22* 26*  CREATININE 0.8 0.9  CALCIUM  --  9.4   No results for input(s): AST, ALT, ALKPHOS, BILITOT, PROT, ALBUMIN in the last 8760 hours. Recent Labs    04/02/19 0000  WBC 4.4  NEUTROABS 3  HGB 14.0  HCT 30*  PLT 157   Lab Results  Component Value Date   TSH 6.46 (A) 06/08/2019   No results found for: HGBA1C Lab Results  Component Value Date   CHOL 159 02/09/2017   HDL 49 02/09/2017   LDLCALC 96 02/09/2017   TRIG 69 02/09/2017   CHOLHDL 3.2  02/09/2017     Assessment/Plan 1. Pseudogout of left knee -suspect -had gotten initial two doses of colchicine but not continued so will try continuing this until resolution to see if she has improvement in pain, could also use ice/cold compresses -xrays negative for fx, doppler negative for DVT, continue voltaren gel also  2. Delirium with dementia -suspect secondary to her pain at this point so want to avoid adding to this with prednisone  3. Idiopathic chronic gout of multiple sites without tophus -does not typically involve knees, but colchicine will also treat this anyway  4.  Anemia -hgb dropped 3pt since last check, but last one was higher than priors so not clear that this is a legitimate new issue -will still doe hemoccult x3 in case since her mental status has been so altered lately  Family/ staff Communication: -discussed with home care, snf nurse and manager  Labs/tests ordered:  No new added today  Hopland L. Grayer Sproles, D.O. [facility] [address], [place] [address] Cell Phone (Mon-Fri 8am-5pm):  [phone] On Call:  [phone] & follow prompts after 5pm & weekends Office Phone:  [phone] Office Fax:  [phone]

## 2019-07-12 MED ORDER — COLCHICINE 0.6 MG PO TABS: 0.6000 mg | ORAL_TABLET | Freq: Every day | ORAL | 0 refills | Status: DC

## 2019-07-20 ENCOUNTER — Encounter: Payer: Self-pay | Admitting: Internal Medicine

## 2019-07-31 ENCOUNTER — Encounter: Payer: Self-pay | Admitting: Adult Health

## 2019-07-31 ENCOUNTER — Non-Acute Institutional Stay (SKILLED_NURSING_FACILITY): Payer: Medicare Other | Admitting: Adult Health

## 2019-07-31 DIAGNOSIS — D638 Anemia in other chronic diseases classified elsewhere: Secondary | ICD-10-CM

## 2019-07-31 DIAGNOSIS — I5022 Chronic systolic (congestive) heart failure: Secondary | ICD-10-CM

## 2019-07-31 DIAGNOSIS — K5901 Slow transit constipation: Secondary | ICD-10-CM | POA: Diagnosis not present

## 2019-07-31 DIAGNOSIS — M11262 Other chondrocalcinosis, left knee: Secondary | ICD-10-CM

## 2019-07-31 DIAGNOSIS — F4321 Adjustment disorder with depressed mood: Secondary | ICD-10-CM

## 2019-07-31 DIAGNOSIS — F5101 Primary insomnia: Secondary | ICD-10-CM | POA: Diagnosis not present

## 2019-07-31 NOTE — Progress Notes (Signed)
Location:  [facility] of Service:  SNF (31) Provider:   Cindi Carbon, ANP [place] [phone]   Gayland Curry, DO  Patient Care Team: Gayland Curry, DO as PCP - General (Geriatric Medicine) Evans Lance, MD as PCP - Cardiology (Cardiology) Royal Hawthorn, NP as Nurse Practitioner (Nurse Practitioner)  Extended Emergency Contact Information Primary Emergency Contact: Lynn Ito Address: [address]          [place], [place] [address] [place] Phone: [phone] Relation: Other Secondary Emergency Contact: [place] Phone: [phone] Relation: Daughter  Code Status:  DNR Goals of care: Advanced Directive information Advanced Directives 01/13/2019  Does Patient Have a Medical Advance Directive? Yes  Type of Advance Directive Out of facility DNR (pink MOST or yellow form);[place];Living will  Does patient want to make changes to medical advance directive? No - Patient declined  Copy of [place] in Chart? Yes - validated most recent copy scanned in chart (See row information)  Pre-existing out of facility DNR order (yellow form or pink MOST form) Pink MOST form placed in chart (order not valid for inpatient use);Yellow form placed in chart (order not valid for inpatient use)     Chief Complaint  Patient presents with  . Medical Management of Chronic Issues    HPI:  Pt is a 86 y.o. female seen today for medical management of chronic diseases.    Anemia: Hgb 10.1 on 4/19, was 14 on 1/14.  Heme negative x 3.  Prior Hgb running 11-12 in years prior. No reports of bleeding.   Left knee pain: improved per staff with no swelling or redness. Using a hoyer lift for transfers  No reports of issues sleeping, currently taking melatonin. She is sleeping more during the day. Memory loss is progressing. MMSE 10/30  07/18/19  OT has been consulted for a more supportive chair  Her mood is improved per staff. Nursing notes do not indicate any issues with crying or anxiety. Her caregiver reports her appetite is good today, weight is stable  Wt Readings from Last 3 Encounters:  07/31/19 151 lb 6.4 oz (68.7 kg)  07/07/19 151 lb 6.4 oz (68.7 kg)  06/30/19 151 lb (68.5 kg)    No issues with increased edema, weight gain, sob, doe, pnd   Past Medical History:  Diagnosis Date  . CAD (coronary artery disease)   . Chronic low back pain   . Contusion of foot, right 05/15/12  . Dementia ([place]) 10/16/2016   06/25/17 MMSE 18/30 passed clock  . Diverticulitis   . Dyslipidemia   . Dysphagia 12/11/2016  . H/O: hysterectomy   . HTN (hypertension)   . Memory disturbance 05/2012   MMSE 26/30, intact Clock test  . Nonischemic cardiomyopathy ([place])   . Osteoporosis   . Pulmonary embolism ([place])   . Right knee pain 05/15/12  . Spinal stenosis   . Ulcer disease   . Weight loss    Past Surgical History:  Procedure Laterality Date  . APPENDECTOMY  2010  . BACK SURGERY    . BIV ICD GENERTAOR CHANGE OUT N/A 05/24/2011   Procedure: BIV ICD GENERTAOR CHANGE OUT;  Surgeon: Evans Lance, MD;  Location: [facility] CATH LAB;  Service: Cardiovascular;  Laterality: N/A;  . CATARACT EXTRACTION    . COLON RESECTION  2005  . CORONARY ANGIOPLASTY WITH STENT PLACEMENT  01/2003  .  HEMICOLECTOMY    . HIP ARTHROPLASTY Left 04/05/2014   Procedure: ARTHROPLASTY BIPOLAR HIP;  Surgeon: Shelda Pal, MD;  Location: [facility];  Service: Orthopedics;  Laterality: Left;  . left shoulder replacement  1991   Dr. [name]  . ORIF HIP FRACTURE Left 09/30/2016   Procedure: OPEN REDUCTION INTERNAL FIXATION PERIPROSTHETIC HIP FRACTURE;  Surgeon: Ollen Gross, MD;  Location: [facility];  Service: Orthopedics;  Laterality: Left;  . right shoulder repair  1985   Dr. [name]  . rotator cuff debridement  1999   Dr. [name]    Allergies  Allergen Reactions  . Arthrotec  [Diclofenac-Misoprostol]   . Ibuprofen   . Lactose Intolerance (Gi)   . Milk-Related Compounds Nausea And Vomiting    Cheese  . Other     Grass and ragweed   . Oxycontin [Oxycodone Hcl]   . Pollen Extract   . Verelan [Verapamil]     Outpatient Encounter Medications as of 07/31/2019  Medication Sig  . acetaminophen (TYLENOL) 325 MG tablet Take 650 mg by mouth 3 (three) times daily.   Marland Kitchen albuterol (PROVENTIL HFA;VENTOLIN HFA) 108 (90 Base) MCG/ACT inhaler Inhale 2 puffs into the lungs every 6 (six) hours as needed for wheezing or shortness of breath.  . allopurinol (ZYLOPRIM) 100 MG tablet Take 100 mg by mouth daily.  Marland Kitchen aspirin EC 81 MG tablet Take 81 mg by mouth daily.  . Carboxymethylcellul-Glycerin (OPTIVE) 0.5-0.9 % SOLN Apply 2 drops to eye 2 (two) times daily.  . carvedilol (COREG) 3.125 MG tablet Take 1.5625 mg by mouth 2 (two) times daily with a meal.   . cyanocobalamin 1000 MCG tablet Take 1,000 mcg by mouth daily.   . diclofenac Sodium (VOLTAREN) 1 % GEL Apply topically 4 (four) times daily.  Tery Sanfilippo Sodium 100 MG capsule Take 100 mg by mouth daily. May crush  . furosemide (LASIX) 20 MG tablet Take 20 mg by mouth daily.  Marland Kitchen losartan (COZAAR) 25 MG tablet Take 0.5 tablets (12.5 mg total) by mouth at bedtime.  . melatonin 5 MG TABS Take 5 mg by mouth at bedtime.  . Nutritional Supplements (FEEDING SUPPLEMENT, BOOST BREEZE,) LIQD Take 1 Container by mouth in the morning and at bedtime.   Marland Kitchen omeprazole (PRILOSEC OTC) 20 MG tablet Take 20 mg by mouth 2 (two) times daily.  . polyethylene glycol (MIRALAX / GLYCOLAX) packet Take 17 g by mouth every other day. Hold for loose stools  . potassium chloride (KLOR-CON) 10 MEQ tablet Take 10 mEq by mouth daily.  . sertraline (ZOLOFT) 50 MG tablet Take 25 mg by mouth at bedtime.   . Zinc Oxide (DESITIN) 13 % CREA Apply topically as needed (after each bathroom use).  . [DISCONTINUED] colchicine 0.6 MG tablet Take 1 tablet (0.6 mg total) by  mouth daily. Until resolved   No facility-administered encounter medications on file as of 07/31/2019.    Review of Systems  Unable to perform ROS: Dementia    Immunization History  Administered Date(s) Administered  . Influenza Inj Mdck Quad Pf 01/05/2016  . Influenza, High Dose Seasonal PF 01/01/2019  . Influenza,inj,Quad PF,6+ Mos 01/07/2018  . Influenza-Unspecified 12/22/2014, 01/10/2017  . Moderna SARS-COVID-2 Vaccination 03/24/2019, 04/21/2019  . Pneumococcal Conjugate-13 12/14/2013  . Pneumococcal Polysaccharide-23 03/29/2016  . Tdap 03/29/2016  . Zoster 11/03/2007  . Zoster Recombinat (Shingrix) 04/01/2017, 06/27/2017   Pertinent  Health Maintenance Due  Topic Date Due  . PNA vac Low Risk Adult  Completed  . DEXA SCAN  Discontinued   Fall Risk  07/02/2019 04/22/2018  Falls in the past year? 0 0  Number falls in past yr: 0 0  Injury with Fall? 0 0  Risk for fall due to : - Impaired mobility;Medication side effect;Mental status change;Impaired balance/gait  Follow up - Falls evaluation completed;Education provided  Comment - seen by therapy for all of this   Functional Status Survey:    Vitals:   07/31/19 1001  Weight: 151 lb 6.4 oz (68.7 kg)   Body mass index is 25.19 kg/m. Physical Exam Vitals and nursing note reviewed.  Constitutional:      General: She is not in acute distress.    Appearance: She is not diaphoretic.  HENT:     Head: Normocephalic and atraumatic.     Mouth/Throat:     Mouth: Mucous membranes are moist.     Pharynx: Oropharynx is clear. No oropharyngeal exudate.  Neck:     Vascular: No JVD.  Cardiovascular:     Rate and Rhythm: Normal rate and regular rhythm.     Heart sounds: No murmur.  Pulmonary:     Effort: Pulmonary effort is normal. No respiratory distress.     Breath sounds: Normal breath sounds. No wheezing.  Abdominal:     General: Bowel sounds are normal.     Palpations: Abdomen is soft.  Musculoskeletal:        General:  No swelling, tenderness, deformity or signs of injury.     Right lower leg: No edema.     Left lower leg: No edema.  Skin:    General: Skin is warm and dry.  Neurological:     Mental Status: She is alert.     Comments: Oriented to self only. Pleasant and able to f/c and answer q's     Labs reviewed: Recent Labs    12/08/18 0000 04/02/19 0000 07/06/19 0000  NA 140 144 140  140  K 4.7 4.6 4.1  4.1  CL  --  105 105  105  CO2  --  21 21  21   BUN 22* 26* 40*  40*  CREATININE 0.8 0.9 0.9  0.9  CALCIUM  --  9.4 8.5*  8.5*   No results for input(s): AST, ALT, ALKPHOS, BILITOT, PROT, ALBUMIN in the last 8760 hours. Recent Labs    04/02/19 0000 07/06/19 0000  WBC 4.4 10.0  10.0  NEUTROABS 3  --   HGB 14.0 10.1*  10.1*  HCT 30* 32*  32*  PLT 157 207  207   Lab Results  Component Value Date   TSH 6.46 (A) 06/08/2019   No results found for: HGBA1C Lab Results  Component Value Date   CHOL 159 02/09/2017   HDL 49 02/09/2017   LDLCALC 96 02/09/2017   TRIG 69 02/09/2017   CHOLHDL 3.2 02/09/2017    Significant Diagnostic Results in last 30 days:  No results found.  Assessment/Plan 1. Pseudogout of left knee Improved after complete of colchicine. Will continue scheduled voltaren gel and allopurinol 100 mg qd   2. Primary insomnia Improved, continue melatonin 5 mg qhs   3. Slow transit constipation Controlled, continue miralax qod and colace 100 mg qd   4. Situational depression Improved, continue zoloft 25 mg qhs   5. Chronic systolic heart failure (HCC) Compensated.  Continue Lasix 20 m g qd and low dose losartan.   6. Anemia Likely due to chronic disease  Continue to monitor.   Family/ staff Communication: discussed with  her nruse  Labs/tests ordered:  NA

## 2019-08-10 DIAGNOSIS — M6389 Disorders of muscle in diseases classified elsewhere, multiple sites: Secondary | ICD-10-CM | POA: Diagnosis not present

## 2019-08-10 DIAGNOSIS — R293 Abnormal posture: Secondary | ICD-10-CM | POA: Diagnosis not present

## 2019-08-10 DIAGNOSIS — F0150 Vascular dementia without behavioral disturbance: Secondary | ICD-10-CM | POA: Diagnosis not present

## 2019-08-10 DIAGNOSIS — R278 Other lack of coordination: Secondary | ICD-10-CM | POA: Diagnosis not present

## 2019-08-11 DIAGNOSIS — F0150 Vascular dementia without behavioral disturbance: Secondary | ICD-10-CM | POA: Diagnosis not present

## 2019-08-11 DIAGNOSIS — R278 Other lack of coordination: Secondary | ICD-10-CM | POA: Diagnosis not present

## 2019-08-11 DIAGNOSIS — M6389 Disorders of muscle in diseases classified elsewhere, multiple sites: Secondary | ICD-10-CM | POA: Diagnosis not present

## 2019-08-11 DIAGNOSIS — R293 Abnormal posture: Secondary | ICD-10-CM | POA: Diagnosis not present

## 2019-08-12 DIAGNOSIS — R293 Abnormal posture: Secondary | ICD-10-CM | POA: Diagnosis not present

## 2019-08-12 DIAGNOSIS — R278 Other lack of coordination: Secondary | ICD-10-CM | POA: Diagnosis not present

## 2019-08-12 DIAGNOSIS — M6389 Disorders of muscle in diseases classified elsewhere, multiple sites: Secondary | ICD-10-CM | POA: Diagnosis not present

## 2019-08-12 DIAGNOSIS — F0150 Vascular dementia without behavioral disturbance: Secondary | ICD-10-CM | POA: Diagnosis not present

## 2019-08-20 ENCOUNTER — Ambulatory Visit (INDEPENDENT_AMBULATORY_CARE_PROVIDER_SITE_OTHER): Payer: Medicare Other | Admitting: *Deleted

## 2019-08-20 DIAGNOSIS — I428 Other cardiomyopathies: Secondary | ICD-10-CM

## 2019-08-21 DIAGNOSIS — M6389 Disorders of muscle in diseases classified elsewhere, multiple sites: Secondary | ICD-10-CM | POA: Diagnosis not present

## 2019-08-21 DIAGNOSIS — R278 Other lack of coordination: Secondary | ICD-10-CM | POA: Diagnosis not present

## 2019-08-21 DIAGNOSIS — R293 Abnormal posture: Secondary | ICD-10-CM | POA: Diagnosis not present

## 2019-08-21 DIAGNOSIS — F0150 Vascular dementia without behavioral disturbance: Secondary | ICD-10-CM | POA: Diagnosis not present

## 2019-08-21 LAB — CUP PACEART REMOTE DEVICE CHECK
Battery Impedance: 1854 Ohm
Battery Remaining Longevity: 31 mo
Battery Voltage: 2.75 V
Brady Statistic AP VP Percent: 58 %
Brady Statistic AP VS Percent: 0 %
Brady Statistic AS VP Percent: 42 %
Brady Statistic AS VS Percent: 0 %
Date Time Interrogation Session: 20210603192055
Implantable Lead Implant Date: 20071005
Implantable Lead Implant Date: 20071005
Implantable Lead Location: 753858
Implantable Lead Location: 753859
Implantable Lead Model: 4194
Implantable Lead Model: 5076
Implantable Pulse Generator Implant Date: 20130307
Lead Channel Impedance Value: 409 Ohm
Lead Channel Impedance Value: 953 Ohm
Lead Channel Pacing Threshold Amplitude: 0.75 V
Lead Channel Pacing Threshold Amplitude: 1 V
Lead Channel Pacing Threshold Pulse Width: 0.4 ms
Lead Channel Pacing Threshold Pulse Width: 0.4 ms
Lead Channel Setting Pacing Amplitude: 2 V
Lead Channel Setting Pacing Amplitude: 2.5 V
Lead Channel Setting Pacing Pulse Width: 0.4 ms
Lead Channel Setting Sensing Sensitivity: 4 mV

## 2019-08-25 NOTE — Progress Notes (Signed)
Remote pacemaker transmission.   

## 2019-08-27 ENCOUNTER — Encounter: Payer: Self-pay | Admitting: Adult Health

## 2019-08-27 ENCOUNTER — Non-Acute Institutional Stay (SKILLED_NURSING_FACILITY): Payer: Medicare Other | Admitting: Adult Health

## 2019-08-27 DIAGNOSIS — F0390 Unspecified dementia without behavioral disturbance: Secondary | ICD-10-CM

## 2019-08-27 DIAGNOSIS — D638 Anemia in other chronic diseases classified elsewhere: Secondary | ICD-10-CM

## 2019-08-27 DIAGNOSIS — F4321 Adjustment disorder with depressed mood: Secondary | ICD-10-CM | POA: Diagnosis not present

## 2019-08-27 DIAGNOSIS — M6389 Disorders of muscle in diseases classified elsewhere, multiple sites: Secondary | ICD-10-CM | POA: Diagnosis not present

## 2019-08-27 DIAGNOSIS — I495 Sick sinus syndrome: Secondary | ICD-10-CM

## 2019-08-27 DIAGNOSIS — M11262 Other chondrocalcinosis, left knee: Secondary | ICD-10-CM

## 2019-08-27 DIAGNOSIS — R278 Other lack of coordination: Secondary | ICD-10-CM | POA: Diagnosis not present

## 2019-08-27 DIAGNOSIS — R293 Abnormal posture: Secondary | ICD-10-CM | POA: Diagnosis not present

## 2019-08-27 DIAGNOSIS — F0150 Vascular dementia without behavioral disturbance: Secondary | ICD-10-CM | POA: Diagnosis not present

## 2019-08-27 NOTE — Progress Notes (Signed)
Location:  Occupational psychologist of Service:  SNF (31) Provider:   Cindi Carbon, ANP [place] [phone]   Gayland Curry, DO  Patient Care Team: Gayland Curry, DO as PCP - General (Geriatric Medicine) Evans Lance, MD as PCP - Cardiology (Cardiology) Royal Hawthorn, NP as Nurse Practitioner (Nurse Practitioner)  Extended Emergency Contact Information Primary Emergency Contact: Lynn Ito Address: [address]          [place], [place] [address] [place] Phone: [phone] Relation: Other Secondary Emergency Contact: [place] Phone: [phone] Relation: Daughter  Code Status:  DNR Goals of care: Advanced Directive information Advanced Directives 01/13/2019  Does Patient Have a Medical Advance Directive? Yes  Type of Advance Directive Out of facility DNR (pink MOST or yellow form);[place];Living will  Does patient want to make changes to medical advance directive? No - Patient declined  Copy of [place] in Chart? Yes - validated most recent copy scanned in chart (See row information)  Pre-existing out of facility DNR order (yellow form or pink MOST form) Pink MOST form placed in chart (order not valid for inpatient use);Yellow form placed in chart (order not valid for inpatient use)     Chief Complaint  Patient presents with  . Medical Management of Chronic Issues    HPI:  Pt is a 86 y.o. female seen today for medical management of chronic diseases.  She has progressive dementia and resides in skilled care at [facility]. There are no acute concerns regarding her care. She was treated for left knee and possible pseudogout with colchcine and voltaren gel in April which improved and then the voltaren gel was changed to as needed. During my visit her caretaker reports that she is not typically in pain when sitting but  can grimace during bed mobility, as she is no longer ambulatory. She is being treated for situational depression with Zoloft 25 mg and there are no current symptoms or behaviors of concern in our visit. Her weight is fairly stable. She has a pacemaker due to SSS and remote transmission was recently performed.   Wt Readings from Last 3 Encounters:  08/27/19 149 lb (67.6 kg)  07/31/19 151 lb 6.4 oz (68.7 kg)  07/07/19 151 lb 6.4 oz (68.7 kg)    Past Medical History:  Diagnosis Date  . CAD (coronary artery disease)   . Chronic low back pain   . Contusion of foot, right 05/15/12  . Dementia ([place]) 10/16/2016   06/25/17 MMSE 18/30 passed clock  . Diverticulitis   . Dyslipidemia   . Dysphagia 12/11/2016  . H/O: hysterectomy   . HTN (hypertension)   . Memory disturbance 05/2012   MMSE 26/30, intact Clock test  . Nonischemic cardiomyopathy ([place])   . Osteoporosis   . Pulmonary embolism ([place])   . Right knee pain 05/15/12  . Spinal stenosis   . Ulcer disease   . Weight loss    Past Surgical History:  Procedure Laterality Date  . APPENDECTOMY  2010  . BACK SURGERY    . BIV ICD GENERTAOR CHANGE OUT N/A 05/24/2011   Procedure: BIV ICD GENERTAOR CHANGE OUT;  Surgeon: Evans Lance, MD;  Location: [facility] CATH LAB;  Service: Cardiovascular;  Laterality: N/A;  . CATARACT EXTRACTION    . COLON RESECTION  2005  . CORONARY ANGIOPLASTY WITH STENT PLACEMENT  01/2003  . HEMICOLECTOMY    .  HIP ARTHROPLASTY Left 04/05/2014   Procedure: ARTHROPLASTY BIPOLAR HIP;  Surgeon: Shelda Pal, MD;  Location: [facility] ORS;  Service: Orthopedics;  Laterality: Left;  . left shoulder replacement  1991   Dr. [name]  . ORIF HIP FRACTURE Left 09/30/2016   Procedure: OPEN REDUCTION INTERNAL FIXATION PERIPROSTHETIC HIP FRACTURE;  Surgeon: Ollen Gross, MD;  Location: [facility] ORS;  Service: Orthopedics;  Laterality: Left;  . right shoulder repair  1985   Dr. [name]  . rotator cuff debridement  1999   Dr. [name]    Allergies  Allergen  Reactions  . Arthrotec [Diclofenac-Misoprostol]   . Ibuprofen   . Lactose Intolerance (Gi)   . Milk-Related Compounds Nausea And Vomiting    Cheese  . Other     Grass and ragweed   . Oxycontin [Oxycodone Hcl]   . Pollen Extract   . Verelan [Verapamil]     Outpatient Encounter Medications as of 08/27/2019  Medication Sig  . acetaminophen (TYLENOL) 325 MG tablet Take 650 mg by mouth 3 (three) times daily.   Marland Kitchen albuterol (PROVENTIL HFA;VENTOLIN HFA) 108 (90 Base) MCG/ACT inhaler Inhale 2 puffs into the lungs every 6 (six) hours as needed for wheezing or shortness of breath.  . allopurinol (ZYLOPRIM) 100 MG tablet Take 100 mg by mouth daily.  Marland Kitchen aspirin EC 81 MG tablet Take 81 mg by mouth daily.  . Carboxymethylcellul-Glycerin (OPTIVE) 0.5-0.9 % SOLN Apply 2 drops to eye 2 (two) times daily.  . carvedilol (COREG) 3.125 MG tablet Take 1.5625 mg by mouth 2 (two) times daily with a meal.   . cyanocobalamin 1000 MCG tablet Take 1,000 mcg by mouth daily.   . diclofenac Sodium (VOLTAREN) 1 % GEL Apply topically 4 (four) times daily.  Tery Sanfilippo Sodium 100 MG capsule Take 100 mg by mouth daily. May crush  . furosemide (LASIX) 20 MG tablet Take 20 mg by mouth daily.  Marland Kitchen losartan (COZAAR) 25 MG tablet Take 0.5 tablets (12.5 mg total) by mouth at bedtime.  . melatonin 5 MG TABS Take 5 mg by mouth at bedtime.  . Nutritional Supplements (FEEDING SUPPLEMENT, BOOST BREEZE,) LIQD Take 1 Container by mouth in the morning and at bedtime.   Marland Kitchen omeprazole (PRILOSEC OTC) 20 MG tablet Take 20 mg by mouth 2 (two) times daily.  . polyethylene glycol (MIRALAX / GLYCOLAX) packet Take 17 g by mouth every other day. Hold for loose stools  . potassium chloride (KLOR-CON) 10 MEQ tablet Take 10 mEq by mouth daily.  . sertraline (ZOLOFT) 50 MG tablet Take 25 mg by mouth at bedtime.   . Zinc Oxide (DESITIN) 13 % CREA Apply topically as needed (after each bathroom use).   No facility-administered encounter medications on  file as of 08/27/2019.    Review of Systems  Constitutional: Negative for activity change, appetite change, chills, diaphoresis, fatigue, fever and unexpected weight change.  HENT: Negative for congestion.   Respiratory: Negative for cough, shortness of breath and wheezing.   Cardiovascular: Negative for chest pain, palpitations and leg swelling.  Gastrointestinal: Negative for abdominal distention, abdominal pain, constipation and diarrhea.  Genitourinary: Negative for difficulty urinating and dysuria.  Musculoskeletal: Positive for arthralgias and joint swelling. Negative for back pain, gait problem and myalgias.  Skin: Negative for rash.  Neurological: Positive for weakness. Negative for dizziness, tremors, seizures, syncope, facial asymmetry, speech difficulty, light-headedness, numbness and headaches.  Psychiatric/Behavioral: Positive for confusion. Negative for agitation and behavioral problems.    Immunization History  Administered Date(s) Administered  .  Influenza Inj Mdck Quad Pf 01/05/2016  . Influenza, High Dose Seasonal PF 01/01/2019  . Influenza,inj,Quad PF,6+ Mos 01/07/2018  . Influenza-Unspecified 12/22/2014, 01/10/2017  . Moderna SARS-COVID-2 Vaccination 03/24/2019, 04/21/2019  . Pneumococcal Conjugate-13 12/14/2013  . Pneumococcal Polysaccharide-23 03/29/2016  . Tdap 03/29/2016  . Zoster 11/03/2007  . Zoster Recombinat (Shingrix) 04/01/2017, 06/27/2017   Pertinent  Health Maintenance Due  Topic Date Due  . PNA vac Low Risk Adult  Completed  . DEXA SCAN  Discontinued   Fall Risk  07/02/2019 04/22/2018  Falls in the past year? 0 0  Number falls in past yr: 0 0  Injury with Fall? 0 0  Risk for fall due to : - Impaired mobility;Medication side effect;Mental status change;Impaired balance/gait  Follow up - Falls evaluation completed;Education provided  Comment - seen by therapy for all of this   Functional Status Survey:    Vitals:   08/27/19 0957  Weight: 149  lb (67.6 kg)   Body mass index is 24.79 kg/m. Physical Exam Vitals and nursing note reviewed.  Constitutional:      General: She is not in acute distress.    Appearance: She is not diaphoretic.  HENT:     Head: Normocephalic and atraumatic.     Mouth/Throat:     Mouth: Mucous membranes are moist.     Pharynx: Oropharynx is clear. No oropharyngeal exudate.  Neck:     Vascular: No JVD.  Cardiovascular:     Rate and Rhythm: Normal rate and regular rhythm.     Heart sounds: No murmur heard.   Pulmonary:     Effort: Pulmonary effort is normal. No respiratory distress.     Breath sounds: Normal breath sounds. No wheezing.  Abdominal:     General: Bowel sounds are normal. There is no distension.  Musculoskeletal:        General: Swelling (left knee) present. No tenderness, deformity or signs of injury.     Cervical back: No rigidity or tenderness.     Right lower leg: No edema.     Left lower leg: No edema.     Comments: Left knee no pain with ROM. No crepitus, neg anterior draw test. Left knee does have swelling, erythema, and warmth.   Lymphadenopathy:     Cervical: No cervical adenopathy.  Skin:    General: Skin is warm and dry.     Findings: Erythema (to left knee) present.  Neurological:     General: No focal deficit present.     Mental Status: She is alert. Mental status is at baseline.  Psychiatric:        Mood and Affect: Mood normal.     Labs reviewed: Recent Labs    12/08/18 0000 04/02/19 0000 07/06/19 0000  NA 140 144 140  140  K 4.7 4.6 4.1  4.1  CL  --  105 105  105  CO2  --  21 21  21   BUN 22* 26* 40*  40*  CREATININE 0.8 0.9 0.9  0.9  CALCIUM  --  9.4 8.5*  8.5*   No results for input(s): AST, ALT, ALKPHOS, BILITOT, PROT, ALBUMIN in the last 8760 hours. Recent Labs    04/02/19 0000 07/06/19 0000  WBC 4.4 10.0  10.0  NEUTROABS 3  --   HGB 14.0 10.1*  10.1*  HCT 30* 32*  32*  PLT 157 207  207   Lab Results  Component Value Date    TSH 6.46 (A) 06/08/2019  No results found for: HGBA1C Lab Results  Component Value Date   CHOL 159 02/09/2017   HDL 49 02/09/2017   LDLCALC 96 02/09/2017   TRIG 69 02/09/2017   CHOLHDL 3.2 02/09/2017    Significant Diagnostic Results in last 30 days:  CUP PACEART REMOTE DEVICE CHECK  Result Date: 08/21/2019 Scheduled remote reviewed. Normal device function. 1 AMS 1 minute.  Next remote 91 days. [place]   Assessment/Plan 1. Pseudogout of knee, left Reviewed xray of left knee which showed no CPPD or fracture. Symptoms are c/w gout but she does have hardware to the left knee (but no fever or pain). Goals of care are comfort based. Will check Uric acid. Continue allopurinol and restart  voltaren gel back to 4 grams qid scheduled   2. Dementia without behavioral disturbance, unspecified dementia type (HCC) Severe with assistance needed in all ADLs but remains pleasant and able to communicate. Continue supportive care only.   3. Anemia of chronic disease Lab Results  Component Value Date   HGB 10.1 (A) 07/06/2019   HGB 10.1 (A) 07/06/2019  Continue to monitor at this time.    4. Situational depression Improved with lower dose of Zoloft 25 mg qhs, 50 mg caused too much excitatory symptoms   5. Sick sinus syndrome [facility]) S/p pacemaker with remote transmission documented.      Family/ staff Communication: discussed with her nurse  Labs/tests ordered:  Uric acid

## 2019-08-28 DIAGNOSIS — R293 Abnormal posture: Secondary | ICD-10-CM | POA: Diagnosis not present

## 2019-08-28 DIAGNOSIS — R278 Other lack of coordination: Secondary | ICD-10-CM | POA: Diagnosis not present

## 2019-08-28 DIAGNOSIS — M6389 Disorders of muscle in diseases classified elsewhere, multiple sites: Secondary | ICD-10-CM | POA: Diagnosis not present

## 2019-08-28 DIAGNOSIS — F0150 Vascular dementia without behavioral disturbance: Secondary | ICD-10-CM | POA: Diagnosis not present

## 2019-08-28 DIAGNOSIS — E785 Hyperlipidemia, unspecified: Secondary | ICD-10-CM | POA: Diagnosis not present

## 2019-08-28 DIAGNOSIS — M1A09X0 Idiopathic chronic gout, multiple sites, without tophus (tophi): Secondary | ICD-10-CM | POA: Diagnosis not present

## 2019-08-28 LAB — URIC ACID: Uric Acid: 4.4

## 2019-08-31 DIAGNOSIS — M6389 Disorders of muscle in diseases classified elsewhere, multiple sites: Secondary | ICD-10-CM | POA: Diagnosis not present

## 2019-08-31 DIAGNOSIS — R293 Abnormal posture: Secondary | ICD-10-CM | POA: Diagnosis not present

## 2019-08-31 DIAGNOSIS — F0150 Vascular dementia without behavioral disturbance: Secondary | ICD-10-CM | POA: Diagnosis not present

## 2019-08-31 DIAGNOSIS — R278 Other lack of coordination: Secondary | ICD-10-CM | POA: Diagnosis not present

## 2019-09-02 DIAGNOSIS — R293 Abnormal posture: Secondary | ICD-10-CM | POA: Diagnosis not present

## 2019-09-02 DIAGNOSIS — R278 Other lack of coordination: Secondary | ICD-10-CM | POA: Diagnosis not present

## 2019-09-02 DIAGNOSIS — M6389 Disorders of muscle in diseases classified elsewhere, multiple sites: Secondary | ICD-10-CM | POA: Diagnosis not present

## 2019-09-02 DIAGNOSIS — F0150 Vascular dementia without behavioral disturbance: Secondary | ICD-10-CM | POA: Diagnosis not present

## 2019-09-04 DIAGNOSIS — F0150 Vascular dementia without behavioral disturbance: Secondary | ICD-10-CM | POA: Diagnosis not present

## 2019-09-04 DIAGNOSIS — M6389 Disorders of muscle in diseases classified elsewhere, multiple sites: Secondary | ICD-10-CM | POA: Diagnosis not present

## 2019-09-04 DIAGNOSIS — R293 Abnormal posture: Secondary | ICD-10-CM | POA: Diagnosis not present

## 2019-09-04 DIAGNOSIS — R278 Other lack of coordination: Secondary | ICD-10-CM | POA: Diagnosis not present

## 2019-09-06 DIAGNOSIS — R293 Abnormal posture: Secondary | ICD-10-CM | POA: Diagnosis not present

## 2019-09-06 DIAGNOSIS — M6389 Disorders of muscle in diseases classified elsewhere, multiple sites: Secondary | ICD-10-CM | POA: Diagnosis not present

## 2019-09-06 DIAGNOSIS — R278 Other lack of coordination: Secondary | ICD-10-CM | POA: Diagnosis not present

## 2019-09-06 DIAGNOSIS — F0150 Vascular dementia without behavioral disturbance: Secondary | ICD-10-CM | POA: Diagnosis not present

## 2019-09-07 DIAGNOSIS — F0150 Vascular dementia without behavioral disturbance: Secondary | ICD-10-CM | POA: Diagnosis not present

## 2019-09-07 DIAGNOSIS — R278 Other lack of coordination: Secondary | ICD-10-CM | POA: Diagnosis not present

## 2019-09-07 DIAGNOSIS — R293 Abnormal posture: Secondary | ICD-10-CM | POA: Diagnosis not present

## 2019-09-07 DIAGNOSIS — M6389 Disorders of muscle in diseases classified elsewhere, multiple sites: Secondary | ICD-10-CM | POA: Diagnosis not present

## 2019-09-08 DIAGNOSIS — M6389 Disorders of muscle in diseases classified elsewhere, multiple sites: Secondary | ICD-10-CM | POA: Diagnosis not present

## 2019-09-08 DIAGNOSIS — R278 Other lack of coordination: Secondary | ICD-10-CM | POA: Diagnosis not present

## 2019-09-08 DIAGNOSIS — F0150 Vascular dementia without behavioral disturbance: Secondary | ICD-10-CM | POA: Diagnosis not present

## 2019-09-08 DIAGNOSIS — R293 Abnormal posture: Secondary | ICD-10-CM | POA: Diagnosis not present

## 2019-09-09 DIAGNOSIS — R278 Other lack of coordination: Secondary | ICD-10-CM | POA: Diagnosis not present

## 2019-09-09 DIAGNOSIS — R293 Abnormal posture: Secondary | ICD-10-CM | POA: Diagnosis not present

## 2019-09-09 DIAGNOSIS — M6389 Disorders of muscle in diseases classified elsewhere, multiple sites: Secondary | ICD-10-CM | POA: Diagnosis not present

## 2019-09-09 DIAGNOSIS — F0150 Vascular dementia without behavioral disturbance: Secondary | ICD-10-CM | POA: Diagnosis not present

## 2019-09-15 DIAGNOSIS — F0150 Vascular dementia without behavioral disturbance: Secondary | ICD-10-CM | POA: Diagnosis not present

## 2019-09-15 DIAGNOSIS — M6389 Disorders of muscle in diseases classified elsewhere, multiple sites: Secondary | ICD-10-CM | POA: Diagnosis not present

## 2019-09-15 DIAGNOSIS — R293 Abnormal posture: Secondary | ICD-10-CM | POA: Diagnosis not present

## 2019-09-15 DIAGNOSIS — R278 Other lack of coordination: Secondary | ICD-10-CM | POA: Diagnosis not present

## 2019-09-16 DIAGNOSIS — F0150 Vascular dementia without behavioral disturbance: Secondary | ICD-10-CM | POA: Diagnosis not present

## 2019-09-16 DIAGNOSIS — R293 Abnormal posture: Secondary | ICD-10-CM | POA: Diagnosis not present

## 2019-09-16 DIAGNOSIS — R278 Other lack of coordination: Secondary | ICD-10-CM | POA: Diagnosis not present

## 2019-09-16 DIAGNOSIS — M6389 Disorders of muscle in diseases classified elsewhere, multiple sites: Secondary | ICD-10-CM | POA: Diagnosis not present

## 2019-09-17 DIAGNOSIS — F0150 Vascular dementia without behavioral disturbance: Secondary | ICD-10-CM | POA: Diagnosis not present

## 2019-09-17 DIAGNOSIS — M6389 Disorders of muscle in diseases classified elsewhere, multiple sites: Secondary | ICD-10-CM | POA: Diagnosis not present

## 2019-09-17 DIAGNOSIS — R293 Abnormal posture: Secondary | ICD-10-CM | POA: Diagnosis not present

## 2019-09-17 DIAGNOSIS — R278 Other lack of coordination: Secondary | ICD-10-CM | POA: Diagnosis not present

## 2019-09-18 DIAGNOSIS — M6389 Disorders of muscle in diseases classified elsewhere, multiple sites: Secondary | ICD-10-CM | POA: Diagnosis not present

## 2019-09-18 DIAGNOSIS — R293 Abnormal posture: Secondary | ICD-10-CM | POA: Diagnosis not present

## 2019-09-18 DIAGNOSIS — F0150 Vascular dementia without behavioral disturbance: Secondary | ICD-10-CM | POA: Diagnosis not present

## 2019-09-18 DIAGNOSIS — R278 Other lack of coordination: Secondary | ICD-10-CM | POA: Diagnosis not present

## 2019-09-21 DIAGNOSIS — R293 Abnormal posture: Secondary | ICD-10-CM | POA: Diagnosis not present

## 2019-09-21 DIAGNOSIS — R278 Other lack of coordination: Secondary | ICD-10-CM | POA: Diagnosis not present

## 2019-09-21 DIAGNOSIS — F0150 Vascular dementia without behavioral disturbance: Secondary | ICD-10-CM | POA: Diagnosis not present

## 2019-09-21 DIAGNOSIS — M6389 Disorders of muscle in diseases classified elsewhere, multiple sites: Secondary | ICD-10-CM | POA: Diagnosis not present

## 2019-09-22 DIAGNOSIS — R293 Abnormal posture: Secondary | ICD-10-CM | POA: Diagnosis not present

## 2019-09-22 DIAGNOSIS — F0150 Vascular dementia without behavioral disturbance: Secondary | ICD-10-CM | POA: Diagnosis not present

## 2019-09-22 DIAGNOSIS — M6389 Disorders of muscle in diseases classified elsewhere, multiple sites: Secondary | ICD-10-CM | POA: Diagnosis not present

## 2019-09-22 DIAGNOSIS — R278 Other lack of coordination: Secondary | ICD-10-CM | POA: Diagnosis not present

## 2019-09-23 ENCOUNTER — Encounter: Payer: Self-pay | Admitting: Internal Medicine

## 2019-09-23 DIAGNOSIS — F0150 Vascular dementia without behavioral disturbance: Secondary | ICD-10-CM | POA: Diagnosis not present

## 2019-09-23 DIAGNOSIS — R278 Other lack of coordination: Secondary | ICD-10-CM | POA: Diagnosis not present

## 2019-09-23 DIAGNOSIS — M6389 Disorders of muscle in diseases classified elsewhere, multiple sites: Secondary | ICD-10-CM | POA: Diagnosis not present

## 2019-09-23 DIAGNOSIS — R293 Abnormal posture: Secondary | ICD-10-CM | POA: Diagnosis not present

## 2019-10-02 DIAGNOSIS — R278 Other lack of coordination: Secondary | ICD-10-CM | POA: Diagnosis not present

## 2019-10-02 DIAGNOSIS — M6389 Disorders of muscle in diseases classified elsewhere, multiple sites: Secondary | ICD-10-CM | POA: Diagnosis not present

## 2019-10-02 DIAGNOSIS — R293 Abnormal posture: Secondary | ICD-10-CM | POA: Diagnosis not present

## 2019-10-02 DIAGNOSIS — F0150 Vascular dementia without behavioral disturbance: Secondary | ICD-10-CM | POA: Diagnosis not present

## 2019-10-23 ENCOUNTER — Non-Acute Institutional Stay (SKILLED_NURSING_FACILITY): Payer: Medicare Other | Admitting: Adult Health

## 2019-10-23 ENCOUNTER — Encounter: Payer: Self-pay | Admitting: Adult Health

## 2019-10-23 DIAGNOSIS — I5022 Chronic systolic (congestive) heart failure: Secondary | ICD-10-CM | POA: Diagnosis not present

## 2019-10-23 DIAGNOSIS — M1A09X0 Idiopathic chronic gout, multiple sites, without tophus (tophi): Secondary | ICD-10-CM | POA: Diagnosis not present

## 2019-10-23 DIAGNOSIS — G301 Alzheimer's disease with late onset: Secondary | ICD-10-CM

## 2019-10-23 DIAGNOSIS — F4321 Adjustment disorder with depressed mood: Secondary | ICD-10-CM

## 2019-10-23 DIAGNOSIS — R1310 Dysphagia, unspecified: Secondary | ICD-10-CM

## 2019-10-23 DIAGNOSIS — F0280 Dementia in other diseases classified elsewhere without behavioral disturbance: Secondary | ICD-10-CM

## 2019-10-23 NOTE — Progress Notes (Signed)
Location:  Medical illustrator of Service:  SNF (31) Provider:   [name], ANP [facility] [phone]   [name], DO  Patient Care Team: [name], DO as PCP - General (Geriatric Medicine) [name], MD as PCP - Cardiology (Cardiology) [name], NP as Nurse Practitioner (Nurse Practitioner)  Extended Emergency Contact Information Primary Emergency Contact: [name] Address: [address]          [place], [place] [address] [name] Home Phone: [phone] Relation: Other Secondary Emergency Contact: [name] [place] Home Phone: [phone] Relation: Daughter  Code Status:  DNR Goals of care: Advanced Directive information Advanced Directives 10/24/2019  Does Patient Have a Medical Advance Directive? Yes  Type of Estate agent of [place];Living will;Out of facility DNR (pink MOST or yellow form)  Does patient want to make changes to medical advance directive? -  Copy of Healthcare Power of Attorney in Chart? Yes - validated most recent copy scanned in chart (See row information)  Pre-existing out of facility DNR order (yellow form or pink MOST form) Yellow form placed in chart (order not valid for inpatient use);Pink MOST form placed in chart (order not valid for inpatient use)     Chief Complaint  Patient presents with  . Medical Management of Chronic Issues    HPI:  Pt is a 86 y.o. female seen today for medical management of chronic diseases.  Ms. Kabler as AD and resides in skilled care. On 8/4 the nurses notes indicate that she was lethargic but later aroused. ON 8/5 she had an episode of vomiting but appeared to be in no acute distress. For my visit today she is alert, feeling well, and vitals were checked and are stable. She ate 25% of breakfast which is typical for her as she has a small appetite. She denies any abd pain,  nausea, vomiting, or discomfort. LBM 8/5.  She has had covid and has been vaccinated for covid.  She has pseudogout/gout but no current issues of joint pain or swelling. Uric acid was 4.4   CHF: no issues with sob, doe, weight gain etc  Depression/anxiety: less tearful episodes. Occasionally excessively talks and appears anxious per nurse.     Past Medical History:  Diagnosis Date  . CAD (coronary artery disease)   . Chronic low back pain   . Contusion of foot, right 05/15/12  . Dementia (HCC) 10/16/2016   06/25/17 MMSE 18/30 passed clock  . Diverticulitis   . Dyslipidemia   . Dysphagia 12/11/2016  . H/O: hysterectomy   . HTN (hypertension)   . Memory disturbance 05/2012   MMSE 26/30, intact Clock test  . Nonischemic cardiomyopathy (HCC)   . Osteoporosis   . Pulmonary embolism (HCC)   . Right knee pain 05/15/12  . Spinal stenosis   . Ulcer disease   . Weight loss    Past Surgical History:  Procedure Laterality Date  . APPENDECTOMY  2010  . BACK SURGERY    . BIV ICD GENERTAOR CHANGE OUT N/A 05/24/2011   Procedure: BIV ICD GENERTAOR CHANGE OUT;  Surgeon: [name], MD;  Location: [facility] CATH LAB;  Service: Cardiovascular;  Laterality: N/A;  . CATARACT EXTRACTION    . COLON RESECTION  2005  . CORONARY ANGIOPLASTY WITH STENT PLACEMENT  01/2003  . HEMICOLECTOMY    . HIP ARTHROPLASTY Left 04/05/2014   Procedure: ARTHROPLASTY BIPOLAR HIP;  Surgeon:  Shelda Pal, MD;  Location: [facility] ORS;  Service: Orthopedics;  Laterality: Left;  . left shoulder replacement  1991   Dr. [name]  . ORIF HIP FRACTURE Left 09/30/2016   Procedure: OPEN REDUCTION INTERNAL FIXATION PERIPROSTHETIC HIP FRACTURE;  Surgeon: Ollen Gross, MD;  Location: [facility] ORS;  Service: Orthopedics;  Laterality: Left;  . right shoulder repair  1985   Dr. [name]  . rotator cuff debridement  1999   Dr. [name]    Allergies  Allergen Reactions  . Arthrotec [Diclofenac-Misoprostol]   . Ibuprofen   . Lactose Intolerance (Gi)   .  Milk-Related Compounds Nausea And Vomiting    Cheese  . Other     Grass and ragweed   . Oxycontin [Oxycodone Hcl]   . Pollen Extract   . Verelan [Verapamil]     Outpatient Encounter Medications as of 10/23/2019  Medication Sig  . acetaminophen (TYLENOL) 325 MG tablet Take 650 mg by mouth 3 (three) times daily.   Marland Kitchen albuterol (PROVENTIL HFA;VENTOLIN HFA) 108 (90 Base) MCG/ACT inhaler Inhale 2 puffs into the lungs every 6 (six) hours as needed for wheezing or shortness of breath.  . allopurinol (ZYLOPRIM) 100 MG tablet Take 100 mg by mouth daily.  Marland Kitchen aspirin EC 81 MG tablet Take 81 mg by mouth daily.  . Carboxymethylcellul-Glycerin (OPTIVE) 0.5-0.9 % SOLN Apply 2 drops to eye 2 (two) times daily.  . carvedilol (COREG) 3.125 MG tablet Take 1.5625 mg by mouth 2 (two) times daily with a meal.   . cyanocobalamin 1000 MCG tablet Take 1,000 mcg by mouth daily.   . diclofenac Sodium (VOLTAREN) 1 % GEL Apply 4 g topically 4 (four) times daily.   Tery Sanfilippo Sodium 100 MG capsule Take 100 mg by mouth daily. May crush  . furosemide (LASIX) 20 MG tablet Take 20 mg by mouth daily.  Marland Kitchen losartan (COZAAR) 25 MG tablet Take 0.5 tablets (12.5 mg total) by mouth at bedtime.  . melatonin 5 MG TABS Take 5 mg by mouth at bedtime.  . Nutritional Supplements (FEEDING SUPPLEMENT, BOOST BREEZE,) LIQD Take 1 Container by mouth in the morning and at bedtime.   Marland Kitchen omeprazole (PRILOSEC OTC) 20 MG tablet Take 20 mg by mouth 2 (two) times daily.  . polyethylene glycol (MIRALAX / GLYCOLAX) packet Take 17 g by mouth every other day. Hold for loose stools  . potassium chloride (KLOR-CON) 10 MEQ tablet Take 10 mEq by mouth daily.  . sertraline (ZOLOFT) 50 MG tablet Take 25 mg by mouth at bedtime.   . Zinc Oxide (DESITIN) 13 % CREA Apply topically as needed (after each bathroom use).   No facility-administered encounter medications on file as of 10/23/2019.    Review of Systems  Constitutional: Negative for activity change,  appetite change, chills, diaphoresis, fatigue, fever and unexpected weight change.  HENT: Negative for congestion.   Respiratory: Negative for cough, shortness of breath and wheezing.   Cardiovascular: Negative for chest pain, palpitations and leg swelling.  Gastrointestinal: Negative for abdominal distention, abdominal pain, constipation and diarrhea.  Genitourinary: Negative for difficulty urinating and dysuria.  Musculoskeletal: Positive for arthralgias and gait problem. Negative for back pain, joint swelling and myalgias.  Skin: Negative for rash.  Neurological: Positive for weakness. Negative for dizziness, tremors, seizures, syncope, facial asymmetry, speech difficulty, light-headedness, numbness and headaches.  Psychiatric/Behavioral: Positive for confusion. Negative for agitation, behavioral problems, dysphoric mood, hallucinations, self-injury and sleep disturbance. The patient is nervous/anxious. The patient is not hyperactive.  Immunization History  Administered Date(s) Administered  . Influenza Inj Mdck Quad Pf 01/05/2016  . Influenza, High Dose Seasonal PF 01/01/2019  . Influenza,inj,Quad PF,6+ Mos 01/07/2018  . Influenza-Unspecified 12/22/2014, 01/10/2017  . Moderna SARS-COVID-2 Vaccination 03/24/2019, 04/21/2019  . Pneumococcal Conjugate-13 12/14/2013  . Pneumococcal Polysaccharide-23 03/29/2016  . Tdap 03/29/2016  . Zoster 11/03/2007  . Zoster Recombinat (Shingrix) 04/01/2017, 06/27/2017   Pertinent  Health Maintenance Due  Topic Date Due  . PNA vac Low Risk Adult  Completed  . DEXA SCAN  Discontinued   Fall Risk  07/02/2019 04/22/2018  Falls in the past year? 0 0  Number falls in past yr: 0 0  Injury with Fall? 0 0  Risk for fall due to : - Impaired mobility;Medication side effect;Mental status change;Impaired balance/gait  Follow up - Falls evaluation completed;Education provided  Comment - seen by therapy for all of this   Functional Status Survey:     Vitals:   10/23/19 1034  BP: 98/64  Pulse: 70  Resp: 18  Temp: (!) 97.1 F (36.2 C)  SpO2: 98%  Weight: 149 lb 6.4 oz (67.8 kg)   Body mass index is 24.86 kg/m. Physical Exam Vitals and nursing note reviewed.  Constitutional:      General: She is not in acute distress.    Appearance: She is not diaphoretic.  HENT:     Head: Normocephalic and atraumatic.     Mouth/Throat:     Mouth: Mucous membranes are moist.     Pharynx: Oropharynx is clear. No oropharyngeal exudate.  Neck:     Vascular: No JVD.  Cardiovascular:     Rate and Rhythm: Normal rate and regular rhythm.     Heart sounds: No murmur heard.   Pulmonary:     Effort: Pulmonary effort is normal. No respiratory distress.     Breath sounds: Normal breath sounds. No wheezing.  Abdominal:     General: Bowel sounds are normal. There is no distension.  Musculoskeletal:        General: No swelling, tenderness, deformity or signs of injury.     Cervical back: No rigidity or tenderness.     Right lower leg: No edema.     Left lower leg: No edema.  Lymphadenopathy:     Cervical: No cervical adenopathy.  Skin:    General: Skin is warm and dry.     Findings: No erythema.  Neurological:     General: No focal deficit present.     Mental Status: She is alert. Mental status is at baseline.  Psychiatric:        Mood and Affect: Mood normal.     Labs reviewed: Recent Labs    12/08/18 0000 04/02/19 0000 07/06/19 0000  NA 140 144 140  140  K 4.7 4.6 4.1  4.1  CL  --  105 105  105  CO2  --  21 21  21   BUN 22* 26* 40*  40*  CREATININE 0.8 0.9 0.9  0.9  CALCIUM  --  9.4 8.5*  8.5*   No results for input(s): AST, ALT, ALKPHOS, BILITOT, PROT, ALBUMIN in the last 8760 hours. Recent Labs    04/02/19 0000 07/06/19 0000  WBC 4.4 10.0  10.0  NEUTROABS 3  --   HGB 14.0 10.1*  10.1*  HCT 30* 32*  32*  PLT 157 207  207   Lab Results  Component Value Date   TSH 6.46 (A) 06/08/2019   No results found  for: HGBA1C Lab Results  Component Value Date   CHOL 159 02/09/2017   HDL 49 02/09/2017   LDLCALC 96 02/09/2017   TRIG 69 02/09/2017   CHOLHDL 3.2 02/09/2017    Significant Diagnostic Results in last 30 days:  No results found.  Assessment/Plan  1. Late onset Alzheimer's disease without behavioral disturbance (HCC) Severe Continue supportive care in the skilled environment   2. Dysphagia, unspecified type No current issues. At risk for aspiration. Should eat upright at all times. Continue regular diet with pureed meats  3. Chronic systolic heart failure (HCC) Compensated Continue Lasix 20 mg qd   4. Idiopathic chronic gout of multiple sites without tophus No new issues Continue Allopurinol 100mg  qd and Tylenol 650 mg tid   5. Situational depression Mood is labile at times but generally better than earlier this year. Will continue Zoloft at 25 mg qd    Family/ staff Communication: discussed with her nurse  Labs/tests ordered:  NA

## 2019-11-02 DIAGNOSIS — Z9189 Other specified personal risk factors, not elsewhere classified: Secondary | ICD-10-CM | POA: Diagnosis not present

## 2019-11-02 DIAGNOSIS — Z20828 Contact with and (suspected) exposure to other viral communicable diseases: Secondary | ICD-10-CM | POA: Diagnosis not present

## 2019-11-09 DIAGNOSIS — Z20828 Contact with and (suspected) exposure to other viral communicable diseases: Secondary | ICD-10-CM | POA: Diagnosis not present

## 2019-11-09 DIAGNOSIS — Z9189 Other specified personal risk factors, not elsewhere classified: Secondary | ICD-10-CM | POA: Diagnosis not present

## 2019-11-19 ENCOUNTER — Ambulatory Visit (INDEPENDENT_AMBULATORY_CARE_PROVIDER_SITE_OTHER): Payer: Medicare Other | Admitting: *Deleted

## 2019-11-19 DIAGNOSIS — I428 Other cardiomyopathies: Secondary | ICD-10-CM

## 2019-11-20 ENCOUNTER — Encounter: Payer: Self-pay | Admitting: Adult Health

## 2019-11-20 LAB — CUP PACEART REMOTE DEVICE CHECK
Battery Impedance: 2218 Ohm
Battery Remaining Longevity: 23 mo
Battery Voltage: 2.73 V
Brady Statistic AP VP Percent: 60 %
Brady Statistic AP VS Percent: 0 %
Brady Statistic AS VP Percent: 39 %
Brady Statistic AS VS Percent: 0 %
Date Time Interrogation Session: 20210902124053
Implantable Lead Implant Date: 20071005
Implantable Lead Implant Date: 20071005
Implantable Lead Location: 753858
Implantable Lead Location: 753859
Implantable Lead Model: 4194
Implantable Lead Model: 5076
Implantable Pulse Generator Implant Date: 20130307
Lead Channel Impedance Value: 415 Ohm
Lead Channel Impedance Value: 855 Ohm
Lead Channel Pacing Threshold Amplitude: 0.75 V
Lead Channel Pacing Threshold Amplitude: 1.5 V
Lead Channel Pacing Threshold Pulse Width: 0.4 ms
Lead Channel Pacing Threshold Pulse Width: 0.4 ms
Lead Channel Setting Pacing Amplitude: 2 V
Lead Channel Setting Pacing Amplitude: 3.5 V
Lead Channel Setting Pacing Pulse Width: 0.4 ms
Lead Channel Setting Sensing Sensitivity: 4 mV

## 2019-11-20 NOTE — Progress Notes (Signed)
This encounter was created in error - please disregard.

## 2019-11-24 NOTE — Progress Notes (Signed)
Remote pacemaker transmission.   

## 2019-11-26 ENCOUNTER — Non-Acute Institutional Stay (SKILLED_NURSING_FACILITY): Payer: Medicare Other | Admitting: Adult Health

## 2019-11-26 ENCOUNTER — Encounter: Payer: Self-pay | Admitting: Adult Health

## 2019-11-26 DIAGNOSIS — F4321 Adjustment disorder with depressed mood: Secondary | ICD-10-CM

## 2019-11-26 DIAGNOSIS — R1310 Dysphagia, unspecified: Secondary | ICD-10-CM

## 2019-11-26 DIAGNOSIS — I495 Sick sinus syndrome: Secondary | ICD-10-CM

## 2019-11-26 DIAGNOSIS — G301 Alzheimer's disease with late onset: Secondary | ICD-10-CM

## 2019-11-26 DIAGNOSIS — F0280 Dementia in other diseases classified elsewhere without behavioral disturbance: Secondary | ICD-10-CM

## 2019-11-26 DIAGNOSIS — I5022 Chronic systolic (congestive) heart failure: Secondary | ICD-10-CM

## 2019-11-26 DIAGNOSIS — K5901 Slow transit constipation: Secondary | ICD-10-CM

## 2019-11-26 DIAGNOSIS — K449 Diaphragmatic hernia without obstruction or gangrene: Secondary | ICD-10-CM

## 2019-11-26 NOTE — Progress Notes (Signed)
Location:  Medical illustrator of Service:  SNF (31) Provider:   [name], ANP [facility] [phone]   [name], DO  Patient Care Team: [name], DO as PCP - General (Geriatric Medicine) [name], MD as PCP - Cardiology (Cardiology) [name], NP as Nurse Practitioner (Nurse Practitioner)  Extended Emergency Contact Information Primary Emergency Contact: [name] Address: [address]          [place], [place] [address] [name] Home Phone: [phone] Relation: Other Secondary Emergency Contact: [name] [place] Home Phone: [phone] Relation: Daughter  Code Status:  DNR Goals of care: Advanced Directive information Advanced Directives 10/24/2019  Does Patient Have a Medical Advance Directive? Yes  Type of Estate agent of [place];Living will;Out of facility DNR (pink MOST or yellow form)  Does patient want to make changes to medical advance directive? -  Copy of Healthcare Power of Attorney in Chart? Yes - validated most recent copy scanned in chart (See row information)  Pre-existing out of facility DNR order (yellow form or pink MOST form) Yellow form placed in chart (order not valid for inpatient use);Pink MOST form placed in chart (order not valid for inpatient use)     Chief Complaint  Patient presents with  . Medical Management of Chronic Issues    HPI:  Pt is a 86 y.o. female seen today for medical management of chronic diseases.    AD: Nursing reports some periods of difficulty sleeping followed by periods of lethargy. In the afternoon she can become tearful and agitated a few times per week. She has been progressively more confused over the past year. 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   There are no reports of urinary symptoms or issues  with constipation/diarrhea. Weights are stable, no issues with coughing, sob, or edema  Wt Readings from Last 3 Encounters:  11/26/19 152 lb 11.2 oz (69.3 kg)  10/23/19 149 lb 6.4 oz (67.8 kg)  08/27/19 149 lb (67.6 kg)   Has a pacemaker with remote transmission in epic   Prior hx of gerd and hiatal hernia, no current symptoms     Past Medical History:  Diagnosis Date  . CAD (coronary artery disease)   . Chronic low back pain   . Contusion of foot, right 05/15/12  . Dementia (HCC) 10/16/2016   06/25/17 MMSE 18/30 passed clock  . Diverticulitis   . Dyslipidemia   . Dysphagia 12/11/2016  . H/O: hysterectomy   . HTN (hypertension)   . Memory disturbance 05/2012   MMSE 26/30, intact Clock test  . Nonischemic cardiomyopathy (HCC)   . Osteoporosis   . Pulmonary embolism (HCC)   . Right knee pain 05/15/12  . Spinal stenosis   . Ulcer disease   . Weight loss    Past Surgical History:  Procedure Laterality Date  . APPENDECTOMY  2010  . BACK SURGERY    . BIV ICD GENERTAOR CHANGE OUT N/A 05/24/2011   Procedure: BIV ICD GENERTAOR CHANGE OUT;  Surgeon: [name], MD;  Location: [facility] CATH LAB;  Service: Cardiovascular;  Laterality: N/A;  . CATARACT EXTRACTION    . COLON RESECTION  2005  . CORONARY ANGIOPLASTY WITH STENT PLACEMENT  01/2003  . HEMICOLECTOMY    . HIP ARTHROPLASTY Left 04/05/2014   Procedure: ARTHROPLASTY BIPOLAR  HIP;  Surgeon: Shelda Pal, MD;  Location: [facility] ORS;  Service: Orthopedics;  Laterality: Left;  . left shoulder replacement  1991   Dr. [name]  . ORIF HIP FRACTURE Left 09/30/2016   Procedure: OPEN REDUCTION INTERNAL FIXATION PERIPROSTHETIC HIP FRACTURE;  Surgeon: Ollen Gross, MD;  Location: [facility];  Service: Orthopedics;  Laterality: Left;  . right shoulder repair  1985   Dr. [name]  . rotator cuff debridement  1999   Dr. [name]    Allergies  Allergen Reactions  . Arthrotec [Diclofenac-Misoprostol]   . Ibuprofen   . Lactose Intolerance (Gi)   .  Milk-Related Compounds Nausea And Vomiting    Cheese  . Other     Grass and ragweed   . Oxycontin [Oxycodone Hcl]   . Pollen Extract   . Verelan [Verapamil]     Outpatient Encounter Medications as of 11/26/2019  Medication Sig  . acetaminophen (TYLENOL) 325 MG tablet Take 650 mg by mouth 3 (three) times daily.   Marland Kitchen albuterol (PROVENTIL HFA;VENTOLIN HFA) 108 (90 Base) MCG/ACT inhaler Inhale 2 puffs into the lungs every 6 (six) hours as needed for wheezing or shortness of breath.  . allopurinol (ZYLOPRIM) 100 MG tablet Take 100 mg by mouth daily.  Marland Kitchen aspirin EC 81 MG tablet Take 81 mg by mouth daily.  . Carboxymethylcellul-Glycerin (OPTIVE) 0.5-0.9 % SOLN Apply 2 drops to eye 2 (two) times daily.  . carvedilol (COREG) 3.125 MG tablet Take 1.5625 mg by mouth 2 (two) times daily with a meal.   . cyanocobalamin 1000 MCG tablet Take 1,000 mcg by mouth daily.   . diclofenac Sodium (VOLTAREN) 1 % GEL Apply 4 g topically 4 (four) times daily. Left knee  . Docusate Sodium 100 MG capsule Take 100 mg by mouth daily. May crush  . furosemide (LASIX) 20 MG tablet Take 20 mg by mouth daily.  Marland Kitchen losartan (COZAAR) 25 MG tablet Take 0.5 tablets (12.5 mg total) by mouth at bedtime.  . melatonin 5 MG TABS Take 5 mg by mouth at bedtime.  . Nutritional Supplements (FEEDING SUPPLEMENT, BOOST BREEZE,) LIQD Take 1 Container by mouth in the morning and at bedtime.   Marland Kitchen omeprazole (PRILOSEC OTC) 20 MG tablet Take 20 mg by mouth 2 (two) times daily.  . polyethylene glycol (MIRALAX / GLYCOLAX) packet Take 17 g by mouth every other day. Hold for loose stools  . potassium chloride (KLOR-CON) 10 MEQ tablet Take 10 mEq by mouth daily.  . sertraline (ZOLOFT) 50 MG tablet Take 25 mg by mouth at bedtime.   . Zinc Oxide (DESITIN) 13 % CREA Apply topically as needed (after each bathroom use).   No facility-administered encounter medications on file as of 11/26/2019.    Review of Systems  Constitutional: Negative for activity  change, appetite change, chills, diaphoresis, fatigue, fever and unexpected weight change.  HENT: Negative for congestion.   Respiratory: Negative for cough, shortness of breath and wheezing.   Cardiovascular: Negative for chest pain, palpitations and leg swelling.  Gastrointestinal: Negative for abdominal distention, abdominal pain, constipation and diarrhea.  Genitourinary: Negative for difficulty urinating and dysuria.  Musculoskeletal: Positive for arthralgias and gait problem. Negative for back pain, joint swelling and myalgias.  Skin: Negative for rash.  Neurological: Positive for weakness. Negative for dizziness, tremors, seizures, syncope, facial asymmetry, speech difficulty, light-headedness, numbness and headaches.  Psychiatric/Behavioral: Positive for confusion. Negative for agitation, behavioral problems, dysphoric mood, hallucinations, self-injury and sleep disturbance. The patient is nervous/anxious. The patient is not  hyperactive.     Immunization History  Administered Date(s) Administered  . Influenza Inj Mdck Quad Pf 01/05/2016  . Influenza, High Dose Seasonal PF 01/01/2019  . Influenza,inj,Quad PF,6+ Mos 01/07/2018  . Influenza-Unspecified 12/22/2014, 01/10/2017  . Moderna SARS-COVID-2 Vaccination 03/24/2019, 04/21/2019  . Pneumococcal Conjugate-13 12/14/2013  . Pneumococcal Polysaccharide-23 03/29/2016  . Tdap 03/29/2016  . Zoster 11/03/2007  . Zoster Recombinat (Shingrix) 04/01/2017, 06/27/2017   Pertinent  Health Maintenance Due  Topic Date Due  . PNA vac Low Risk Adult  Completed  . DEXA SCAN  Discontinued   Fall Risk  07/02/2019 04/22/2018  Falls in the past year? 0 0  Number falls in past yr: 0 0  Injury with Fall? 0 0  Risk for fall due to : - Impaired mobility;Medication side effect;Mental status change;Impaired balance/gait  Follow up - Falls evaluation completed;Education provided  Comment - seen by therapy for all of this   Functional Status Survey:      Vitals:   11/26/19 1057  Weight: 152 lb 11.2 oz (69.3 kg)   Body mass index is 25.41 kg/m. Physical Exam Vitals and nursing note reviewed.  Constitutional:      General: She is not in acute distress.    Appearance: She is not diaphoretic.  HENT:     Head: Normocephalic and atraumatic.     Mouth/Throat:     Mouth: Mucous membranes are moist.     Pharynx: Oropharynx is clear. No oropharyngeal exudate.  Neck:     Vascular: No JVD.  Cardiovascular:     Rate and Rhythm: Normal rate and regular rhythm.     Heart sounds: No murmur heard.   Pulmonary:     Effort: Pulmonary effort is normal. No respiratory distress.     Breath sounds: Normal breath sounds. No wheezing.  Abdominal:     General: Bowel sounds are normal. There is no distension.  Musculoskeletal:        General: No swelling, tenderness, deformity or signs of injury.     Cervical back: No rigidity or tenderness.     Right lower leg: No edema.     Left lower leg: No edema.  Lymphadenopathy:     Cervical: No cervical adenopathy.  Skin:    General: Skin is warm and dry.     Findings: No erythema.  Neurological:     General: No focal deficit present.     Mental Status: She is alert. Mental status is at baseline.  Psychiatric:        Mood and Affect: Mood normal.     Labs reviewed: Recent Labs    12/08/18 0000 04/02/19 0000 07/06/19 0000  NA 140 144 140  140  K 4.7 4.6 4.1  4.1  CL  --  105 105  105  CO2  --  21 21  21   BUN 22* 26* 40*  40*  CREATININE 0.8 0.9 0.9  0.9  CALCIUM  --  9.4 8.5*  8.5*   No results for input(s): AST, ALT, ALKPHOS, BILITOT, PROT, ALBUMIN in the last 8760 hours. Recent Labs    04/02/19 0000 07/06/19 0000  WBC 4.4 10.0  10.0  NEUTROABS 3  --   HGB 14.0 10.1*  10.1*  HCT 30* 32*  32*  PLT 157 207  207   Lab Results  Component Value Date   TSH 6.46 (A) 06/08/2019   No results found for: HGBA1C Lab Results  Component Value Date   CHOL 159 02/09/2017  HDL 49 02/09/2017   LDLCALC 96 02/09/2017   TRIG 69 02/09/2017   CHOLHDL 3.2 02/09/2017    Significant Diagnostic Results in last 30 days:  CUP PACEART REMOTE DEVICE CHECK  Result Date: 11/20/2019 Scheduled remote reviewed. Normal device function.  14 AHR longest 8 mins 1 VHR 9 beats Next remote 91 days. JM   Assessment/Plan  1. Late onset Alzheimer's disease without behavioral disturbance (HCC) Progressively more confused and has periods of sleep pattern disturbance and anxiety/agitation. These symptoms are a part of her dementia. I have recommended the staff try to reorient her and provide distraction. If this worsens or does not improve the staff should notify [facility].   2. Dysphagia, unspecified type No current issues, continue regular diet with puree meats 3. Sick sinus syndrome [facility]) S/p pacemaker   4. Chronic systolic heart failure (HCC) Appears compensated on exam  Continue Lasix 20 mg qd   5. Slow transit constipation Controlled with miralax qod   6. Hiatal hernia Continue prilosec 20 mg bid   7. Situational depression Continue Zoloft 25 mg qd She did not tolerate the higher dosage (50 mg) and may benefit from changing to another agent if this worsens or does not improve. At one time her family suggested Cymbalta   Family/ staff Communication: discussed with her nurse  Labs/tests ordered:  NA

## 2020-01-08 ENCOUNTER — Non-Acute Institutional Stay (SKILLED_NURSING_FACILITY): Payer: Medicare Other | Admitting: Adult Health

## 2020-01-08 ENCOUNTER — Encounter: Payer: Self-pay | Admitting: Adult Health

## 2020-01-08 DIAGNOSIS — K449 Diaphragmatic hernia without obstruction or gangrene: Secondary | ICD-10-CM

## 2020-01-08 DIAGNOSIS — F4321 Adjustment disorder with depressed mood: Secondary | ICD-10-CM | POA: Diagnosis not present

## 2020-01-08 DIAGNOSIS — R634 Abnormal weight loss: Secondary | ICD-10-CM

## 2020-01-08 DIAGNOSIS — I5022 Chronic systolic (congestive) heart failure: Secondary | ICD-10-CM

## 2020-01-08 DIAGNOSIS — R1310 Dysphagia, unspecified: Secondary | ICD-10-CM

## 2020-01-08 DIAGNOSIS — F0280 Dementia in other diseases classified elsewhere without behavioral disturbance: Secondary | ICD-10-CM

## 2020-01-08 DIAGNOSIS — G301 Alzheimer's disease with late onset: Secondary | ICD-10-CM | POA: Diagnosis not present

## 2020-01-08 NOTE — Progress Notes (Signed)
Location:  Medical illustrator of Service:  SNF (31) Provider:   [name], ANP [facility] [phone]   [name], DO  Patient Care Team: [name], DO as PCP - General (Geriatric Medicine) [name], MD as PCP - Cardiology (Cardiology) [name], NP as Nurse Practitioner (Nurse Practitioner)  Extended Emergency Contact Information Primary Emergency Contact: [name] Address: [address], [address]          [place], [place] [address] [name] Home Phone: [phone] Relation: Other Secondary Emergency Contact: [name] [place] Home Phone: [phone] Relation: Daughter  Code Status:  DNR Goals of care: Advanced Directive information Advanced Directives 10/24/2019  Does Patient Have a Medical Advance Directive? Yes  Type of Estate agent of [place];Living will;Out of facility DNR (pink MOST or yellow form)  Does patient want to make changes to medical advance directive? -  Copy of Healthcare Power of Attorney in Chart? Yes - validated most recent copy scanned in chart (See row information)  Pre-existing out of facility DNR order (yellow form or pink MOST form) Yellow form placed in chart (order not valid for inpatient use);Pink MOST form placed in chart (order not valid for inpatient use)     Chief Complaint  Patient presents with  . Medical Management of Chronic Issues    HPI:  Pt is a 86 y.o. female seen today for medical management of chronic diseases.    She has been losing weight and eating less. Down 4 lbs since August. No reports of difficulty swallowing. TSH slightly elevated earlier this year, may recheck with annual labs Wt Readings from Last 3 Encounters:  01/08/20 145 lb 3.2 oz (65.9 kg)  11/26/19 152 lb 11.2 oz (69.3 kg)  10/23/19 149 lb 6.4 oz (67.8 kg)   The nurse denies any issues with depression such as crying or  anxiety except during her shower which is not uncommon for her  Hiatal hernia: has had occasional vomiting episodes which are rare. No other digestive issues reported.   No sob, pnd, cp, doe, or increased edema  Her dementia continues to progress over the past year. Requires assistance with all ADLs and is no longer ambulatory. No combativeness or resistance reported.    Past Medical History:  Diagnosis Date  . CAD (coronary artery disease)   . Chronic low back pain   . Contusion of foot, right 05/15/12  . Dementia (HCC) 10/16/2016   06/25/17 MMSE 18/30 passed clock  . Diverticulitis   . Dyslipidemia   . Dysphagia 12/11/2016  . H/O: hysterectomy   . HTN (hypertension)   . Memory disturbance 05/2012   MMSE 26/30, intact Clock test  . Nonischemic cardiomyopathy (HCC)   . Osteoporosis   . Pulmonary embolism (HCC)   . Right knee pain 05/15/12  . Spinal stenosis   . Ulcer disease   . Weight loss    Past Surgical History:  Procedure Laterality Date  . APPENDECTOMY  2010  . BACK SURGERY    . BIV ICD GENERTAOR CHANGE OUT N/A 05/24/2011   Procedure: BIV ICD GENERTAOR CHANGE OUT;  Surgeon: [name], MD;  Location: [facility] CATH LAB;  Service: Cardiovascular;  Laterality: N/A;  . CATARACT EXTRACTION    . COLON RESECTION  2005  . CORONARY ANGIOPLASTY WITH STENT PLACEMENT  01/2003  . HEMICOLECTOMY    . HIP ARTHROPLASTY Left 04/05/2014   Procedure: ARTHROPLASTY BIPOLAR HIP;  Surgeon: Shelda Pal, MD;  Location: [facility];  Service: Orthopedics;  Laterality: Left;  . left shoulder replacement  1991   Dr. [name]  . ORIF HIP FRACTURE Left 09/30/2016   Procedure: OPEN REDUCTION INTERNAL FIXATION PERIPROSTHETIC HIP FRACTURE;  Surgeon: Ollen Gross, MD;  Location: [facility];  Service: Orthopedics;  Laterality: Left;  . right shoulder repair  1985   Dr. [name]  . rotator cuff debridement  1999   Dr. [name]    Allergies  Allergen Reactions  . Arthrotec [Diclofenac-Misoprostol]   . Ibuprofen   .  Lactose Intolerance (Gi)   . Milk-Related Compounds Nausea And Vomiting    Cheese  . Other     Grass and ragweed   . Oxycontin [Oxycodone Hcl]   . Pollen Extract   . Verelan [Verapamil]     Outpatient Encounter Medications as of 01/08/2020  Medication Sig  . acetaminophen (TYLENOL) 325 MG tablet Take 650 mg by mouth 3 (three) times daily.   Marland Kitchen albuterol (PROVENTIL HFA;VENTOLIN HFA) 108 (90 Base) MCG/ACT inhaler Inhale 2 puffs into the lungs every 6 (six) hours as needed for wheezing or shortness of breath.  . allopurinol (ZYLOPRIM) 100 MG tablet Take 100 mg by mouth daily.  Marland Kitchen aspirin EC 81 MG tablet Take 81 mg by mouth daily.  . Carboxymethylcellul-Glycerin (OPTIVE) 0.5-0.9 % SOLN Apply 2 drops to eye 2 (two) times daily.  . carvedilol (COREG) 3.125 MG tablet Take 1.5625 mg by mouth 2 (two) times daily with a meal.   . cyanocobalamin 1000 MCG tablet Take 1,000 mcg by mouth daily.   . diclofenac Sodium (VOLTAREN) 1 % GEL Apply 4 g topically 4 (four) times daily. Left knee  . Docusate Sodium 100 MG capsule Take 100 mg by mouth daily. May crush  . furosemide (LASIX) 20 MG tablet Take 20 mg by mouth daily.  Marland Kitchen losartan (COZAAR) 25 MG tablet Take 0.5 tablets (12.5 mg total) by mouth at bedtime.  . melatonin 5 MG TABS Take 5 mg by mouth at bedtime.  . Nutritional Supplements (FEEDING SUPPLEMENT, BOOST BREEZE,) LIQD Take 1 Container by mouth in the morning and at bedtime.   Marland Kitchen omeprazole (PRILOSEC OTC) 20 MG tablet Take 20 mg by mouth 2 (two) times daily.  . polyethylene glycol (MIRALAX / GLYCOLAX) packet Take 17 g by mouth every other day. Hold for loose stools  . potassium chloride (KLOR-CON) 10 MEQ tablet Take 10 mEq by mouth daily.  . sertraline (ZOLOFT) 50 MG tablet Take 25 mg by mouth at bedtime.   . Zinc Oxide (DESITIN) 13 % CREA Apply topically as needed (after each bathroom use).   No facility-administered encounter medications on file as of 01/08/2020.    Review of Systems  Unable  to perform ROS: Dementia    Immunization History  Administered Date(s) Administered  . Influenza Inj Mdck Quad Pf 01/05/2016  . Influenza, High Dose Seasonal PF 01/01/2019  . Influenza,inj,Quad PF,6+ Mos 01/07/2018  . Influenza-Unspecified 12/22/2014, 01/10/2017  . Moderna SARS-COVID-2 Vaccination 03/24/2019, 04/21/2019  . Pneumococcal Conjugate-13 12/14/2013  . Pneumococcal Polysaccharide-23 03/29/2016  . Tdap 03/29/2016  . Zoster 11/03/2007  . Zoster Recombinat (Shingrix) 04/01/2017, 06/27/2017   Pertinent  Health Maintenance Due  Topic Date Due  . PNA vac Low Risk Adult  Completed  . DEXA SCAN  Discontinued   Fall Risk  07/02/2019 04/22/2018  Falls in the past year? 0 0  Number falls in past yr: 0 0  Injury with Fall? 0 0  Risk for fall due to : - Impaired mobility;Medication side effect;Mental status change;Impaired balance/gait  Follow up - Falls evaluation completed;Education provided  Comment - seen by therapy for all of this   Functional Status Survey:    Vitals:   01/08/20 1347  Weight: 145 lb 3.2 oz (65.9 kg)   Body mass index is 24.16 kg/m. Physical Exam Vitals and nursing note reviewed.  Constitutional:      Appearance: Normal appearance.     Comments: Frail thin appearing  Musculoskeletal:     Right lower leg: No edema.     Left lower leg: No edema.  Skin:    General: Skin is warm and dry.  Neurological:     General: No focal deficit present.     Mental Status: She is alert. Mental status is at baseline.  Psychiatric:        Mood and Affect: Mood normal.     Labs reviewed: Recent Labs    04/02/19 0000 07/06/19 0000  NA 144 140  140  K 4.6 4.1  4.1  CL 105 105  105  CO2 21 21  21   BUN 26* 40*  40*  CREATININE 0.9 0.9  0.9  CALCIUM 9.4 8.5*  8.5*   No results for input(s): AST, ALT, ALKPHOS, BILITOT, PROT, ALBUMIN in the last 8760 hours. Recent Labs    04/02/19 0000 07/06/19 0000  WBC 4.4 10.0  10.0  NEUTROABS 3  --   HGB  14.0 10.1*  10.1*  HCT 30* 32*  32*  PLT 157 207  207   Lab Results  Component Value Date   TSH 6.46 (A) 06/08/2019   No results found for: HGBA1C Lab Results  Component Value Date   CHOL 159 02/09/2017   HDL 49 02/09/2017   LDLCALC 96 02/09/2017   TRIG 69 02/09/2017   CHOLHDL 3.2 02/09/2017    Significant Diagnostic Results in last 30 days:  No results found.  Assessment/Plan 1. Late onset Alzheimer's dementia without behavioral disturbance (HCC) Severe stage DNR in place Continue comfort care in the skilled care setting.   2. Weight loss Due to decreased intake associated with progressive dementia Continue boost bid   3. Chronic systolic heart failure (HCC) Compensated Continue lasix 20 mg qd and losartan 12.5 mg qd   4. Situational depression Mood is stable on Zoloft 25 mg, would not taper at this time   5. Hiatal hernia Continue Prilosec due to prior hx of this and gerd  6. Dysphagia, unspecified type Continue regular diet with puree meats Assistance with all meals Asp prec    Family/ staff Communication: nurse  Labs/tests ordered:  NA

## 2020-02-18 ENCOUNTER — Ambulatory Visit (INDEPENDENT_AMBULATORY_CARE_PROVIDER_SITE_OTHER): Payer: Medicare Other

## 2020-02-18 ENCOUNTER — Non-Acute Institutional Stay (SKILLED_NURSING_FACILITY): Payer: Medicare Other | Admitting: Adult Health

## 2020-02-18 DIAGNOSIS — I428 Other cardiomyopathies: Secondary | ICD-10-CM | POA: Diagnosis not present

## 2020-02-18 DIAGNOSIS — M1A09X0 Idiopathic chronic gout, multiple sites, without tophus (tophi): Secondary | ICD-10-CM | POA: Diagnosis not present

## 2020-02-18 DIAGNOSIS — G301 Alzheimer's disease with late onset: Secondary | ICD-10-CM | POA: Diagnosis not present

## 2020-02-18 DIAGNOSIS — N1831 Chronic kidney disease, stage 3a: Secondary | ICD-10-CM

## 2020-02-18 DIAGNOSIS — F4321 Adjustment disorder with depressed mood: Secondary | ICD-10-CM

## 2020-02-18 DIAGNOSIS — I495 Sick sinus syndrome: Secondary | ICD-10-CM

## 2020-02-18 DIAGNOSIS — R1310 Dysphagia, unspecified: Secondary | ICD-10-CM

## 2020-02-18 DIAGNOSIS — F0280 Dementia in other diseases classified elsewhere without behavioral disturbance: Secondary | ICD-10-CM

## 2020-02-18 NOTE — Progress Notes (Signed)
Location:  Medical illustrator of Service:  SNF (31) Provider:   [name], ANP [facility] [phone]   [name], DO  Patient Care Team: [name], DO as PCP - General (Geriatric Medicine) [name], MD as PCP - Cardiology (Cardiology) [name], NP as Nurse Practitioner (Nurse Practitioner)  Extended Emergency Contact Information Primary Emergency Contact: [name] Address: [address], [address]          [place], [place] [address] [name] Home Phone: [phone] Relation: Other Secondary Emergency Contact: [name] [place] Home Phone: [phone] Relation: Daughter  Code Status:  DNR Goals of care: Advanced Directive information Advanced Directives 10/24/2019  Does Patient Have a Medical Advance Directive? Yes  Type of Estate agent of [place];Living will;Out of facility DNR (pink MOST or yellow form)  Does patient want to make changes to medical advance directive? -  Copy of Healthcare Power of Attorney in Chart? Yes - validated most recent copy scanned in chart (See row information)  Pre-existing out of facility DNR order (yellow form or pink MOST form) Yellow form placed in chart (order not valid for inpatient use);Pink MOST form placed in chart (order not valid for inpatient use)     Chief Complaint  Patient presents with   Medical Management of Chronic Issues    HPI:  Pt is a 86 y.o. female seen today for medical management of chronic diseases.   Ms. B is resting in bed for my visit. She has no acute complaints. She remains pleasant and verbal but has advancing dementia and is confused most of the time.She is able to f/c and help feed herself but needs assistance with all other ADLs.   Has a pacemaker due to SSS with last remote transmission 11/19/19  No new bouts of gout, currently on allopurinol  Nurse reports her mood is  pleasant and only tearful during rarely with showers.   No reports of issues sleeping   No reports of trouble swallowing   Wt Readings from Last 3 Encounters:  02/19/20 150 lb 4.8 oz (68.2 kg)  01/08/20 145 lb 3.2 oz (65.9 kg)  11/26/19 152 lb 11.2 oz (69.3 kg)      Past Medical History:  Diagnosis Date   CAD (coronary artery disease)    Chronic low back pain    Contusion of foot, right 05/15/12   Dementia (HCC) 10/16/2016   06/25/17 MMSE 18/30 passed clock   Diverticulitis    Dyslipidemia    Dysphagia 12/11/2016   H/O: hysterectomy    HTN (hypertension)    Memory disturbance 05/2012   MMSE 26/30, intact Clock test   Nonischemic cardiomyopathy (HCC)    Osteoporosis    Pulmonary embolism (HCC)    Right knee pain 05/15/12   Spinal stenosis    Ulcer disease    Weight loss    Past Surgical History:  Procedure Laterality Date   APPENDECTOMY  2010   BACK SURGERY     BIV ICD GENERTAOR CHANGE OUT N/A 05/24/2011   Procedure: BIV ICD GENERTAOR CHANGE OUT;  Surgeon: [name], MD;  Location: [facility] CATH LAB;  Service: Cardiovascular;  Laterality: N/A;   CATARACT EXTRACTION     COLON RESECTION  2005   CORONARY ANGIOPLASTY WITH STENT PLACEMENT  01/2003   HEMICOLECTOMY     HIP ARTHROPLASTY Left 04/05/2014   Procedure: ARTHROPLASTY BIPOLAR HIP;  Surgeon: Shelda Pal, MD;  Location: [facility] ORS;  Service: Orthopedics;  Laterality: Left;   left shoulder replacement  1991   Dr. [name]   ORIF HIP FRACTURE Left 09/30/2016   Procedure: OPEN REDUCTION INTERNAL FIXATION PERIPROSTHETIC HIP FRACTURE;  Surgeon: Ollen Gross, MD;  Location: [facility] ORS;  Service: Orthopedics;  Laterality: Left;   right shoulder repair  1985   Dr. [name]   rotator cuff debridement  1999   Dr. [name]    Allergies  Allergen Reactions   Arthrotec [Diclofenac-Misoprostol]    Ibuprofen    Lactose Intolerance (Gi)    Milk-Related Compounds Nausea And Vomiting    Cheese   Other     Grass  and ragweed    Oxycontin [Oxycodone Hcl]    Pollen Extract    Verelan [Verapamil]     Outpatient Encounter Medications as of 02/18/2020  Medication Sig   acetaminophen (TYLENOL) 325 MG tablet Take 650 mg by mouth 3 (three) times daily.    albuterol (PROVENTIL HFA;VENTOLIN HFA) 108 (90 Base) MCG/ACT inhaler Inhale 2 puffs into the lungs every 6 (six) hours as needed for wheezing or shortness of breath.   allopurinol (ZYLOPRIM) 100 MG tablet Take 100 mg by mouth daily.   aspirin EC 81 MG tablet Take 81 mg by mouth daily.   Carboxymethylcellul-Glycerin (OPTIVE) 0.5-0.9 % SOLN Apply 2 drops to eye 2 (two) times daily.   carvedilol (COREG) 3.125 MG tablet Take 1.5625 mg by mouth 2 (two) times daily with a meal.    cyanocobalamin 1000 MCG tablet Take 1,000 mcg by mouth daily.    diclofenac Sodium (VOLTAREN) 1 % GEL Apply 4 g topically 4 (four) times daily. Left knee   Docusate Sodium 100 MG capsule Take 100 mg by mouth daily. May crush   furosemide (LASIX) 20 MG tablet Take 20 mg by mouth daily.   losartan (COZAAR) 25 MG tablet Take 0.5 tablets (12.5 mg total) by mouth at bedtime.   melatonin 5 MG TABS Take 5 mg by mouth at bedtime.   Nutritional Supplements (FEEDING SUPPLEMENT, BOOST BREEZE,) LIQD Take 1 Container by mouth in the morning and at bedtime.    omeprazole (PRILOSEC OTC) 20 MG tablet Take 20 mg by mouth 2 (two) times daily.   polyethylene glycol (MIRALAX / GLYCOLAX) packet Take 17 g by mouth every other day. Hold for loose stools   potassium chloride (KLOR-CON) 10 MEQ tablet Take 10 mEq by mouth daily.   sertraline (ZOLOFT) 50 MG tablet Take 25 mg by mouth at bedtime.    Zinc Oxide (DESITIN) 13 % CREA Apply topically as needed (after each bathroom use).   No facility-administered encounter medications on file as of 02/18/2020.    Review of Systems  Constitutional: Negative for activity change, appetite change, chills, diaphoresis, fatigue, fever and unexpected  weight change.  HENT: Negative for congestion.   Respiratory: Negative for cough, shortness of breath and wheezing.   Cardiovascular: Negative for chest pain, palpitations and leg swelling.  Gastrointestinal: Negative for abdominal distention, abdominal pain, constipation and diarrhea.  Genitourinary: Negative for difficulty urinating and dysuria.  Musculoskeletal: Negative for arthralgias, back pain, gait problem, joint swelling and myalgias.  Neurological: Negative for dizziness, tremors, seizures, syncope, facial asymmetry, speech difficulty, weakness, light-headedness, numbness and headaches.  Psychiatric/Behavioral: Negative for agitation, behavioral problems and confusion.    Immunization History  Administered Date(s) Administered   Influenza Inj Mdck Quad Pf 01/05/2016   Influenza, High Dose Seasonal PF 01/01/2019   Influenza,inj,Quad PF,6+ Mos 01/07/2018   Influenza-Unspecified  12/22/2014, 01/10/2017   Moderna SARS-COVID-2 Vaccination 03/24/2019, 04/21/2019   Pneumococcal Conjugate-13 12/14/2013   Pneumococcal Polysaccharide-23 03/29/2016   Tdap 03/29/2016   Zoster 11/03/2007   Zoster Recombinat (Shingrix) 04/01/2017, 06/27/2017   Pertinent  Health Maintenance Due  Topic Date Due   PNA vac Low Risk Adult  Completed   DEXA SCAN  Discontinued   Fall Risk  07/02/2019 04/22/2018  Falls in the past year? 0 0  Number falls in past yr: 0 0  Injury with Fall? 0 0  Risk for fall due to : - Impaired mobility;Medication side effect;Mental status change;Impaired balance/gait  Follow up - Falls evaluation completed;Education provided  Comment - seen by therapy for all of this   Functional Status Survey:    There were no vitals filed for this visit. There is no height or weight on file to calculate BMI. Physical Exam Vitals and nursing note reviewed.  Constitutional:      General: She is not in acute distress.    Appearance: She is not diaphoretic.  HENT:     Head:  Normocephalic and atraumatic.     Mouth/Throat:     Mouth: Mucous membranes are moist.     Pharynx: Oropharynx is clear.  Neck:     Vascular: No JVD.  Cardiovascular:     Rate and Rhythm: Normal rate and regular rhythm.     Heart sounds: No murmur heard.   Pulmonary:     Effort: Pulmonary effort is normal. No respiratory distress.     Breath sounds: Normal breath sounds. No wheezing.  Abdominal:     General: Bowel sounds are normal. There is no distension.     Palpations: Abdomen is soft.  Musculoskeletal:        General: No swelling, tenderness, deformity or signs of injury.     Right lower leg: No edema.     Left lower leg: No edema.  Skin:    General: Skin is warm and dry.  Neurological:     General: No focal deficit present.     Mental Status: She is alert. Mental status is at baseline.  Psychiatric:        Mood and Affect: Mood normal.     Labs reviewed: Recent Labs    04/02/19 0000 07/06/19 0000  NA 144 140   140  K 4.6 4.1   4.1  CL 105 105   105  CO2 21 21   21   BUN 26* 40*   40*  CREATININE 0.9 0.9   0.9  CALCIUM 9.4 8.5*   8.5*   No results for input(s): AST, ALT, ALKPHOS, BILITOT, PROT, ALBUMIN in the last 8760 hours. Recent Labs    04/02/19 0000 07/06/19 0000  WBC 4.4 10.0   10.0  NEUTROABS 3  --   HGB 14.0 10.1*   10.1*  HCT 30* 32*   32*  PLT 157 207   207   Lab Results  Component Value Date   TSH 6.46 (A) 06/08/2019   No results found for: HGBA1C Lab Results  Component Value Date   CHOL 159 02/09/2017   HDL 49 02/09/2017   LDLCALC 96 02/09/2017   TRIG 69 02/09/2017   CHOLHDL 3.2 02/09/2017    Significant Diagnostic Results in last 30 days:  No results found.  Assessment/Plan 1. Idiopathic chronic gout of multiple sites without tophus No new issues Continue allopurinol 100 mg qd   2. Dysphagia, unspecified type Tolerating a regular diet with pureed meats  3. Sick sinus syndrome [facility]) S/p pacemaker, remote transmission  documented in epic  4. Late onset Alzheimer's dementia without behavioral disturbance (HCC) Moderate to severe Continues to benefit from skilled care support  5. Situational depression Recommend continuing zoloft 25 mg qhs, tapering at this time may lead to destabilization in mood  6. Stage 3a chronic kidney disease (HCC) Continue to periodically monitor BMP and avoid nephrotoxic agents     Labs/tests ordered:  NA

## 2020-02-19 ENCOUNTER — Encounter: Payer: Self-pay | Admitting: Adult Health

## 2020-02-22 LAB — CUP PACEART REMOTE DEVICE CHECK
Battery Impedance: 2258 Ohm
Battery Remaining Longevity: 25 mo
Battery Voltage: 2.74 V
Brady Statistic AP VP Percent: 64 %
Brady Statistic AP VS Percent: 0 %
Brady Statistic AS VP Percent: 35 %
Brady Statistic AS VS Percent: 0 %
Date Time Interrogation Session: 20211203182440
Implantable Lead Implant Date: 20071005
Implantable Lead Implant Date: 20071005
Implantable Lead Location: 753858
Implantable Lead Location: 753859
Implantable Lead Model: 4194
Implantable Lead Model: 5076
Implantable Pulse Generator Implant Date: 20130307
Lead Channel Impedance Value: 414 Ohm
Lead Channel Impedance Value: 905 Ohm
Lead Channel Pacing Threshold Amplitude: 0.625 V
Lead Channel Pacing Threshold Amplitude: 0.75 V
Lead Channel Pacing Threshold Pulse Width: 0.4 ms
Lead Channel Pacing Threshold Pulse Width: 0.4 ms
Lead Channel Setting Pacing Amplitude: 2 V
Lead Channel Setting Pacing Amplitude: 2.5 V
Lead Channel Setting Pacing Pulse Width: 0.4 ms
Lead Channel Setting Sensing Sensitivity: 4 mV

## 2020-02-25 NOTE — Progress Notes (Signed)
Remote pacemaker transmission.   

## 2020-03-25 ENCOUNTER — Non-Acute Institutional Stay (SKILLED_NURSING_FACILITY): Payer: Medicare Other | Admitting: Adult Health

## 2020-03-25 ENCOUNTER — Encounter: Payer: Self-pay | Admitting: Adult Health

## 2020-03-25 DIAGNOSIS — R1310 Dysphagia, unspecified: Secondary | ICD-10-CM | POA: Diagnosis not present

## 2020-03-25 DIAGNOSIS — I495 Sick sinus syndrome: Secondary | ICD-10-CM | POA: Diagnosis not present

## 2020-03-25 DIAGNOSIS — F0280 Dementia in other diseases classified elsewhere without behavioral disturbance: Secondary | ICD-10-CM

## 2020-03-25 DIAGNOSIS — G301 Alzheimer's disease with late onset: Secondary | ICD-10-CM

## 2020-03-25 DIAGNOSIS — I5022 Chronic systolic (congestive) heart failure: Secondary | ICD-10-CM

## 2020-03-25 DIAGNOSIS — K5901 Slow transit constipation: Secondary | ICD-10-CM

## 2020-03-25 DIAGNOSIS — N1831 Chronic kidney disease, stage 3a: Secondary | ICD-10-CM

## 2020-03-25 NOTE — Progress Notes (Signed)
Location:  Medical illustrator of Service:  SNF (31) Provider:  [name], ANP [facility] [phone]  [name], DO  Patient Care Team: [name], DO as PCP - General (Geriatric Medicine) [name], MD as PCP - Cardiology (Cardiology) [name], NP as Nurse Practitioner (Nurse Practitioner)  Extended Emergency Contact Information Primary Emergency Contact: [name] Address: [address], [address]          [place], [place] [address] [name] Home Phone: [phone] Relation: Other Secondary Emergency Contact: [name] [place] Home Phone: [phone] Relation: Daughter  Code Status:  DNR Goals of care: Advanced Directive information Advanced Directives 10/24/2019  Does Patient Have a Medical Advance Directive? Yes  Type of Estate agent of [place];Living will;Out of facility DNR (pink MOST or yellow form)  Does patient want to make changes to medical advance directive? -  Copy of Healthcare Power of Attorney in Chart? Yes - validated most recent copy scanned in chart (See row information)  Pre-existing out of facility DNR order (yellow form or pink MOST form) Yellow form placed in chart (order not valid for inpatient use);Pink MOST form placed in chart (order not valid for inpatient use)     Chief Complaint  Patient presents with  . Medical Management of Chronic Issues    HPI:  Pt is a 87 y.o. female seen today for medical management of chronic diseases.   Resident resting in her chair for the visit in her room with no acute complaints.  Review of matrix records shows low bp at times 89-90 systolic. No reports of syncope, dizziness, or lethargy.  Continues on a regular diet with pureed meats with no reports of issues swallowing or with choking.  Dementia: moderate to severe with assistance needed in ADLs and incontinence. Hoyer lift for  transfers. Remains verbal but can not accurately answer q's.  Cries during baths and personal care at times. No report of issues sleeping.   Past Medical History:  Diagnosis Date  . CAD (coronary artery disease)   . Chronic low back pain   . Contusion of foot, right 05/15/12  . Dementia (HCC) 10/16/2016   06/25/17 MMSE 18/30 passed clock  . Diverticulitis   . Dyslipidemia   . Dysphagia 12/11/2016  . H/O: hysterectomy   . HTN (hypertension)   . Memory disturbance 05/2012   MMSE 26/30, intact Clock test  . Nonischemic cardiomyopathy (HCC)   . Osteoporosis   . Pulmonary embolism (HCC)   . Right knee pain 05/15/12  . Spinal stenosis   . Ulcer disease   . Weight loss    Past Surgical History:  Procedure Laterality Date  . APPENDECTOMY  2010  . BACK SURGERY    . BIV ICD GENERTAOR CHANGE OUT N/A 05/24/2011   Procedure: BIV ICD GENERTAOR CHANGE OUT;  Surgeon: [name], MD;  Location: [facility] CATH LAB;  Service: Cardiovascular;  Laterality: N/A;  . CATARACT EXTRACTION    . COLON RESECTION  2005  . CORONARY ANGIOPLASTY WITH STENT PLACEMENT  01/2003  . HEMICOLECTOMY    . HIP ARTHROPLASTY Left 04/05/2014   Procedure: ARTHROPLASTY BIPOLAR HIP;  Surgeon: Shelda Pal, MD;  Location: [facility] ORS;  Service: Orthopedics;  Laterality: Left;  . left shoulder replacement  1991   Dr. [name]  . ORIF HIP FRACTURE Left 09/30/2016   Procedure: OPEN REDUCTION INTERNAL FIXATION PERIPROSTHETIC HIP FRACTURE;  Surgeon: Ollen Gross,  MD;  Location: [facility] ORS;  Service: Orthopedics;  Laterality: Left;  . right shoulder repair  1985   Dr. [name]  . rotator cuff debridement  1999   Dr. [name]    Allergies  Allergen Reactions  . Arthrotec [Diclofenac-Misoprostol]   . Ibuprofen   . Lactose Intolerance (Gi)   . Milk-Related Compounds Nausea And Vomiting    Cheese  . Other     Grass and ragweed   . Oxycontin [Oxycodone Hcl]   . Pollen Extract   . Verelan [Verapamil]     Outpatient Encounter Medications as of  03/25/2020  Medication Sig  . acetaminophen (TYLENOL) 325 MG tablet Take 650 mg by mouth 3 (three) times daily.   Marland Kitchen albuterol (PROVENTIL HFA;VENTOLIN HFA) 108 (90 Base) MCG/ACT inhaler Inhale 2 puffs into the lungs every 6 (six) hours as needed for wheezing or shortness of breath.  . allopurinol (ZYLOPRIM) 100 MG tablet Take 100 mg by mouth daily.  Marland Kitchen aspirin EC 81 MG tablet Take 81 mg by mouth daily.  . Carboxymethylcellul-Glycerin (OPTIVE) 0.5-0.9 % SOLN Apply 2 drops to eye 2 (two) times daily.  . carvedilol (COREG) 3.125 MG tablet Take 1.5625 mg by mouth 2 (two) times daily with a meal.   . cyanocobalamin 1000 MCG tablet Take 1,000 mcg by mouth daily.   . diclofenac Sodium (VOLTAREN) 1 % GEL Apply 4 g topically 4 (four) times daily. Left knee  . Docusate Sodium 100 MG capsule Take 100 mg by mouth daily. May crush  . furosemide (LASIX) 20 MG tablet Take 20 mg by mouth daily.  . melatonin 5 MG TABS Take 5 mg by mouth at bedtime.  . Nutritional Supplements (FEEDING SUPPLEMENT, BOOST BREEZE,) LIQD Take 1 Container by mouth in the morning and at bedtime.   Marland Kitchen omeprazole (PRILOSEC OTC) 20 MG tablet Take 20 mg by mouth 2 (two) times daily.  . polyethylene glycol (MIRALAX / GLYCOLAX) packet Take 17 g by mouth every other day. Hold for loose stools  . potassium chloride (KLOR-CON) 10 MEQ tablet Take 10 mEq by mouth daily.  . sertraline (ZOLOFT) 50 MG tablet Take 25 mg by mouth at bedtime.   . Zinc Oxide (DESITIN) 13 % CREA Apply topically as needed (after each bathroom use).  . [DISCONTINUED] losartan (COZAAR) 25 MG tablet Take 0.5 tablets (12.5 mg total) by mouth at bedtime.   No facility-administered encounter medications on file as of 03/25/2020.    Review of Systems  Constitutional: Negative for activity change, appetite change, chills, diaphoresis, fatigue, fever and unexpected weight change.  HENT: Negative for congestion.   Respiratory: Negative for cough, shortness of breath and wheezing.    Cardiovascular: Negative for chest pain, palpitations and leg swelling.  Gastrointestinal: Negative for abdominal distention, abdominal pain, constipation and diarrhea.  Genitourinary: Negative for difficulty urinating and dysuria.  Musculoskeletal: Positive for gait problem. Negative for arthralgias, back pain, joint swelling and myalgias.  Skin: Positive for color change.  Neurological: Negative for dizziness, tremors, seizures, syncope, facial asymmetry, speech difficulty, weakness, light-headedness, numbness and headaches.  Psychiatric/Behavioral: Positive for confusion. Negative for agitation and behavioral problems. The patient is nervous/anxious.     Immunization History  Administered Date(s) Administered  . Influenza Inj Mdck Quad Pf 01/05/2016  . Influenza, High Dose Seasonal PF 01/01/2019  . Influenza,inj,Quad PF,6+ Mos 01/07/2018  . Influenza-Unspecified 12/22/2014, 01/10/2017, 01/08/2020  . Moderna Sars-Covid-2 Vaccination 03/24/2019, 04/21/2019  . Pneumococcal Conjugate-13 12/14/2013  . Pneumococcal Polysaccharide-23 03/29/2016  . Tdap 03/29/2016  .  Zoster 11/03/2007  . Zoster Recombinat (Shingrix) 04/01/2017, 06/27/2017   Pertinent  Health Maintenance Due  Topic Date Due  . PNA vac Low Risk Adult  Completed  . DEXA SCAN  Discontinued   Fall Risk  07/02/2019 04/22/2018  Falls in the past year? 0 0  Number falls in past yr: 0 0  Injury with Fall? 0 0  Risk for fall due to : - Impaired mobility;Medication side effect;Mental status change;Impaired balance/gait  Follow up - Falls evaluation completed;Education provided  Comment - seen by therapy for all of this   Functional Status Survey:    Vitals:   03/25/20 1547  BP: (!) 90/50  Pulse: 60   There is no height or weight on file to calculate BMI. Physical Exam Vitals and nursing note reviewed.  Constitutional:      General: She is not in acute distress.    Appearance: She is not diaphoretic.  HENT:     Head:  Normocephalic and atraumatic.     Mouth/Throat:     Mouth: Mucous membranes are moist.     Pharynx: Oropharynx is clear. No oropharyngeal exudate.  Eyes:     Conjunctiva/sclera: Conjunctivae normal.     Pupils: Pupils are equal, round, and reactive to light.  Neck:     Vascular: No carotid bruit or JVD.  Cardiovascular:     Rate and Rhythm: Normal rate and regular rhythm.     Heart sounds: No murmur heard.     Comments: Occasional skipped beats  Pulmonary:     Effort: Pulmonary effort is normal. No respiratory distress.     Breath sounds: Normal breath sounds. No wheezing.  Abdominal:     General: Bowel sounds are normal. There is no distension.     Palpations: Abdomen is soft.     Tenderness: There is no abdominal tenderness.  Musculoskeletal:     Cervical back: No rigidity or tenderness.     Right lower leg: No edema.     Left lower leg: No edema.  Lymphadenopathy:     Cervical: No cervical adenopathy.  Skin:    General: Skin is warm and dry.     Findings: Bruising (Left elbow and upper arm area ) present.     Comments: Right hand with polymem CDI  Neurological:     General: No focal deficit present.     Mental Status: She is alert. Mental status is at baseline.  Psychiatric:        Mood and Affect: Mood normal.     Labs reviewed: Recent Labs    04/02/19 0000 07/06/19 0000  NA 144 140  140  K 4.6 4.1  4.1  CL 105 105  105  CO2 21 21  21   BUN 26* 40*  40*  CREATININE 0.9 0.9  0.9  CALCIUM 9.4 8.5*  8.5*   No results for input(s): AST, ALT, ALKPHOS, BILITOT, PROT, ALBUMIN in the last 8760 hours. Recent Labs    04/02/19 0000 07/06/19 0000  WBC 4.4 10.0  10.0  NEUTROABS 3  --   HGB 14.0 10.1*  10.1*  HCT 30* 32*  32*  PLT 157 207  207   Lab Results  Component Value Date   TSH 6.46 (A) 06/08/2019   No results found for: HGBA1C Lab Results  Component Value Date   CHOL 159 02/09/2017   HDL 49 02/09/2017   LDLCALC 96 02/09/2017   TRIG 69  02/09/2017   CHOLHDL 3.2 02/09/2017  Significant Diagnostic Results in last 30 days:  No results found.  Assessment/Plan  1. Chronic systolic heart failure (HCC) Due to low bp discontinue losartan and continue lasix 20 mg qd If bp continues to run low will decrease lasix to qod   2. Sick sinus syndrome [facility]) S/p pacemaker   3. Dysphagia, unspecified type Asp prec Continue regular diet with puree meats Upright after meals x 30 min   4. Late onset Alzheimer's dementia without behavioral disturbance ([place]) Progressive decline in cognition and physical function c/w the disease. Continue supportive care in the skilled environment.  5. Stage 3a chronic kidney disease ([place]) Continue to periodically monitor BMP and avoid nephrotoxic agents  6. Slow transit constipation Controlled Continue miralax qod    Labs/tests ordered: NA

## 2020-05-05 ENCOUNTER — Encounter: Payer: Self-pay | Admitting: Adult Health

## 2020-05-05 NOTE — Progress Notes (Signed)
Location:  [facility] Room Number: 111-A Place of Service:  SNF [phone]) Provider:  [name], NP    Patient Care Team: [name], DO as PCP - General (Geriatric Medicine) [name], MD as PCP - Cardiology (Cardiology) [name], NP as Nurse Practitioner (Nurse Practitioner)  Extended Emergency Contact Information Primary Emergency Contact: [name] Address: [address], [address]          [place], [place] [address] [name] Home Phone: [phone] Relation: Other Secondary Emergency Contact: [name] [place] Home Phone: [phone] Relation: Daughter  Code Status:  DNR  Goals of care: Advanced Directive information Advanced Directives 05/05/2020  Does Patient Have a Medical Advance Directive? Yes  Type of Advance Directive Out of facility DNR (pink MOST or yellow form)  Does patient want to make changes to medical advance directive? No - Patient declined  Copy of Healthcare Power of Attorney in Chart? -  Pre-existing out of facility DNR order (yellow form or pink MOST form) Yellow form placed in chart (order not valid for inpatient use);Pink MOST form placed in chart (order not valid for inpatient use)     Chief Complaint  Patient presents with  . Medical Management of Chronic Issues    Routine Visit     HPI:  Pt is a 87 y.o. female seen today for medical management of chronic diseases.     Past Medical History:  Diagnosis Date  . CAD (coronary artery disease)   . Chronic low back pain   . Contusion of foot, right 05/15/12  . Dementia (HCC) 10/16/2016   06/25/17 MMSE 18/30 passed clock  . Diverticulitis   . Dyslipidemia   . Dysphagia 12/11/2016  . H/O: hysterectomy   . HTN (hypertension)   . Memory disturbance 05/2012   MMSE 26/30, intact Clock test  . Nonischemic cardiomyopathy (HCC)   . Osteoporosis   . Pulmonary embolism (HCC)   . Right knee pain 05/15/12  .  Spinal stenosis   . Ulcer disease   . Weight loss    Past Surgical History:  Procedure Laterality Date  . APPENDECTOMY  2010  . BACK SURGERY    . BIV ICD GENERTAOR CHANGE OUT N/A 05/24/2011   Procedure: BIV ICD GENERTAOR CHANGE OUT;  Surgeon: [name], MD;  Location: [facility] CATH LAB;  Service: Cardiovascular;  Laterality: N/A;  . CATARACT EXTRACTION    . COLON RESECTION  2005  . CORONARY ANGIOPLASTY WITH STENT PLACEMENT  01/2003  . HEMICOLECTOMY    . HIP ARTHROPLASTY Left 04/05/2014   Procedure: ARTHROPLASTY BIPOLAR HIP;  Surgeon: Shelda Pal, MD;  Location: [facility] ORS;  Service: Orthopedics;  Laterality: Left;  . left shoulder replacement  1991   Dr. [name]  . ORIF HIP FRACTURE Left 09/30/2016   Procedure: OPEN REDUCTION INTERNAL FIXATION PERIPROSTHETIC HIP FRACTURE;  Surgeon: Ollen Gross, MD;  Location: [facility]S;  Service: Orthopedics;  Laterality: Left;  . right shoulder repair  1985   Dr. [name]  . rotator cuff debridement  1999   Dr. [name]    Allergies  Allergen Reactions  . Arthrotec [Diclofenac-Misoprostol]   . Ibuprofen   . Lactose Intolerance (Gi)   . Milk-Related Compounds Nausea And Vomiting    Cheese  . Other     Grass and ragweed   . Oxycontin [Oxycodone Hcl]   . Pollen Extract   . Verelan [Verapamil]     Outpatient Encounter Medications as  of 05/05/2020  Medication Sig  . acetaminophen (TYLENOL) 325 MG tablet Take 650 mg by mouth 3 (three) times daily.  Marland Kitchen albuterol (PROVENTIL HFA;VENTOLIN HFA) 108 (90 Base) MCG/ACT inhaler Inhale 2 puffs into the lungs every 6 (six) hours as needed for wheezing or shortness of breath.  . allopurinol (ZYLOPRIM) 100 MG tablet Take 100 mg by mouth daily.  Marland Kitchen aspirin EC 81 MG tablet Take 81 mg by mouth daily.  . carboxymethylcellul-glycerin (REFRESH OPTIVE) 0.5-0.9 % ophthalmic solution Apply 2 drops to eye 2 (two) times daily.  . carvedilol (COREG) 3.125 MG tablet Take 1.5625 mg by mouth 2 (two) times daily with a meal.   .  cyanocobalamin 1000 MCG tablet Take 1,000 mcg by mouth daily.   . diclofenac Sodium (VOLTAREN) 1 % GEL Apply 4 g topically 4 (four) times daily. Left knee  . Docusate Sodium 100 MG capsule Take 100 mg by mouth daily. May crush  . furosemide (LASIX) 20 MG tablet Take 20 mg by mouth daily.  . melatonin 5 MG TABS Take 5 mg by mouth at bedtime.  . Nutritional Supplements (FEEDING SUPPLEMENT, BOOST BREEZE,) LIQD Take 1 Container by mouth in the morning and at bedtime.   Marland Kitchen omeprazole (PRILOSEC OTC) 20 MG tablet Take 20 mg by mouth 2 (two) times daily.  . polyethylene glycol (MIRALAX / GLYCOLAX) packet Take 17 g by mouth every other day. Hold for loose stools  . potassium chloride (KLOR-CON) 10 MEQ tablet Take 10 mEq by mouth daily.  . sertraline (ZOLOFT) 25 MG tablet Take 25 mg by mouth at bedtime.  . Zinc Oxide 13 % CREA Apply topically as needed (after each bathroom use).  . [DISCONTINUED] sertraline (ZOLOFT) 50 MG tablet Take 25 mg by mouth at bedtime.    No facility-administered encounter medications on file as of 05/05/2020.    Review of Systems  Immunization History  Administered Date(s) Administered  . Influenza Inj Mdck Quad Pf 01/05/2016  . Influenza, High Dose Seasonal PF 01/01/2019  . Influenza,inj,Quad PF,6+ Mos 01/07/2018  . Influenza-Unspecified 12/22/2014, 01/10/2017, 01/08/2020  . Moderna SARS-COV2 Booster Vaccination 01/28/2020  . Moderna Sars-Covid-2 Vaccination 03/24/2019, 04/21/2019  . Pneumococcal Conjugate-13 12/14/2013  . Pneumococcal Polysaccharide-23 03/29/2016  . Tdap 03/29/2016  . Zoster 11/03/2007  . Zoster Recombinat (Shingrix) 04/01/2017, 06/27/2017   Pertinent  Health Maintenance Due  Topic Date Due  . PNA vac Low Risk Adult  Completed  . DEXA SCAN  Discontinued   Fall Risk  07/02/2019 04/22/2018  Falls in the past year? 0 0  Number falls in past yr: 0 0  Injury with Fall? 0 0  Risk for fall due to : - Impaired mobility;Medication side effect;Mental  status change;Impaired balance/gait  Follow up - Falls evaluation completed;Education provided  Comment - seen by therapy for all of this   Functional Status Survey:    Vitals:   05/05/20 1439  BP: 111/76  Pulse: 70  Resp: 15  Temp: (!) 97.5 F (36.4 C)  SpO2: 96%  Weight: 145 lb (65.8 kg)  Height: 5\' 5"  (1.651 m)   Body mass index is 24.13 kg/m.   Wt Readings from Last 3 Encounters:  05/05/20 145 lb (65.8 kg)  02/19/20 150 lb 4.8 oz (68.2 kg)  01/08/20 145 lb 3.2 oz (65.9 kg)    Physical Exam  Labs reviewed: Recent Labs    07/06/19 0000  NA 140  140  K 4.1  4.1  CL 105  105  CO2 21  21  BUN 40*  40*  CREATININE 0.9  0.9  CALCIUM 8.5*  8.5*   No results for input(s): AST, ALT, ALKPHOS, BILITOT, PROT, ALBUMIN in the last 8760 hours. Recent Labs    07/06/19 0000  WBC 10.0  10.0  HGB 10.1*  10.1*  HCT 32*  32*  PLT 207  207   Lab Results  Component Value Date   TSH 6.46 (A) 06/08/2019   No results found for: HGBA1C Lab Results  Component Value Date   CHOL 159 02/09/2017   HDL 49 02/09/2017   LDLCALC 96 02/09/2017   TRIG 69 02/09/2017   CHOLHDL 3.2 02/09/2017    Significant Diagnostic Results in last 30 days:  No results found.  Assessment/Plan There are no diagnoses linked to this encounter.

## 2020-05-09 ENCOUNTER — Encounter: Payer: Self-pay | Admitting: Internal Medicine

## 2020-05-19 ENCOUNTER — Encounter: Payer: Self-pay | Admitting: Adult Health

## 2020-05-19 ENCOUNTER — Ambulatory Visit (INDEPENDENT_AMBULATORY_CARE_PROVIDER_SITE_OTHER): Payer: Medicare Other

## 2020-05-19 ENCOUNTER — Non-Acute Institutional Stay (SKILLED_NURSING_FACILITY): Payer: Medicare Other | Admitting: Adult Health

## 2020-05-19 DIAGNOSIS — R1310 Dysphagia, unspecified: Secondary | ICD-10-CM | POA: Diagnosis not present

## 2020-05-19 DIAGNOSIS — R634 Abnormal weight loss: Secondary | ICD-10-CM

## 2020-05-19 DIAGNOSIS — I495 Sick sinus syndrome: Secondary | ICD-10-CM

## 2020-05-19 DIAGNOSIS — F0280 Dementia in other diseases classified elsewhere without behavioral disturbance: Secondary | ICD-10-CM

## 2020-05-19 DIAGNOSIS — I5022 Chronic systolic (congestive) heart failure: Secondary | ICD-10-CM

## 2020-05-19 DIAGNOSIS — G301 Alzheimer's disease with late onset: Secondary | ICD-10-CM | POA: Diagnosis not present

## 2020-05-19 DIAGNOSIS — R7989 Other specified abnormal findings of blood chemistry: Secondary | ICD-10-CM | POA: Diagnosis not present

## 2020-05-19 DIAGNOSIS — D638 Anemia in other chronic diseases classified elsewhere: Secondary | ICD-10-CM

## 2020-05-19 NOTE — Progress Notes (Signed)
Location:  [facility] illustrator of Service:  SNF (31) Provider:   [name], ANP [facility] [phone]  [name], DO  Patient Care Team: [name], DO as PCP - General (Geriatric Medicine) [name], MD as PCP - Cardiology (Cardiology) [name], NP as Nurse Practitioner (Nurse Practitioner)  Extended Emergency Contact Information Primary Emergency Contact: [name] Address: [address]          [place], [place] [address] [name] Home Phone: [phone] Relation: Other Secondary Emergency Contact: [name] [place] Home Phone: [phone] Relation: Daughter  Code Status:  DNR Goals of care: Advanced Directive information Advanced Directives 05/05/2020  Does Patient Have a Medical Advance Directive? Yes  Type of Advance Directive Out of facility DNR (pink MOST or yellow form)  Does patient want to make changes to medical advance directive? No - Patient declined  Copy of Healthcare Power of Attorney in Chart? -  Pre-existing out of facility DNR order (yellow form or pink MOST form) Yellow form placed in chart (order not valid for inpatient use);Pink MOST form placed in chart (order not valid for inpatient use)     Chief Complaint  Patient presents with  . Medical Management of Chronic Issues    HPI:  Pt is a 87 y.o. female seen today for medical management of chronic diseases.    Caregiver reports the resident did not sleep well and therefore is sleepy and more confused today. This happens about once a month.   She has periods of anxiety during personal care but overall is in good spirits.   Her bp runs in the 100/50-60 range. No dizziness reported but due to her dementia this is hard to assess  Weight is trending downward, 7 lbs in the past 3 months  Wt Readings from Last 3 Encounters:  05/19/20 143 lb (64.9 kg)  05/05/20 145 lb (65.8 kg)   02/19/20 150 lb 4.8 oz (68.2 kg)   Currently on a regular diet with puree meat, tolerating well  Eats small amts   Past Medical History:  Diagnosis Date  . CAD (coronary artery disease)   . Chronic low back pain   . Contusion of foot, right 05/15/12  . Dementia (HCC) 10/16/2016   06/25/17 MMSE 18/30 passed clock  . Diverticulitis   . Dyslipidemia   . Dysphagia 12/11/2016  . H/O: hysterectomy   . HTN (hypertension)   . Memory disturbance 05/2012   MMSE 26/30, intact Clock test  . Nonischemic cardiomyopathy (HCC)   . Osteoporosis   . Pulmonary embolism (HCC)   . Right knee pain 05/15/12  . Spinal stenosis   . Ulcer disease   . Weight loss    Past Surgical History:  Procedure Laterality Date  . APPENDECTOMY  2010  . BACK SURGERY    . BIV ICD GENERTAOR CHANGE OUT N/A 05/24/2011   Procedure: BIV ICD GENERTAOR CHANGE OUT;  Surgeon: [name], MD;  Location: [facility] CATH LAB;  Service: Cardiovascular;  Laterality: N/A;  . CATARACT EXTRACTION    . COLON RESECTION  2005  . CORONARY ANGIOPLASTY WITH STENT PLACEMENT  01/2003  . HEMICOLECTOMY    . HIP ARTHROPLASTY Left 04/05/2014   Procedure: ARTHROPLASTY BIPOLAR HIP;  Surgeon: Shelda Pal, MD;  Location: [facility]S;  Service: Orthopedics;  Laterality: Left;  . left shoulder replacement  1991   Dr. [name]  . ORIF HIP FRACTURE Left  09/30/2016   Procedure: OPEN REDUCTION INTERNAL FIXATION PERIPROSTHETIC HIP FRACTURE;  Surgeon: Ollen Gross, MD;  Location: [facility];  Service: Orthopedics;  Laterality: Left;  . right shoulder repair  1985   Dr. [name]  . rotator cuff debridement  1999   Dr. [name]    Allergies  Allergen Reactions  . Arthrotec [Diclofenac-Misoprostol]   . Ibuprofen   . Lactose Intolerance (Gi)   . Milk-Related Compounds Nausea And Vomiting    Cheese  . Other     Grass and ragweed   . Oxycontin [Oxycodone Hcl]   . Pollen Extract   . Verelan [Verapamil]     Outpatient Encounter Medications as of 05/19/2020  Medication Sig   . acetaminophen (TYLENOL) 325 MG tablet Take 650 mg by mouth 3 (three) times daily.  Marland Kitchen albuterol (PROVENTIL HFA;VENTOLIN HFA) 108 (90 Base) MCG/ACT inhaler Inhale 2 puffs into the lungs every 6 (six) hours as needed for wheezing or shortness of breath.  . allopurinol (ZYLOPRIM) 100 MG tablet Take 100 mg by mouth daily.  Marland Kitchen aspirin EC 81 MG tablet Take 81 mg by mouth daily.  . carboxymethylcellul-glycerin (REFRESH OPTIVE) 0.5-0.9 % ophthalmic solution Apply 2 drops to eye 2 (two) times daily.  . carvedilol (COREG) 3.125 MG tablet Take 1.5625 mg by mouth 2 (two) times daily with a meal.   . cyanocobalamin 1000 MCG tablet Take 1,000 mcg by mouth daily.   . diclofenac Sodium (VOLTAREN) 1 % GEL Apply 4 g topically 4 (four) times daily. Left knee  . Docusate Sodium 100 MG capsule Take 100 mg by mouth daily. May crush  . furosemide (LASIX) 20 MG tablet Take 20 mg by mouth every other day.  . melatonin 5 MG TABS Take 5 mg by mouth at bedtime.  . Nutritional Supplements (FEEDING SUPPLEMENT, BOOST BREEZE,) LIQD Take 1 Container by mouth in the morning and at bedtime.   Marland Kitchen omeprazole (PRILOSEC OTC) 20 MG tablet Take 20 mg by mouth 2 (two) times daily.  . polyethylene glycol (MIRALAX / GLYCOLAX) packet Take 17 g by mouth every other day. Hold for loose stools  . potassium chloride (KLOR-CON) 10 MEQ tablet Take 10 mEq by mouth every other day.  . sertraline (ZOLOFT) 25 MG tablet Take 25 mg by mouth at bedtime.  . Zinc Oxide 13 % CREA Apply topically as needed (after each bathroom use).   No facility-administered encounter medications on file as of 05/19/2020.    Review of Systems  Unable to perform ROS: Dementia    Immunization History  Administered Date(s) Administered  . Influenza Inj Mdck Quad Pf 01/05/2016  . Influenza, High Dose Seasonal PF 01/01/2019  . Influenza,inj,Quad PF,6+ Mos 01/07/2018  . Influenza-Unspecified 12/22/2014, 01/10/2017, 01/08/2020  . Moderna SARS-COV2 Booster Vaccination  01/28/2020  . Moderna Sars-Covid-2 Vaccination 03/24/2019, 04/21/2019  . Pneumococcal Conjugate-13 12/14/2013  . Pneumococcal Polysaccharide-23 03/29/2016  . Tdap 03/29/2016  . Zoster 11/03/2007  . Zoster Recombinat (Shingrix) 04/01/2017, 06/27/2017   Pertinent  Health Maintenance Due  Topic Date Due  . PNA vac Low Risk Adult  Completed  . DEXA SCAN  Discontinued   Fall Risk  07/02/2019 04/22/2018  Falls in the past year? 0 0  Number falls in past yr: 0 0  Injury with Fall? 0 0  Risk for fall due to : - Impaired mobility;Medication side effect;Mental status change;Impaired balance/gait  Follow up - Falls evaluation completed;Education provided  Comment - seen by therapy for all of this   Functional Status Survey:  Vitals:   05/19/20 0926  Weight: 143 lb (64.9 kg)   Body mass index is 23.8 kg/m. Physical Exam Vitals and nursing note reviewed.  Constitutional:      General: She is not in acute distress.    Appearance: She is not diaphoretic.  HENT:     Head: Normocephalic and atraumatic.     Nose: Nose normal. No congestion.     Mouth/Throat:     Mouth: Mucous membranes are moist.     Pharynx: Oropharynx is clear.  Neck:     Vascular: No JVD.  Cardiovascular:     Rate and Rhythm: Normal rate and regular rhythm.     Heart sounds: No murmur heard.   Pulmonary:     Effort: Pulmonary effort is normal. No respiratory distress.     Breath sounds: Rales present. No wheezing.  Abdominal:     General: Abdomen is flat. Bowel sounds are normal. There is no distension.     Palpations: Abdomen is soft.     Tenderness: There is no abdominal tenderness.  Musculoskeletal:        General: No swelling, tenderness, deformity or signs of injury.     Right lower leg: No edema.     Left lower leg: No edema.  Skin:    General: Skin is warm and dry.     Coloration: Skin is not jaundiced or pale.     Findings: No bruising, erythema, lesion or rash.  Neurological:     General: No  focal deficit present.     Comments: Drowsy but easily arouses. Mumbles. Able to f/c.  MAE      Labs reviewed: Recent Labs    07/06/19 0000  NA 140  140  K 4.1  4.1  CL 105  105  CO2 21  21  BUN 40*  40*  CREATININE 0.9  0.9  CALCIUM 8.5*  8.5*   No results for input(s): AST, ALT, ALKPHOS, BILITOT, PROT, ALBUMIN in the last 8760 hours. Recent Labs    07/06/19 0000  WBC 10.0  10.0  HGB 10.1*  10.1*  HCT 32*  32*  PLT 207  207   Lab Results  Component Value Date   TSH 6.46 (A) 06/08/2019   No results found for: HGBA1C Lab Results  Component Value Date   CHOL 159 02/09/2017   HDL 49 02/09/2017   LDLCALC 96 02/09/2017   TRIG 69 02/09/2017   CHOLHDL 3.2 02/09/2017    Significant Diagnostic Results in last 30 days:  No results found.  Assessment/Plan  1. Weight loss Likely due to progressive dementia with decreased intake and age of 89  2. Late onset Alzheimer's disease without behavioral disturbance (HCC) Progressing over the past year Continues to benefit from skilled care DNR in place No hospitalizations.   3. Dysphagia, unspecified type Continue current diet Make sure sure is alert and upright for all meals.   4. Elevated TSH Will recheck due to weight loss and sleep issues Due to her age and dementia would not treat unless and elevated with attributable symptoms   5. Anemia of chronic disease Lab Results  Component Value Date   HGB 10.1 (A) 07/06/2019   HGB 10.1 (A) 07/06/2019   No current symptoms, continue to monitor.   6. Chronic systolic heart failure (HCC) Weight trending down with borderline bp Decrease Lasix to 20 mg qod and Kdur to 10 meq qod     Family/ staff Communication: nurse  Labs/tests ordered:  TSH

## 2020-05-23 DIAGNOSIS — Z79899 Other long term (current) drug therapy: Secondary | ICD-10-CM | POA: Diagnosis not present

## 2020-05-23 LAB — CUP PACEART REMOTE DEVICE CHECK
Battery Impedance: 2537 Ohm
Battery Remaining Longevity: 21 mo
Battery Voltage: 2.73 V
Brady Statistic AP VP Percent: 67 %
Brady Statistic AP VS Percent: 0 %
Brady Statistic AS VP Percent: 33 %
Brady Statistic AS VS Percent: 0 %
Date Time Interrogation Session: 20220306123953
Implantable Lead Implant Date: 20071005
Implantable Lead Implant Date: 20071005
Implantable Lead Location: 753858
Implantable Lead Location: 753859
Implantable Lead Model: 4194
Implantable Lead Model: 5076
Implantable Pulse Generator Implant Date: 20130307
Lead Channel Impedance Value: 433 Ohm
Lead Channel Impedance Value: 860 Ohm
Lead Channel Pacing Threshold Amplitude: 0.75 V
Lead Channel Pacing Threshold Amplitude: 1.125 V
Lead Channel Pacing Threshold Pulse Width: 0.4 ms
Lead Channel Pacing Threshold Pulse Width: 0.4 ms
Lead Channel Setting Pacing Amplitude: 2 V
Lead Channel Setting Pacing Amplitude: 3.25 V
Lead Channel Setting Pacing Pulse Width: 0.4 ms
Lead Channel Setting Sensing Sensitivity: 4 mV

## 2020-05-23 LAB — TSH: TSH: 4.48 (ref 0.41–5.90)

## 2020-05-26 ENCOUNTER — Encounter: Payer: Self-pay | Admitting: Adult Health

## 2020-05-26 ENCOUNTER — Non-Acute Institutional Stay (SKILLED_NURSING_FACILITY): Payer: Medicare Other | Admitting: Adult Health

## 2020-05-26 DIAGNOSIS — M25561 Pain in right knee: Secondary | ICD-10-CM | POA: Diagnosis not present

## 2020-05-26 DIAGNOSIS — L84 Corns and callosities: Secondary | ICD-10-CM

## 2020-05-26 NOTE — Progress Notes (Signed)
Location:  Medical illustrator of Service:  SNF (31) Provider:   [name], ANP [facility] [phone]   [name], DO  Patient Care Team: [name], DO as PCP - General (Geriatric Medicine) [name], MD as PCP - Cardiology (Cardiology) [name], NP as Nurse Practitioner (Nurse Practitioner)  Extended Emergency Contact Information Primary Emergency Contact: [name] Address: [address], [address]          [place], [place] [address] [name] Home Phone: [phone] Relation: Other Secondary Emergency Contact: [name] [place] Home Phone: [phone] Relation: Daughter  Code Status:  DNR Goals of care: Advanced Directive information Advanced Directives 05/05/2020  Does Patient Have a Medical Advance Directive? Yes  Type of Advance Directive Out of facility DNR (pink MOST or yellow form)  Does patient want to make changes to medical advance directive? No - Patient declined  Copy of Healthcare Power of Attorney in Chart? -  Pre-existing out of facility DNR order (yellow form or pink MOST form) Yellow form placed in chart (order not valid for inpatient use);Pink MOST form placed in chart (order not valid for inpatient use)     Chief Complaint  Patient presents with  . Acute Visit    Left great toe redness and right knee pain     HPI:  Pt is a 87 y.o. female seen today for an acute visit for left great toe redness and right knee pain. Symptoms noted on 3/9.  She has a callus to the top of her left great toe with some surrounding redness and tenderness. No purulent drainage or fever. She also has right knee pain. She is not ambulatory and due to her dementia she can not contribute to the hx. She has a hx of gout. The knee is not red or warm and has no swelling per the nurse. No injury reported. She has chronic right knee pain and uses voltaren gel which was helpful.     Past Medical History:  Diagnosis Date  . CAD (coronary artery disease)   . Chronic low back pain   . Contusion of foot, right 05/15/12  . Dementia (HCC) 10/16/2016   06/25/17 MMSE 18/30 passed clock  . Diverticulitis   . Dyslipidemia   . Dysphagia 12/11/2016  . H/O: hysterectomy   . HTN (hypertension)   . Memory disturbance 05/2012   MMSE 26/30, intact Clock test  . Nonischemic cardiomyopathy (HCC)   . Osteoporosis   . Pulmonary embolism (HCC)   . Right knee pain 05/15/12  . Spinal stenosis   . Ulcer disease   . Weight loss    Past Surgical History:  Procedure Laterality Date  . APPENDECTOMY  2010  . BACK SURGERY    . BIV ICD GENERTAOR CHANGE OUT N/A 05/24/2011   Procedure: BIV ICD GENERTAOR CHANGE OUT;  Surgeon: [name], MD;  Location: [facility] CATH LAB;  Service: Cardiovascular;  Laterality: N/A;  . CATARACT EXTRACTION    . COLON RESECTION  2005  . CORONARY ANGIOPLASTY WITH STENT PLACEMENT  01/2003  . HEMICOLECTOMY    . HIP ARTHROPLASTY Left 04/05/2014   Procedure: ARTHROPLASTY BIPOLAR HIP;  Surgeon: Shelda Pal, MD;  Location: [facility] ORS;  Service: Orthopedics;  Laterality: Left;  . left shoulder replacement  1991   Dr. [name]  . ORIF HIP FRACTURE Left 09/30/2016   Procedure: OPEN REDUCTION INTERNAL FIXATION PERIPROSTHETIC HIP FRACTURE;  Surgeon: [name],  [name], MD;  Location: [facility] ORS;  Service: Orthopedics;  Laterality: Left;  . right shoulder repair  1985   Dr. [name]  . rotator cuff debridement  1999   Dr. [name]    Allergies  Allergen Reactions  . Arthrotec [Diclofenac-Misoprostol]   . Ibuprofen   . Lactose Intolerance (Gi)   . Milk-Related Compounds Nausea And Vomiting    Cheese  . Other     Grass and ragweed   . Oxycontin [Oxycodone Hcl]   . Pollen Extract   . Verelan [Verapamil]     Outpatient Encounter Medications as of 05/26/2020  Medication Sig  . acetaminophen (TYLENOL) 325 MG tablet Take 650 mg by mouth 3 (three) times daily.  Marland Kitchen albuterol (PROVENTIL  HFA;VENTOLIN HFA) 108 (90 Base) MCG/ACT inhaler Inhale 2 puffs into the lungs every 6 (six) hours as needed for wheezing or shortness of breath.  . allopurinol (ZYLOPRIM) 100 MG tablet Take 100 mg by mouth daily.  Marland Kitchen aspirin EC 81 MG tablet Take 81 mg by mouth daily.  . carboxymethylcellul-glycerin (REFRESH OPTIVE) 0.5-0.9 % ophthalmic solution Apply 2 drops to eye 2 (two) times daily.  . carvedilol (COREG) 3.125 MG tablet Take 1.5625 mg by mouth 2 (two) times daily with a meal.   . cyanocobalamin 1000 MCG tablet Take 1,000 mcg by mouth daily.   . diclofenac Sodium (VOLTAREN) 1 % GEL Apply 4 g topically 4 (four) times daily. Both knees  . Docusate Sodium 100 MG capsule Take 100 mg by mouth daily. May crush  . furosemide (LASIX) 20 MG tablet Take 20 mg by mouth every other day.  . melatonin 5 MG TABS Take 5 mg by mouth at bedtime.  . Nutritional Supplements (FEEDING SUPPLEMENT, BOOST BREEZE,) LIQD Take 1 Container by mouth in the morning and at bedtime.   Marland Kitchen omeprazole (PRILOSEC OTC) 20 MG tablet Take 20 mg by mouth 2 (two) times daily.  . polyethylene glycol (MIRALAX / GLYCOLAX) packet Take 17 g by mouth every other day. Hold for loose stools  . potassium chloride (KLOR-CON) 10 MEQ tablet Take 10 mEq by mouth every other day.  . sertraline (ZOLOFT) 25 MG tablet Take 25 mg by mouth at bedtime.  . Zinc Oxide 13 % CREA Apply topically as needed (after each bathroom use).   No facility-administered encounter medications on file as of 05/26/2020.    Review of Systems  Unable to perform ROS: Dementia    Immunization History  Administered Date(s) Administered  . Influenza Inj Mdck Quad Pf 01/05/2016  . Influenza, High Dose Seasonal PF 01/01/2019  . Influenza,inj,Quad PF,6+ Mos 01/07/2018  . Influenza-Unspecified 12/22/2014, 01/10/2017, 01/08/2020  . Moderna SARS-COV2 Booster Vaccination 01/28/2020  . Moderna Sars-Covid-2 Vaccination 03/24/2019, 04/21/2019  . Pneumococcal Conjugate-13 12/14/2013   . Pneumococcal Polysaccharide-23 03/29/2016  . Tdap 03/29/2016  . Zoster 11/03/2007  . Zoster Recombinat (Shingrix) 04/01/2017, 06/27/2017   Pertinent  Health Maintenance Due  Topic Date Due  . PNA vac Low Risk Adult  Completed  . DEXA SCAN  Discontinued   Fall Risk  07/02/2019 04/22/2018  Falls in the past year? 0 0  Number falls in past yr: 0 0  Injury with Fall? 0 0  Risk for fall due to : - Impaired mobility;Medication side effect;Mental status change;Impaired balance/gait  Follow up - Falls evaluation completed;Education provided  Comment - seen by therapy for all of this   Functional Status Survey:    Vitals:   05/26/20 1553  BP: 112/69  Pulse: 67  Resp: 20  Temp: 98.1 F (36.7 C)  SpO2: 93%   There is no height or weight on file to calculate BMI. Physical Exam Constitutional:      Appearance: Normal appearance.  Cardiovascular:     Rate and Rhythm: Normal rate and regular rhythm.  Pulmonary:     Effort: Pulmonary effort is normal.     Breath sounds: Normal breath sounds.  Musculoskeletal:        General: Tenderness (anterior left great toe. ) present. No swelling, deformity or signs of injury.     Right knee: No swelling, deformity, effusion, erythema, ecchymosis, lacerations, bony tenderness or crepitus. Decreased range of motion. No tenderness. Normal alignment, normal meniscus and normal patellar mobility.     Right lower leg: No edema.     Left lower leg: No edema.     Comments: Pain with PROM of the right knee   Skin:    General: Skin is warm and dry.     Comments: Left great toe with callus and surrounding erythema. No purulent drainage. No erythema at joint line.   Neurological:     General: No focal deficit present.     Mental Status: She is alert. Mental status is at baseline.     Labs reviewed: Recent Labs    07/06/19 0000  NA 140  140  K 4.1  4.1  CL 105  105  CO2 21  21  BUN 40*  40*  CREATININE 0.9  0.9  CALCIUM 8.5*  8.5*    No results for input(s): AST, ALT, ALKPHOS, BILITOT, PROT, ALBUMIN in the last 8760 hours. Recent Labs    07/06/19 0000  WBC 10.0  10.0  HGB 10.1*  10.1*  HCT 32*  32*  PLT 207  207   Lab Results  Component Value Date   TSH 6.46 (A) 06/08/2019   No results found for: HGBA1C Lab Results  Component Value Date   CHOL 159 02/09/2017   HDL 49 02/09/2017   LDLCALC 96 02/09/2017   TRIG 69 02/09/2017   CHOLHDL 3.2 02/09/2017    Significant Diagnostic Results in last 30 days:  CUP PACEART REMOTE DEVICE CHECK  Result Date: 05/23/2020 Scheduled remote reviewed. Normal device function.  15 new AMS events all <1 minute each. No EGMs available for review. Next remote 91 days. HB   Assessment/Plan 1. Foot callus Noted to left great toe  With mild surrounding erythema and tenderness Foot soaks epsom salt with warm water bid x 7 days Bactroban with non adherent dressing to the left great toe bid x 7 days If no improvement or worsening.  may need antibiotic course.   2. Acute pain of right knee No redness, warmth or swelling. Will add voltaren gel to the right knee, which is already being used on the left knee. If s/s of inflammation begin would recommend treatment for gout. Continue scheduled tylenol.     Family/ staff Communication: nurse  Labs/tests ordered:  NA

## 2020-05-30 NOTE — Progress Notes (Signed)
Remote pacemaker transmission.   

## 2020-06-01 ENCOUNTER — Encounter: Payer: Self-pay | Admitting: *Deleted

## 2020-06-06 ENCOUNTER — Encounter: Payer: Self-pay | Admitting: Adult Health

## 2020-06-06 ENCOUNTER — Non-Acute Institutional Stay (SKILLED_NURSING_FACILITY): Payer: Medicare Other | Admitting: Adult Health

## 2020-06-06 DIAGNOSIS — M25551 Pain in right hip: Secondary | ICD-10-CM | POA: Diagnosis not present

## 2020-06-06 DIAGNOSIS — M25561 Pain in right knee: Secondary | ICD-10-CM | POA: Diagnosis not present

## 2020-06-06 MED ORDER — TRAMADOL HCL 50 MG PO TABS: 25.0000 mg | ORAL_TABLET | Freq: Three times a day (TID) | ORAL | 0 refills | Status: DC | PRN

## 2020-06-06 MED ORDER — TRAMADOL HCL 50 MG PO TABS: 25.0000 mg | ORAL_TABLET | Freq: Every day | ORAL | 0 refills | Status: DC

## 2020-06-06 NOTE — Progress Notes (Signed)
Location:  Medical illustrator of Service:  SNF (31) Provider:   [name], ANP [facility] [phone]  [name], DO  Patient Care Team: [name], DO as PCP - General (Geriatric Medicine) [name], MD as PCP - Cardiology (Cardiology) [name], NP as Nurse Practitioner (Nurse Practitioner)  Extended Emergency Contact Information Primary Emergency Contact: [name] Address: [address]          [place], [place] [address] [name] Home Phone: [phone] Relation: Other Secondary Emergency Contact: [name] [place] Home Phone: [phone] Relation: Daughter  Code Status:  DNR Goals of care: Advanced Directive information Advanced Directives 05/05/2020  Does Patient Have a Medical Advance Directive? Yes  Type of Advance Directive Out of facility DNR (pink MOST or yellow form)  Does patient want to make changes to medical advance directive? No - Patient declined  Copy of Healthcare Power of Attorney in Chart? -  Pre-existing out of facility DNR order (yellow form or pink MOST form) Yellow form placed in chart (order not valid for inpatient use);Pink MOST form placed in chart (order not valid for inpatient use)     Chief Complaint  Patient presents with  . Acute Visit    Right hip pain    HPI:  Pt is a 87 y.o. female seen today for an acute visit for right hip pain. Symptoms present for several days per the staff. She has dementia and can not provide a hx. Her caregiver reports that she is tearful at times due to pain noted with turning or getting out of bed. She is non ambulatory and uses the hoyer lift for transfers. No recent falls or bruising. She was treated for right knee pain with voltaren gel and then later the right knee became more red and swollen and she was given colchicine for two days which helped. Uric acid 08/28/19 4.4 on allopurinol  I  spoke with her daughter in law and she reports that the allergy to nsaids is really nausea. They are not sure of any allergy to codeine, but she may have had confusion or nausea with it. Her family's goal is to keep her comfortable.  The caregiver also reports that she did not sleep well last night.   Past Medical History:  Diagnosis Date  . CAD (coronary artery disease)   . Chronic low back pain   . Contusion of foot, right 05/15/12  . Dementia (HCC) 10/16/2016   06/25/17 MMSE 18/30 passed clock  . Diverticulitis   . Dyslipidemia   . Dysphagia 12/11/2016  . H/O: hysterectomy   . HTN (hypertension)   . Memory disturbance 05/2012   MMSE 26/30, intact Clock test  . Nonischemic cardiomyopathy (HCC)   . Osteoporosis   . Pulmonary embolism (HCC)   . Right knee pain 05/15/12  . Spinal stenosis   . Ulcer disease   . Weight loss    Past Surgical History:  Procedure Laterality Date  . APPENDECTOMY  2010  . BACK SURGERY    . BIV ICD GENERTAOR CHANGE OUT N/A 05/24/2011   Procedure: BIV ICD GENERTAOR CHANGE OUT;  Surgeon: [name], MD;  Location: [facility] CATH LAB;  Service: Cardiovascular;  Laterality: N/A;  . CATARACT EXTRACTION    . COLON RESECTION  2005  . CORONARY ANGIOPLASTY WITH STENT PLACEMENT  01/2003  . HEMICOLECTOMY    . HIP ARTHROPLASTY Left 04/05/2014   Procedure:  ARTHROPLASTY BIPOLAR HIP;  Surgeon: Shelda Pal, MD;  Location: [facility];  Service: Orthopedics;  Laterality: Left;  . left shoulder replacement  1991   Dr. [name]  . ORIF HIP FRACTURE Left 09/30/2016   Procedure: OPEN REDUCTION INTERNAL FIXATION PERIPROSTHETIC HIP FRACTURE;  Surgeon: Ollen Gross, MD;  Location: [facility];  Service: Orthopedics;  Laterality: Left;  . right shoulder repair  1985   Dr. [name]  . rotator cuff debridement  1999   Dr. [name]    Allergies  Allergen Reactions  . Arthrotec [Diclofenac-Misoprostol]   . Ibuprofen   . Lactose Intolerance (Gi)   . Milk-Related Compounds Nausea And Vomiting     Cheese  . Other     Grass and ragweed   . Oxycontin [Oxycodone Hcl]   . Pollen Extract   . Verelan [Verapamil]     Outpatient Encounter Medications as of 06/06/2020  Medication Sig  . [DISCONTINUED] traMADol (ULTRAM) 50 MG tablet Take 25 mg by mouth every 8 (eight) hours as needed.  . [DISCONTINUED] traMADol (ULTRAM) 50 MG tablet Take 25 mg by mouth at bedtime.  Marland Kitchen acetaminophen (TYLENOL) 325 MG tablet Take 650 mg by mouth 3 (three) times daily.  Marland Kitchen albuterol (PROVENTIL HFA;VENTOLIN HFA) 108 (90 Base) MCG/ACT inhaler Inhale 2 puffs into the lungs every 6 (six) hours as needed for wheezing or shortness of breath.  . allopurinol (ZYLOPRIM) 100 MG tablet Take 100 mg by mouth daily.  Marland Kitchen aspirin EC 81 MG tablet Take 81 mg by mouth daily.  . carboxymethylcellul-glycerin (REFRESH OPTIVE) 0.5-0.9 % ophthalmic solution Apply 2 drops to eye 2 (two) times daily.  . carvedilol (COREG) 3.125 MG tablet Take 1.5625 mg by mouth 2 (two) times daily with a meal.   . cyanocobalamin 1000 MCG tablet Take 1,000 mcg by mouth daily.   . diclofenac Sodium (VOLTAREN) 1 % GEL Apply 4 g topically 4 (four) times daily. Both knees  . Docusate Sodium 100 MG capsule Take 100 mg by mouth daily. May crush  . furosemide (LASIX) 20 MG tablet Take 20 mg by mouth every other day.  . melatonin 5 MG TABS Take 5 mg by mouth at bedtime.  . Nutritional Supplements (FEEDING SUPPLEMENT, BOOST BREEZE,) LIQD Take 1 Container by mouth in the morning and at bedtime.   Marland Kitchen omeprazole (PRILOSEC OTC) 20 MG tablet Take 20 mg by mouth 2 (two) times daily.  . polyethylene glycol (MIRALAX / GLYCOLAX) packet Take 17 g by mouth every other day. Hold for loose stools  . potassium chloride (KLOR-CON) 10 MEQ tablet Take 10 mEq by mouth every other day.  . sertraline (ZOLOFT) 25 MG tablet Take 25 mg by mouth at bedtime.  . traMADol (ULTRAM) 50 MG tablet Take 0.5 tablets (25 mg total) by mouth every 8 (eight) hours as needed.  . traMADol (ULTRAM) 50 MG  tablet Take 0.5 tablets (25 mg total) by mouth at bedtime.  . Zinc Oxide 13 % CREA Apply topically as needed (after each bathroom use).   No facility-administered encounter medications on file as of 06/06/2020.    Review of Systems  Unable to perform ROS: Dementia    Immunization History  Administered Date(s) Administered  . Influenza Inj Mdck Quad Pf 01/05/2016  . Influenza, High Dose Seasonal PF 01/01/2019  . Influenza,inj,Quad PF,6+ Mos 01/07/2018  . Influenza-Unspecified 12/22/2014, 01/10/2017, 01/08/2020  . Moderna SARS-COV2 Booster Vaccination 01/28/2020  . Moderna Sars-Covid-2 Vaccination 03/24/2019, 04/21/2019  . Pneumococcal Conjugate-13 12/14/2013  . Pneumococcal Polysaccharide-23 03/29/2016  .  Tdap 03/29/2016  . Zoster 11/03/2007  . Zoster Recombinat (Shingrix) 04/01/2017, 06/27/2017   Pertinent  Health Maintenance Due  Topic Date Due  . PNA vac Low Risk Adult  Completed  . DEXA SCAN  Discontinued   Fall Risk  07/02/2019 04/22/2018  Falls in the past year? 0 0  Number falls in past yr: 0 0  Injury with Fall? 0 0  Risk for fall due to : - Impaired mobility;Medication side effect;Mental status change;Impaired balance/gait  Follow up - Falls evaluation completed;Education provided  Comment - seen by therapy for all of this   Functional Status Survey:    There were no vitals filed for this visit. There is no height or weight on file to calculate BMI. Physical Exam Vitals and nursing note reviewed.  Constitutional:      Appearance: Normal appearance.  Musculoskeletal:        General: No swelling, tenderness, deformity or signs of injury.     Right lower leg: No edema.     Left lower leg: Normal. No edema.       Legs:  Skin:    General: Skin is warm and dry.     Coloration: Skin is not jaundiced or pale.     Findings: No bruising, erythema, lesion or rash.  Neurological:     Mental Status: She is alert. Mental status is at baseline.     Comments: Rapidly  talking. Not able to answer q's or f/c. No obvious focal deficit.      Labs reviewed: Recent Labs    07/06/19 0000  NA 140  140  K 4.1  4.1  CL 105  105  CO2 21  21  BUN 40*  40*  CREATININE 0.9  0.9  CALCIUM 8.5*  8.5*   No results for input(s): AST, ALT, ALKPHOS, BILITOT, PROT, ALBUMIN in the last 8760 hours. Recent Labs    07/06/19 0000  WBC 10.0  10.0  HGB 10.1*  10.1*  HCT 32*  32*  PLT 207  207   Lab Results  Component Value Date   TSH 4.48 05/23/2020   No results found for: HGBA1C Lab Results  Component Value Date   CHOL 159 02/09/2017   HDL 49 02/09/2017   LDLCALC 96 02/09/2017   TRIG 69 02/09/2017   CHOLHDL 3.2 02/09/2017    Significant Diagnostic Results in last 30 days:  CUP PACEART REMOTE DEVICE CHECK  Result Date: 05/23/2020 Scheduled remote reviewed. Normal device function.  15 new AMS events all <1 minute each. No EGMs available for review. Next remote 91 days. HB   Assessment/Plan 1. Right hip pain Add ultram 25 mg qhs and q 8 prn pain  2. Acute pain of right knee Resolved with colchicine and Voltaren gel    Family/ staff Communication: discussed with Renella Cunas   Labs/tests ordered:  NA

## 2020-06-21 ENCOUNTER — Encounter: Payer: Self-pay | Admitting: Orthopedic Surgery

## 2020-06-21 ENCOUNTER — Non-Acute Institutional Stay (SKILLED_NURSING_FACILITY): Payer: Medicare Other | Admitting: Orthopedic Surgery

## 2020-06-21 DIAGNOSIS — R7989 Other specified abnormal findings of blood chemistry: Secondary | ICD-10-CM

## 2020-06-21 DIAGNOSIS — R1310 Dysphagia, unspecified: Secondary | ICD-10-CM | POA: Diagnosis not present

## 2020-06-21 DIAGNOSIS — R634 Abnormal weight loss: Secondary | ICD-10-CM | POA: Diagnosis not present

## 2020-06-21 DIAGNOSIS — G301 Alzheimer's disease with late onset: Secondary | ICD-10-CM

## 2020-06-21 DIAGNOSIS — F0280 Dementia in other diseases classified elsewhere without behavioral disturbance: Secondary | ICD-10-CM

## 2020-06-21 DIAGNOSIS — D638 Anemia in other chronic diseases classified elsewhere: Secondary | ICD-10-CM

## 2020-06-21 DIAGNOSIS — I5022 Chronic systolic (congestive) heart failure: Secondary | ICD-10-CM

## 2020-06-21 NOTE — Progress Notes (Signed)
[facility]   [facility] Room Number: 111-A Place of Service:  SNF [phone]) Provider:  Hazle Nordmann, NP    Patient Care Team: [name], DO as PCP - General (Geriatric Medicine) [name], MD as PCP - Cardiology (Cardiology) [name], NP as Nurse Practitioner (Nurse Practitioner)  Extended Emergency Contact Information Primary Emergency Contact: [name] Address: [address]          [place], [place] [address] [name] Home Phone: [phone] Relation: Other Secondary Emergency Contact: [name] [place] Home Phone: [phone] Relation: Daughter  Code Status:  DNR Goals of care: Advanced Directive information Advanced Directives 06/21/2020  Does Patient Have a Medical Advance Directive? Yes  Type of Advance Directive Out of facility DNR (pink MOST or yellow form)  Does patient want to make changes to medical advance directive? No - Patient declined  Copy of Healthcare Power of Attorney in Chart? -  Pre-existing out of facility DNR order (yellow form or pink MOST form) -     Chief Complaint  Patient presents with  . Medical Management of Chronic Issues    Routine Visit.    HPI:  Pt is a 87 y.o. female seen today for medical management of chronic diseases.    Caregiver present during encounter. She continues to sleep most of the day and intake less food. Sometimes skipping meals, appears to eat one meal > 50% and < 25% with others. At times she has spit out food and medications. Remains on regular diet with puree meats, thin liquids. Boost given daily bid. No recent episodes of anxiety or behavioral outbursts. Remains dependent of all ADLs.   Weekly blood pressures are as follows:  03/22- 101/57  03/15- 127/80  03/08- 113/70  Recent weight not reported.     Past Medical History:  Diagnosis Date  . CAD (coronary artery disease)   . Chronic low back pain   .  Contusion of foot, right 05/15/12  . Dementia (HCC) 10/16/2016   06/25/17 MMSE 18/30 passed clock  . Diverticulitis   . Dyslipidemia   . Dysphagia 12/11/2016  . H/O: hysterectomy   . HTN (hypertension)   . Memory disturbance 05/2012   MMSE 26/30, intact Clock test  . Nonischemic cardiomyopathy (HCC)   . Osteoporosis   . Pulmonary embolism (HCC)   . Right knee pain 05/15/12  . Spinal stenosis   . Ulcer disease   . Weight loss    Past Surgical History:  Procedure Laterality Date  . APPENDECTOMY  2010  . BACK SURGERY    . BIV ICD GENERTAOR CHANGE OUT N/A 05/24/2011   Procedure: BIV ICD GENERTAOR CHANGE OUT;  Surgeon: [name], MD;  Location: [facility] CATH LAB;  Service: Cardiovascular;  Laterality: N/A;  . CATARACT EXTRACTION    . COLON RESECTION  2005  . CORONARY ANGIOPLASTY WITH STENT PLACEMENT  01/2003  . HEMICOLECTOMY    . HIP ARTHROPLASTY Left 04/05/2014   Procedure: ARTHROPLASTY BIPOLAR HIP;  Surgeon: Shelda Pal, MD;  Location: [facility];  Service: Orthopedics;  Laterality: Left;  . left shoulder replacement  1991   Dr. [name]  . ORIF HIP FRACTURE Left 09/30/2016   Procedure: OPEN REDUCTION INTERNAL FIXATION PERIPROSTHETIC HIP FRACTURE;  Surgeon: Ollen Gross, MD;  Location: [facility];  Service: Orthopedics;  Laterality: Left;  . right shoulder repair  1985   Dr. [name]  . rotator cuff debridement  1999  Dr. [name]    Allergies  Allergen Reactions  . Arthrotec [Diclofenac-Misoprostol] Nausea Only  . Ibuprofen Nausea Only  . Lactose Intolerance (Gi)   . Milk-Related Compounds Nausea And Vomiting    Cheese  . Other     Grass and ragweed   . Oxycontin [Oxycodone Hcl] Nausea Only  . Pollen Extract   . Verelan [Verapamil]     Allergies as of 06/21/2020      Reactions   Arthrotec [diclofenac-misoprostol] Nausea Only   Ibuprofen Nausea Only   Lactose Intolerance (gi)    Milk-related Compounds Nausea And Vomiting   Cheese   Other    Grass and ragweed    Oxycontin [oxycodone  Hcl] Nausea Only   Pollen Extract    Verelan [verapamil]       Medication List       Accurate as of June 21, 2020 11:51 AM. If you have any questions, ask your nurse or doctor.        acetaminophen 325 MG tablet Commonly known as: TYLENOL Take 650 mg by mouth 3 (three) times daily.   albuterol 108 (90 Base) MCG/ACT inhaler Commonly known as: VENTOLIN HFA Inhale 2 puffs into the lungs every 6 (six) hours as needed for wheezing or shortness of breath.   allopurinol 100 MG tablet Commonly known as: ZYLOPRIM Take 100 mg by mouth daily.   aspirin EC 81 MG tablet Take 81 mg by mouth daily.   carboxymethylcellul-glycerin 0.5-0.9 % ophthalmic solution Commonly known as: REFRESH OPTIVE Apply 2 drops to eye 2 (two) times daily.   carvedilol 3.125 MG tablet Commonly known as: COREG Take 1.5625 mg by mouth 2 (two) times daily with a meal.   chlorhexidine 0.12 % solution Commonly known as: PERIDEX Use as directed 15 mLs in the mouth or throat 2 (two) times daily.   cyanocobalamin 1000 MCG tablet Take 1,000 mcg by mouth daily.   diclofenac Sodium 1 % Gel Commonly known as: VOLTAREN Apply 4 g topically 4 (four) times daily. Both knees   Docusate Sodium 100 MG capsule Take 100 mg by mouth daily. May crush   feeding supplement (BOOST BREEZE) Liqd Take 1 Container by mouth in the morning and at bedtime.   furosemide 20 MG tablet Commonly known as: LASIX Take 20 mg by mouth every other day.   melatonin 5 MG Tabs Take 5 mg by mouth at bedtime.   omeprazole 20 MG tablet Commonly known as: PRILOSEC OTC Take 20 mg by mouth 2 (two) times daily.   polyethylene glycol 17 g packet Commonly known as: MIRALAX / GLYCOLAX Take 17 g by mouth every other day. Hold for loose stools   potassium chloride 10 MEQ tablet Commonly known as: KLOR-CON Take 10 mEq by mouth every other day.   sertraline 25 MG tablet Commonly known as: ZOLOFT Take 25 mg by mouth at bedtime.   traMADol  50 MG tablet Commonly known as: ULTRAM Take 0.5 tablets (25 mg total) by mouth every 8 (eight) hours as needed.   traMADol 50 MG tablet Commonly known as: ULTRAM Take 0.5 tablets (25 mg total) by mouth at bedtime.   Zinc Oxide 13 % Crea Apply topically as needed (after each bathroom use).       Review of Systems  Unable to perform ROS: Dementia    Immunization History  Administered Date(s) Administered  . Influenza Inj Mdck Quad Pf 01/05/2016  . Influenza, High Dose Seasonal PF 01/01/2019  . Influenza,inj,Quad PF,6+ Mos 01/07/2018  .  Influenza-Unspecified 12/22/2014, 01/10/2017, 01/08/2020  . Moderna SARS-COV2 Booster Vaccination 01/28/2020  . Moderna Sars-Covid-2 Vaccination 03/24/2019, 04/21/2019  . Pneumococcal Conjugate-13 12/14/2013  . Pneumococcal Polysaccharide-23 03/29/2016  . Tdap 03/29/2016  . Zoster 11/03/2007  . Zoster Recombinat (Shingrix) 04/01/2017, 06/27/2017   Pertinent  Health Maintenance Due  Topic Date Due  . PNA vac Low Risk Adult  Completed  . DEXA SCAN  Discontinued   Fall Risk  07/02/2019 04/22/2018  Falls in the past year? 0 0  Number falls in past yr: 0 0  Injury with Fall? 0 0  Risk for fall due to : - Impaired mobility;Medication side effect;Mental status change;Impaired balance/gait  Follow up - Falls evaluation completed;Education provided  Comment - seen by therapy for all of this   Functional Status Survey:    Vitals:   06/21/20 1144  BP: (!) 101/57  Pulse: 71  Resp: 16  Temp: (!) 97 F (36.1 C)  SpO2: 96%  Weight: 146 lb (66.2 kg)  Height: 5\' 5"  (1.651 m)   Body mass index is 24.3 kg/m. Physical Exam Vitals reviewed.  HENT:     Head: Normocephalic.     Right Ear: There is no impacted cerumen.     Left Ear: There is no impacted cerumen.     Nose: Nose normal.     Mouth/Throat:     Mouth: Mucous membranes are moist.  Eyes:     General:        Right eye: No discharge.        Left eye: No discharge.  Cardiovascular:      Rate and Rhythm: Normal rate and regular rhythm.     Pulses: Normal pulses.     Heart sounds: Normal heart sounds. No murmur heard.   Pulmonary:     Effort: Pulmonary effort is normal. No respiratory distress.     Breath sounds: Normal breath sounds. No wheezing.  Abdominal:     General: Bowel sounds are normal. There is no distension.     Palpations: Abdomen is soft.     Tenderness: There is no abdominal tenderness.  Musculoskeletal:     Cervical back: Normal range of motion.     Right lower leg: No edema.     Left lower leg: No edema.  Lymphadenopathy:     Cervical: No cervical adenopathy.  Skin:    General: Skin is warm and dry.     Capillary Refill: Capillary refill takes less than 2 seconds.     Comments: Venous stasis ulcer to right anterior shin, pea-sized. Periwound intact, no drainage.   Neurological:     General: No focal deficit present.     Mental Status: Mental status is at baseline. She is lethargic.     Motor: Weakness present.     Gait: Gait abnormal.     Comments: Aroused to voice and pain stimuli  Psychiatric:        Mood and Affect: Mood normal. Affect is flat.        Behavior: Behavior normal.        Cognition and Memory: Memory is impaired.     Labs reviewed: Recent Labs    07/06/19 0000  NA 140  140  K 4.1  4.1  CL 105  105  CO2 21  21  BUN 40*  40*  CREATININE 0.9  0.9  CALCIUM 8.5*  8.5*   No results for input(s): AST, ALT, ALKPHOS, BILITOT, PROT, ALBUMIN in the last 8760 hours. Recent Labs  07/06/19 0000  WBC 10.0  10.0  HGB 10.1*  10.1*  HCT 32*  32*  PLT 207  207   Lab Results  Component Value Date   TSH 4.48 05/23/2020   No results found for: HGBA1C Lab Results  Component Value Date   CHOL 159 02/09/2017   HDL 49 02/09/2017   LDLCALC 96 02/09/2017   TRIG 69 02/09/2017   CHOLHDL 3.2 02/09/2017    Significant Diagnostic Results in last 30 days:  CUP PACEART REMOTE DEVICE CHECK  Result Date:  05/23/2020 Scheduled remote reviewed. Normal device function.  15 new AMS events all <1 minute each. No EGMs available for review. Next remote 91 days. HB   Assessment/Plan 1. Weight loss - April weight not collected - ongoing due to advanced dementia and age - continue assisted feedings and nutritional supplementation - cont monthly weights  2. Late onset Alzheimer's disease without behavioral disturbance (HCC) - no behavioral outbursts, sometimes spits food and medications - cont skilled nursing care - consider d/c melatonin if increased sleepiness persists  3. Dysphagia, unspecified type - no recent aspirations reported - cont to sit upright with meals  4. Elevated TSH - recent TSH 4.48  5. Anemia of chronic disease -remains asymptomatic  6. Chronic systolic heart failure (HCC) - lasix reduced to qod last month - no recent weight fluctuations or sob noted    Family/ staff Communication: plan discussed with nurse  Labs/tests ordered: none

## 2020-07-12 DIAGNOSIS — M79672 Pain in left foot: Secondary | ICD-10-CM | POA: Diagnosis not present

## 2020-07-12 DIAGNOSIS — L602 Onychogryphosis: Secondary | ICD-10-CM | POA: Diagnosis not present

## 2020-07-12 DIAGNOSIS — M79671 Pain in right foot: Secondary | ICD-10-CM | POA: Diagnosis not present

## 2020-07-12 DIAGNOSIS — L84 Corns and callosities: Secondary | ICD-10-CM | POA: Diagnosis not present

## 2020-07-18 ENCOUNTER — Encounter: Payer: Self-pay | Admitting: Adult Health

## 2020-07-18 ENCOUNTER — Non-Acute Institutional Stay (SKILLED_NURSING_FACILITY): Payer: Medicare Other | Admitting: Adult Health

## 2020-07-18 DIAGNOSIS — M25512 Pain in left shoulder: Secondary | ICD-10-CM

## 2020-07-18 NOTE — Progress Notes (Signed)
Location:    [facility] Room Number: 111 Place of Service:  SNF ([phone]) Provider:  [name], NP  [name], DO  Patient Care Team: [name], DO as PCP - General (Geriatric Medicine) [name], MD as PCP - Cardiology (Cardiology) [name], NP as Nurse Practitioner (Nurse Practitioner)  Extended Emergency Contact Information Primary Emergency Contact: [name] Address: [address], [address]          [place], [place] [address] [name] Home Phone: [phone] Relation: Other Secondary Emergency Contact: [name] [place] Home Phone: [phone] Relation: Daughter  Code Status:  DNR Goals of care: Advanced Directive information Advanced Directives 07/18/2020  Does Patient Have a Medical Advance Directive? Yes  Type of Advance Directive Out of facility DNR (pink MOST or yellow form);Living will  Does patient want to make changes to medical advance directive? No - Patient declined  Copy of Healthcare Power of Attorney in Chart? -  Pre-existing out of facility DNR order (yellow form or pink MOST form) Pink MOST form placed in chart (order not valid for inpatient use);Yellow form placed in chart (order not valid for inpatient use)     Chief Complaint  Patient presents with  . Acute Visit    Left shoulder pain    HPI:  Pt is a 87 y.o. female seen today for an acute visit for left shoulder pain. Symptoms present for the past few days. Nurses note the shoulder hurts with range of motion and dressing. No acute injury. No bruising, redness, or swelling. Resident can not contribute to the hx due to dementia. She has a hx of left shoulder replacement as well as gout and chronic hip and knee pain. GI upset with nsaids.    Past Medical History:  Diagnosis Date  . CAD (coronary artery disease)   . Chronic low back pain   . Contusion of foot, right 05/15/12  . Dementia (HCC)  10/16/2016   06/25/17 MMSE 18/30 passed clock  . Diverticulitis   . Dyslipidemia   . Dysphagia 12/11/2016  . H/O: hysterectomy   . HTN (hypertension)   . Memory disturbance 05/2012   MMSE 26/30, intact Clock test  . Nonischemic cardiomyopathy (HCC)   . Osteoporosis   . Pulmonary embolism (HCC)   . Right knee pain 05/15/12  . Spinal stenosis   . Ulcer disease   . Weight loss    Past Surgical History:  Procedure Laterality Date  . APPENDECTOMY  2010  . BACK SURGERY    . BIV ICD GENERTAOR CHANGE OUT N/A 05/24/2011   Procedure: BIV ICD GENERTAOR CHANGE OUT;  Surgeon: [name], MD;  Location: [facility] CATH LAB;  Service: Cardiovascular;  Laterality: N/A;  . CATARACT EXTRACTION    . COLON RESECTION  2005  . CORONARY ANGIOPLASTY WITH STENT PLACEMENT  01/2003  . HEMICOLECTOMY    . HIP ARTHROPLASTY Left 04/05/2014   Procedure: ARTHROPLASTY BIPOLAR HIP;  Surgeon: Shelda Pal, MD;  Location: [facility];  Service: Orthopedics;  Laterality: Left;  . left shoulder replacement  1991   Dr. [name]  . ORIF HIP FRACTURE Left 09/30/2016   Procedure: OPEN REDUCTION INTERNAL FIXATION PERIPROSTHETIC HIP FRACTURE;  Surgeon: Ollen Gross, MD;  Location: [facility];  Service: Orthopedics;  Laterality: Left;  . right shoulder repair  1985   Dr. [name]  . rotator cuff debridement  1999   Dr. [name]    Allergies  Allergen  Reactions  . Arthrotec [Diclofenac-Misoprostol] Nausea Only  . Ibuprofen Nausea Only  . Lactose Intolerance (Gi)   . Milk-Related Compounds Nausea And Vomiting    Cheese  . Other     Grass and ragweed   . Oxycontin [Oxycodone Hcl] Nausea Only  . Pollen Extract   . Verelan [Verapamil]     Allergies as of 07/18/2020      Reactions   Arthrotec [diclofenac-misoprostol] Nausea Only   Ibuprofen Nausea Only   Lactose Intolerance (gi)    Milk-related Compounds Nausea And Vomiting   Cheese   Other    Grass and ragweed    Oxycontin [oxycodone Hcl] Nausea Only   Pollen Extract    Verelan  [verapamil]       Medication List       Accurate as of Jul 18, 2020  9:19 AM. If you have any questions, ask your nurse or doctor.        acetaminophen 325 MG tablet Commonly known as: TYLENOL Take 650 mg by mouth 3 (three) times daily.   albuterol 108 (90 Base) MCG/ACT inhaler Commonly known as: VENTOLIN HFA Inhale 2 puffs into the lungs every 6 (six) hours as needed for wheezing or shortness of breath.   allopurinol 100 MG tablet Commonly known as: ZYLOPRIM Take 100 mg by mouth daily.   aspirin EC 81 MG tablet Take 81 mg by mouth daily.   carboxymethylcellul-glycerin 0.5-0.9 % ophthalmic solution Commonly known as: REFRESH OPTIVE Apply 2 drops to eye 2 (two) times daily.   carvedilol 3.125 MG tablet Commonly known as: COREG Take 1.5625 mg by mouth 2 (two) times daily with a meal.   chlorhexidine 0.12 % solution Commonly known as: PERIDEX Use as directed 15 mLs in the mouth or throat 2 (two) times daily.   cyanocobalamin 1000 MCG tablet Take 1,000 mcg by mouth daily.   diclofenac Sodium 1 % Gel Commonly known as: VOLTAREN Apply 4 g topically 4 (four) times daily. Both knees   Docusate Sodium 100 MG capsule Take 100 mg by mouth daily. May crush   feeding supplement (BOOST BREEZE) Liqd Take 1 Container by mouth in the morning and at bedtime.   furosemide 20 MG tablet Commonly known as: LASIX Take 20 mg by mouth every other day.   melatonin 5 MG Tabs Take 5 mg by mouth at bedtime.   nystatin powder Generic drug: nystatin Apply 1 application topically 2 (two) times daily. To left breast fold, if not healed after 7 days notify MD for further orders.   omeprazole 20 MG tablet Commonly known as: PRILOSEC OTC Take 20 mg by mouth 2 (two) times daily.   polyethylene glycol 17 g packet Commonly known as: MIRALAX / GLYCOLAX Take 17 g by mouth every other day. Hold for loose stools   potassium chloride 10 MEQ tablet Commonly known as: KLOR-CON Take 10 mEq by  mouth every other day.   sertraline 25 MG tablet Commonly known as: ZOLOFT Take 25 mg by mouth at bedtime.   traMADol 50 MG tablet Commonly known as: ULTRAM Take 0.5 tablets (25 mg total) by mouth every 8 (eight) hours as needed.   traMADol 50 MG tablet Commonly known as: ULTRAM Take 0.5 tablets (25 mg total) by mouth at bedtime.   Zinc Oxide 13 % Crea Apply topically as needed (after each bathroom use).       Review of Systems  Unable to perform ROS: Dementia    Immunization History  Administered Date(s) Administered  .  Influenza Inj Mdck Quad Pf 01/05/2016  . Influenza, High Dose Seasonal PF 01/01/2019  . Influenza,inj,Quad PF,6+ Mos 01/07/2018  . Influenza-Unspecified 12/22/2014, 01/10/2017, 01/08/2020  . Moderna SARS-COV2 Booster Vaccination 01/28/2020  . Moderna Sars-Covid-2 Vaccination 03/24/2019, 04/21/2019  . Pneumococcal Conjugate-13 12/14/2013  . Pneumococcal Polysaccharide-23 03/29/2016  . Tdap 03/29/2016  . Zoster 11/03/2007  . Zoster Recombinat (Shingrix) 04/01/2017, 06/27/2017   Pertinent  Health Maintenance Due  Topic Date Due  . PNA vac Low Risk Adult  Completed  . DEXA SCAN  Discontinued   Fall Risk  07/02/2019 04/22/2018  Falls in the past year? 0 0  Number falls in past yr: 0 0  Injury with Fall? 0 0  Risk for fall due to : - Impaired mobility;Medication side effect;Mental status change;Impaired balance/gait  Follow up - Falls evaluation completed;Education provided  Comment - seen by therapy for all of this   Functional Status Survey:    Vitals:   07/18/20 0910  BP: 105/62  Pulse: 70  Resp: 16  Temp: (!) 97 F (36.1 C)  SpO2: 98%  Weight: 142 lb 6.4 oz (64.6 kg)  Height: 5\' 5"  (1.651 m)   Body mass index is 23.7 kg/m. Physical Exam Vitals reviewed.  Constitutional:      Appearance: Normal appearance.  Musculoskeletal:        General: Tenderness (left a/c area) present. No swelling, deformity or signs of injury.     Left  shoulder: Tenderness and bony tenderness present. No swelling, deformity, effusion, laceration or crepitus. Decreased range of motion. Normal pulse.     Comments: Pain with ROM of the left shoulder. Pt unable to f/c for further testing. No redness, warmth, or swelling   Neurological:     Mental Status: She is alert.     Labs reviewed: No results for input(s): NA, K, CL, CO2, GLUCOSE, BUN, CREATININE, CALCIUM, MG, PHOS in the last 8760 hours. No results for input(s): AST, ALT, ALKPHOS, BILITOT, PROT, ALBUMIN in the last 8760 hours. No results for input(s): WBC, NEUTROABS, HGB, HCT, MCV, PLT in the last 8760 hours. Lab Results  Component Value Date   TSH 4.48 05/23/2020   No results found for: HGBA1C Lab Results  Component Value Date   CHOL 159 02/09/2017   HDL 49 02/09/2017   LDLCALC 96 02/09/2017   TRIG 69 02/09/2017   CHOLHDL 3.2 02/09/2017    Significant Diagnostic Results in last 30 days:  No results found.  Assessment/Plan  1. Acute pain of left shoulder Increase ultram to 25 mg bid If no improvement could consider prednisone taper Recommend adaptive clothing or button down shirts.  Family/ staff Communication: caregiver and nurse   Labs/tests ordered:  NA

## 2020-07-19 ENCOUNTER — Encounter: Payer: Self-pay | Admitting: Adult Health

## 2020-07-19 MED ORDER — TRAMADOL HCL 50 MG PO TABS: 25.0000 mg | ORAL_TABLET | Freq: Two times a day (BID) | ORAL | 2 refills | Status: DC

## 2020-07-22 DIAGNOSIS — L84 Corns and callosities: Secondary | ICD-10-CM | POA: Diagnosis not present

## 2020-07-22 DIAGNOSIS — M79671 Pain in right foot: Secondary | ICD-10-CM | POA: Diagnosis not present

## 2020-07-22 DIAGNOSIS — L602 Onychogryphosis: Secondary | ICD-10-CM | POA: Diagnosis not present

## 2020-07-22 DIAGNOSIS — M79672 Pain in left foot: Secondary | ICD-10-CM | POA: Diagnosis not present

## 2020-08-01 ENCOUNTER — Encounter: Payer: Self-pay | Admitting: Internal Medicine

## 2020-08-01 ENCOUNTER — Non-Acute Institutional Stay (SKILLED_NURSING_FACILITY): Payer: Medicare Other | Admitting: Internal Medicine

## 2020-08-01 DIAGNOSIS — D638 Anemia in other chronic diseases classified elsewhere: Secondary | ICD-10-CM

## 2020-08-01 DIAGNOSIS — M1A09X0 Idiopathic chronic gout, multiple sites, without tophus (tophi): Secondary | ICD-10-CM

## 2020-08-01 DIAGNOSIS — N1831 Chronic kidney disease, stage 3a: Secondary | ICD-10-CM

## 2020-08-01 DIAGNOSIS — G301 Alzheimer's disease with late onset: Secondary | ICD-10-CM | POA: Diagnosis not present

## 2020-08-01 DIAGNOSIS — Z950 Presence of cardiac pacemaker: Secondary | ICD-10-CM

## 2020-08-01 DIAGNOSIS — R7989 Other specified abnormal findings of blood chemistry: Secondary | ICD-10-CM

## 2020-08-01 DIAGNOSIS — F0280 Dementia in other diseases classified elsewhere without behavioral disturbance: Secondary | ICD-10-CM

## 2020-08-01 NOTE — Progress Notes (Signed)
Location:  Medical illustrator of Service:  SNF (31)  Provider:   Code Status: DNR Goals of Care:  Advanced Directives 07/18/2020  Does Patient Have a Medical Advance Directive? Yes  Type of Advance Directive Out of facility DNR (pink MOST or yellow form);Living will  Does patient want to make changes to medical advance directive? No - Patient declined  Copy of Healthcare Power of Attorney in Chart? -  Pre-existing out of facility DNR order (yellow form or pink MOST form) Pink MOST form placed in chart (order not valid for inpatient use);Yellow form placed in chart (order not valid for inpatient use)     Chief Complaint  Patient presents with   Medical Management of Chronic Issues    HPI: Patient is a 87 y.o. female seen today for medical management of chronic diseases.   Has h/o dementia in dependent for her ADLs  stage IIIa CKD  CAD Chronic systolic CHF, s/p BIV ad PPM H/o Gout and Pseudogout with Arthritis in Multiple Joints Recent weight loss  Patient unable to give me much history she is pleasantly confused. Per nursing she has been doing well there were no new acute issues.  Patient is wheelchair dependent and dependent for her ADLs Recently weight has been stable  Past Medical History:  Diagnosis Date   CAD (coronary artery disease)    Chronic low back pain    Contusion of foot, right 05/15/12   Dementia (HCC) 10/16/2016   06/25/17 MMSE 18/30 passed clock   Diverticulitis    Dyslipidemia    Dysphagia 12/11/2016   H/O: hysterectomy    HTN (hypertension)    Memory disturbance 05/2012   MMSE 26/30, intact Clock test   Nonischemic cardiomyopathy (HCC)    Osteoporosis    Pulmonary embolism (HCC)    Right knee pain 05/15/12   Spinal stenosis    Ulcer disease    Weight loss     Past Surgical History:  Procedure Laterality Date   APPENDECTOMY  2010   BACK SURGERY     BIV ICD GENERTAOR CHANGE OUT N/A 05/24/2011   Procedure: BIV ICD GENERTAOR  CHANGE OUT;  Surgeon: [name], MD;  Location: [facility] CATH LAB;  Service: Cardiovascular;  Laterality: N/A;   CATARACT EXTRACTION     COLON RESECTION  2005   CORONARY ANGIOPLASTY WITH STENT PLACEMENT  01/2003   HEMICOLECTOMY     HIP ARTHROPLASTY Left 04/05/2014   Procedure: ARTHROPLASTY BIPOLAR HIP;  Surgeon: Shelda Pal, MD;  Location: [facility] ORS;  Service: Orthopedics;  Laterality: Left;   left shoulder replacement  1991   Dr. [name]   ORIF HIP FRACTURE Left 09/30/2016   Procedure: OPEN REDUCTION INTERNAL FIXATION PERIPROSTHETIC HIP FRACTURE;  Surgeon: Ollen Gross, MD;  Location: [facility];  Service: Orthopedics;  Laterality: Left;   right shoulder repair  1985   Dr. [name]   rotator cuff debridement  1999   Dr. [name]    Allergies  Allergen Reactions   Arthrotec [Diclofenac-Misoprostol] Nausea Only   Ibuprofen Nausea Only   Lactose Intolerance (Gi)    Milk-Related Compounds Nausea And Vomiting    Cheese   Other     Grass and ragweed    Oxycontin [Oxycodone Hcl] Nausea Only   Pollen Extract    Verelan [Verapamil]     Outpatient Encounter Medications as of 08/01/2020  Medication Sig   acetaminophen (TYLENOL) 325 MG tablet Take 650 mg by mouth in the morning, at noon,  and at bedtime.   albuterol (PROVENTIL HFA;VENTOLIN HFA) 108 (90 Base) MCG/ACT inhaler Inhale 2 puffs into the lungs every 6 (six) hours as needed for wheezing or shortness of breath.   allopurinol (ZYLOPRIM) 100 MG tablet Take 100 mg by mouth daily.   aspirin EC 81 MG tablet Take 81 mg by mouth daily.   carboxymethylcellul-glycerin (REFRESH OPTIVE) 0.5-0.9 % ophthalmic solution Apply 2 drops to eye 2 (two) times daily.   carvedilol (COREG) 3.125 MG tablet Take 1.5625 mg by mouth 2 (two) times daily with a meal.    chlorhexidine (PERIDEX) 0.12 % solution Use as directed 15 mLs in the mouth or throat 2 (two) times daily.   cyanocobalamin 1000 MCG tablet Take 1,000 mcg by mouth daily.    diclofenac Sodium (VOLTAREN) 1 %  GEL Apply 4 g topically 4 (four) times daily. Both knees   Docusate Sodium 100 MG capsule Take 100 mg by mouth daily. May crush   furosemide (LASIX) 20 MG tablet Take 20 mg by mouth every other day.   melatonin 5 MG TABS Take 5 mg by mouth at bedtime.   Nutritional Supplements (FEEDING SUPPLEMENT, BOOST BREEZE,) LIQD Take 1 Container by mouth in the morning and at bedtime.    omeprazole (PRILOSEC OTC) 20 MG tablet Take 20 mg by mouth 2 (two) times daily.   polyethylene glycol (MIRALAX / GLYCOLAX) packet Take 17 g by mouth every other day. Hold for loose stools   potassium chloride (KLOR-CON) 10 MEQ tablet Take 10 mEq by mouth every other day.   sertraline (ZOLOFT) 25 MG tablet Take 25 mg by mouth at bedtime.   traMADol (ULTRAM) 50 MG tablet Take 0.5 tablets (25 mg total) by mouth every 8 (eight) hours as needed.   traMADol (ULTRAM) 50 MG tablet Take 0.5 tablets (25 mg total) by mouth 2 (two) times daily.   Zinc Oxide 13 % CREA Apply topically as needed (after each bathroom use).   No facility-administered encounter medications on file as of 08/01/2020.    Review of Systems:  Review of Systems  Unable to perform ROS: Dementia    Health Maintenance  Topic Date Due   TETANUS/TDAP  10/02/2026   COVID-19 Vaccine  Completed   PNA vac Low Risk Adult  Completed   HPV VACCINES  Aged Out   DEXA SCAN  Discontinued    Physical Exam: Vitals:   08/01/20 1226  BP: 103/64  Pulse: 69  Resp: 17  Temp: 97.6 F (36.4 C)  Weight: 142 lb 6.4 oz (64.6 kg)   Body mass index is 23.7 kg/m. Physical Exam Vitals reviewed.  Constitutional:      Appearance: Normal appearance.  HENT:     Head: Normocephalic.     Nose: Nose normal.     Mouth/Throat:     Mouth: Mucous membranes are moist.     Pharynx: Oropharynx is clear.  Eyes:     Pupils: Pupils are equal, round, and reactive to light.  Cardiovascular:     Rate and Rhythm: Normal rate and regular rhythm.     Heart sounds: Murmur heard.     Pulmonary:     Effort: Pulmonary effort is normal.     Breath sounds: Normal breath sounds.  Abdominal:     General: Abdomen is flat. Bowel sounds are normal.     Palpations: Abdomen is soft.  Musculoskeletal:        General: No swelling.     Cervical back: Neck supple.  Skin:  General: Skin is warm.  Neurological:     General: No focal deficit present.     Mental Status: She is alert.  Psychiatric:        Mood and Affect: Mood normal.        Thought Content: Thought content normal.     Labs reviewed: Basic Metabolic Panel: Recent Labs    05/23/20 0000  TSH 4.48   Liver Function Tests: No results for input(s): AST, ALT, ALKPHOS, BILITOT, PROT, ALBUMIN in the last 8760 hours. No results for input(s): LIPASE, AMYLASE in the last 8760 hours. No results for input(s): AMMONIA in the last 8760 hours. CBC: No results for input(s): WBC, NEUTROABS, HGB, HCT, MCV, PLT in the last 8760 hours. Lipid Panel: No results for input(s): CHOL, HDL, LDLCALC, TRIG, CHOLHDL, LDLDIRECT in the last 8760 hours. No results found for: HGBA1C  Procedures since last visit: No results found.  Assessment/Plan Late onset Alzheimer's disease without behavioral disturbance (HCC) Supportive care Weight mostly stable  Stage 3a chronic kidney disease (HCC) Repeat CMP Anemia of chronic disease Repeat CBC Idiopathic chronic gout of multiple sites without tophus On Allopurinal Elevated TSH Repeat TSH Biventricular cardiac pacemaker in situ Followed by Cardiology Non Ischemic Cardiomyopathy On Low Dose of lasix and Coreg Osteoarthritis Pain controlled with Tramadol    Labs/tests ordered:  CBC,CMP,TSH

## 2020-08-02 DIAGNOSIS — Z79899 Other long term (current) drug therapy: Secondary | ICD-10-CM | POA: Diagnosis not present

## 2020-08-02 LAB — BASIC METABOLIC PANEL
BUN: 30 — AB (ref 4–21)
CO2: 24 — AB (ref 13–22)
Chloride: 104 (ref 99–108)
Creatinine: 0.9 (ref 0.5–1.1)
Glucose: 82
Potassium: 3.9 (ref 3.4–5.3)
Sodium: 142 (ref 137–147)

## 2020-08-02 LAB — HEPATIC FUNCTION PANEL
ALT: 12 (ref 7–35)
AST: 20 (ref 13–35)
Alkaline Phosphatase: 137 — AB (ref 25–125)
Bilirubin, Total: 0.4

## 2020-08-02 LAB — COMPREHENSIVE METABOLIC PANEL
Albumin: 4.2 (ref 3.5–5.0)
Calcium: 9.2 (ref 8.7–10.7)
Globulin: 2.6

## 2020-08-02 LAB — TSH: TSH: 3.84 (ref 0.41–5.90)

## 2020-08-02 LAB — CBC AND DIFFERENTIAL
HCT: 38 (ref 36–46)
Hemoglobin: 12.1 (ref 12.0–16.0)
Platelets: 206 (ref 150–399)
WBC: 8

## 2020-08-02 LAB — CBC: RBC: 3.82 — AB (ref 3.87–5.11)

## 2020-08-18 ENCOUNTER — Ambulatory Visit (INDEPENDENT_AMBULATORY_CARE_PROVIDER_SITE_OTHER): Payer: Medicare Other

## 2020-08-18 DIAGNOSIS — I495 Sick sinus syndrome: Secondary | ICD-10-CM

## 2020-08-20 LAB — CUP PACEART REMOTE DEVICE CHECK
Battery Impedance: 2496 Ohm
Battery Remaining Longevity: 23 mo
Battery Voltage: 2.72 V
Brady Statistic AP VP Percent: 69 %
Brady Statistic AP VS Percent: 0 %
Brady Statistic AS VP Percent: 31 %
Brady Statistic AS VS Percent: 0 %
Date Time Interrogation Session: 20220602194451
Implantable Lead Implant Date: 20071005
Implantable Lead Implant Date: 20071005
Implantable Lead Location: 753858
Implantable Lead Location: 753859
Implantable Lead Model: 4194
Implantable Lead Model: 5076
Implantable Pulse Generator Implant Date: 20130307
Lead Channel Impedance Value: 440 Ohm
Lead Channel Impedance Value: 894 Ohm
Lead Channel Pacing Threshold Amplitude: 0.625 V
Lead Channel Pacing Threshold Amplitude: 0.75 V
Lead Channel Pacing Threshold Pulse Width: 0.4 ms
Lead Channel Pacing Threshold Pulse Width: 0.4 ms
Lead Channel Setting Pacing Amplitude: 2 V
Lead Channel Setting Pacing Amplitude: 2.5 V
Lead Channel Setting Pacing Pulse Width: 0.4 ms
Lead Channel Setting Sensing Sensitivity: 4 mV

## 2020-09-01 ENCOUNTER — Encounter: Payer: Self-pay | Admitting: Internal Medicine

## 2020-09-01 ENCOUNTER — Non-Acute Institutional Stay (SKILLED_NURSING_FACILITY): Payer: Medicare Other | Admitting: Internal Medicine

## 2020-09-01 DIAGNOSIS — W19XXXA Unspecified fall, initial encounter: Secondary | ICD-10-CM | POA: Diagnosis not present

## 2020-09-01 DIAGNOSIS — F0280 Dementia in other diseases classified elsewhere without behavioral disturbance: Secondary | ICD-10-CM

## 2020-09-01 DIAGNOSIS — M79604 Pain in right leg: Secondary | ICD-10-CM | POA: Diagnosis not present

## 2020-09-01 DIAGNOSIS — M79605 Pain in left leg: Secondary | ICD-10-CM | POA: Diagnosis not present

## 2020-09-01 DIAGNOSIS — M25552 Pain in left hip: Secondary | ICD-10-CM | POA: Diagnosis not present

## 2020-09-01 DIAGNOSIS — M25551 Pain in right hip: Secondary | ICD-10-CM | POA: Diagnosis not present

## 2020-09-01 DIAGNOSIS — G301 Alzheimer's disease with late onset: Secondary | ICD-10-CM

## 2020-09-01 DIAGNOSIS — R102 Pelvic and perineal pain: Secondary | ICD-10-CM | POA: Diagnosis not present

## 2020-09-01 NOTE — Progress Notes (Signed)
Location:   [facility] Room Number: 111 Place of Service:  SNF [phone]) Provider:  Einar Crow MD  Mahlon Gammon, MD  Patient Care Team: Mahlon Gammon, MD as PCP - General (Internal Medicine) [name], MD as PCP - Cardiology (Cardiology) [name], NP as Nurse Practitioner (Nurse Practitioner)  Extended Emergency Contact Information Primary Emergency Contact: [name] Address: [address]          [place], [place] [address] [name] Home Phone: [phone] Relation: Other Secondary Emergency Contact: [name] [place] Home Phone: [phone] Relation: Daughter  Code Status:  DNR Goals of care: Advanced Directive information Advanced Directives 07/18/2020  Does Patient Have a Medical Advance Directive? Yes  Type of Advance Directive Out of facility DNR (pink MOST or yellow form);Living will  Does patient want to make changes to medical advance directive? No - Patient declined  Copy of Healthcare Power of Attorney in Chart? -  Pre-existing out of facility DNR order (yellow form or pink MOST form) Pink MOST form placed in chart (order not valid for inpatient use);Yellow form placed in chart (order not valid for inpatient use)     Chief Complaint  Patient presents with   Acute Visit    Pain after fall    HPI:  Pt is a 87 y.o. female seen today for an acute visit for Pain in her both Legs after a fall  Has h/o dementia in dependent for her ADLs  stage IIIa CKD  CAD Chronic systolic CHF, s/p BIV ad PPM H/o Gout and Pseudogout with Arthritis in Multiple Joints Recent weight loss  Unable to give much history due to her dementia Per Nurses and her caregivers she slid through the [place] chair on the floor Since then has been screaming and Crying when they move her leg When I went to her room she was in her bed . No Acute distress As soon as I moved her leg she  screamed   Past Medical History:  Diagnosis Date   CAD (coronary artery disease)    Chronic low back pain    Contusion of foot, right 05/15/12   Dementia (HCC) 10/16/2016   06/25/17 MMSE 18/30 passed clock   Diverticulitis    Dyslipidemia    Dysphagia 12/11/2016   H/O: hysterectomy    HTN (hypertension)    Memory disturbance 05/2012   MMSE 26/30, intact Clock test   Nonischemic cardiomyopathy (HCC)    Osteoporosis    Pulmonary embolism (HCC)    Right knee pain 05/15/12   Spinal stenosis    Ulcer disease    Weight loss    Past Surgical History:  Procedure Laterality Date   APPENDECTOMY  2010   BACK SURGERY     BIV ICD GENERTAOR CHANGE OUT N/A 05/24/2011   Procedure: BIV ICD GENERTAOR CHANGE OUT;  Surgeon: [name], MD;  Location: [facility] CATH LAB;  Service: Cardiovascular;  Laterality: N/A;   CATARACT EXTRACTION     COLON RESECTION  2005   CORONARY ANGIOPLASTY WITH STENT PLACEMENT  01/2003   HEMICOLECTOMY     HIP ARTHROPLASTY Left 04/05/2014   Procedure: ARTHROPLASTY BIPOLAR HIP;  Surgeon: Shelda Pal, MD;  Location: [facility];  Service: Orthopedics;  Laterality: Left;   left shoulder replacement  1991   Dr. [name]   ORIF HIP FRACTURE Left 09/30/2016   Procedure: OPEN REDUCTION INTERNAL FIXATION PERIPROSTHETIC HIP FRACTURE;  Surgeon: Ollen Gross,  MD;  Location: [facility];  Service: Orthopedics;  Laterality: Left;   right shoulder repair  1985   Dr. [name]   rotator cuff debridement  1999   Dr. [name]    Allergies  Allergen Reactions   Arthrotec [Diclofenac-Misoprostol] Nausea Only   Ibuprofen Nausea Only   Lactose Intolerance (Gi)    Milk-Related Compounds Nausea And Vomiting    Cheese   Other     Grass and ragweed    Oxycontin [Oxycodone Hcl] Nausea Only   Pollen Extract    Verelan [Verapamil]     Allergies as of 09/01/2020       Reactions   Arthrotec [diclofenac-misoprostol] Nausea Only   Ibuprofen Nausea Only   Lactose Intolerance (gi)    Milk-related Compounds  Nausea And Vomiting   Cheese   Other    Grass and ragweed    Oxycontin [oxycodone Hcl] Nausea Only   Pollen Extract    Verelan [verapamil]         Medication List        Accurate as of September 01, 2020  4:10 PM. If you have any questions, ask your nurse or doctor.          acetaminophen 325 MG tablet Commonly known as: TYLENOL Take 650 mg by mouth in the morning, at noon, and at bedtime.   albuterol 108 (90 Base) MCG/ACT inhaler Commonly known as: VENTOLIN HFA Inhale 2 puffs into the lungs every 6 (six) hours as needed for wheezing or shortness of breath.   allopurinol 100 MG tablet Commonly known as: ZYLOPRIM Take 100 mg by mouth daily.   aspirin EC 81 MG tablet Take 81 mg by mouth daily.   carboxymethylcellul-glycerin 0.5-0.9 % ophthalmic solution Commonly known as: REFRESH OPTIVE Apply 2 drops to eye 2 (two) times daily.   carvedilol 3.125 MG tablet Commonly known as: COREG Take 1.5625 mg by mouth 2 (two) times daily with a meal.   chlorhexidine 0.12 % solution Commonly known as: PERIDEX Use as directed 15 mLs in the mouth or throat 2 (two) times daily.   cyanocobalamin 1000 MCG tablet Take 1,000 mcg by mouth daily.   diclofenac Sodium 1 % Gel Commonly known as: VOLTAREN Apply 4 g topically 4 (four) times daily. Both knees   Docusate Sodium 100 MG capsule Take 100 mg by mouth daily. May crush   feeding supplement (BOOST BREEZE) Liqd Take 1 Container by mouth in the morning and at bedtime.   furosemide 20 MG tablet Commonly known as: LASIX Take 20 mg by mouth every other day.   melatonin 5 MG Tabs Take 5 mg by mouth at bedtime.   omeprazole 20 MG tablet Commonly known as: PRILOSEC OTC Take 20 mg by mouth 2 (two) times daily.   polyethylene glycol 17 g packet Commonly known as: MIRALAX / GLYCOLAX Take 17 g by mouth every other day. Hold for loose stools   potassium chloride 10 MEQ tablet Commonly known as: KLOR-CON Take 10 mEq by mouth every  other day.   sertraline 25 MG tablet Commonly known as: ZOLOFT Take 25 mg by mouth at bedtime.   traMADol 50 MG tablet Commonly known as: ULTRAM Take 0.5 tablets (25 mg total) by mouth every 8 (eight) hours as needed.   traMADol 50 MG tablet Commonly known as: ULTRAM Take 0.5 tablets (25 mg total) by mouth 2 (two) times daily.   Zinc Oxide 13 % Crea Apply topically as needed (after each bathroom use).  Review of Systems  Unable to perform ROS: Dementia   Immunization History  Administered Date(s) Administered   Influenza Inj Mdck Quad Pf 01/05/2016   Influenza, High Dose Seasonal PF 01/01/2019   Influenza,inj,Quad PF,6+ Mos 01/07/2018   Influenza-Unspecified 12/22/2014, 01/10/2017, 01/08/2020   Moderna SARS-COV2 Booster Vaccination 01/28/2020   Moderna Sars-Covid-2 Vaccination 03/24/2019, 04/21/2019   Pneumococcal Conjugate-13 12/14/2013   Pneumococcal Polysaccharide-23 03/29/2016   Tdap 03/29/2016   Zoster Recombinat (Shingrix) 04/01/2017, 06/27/2017   Zoster, Live 11/03/2007   Pertinent  Health Maintenance Due  Topic Date Due   PNA vac Low Risk Adult  Completed   DEXA SCAN  Discontinued   Fall Risk  07/02/2019 04/22/2018  Falls in the past year? 0 0  Number falls in past yr: 0 0  Injury with Fall? 0 0  Risk for fall due to : - Impaired mobility;Medication side effect;Mental status change;Impaired balance/gait  Follow up - Falls evaluation completed;Education provided  Comment - seen by therapy for all of this   Functional Status Survey:    Vitals:   09/01/20 1553  BP: 137/88  Pulse: 65  Resp: 16  Temp: (!) 97.5 F (36.4 C)  SpO2: 96%  Weight: 139 lb 9.6 oz (63.3 kg)  Height: 5\' 5"  (1.651 m)   Body mass index is 23.23 kg/m. Physical Exam Constitutional: Well-developed and well-nourished.  HENT:  Head: Normocephalic.  Mouth/Throat: Oropharynx is clear and moist.  Eyes: Pupils are equal, round, and reactive to light.  Neck: Neck supple.   Cardiovascular: Normal rate and normal heart sounds.   murmur heard. Pulmonary/Chest: Effort normal and breath sounds normal. No respiratory distress. No wheezes. She has no rales.  Abdominal: Soft. Bowel sounds are normal. No distension. There is no tenderness. There is no rebound.  Musculoskeletal: No edema. When I moved her Leg after distraction by Nurses she did not seem to be in pain or restricting her motion but then will scream again Lymphadenopathy: none Neurological: Alert and oriented to person, place, and time.  Skin: Skin is warm and dry.  Psychiatric: Normal mood and affect. Behavior is normal. Thought content normal.    Labs reviewed: Recent Labs    08/02/20 0000  NA 142  K 3.9  CL 104  CO2 24*  BUN 30*  CREATININE 0.9  CALCIUM 9.2   Recent Labs    08/02/20 0000  AST 20  ALT 12  ALKPHOS 137*  ALBUMIN 4.2   Recent Labs    08/02/20 0000  WBC 8.0  HGB 12.1  HCT 38  PLT 206   Lab Results  Component Value Date   TSH 3.84 08/02/2020   No results found for: HGBA1C Lab Results  Component Value Date   CHOL 159 02/09/2017   HDL 49 02/09/2017   LDLCALC 96 02/09/2017   TRIG 69 02/09/2017   CHOLHDL 3.2 02/09/2017    Significant Diagnostic Results in last 30 days:  CUP PACEART REMOTE DEVICE CHECK  Result Date: 08/20/2020 Scheduled remote reviewed. Normal device function.  Next remote 91 days. 10/20/2020, RN, CCDS, [employer] Scheduled remote reviewed. Normal device function.  Next remote 91 days. Hassell Halim, RN, CCDS, [employer]   Assessment/Plan Fall, initial encounter  Pain in both lower extremities Xrays of Both Hip and Pelvis stat  Knee joint looks intact  Late onset Alzheimer's disease without behavioral disturbance (HCC) Very Hard to get detail history Has lost some more weight   Other issues Stage 3a chronic  kidney disease (HCC) Creat Normal Anemia of chronic disease HGB normal Idiopathic chronic gout of  multiple sites without tophus On Allopurinal Elevated TSH TSH normal Biventricular cardiac pacemaker in situ Followed by Cardiology Non Ischemic Cardiomyopathy On Low Dose of lasix and Coreg Osteoarthritis Pain controlled with Tramadol  Family/ staff Communication:   Labs/tests ordered:

## 2020-09-05 ENCOUNTER — Telehealth: Payer: Self-pay | Admitting: Adult Health

## 2020-09-05 NOTE — Telephone Encounter (Signed)
Nurse wrote an SBAR for leg pain bilaterally with leg sensitivity, neuropathic like pain. Pt has had this issue for quite some time. She was just evaluated for leg pain and a fall. F/U xrays were negative for acute fracture. Will add neurontin to her regimen for pain control

## 2020-09-06 ENCOUNTER — Encounter: Payer: Self-pay | Admitting: Internal Medicine

## 2020-09-06 ENCOUNTER — Non-Acute Institutional Stay (SKILLED_NURSING_FACILITY): Payer: Medicare Other | Admitting: Orthopedic Surgery

## 2020-09-06 ENCOUNTER — Encounter: Payer: Self-pay | Admitting: Orthopedic Surgery

## 2020-09-06 DIAGNOSIS — N1831 Chronic kidney disease, stage 3a: Secondary | ICD-10-CM

## 2020-09-06 DIAGNOSIS — M79604 Pain in right leg: Secondary | ICD-10-CM | POA: Diagnosis not present

## 2020-09-06 DIAGNOSIS — I5022 Chronic systolic (congestive) heart failure: Secondary | ICD-10-CM

## 2020-09-06 DIAGNOSIS — D638 Anemia in other chronic diseases classified elsewhere: Secondary | ICD-10-CM

## 2020-09-06 DIAGNOSIS — M25572 Pain in left ankle and joints of left foot: Secondary | ICD-10-CM | POA: Diagnosis not present

## 2020-09-06 DIAGNOSIS — G301 Alzheimer's disease with late onset: Secondary | ICD-10-CM

## 2020-09-06 DIAGNOSIS — Z950 Presence of cardiac pacemaker: Secondary | ICD-10-CM

## 2020-09-06 DIAGNOSIS — F0280 Dementia in other diseases classified elsewhere without behavioral disturbance: Secondary | ICD-10-CM

## 2020-09-06 DIAGNOSIS — M79605 Pain in left leg: Secondary | ICD-10-CM

## 2020-09-06 DIAGNOSIS — M1A09X0 Idiopathic chronic gout, multiple sites, without tophus (tophi): Secondary | ICD-10-CM

## 2020-09-06 DIAGNOSIS — M25571 Pain in right ankle and joints of right foot: Secondary | ICD-10-CM | POA: Diagnosis not present

## 2020-09-06 DIAGNOSIS — R400 Somnolence: Secondary | ICD-10-CM | POA: Diagnosis not present

## 2020-09-06 LAB — HEPATIC FUNCTION PANEL
ALT: 7 (ref 7–35)
AST: 12 — AB (ref 13–35)
Alkaline Phosphatase: 96 (ref 25–125)
Bilirubin, Total: 0.7

## 2020-09-06 LAB — CBC AND DIFFERENTIAL
HCT: 40 (ref 36–46)
Hemoglobin: 13.2 (ref 12.0–16.0)
Neutrophils Absolute: 3.9
Platelets: 144 — AB (ref 150–399)
WBC: 6.3

## 2020-09-06 LAB — BASIC METABOLIC PANEL
BUN: 41 — AB (ref 4–21)
CO2: 26 — AB (ref 13–22)
Chloride: 110 — AB (ref 99–108)
Creatinine: 1.2 — AB (ref 0.5–1.1)
Glucose: 92
Potassium: 4.4 (ref 3.4–5.3)
Sodium: 145 (ref 137–147)

## 2020-09-06 LAB — CBC: RBC: 4.2 (ref 3.87–5.11)

## 2020-09-06 LAB — COMPREHENSIVE METABOLIC PANEL
Albumin: 3.5 (ref 3.5–5.0)
Calcium: 8.4 — AB (ref 8.7–10.7)

## 2020-09-06 NOTE — Progress Notes (Signed)
Location:  [facility] Room Number: 111 Place of Service:  SNF [phone]) Provider:  Hazle Nordmann, AGNP-C  Mahlon Gammon, MD  Patient Care Team: Mahlon Gammon, MD as PCP - General (Internal Medicine) [name], MD as PCP - Cardiology (Cardiology) [name], NP as Nurse Practitioner (Nurse Practitioner)  Extended Emergency Contact Information Primary Emergency Contact: [name] Address: [address]          [place], [place] [address] [name] Home Phone: [phone] Relation: Other Secondary Emergency Contact: [name] [place] Home Phone: [phone] Relation: Daughter  Code Status:  DNR Goals of care: Advanced Directive information Advanced Directives 07/18/2020  Does Patient Have a Medical Advance Directive? Yes  Type of Advance Directive Out of facility DNR (pink MOST or yellow form);Living will  Does patient want to make changes to medical advance directive? No - Patient declined  Copy of Healthcare Power of Attorney in Chart? -  Pre-existing out of facility DNR order (yellow form or pink MOST form) Pink MOST form placed in chart (order not valid for inpatient use);Yellow form placed in chart (order not valid for inpatient use)     Chief Complaint  Patient presents with   Medical Management of Chronic Issues    HPI:  Pt is a 87 y.o. female seen today for medical management of chronic diseases.    She currently resides on the skilled unit at [facility] due to dementia. Past medical history includes: CAD, heart failure, LBBB, cardiomyopathy, sick sinus syndrome, dysphagia, OA, CKD stage 3, anemia of chronic disease, constipation, gout, and depression.   06/16 she fell while receiving a bath. CMA reports her shower chair gave out and she fell on her bottom. She complained of bilateral leg pain after incident, x-ray pelvis and right hip ordered, negative for acute fracture.  Gabapentin 200 mg po bid and tramadol 25 mg po bid ordered for pain. Since starting new medications she has been more lethargic. Today, she will open her eyes to noise and touch, but fall back asleep. She is nonverbal during our encounter. CMA reports she was awake to eat a few bites of breakfast this morning and take medication. Daughter requesting xray of ankles.   Dementia- no behavioral outbursts, dependent with ADLs.  CAD- aspirin 81 mg daily Chronic systolic CHF- s/p BIV and PPM, no recent weight fluctuations, ankle edema or sob, Lasix 20 meq QOD.  Non ischemic cardiomyopathy-  no issues, coreg 3.125 mg po bid.  Gout- no recent flares, allopurinol 100 mg daily CKD stage 3- BUN/creat 30/0.9 08/02/2020 Dysphagia- no recent aspirations, remains on pureed diet/ thin liquids/ chopped sides.  Anemia- Hgb 12.1 08/02/2020  Recent blood pressures:  06/21-99/63  06/16- 137/88  06/07- 146/83  Recent weights:  06/01- 139.6 lbs  04/02- 142.4 lbs  03/01- 146 lbs  Eating 25-50% of most meals.   Nurse does nor report any other concerns, vitals stable.    Past Medical History:  Diagnosis Date   CAD (coronary artery disease)    Chronic low back pain    Contusion of foot, right 05/15/12   Dementia (HCC) 10/16/2016   06/25/17 MMSE 18/30 passed clock   Diverticulitis    Dyslipidemia    Dysphagia 12/11/2016   H/O: hysterectomy    HTN (hypertension)    Memory disturbance 05/2012   MMSE 26/30, intact Clock test   Nonischemic cardiomyopathy (HCC)    Osteoporosis    Pulmonary embolism (  HCC)    Right knee pain 05/15/12   Spinal stenosis    Ulcer disease    Weight loss    Past Surgical History:  Procedure Laterality Date   APPENDECTOMY  2010   BACK SURGERY     BIV ICD GENERTAOR CHANGE OUT N/A 05/24/2011   Procedure: BIV ICD GENERTAOR CHANGE OUT;  Surgeon: [name], MD;  Location: [facility] CATH LAB;  Service: Cardiovascular;  Laterality: N/A;   CATARACT EXTRACTION     COLON RESECTION  2005    CORONARY ANGIOPLASTY WITH STENT PLACEMENT  01/2003   HEMICOLECTOMY     HIP ARTHROPLASTY Left 04/05/2014   Procedure: ARTHROPLASTY BIPOLAR HIP;  Surgeon: Shelda Pal, MD;  Location: [facility] ORS;  Service: Orthopedics;  Laterality: Left;   left shoulder replacement  1991   Dr. [name]   ORIF HIP FRACTURE Left 09/30/2016   Procedure: OPEN REDUCTION INTERNAL FIXATION PERIPROSTHETIC HIP FRACTURE;  Surgeon: Ollen Gross, MD;  Location: [facility] ORS;  Service: Orthopedics;  Laterality: Left;   right shoulder repair  1985   Dr. [name]   rotator cuff debridement  1999   Dr. [name]    Allergies  Allergen Reactions   Arthrotec [Diclofenac-Misoprostol] Nausea Only   Ibuprofen Nausea Only   Lactose Intolerance (Gi)    Milk-Related Compounds Nausea And Vomiting    Cheese   Other     Grass and ragweed    Oxycontin [Oxycodone Hcl] Nausea Only   Pollen Extract    Verelan [Verapamil]     Outpatient Encounter Medications as of 09/06/2020  Medication Sig   acetaminophen (TYLENOL) 325 MG tablet Take 650 mg by mouth in the morning, at noon, and at bedtime.   albuterol (PROVENTIL HFA;VENTOLIN HFA) 108 (90 Base) MCG/ACT inhaler Inhale 2 puffs into the lungs every 6 (six) hours as needed for wheezing or shortness of breath.   allopurinol (ZYLOPRIM) 100 MG tablet Take 100 mg by mouth daily.   aspirin EC 81 MG tablet Take 81 mg by mouth daily.   carboxymethylcellul-glycerin (REFRESH OPTIVE) 0.5-0.9 % ophthalmic solution Apply 2 drops to eye 2 (two) times daily.   carvedilol (COREG) 3.125 MG tablet Take 1.5625 mg by mouth 2 (two) times daily with a meal.    chlorhexidine (PERIDEX) 0.12 % solution Use as directed 15 mLs in the mouth or throat 2 (two) times daily.   cyanocobalamin 1000 MCG tablet Take 1,000 mcg by mouth daily.    diclofenac Sodium (VOLTAREN) 1 % GEL Apply 4 g topically 4 (four) times daily. Both knees   Docusate Sodium 100 MG capsule Take 100 mg by mouth daily. May crush   furosemide (LASIX) 20 MG tablet  Take 20 mg by mouth every other day.   gabapentin (NEURONTIN) 100 MG capsule Take 100 mg by mouth 2 (two) times daily.   melatonin 5 MG TABS Take 5 mg by mouth at bedtime.   Nutritional Supplements (FEEDING SUPPLEMENT, BOOST BREEZE,) LIQD Take 1 Container by mouth in the morning and at bedtime.    omeprazole (PRILOSEC OTC) 20 MG tablet Take 20 mg by mouth 2 (two) times daily.   polyethylene glycol (MIRALAX / GLYCOLAX) packet Take 17 g by mouth every other day. Hold for loose stools   potassium chloride (KLOR-CON) 10 MEQ tablet Take 10 mEq by mouth every other day.   sertraline (ZOLOFT) 25 MG tablet Take 25 mg by mouth at bedtime.   traMADol (ULTRAM) 50 MG tablet Take 0.5 tablets (25 mg total) by mouth  every 8 (eight) hours as needed.   traMADol (ULTRAM) 50 MG tablet Take 0.5 tablets (25 mg total) by mouth 2 (two) times daily.   Zinc Oxide 13 % CREA Apply topically as needed (after each bathroom use).   No facility-administered encounter medications on file as of 09/06/2020.    Review of Systems  Unable to perform ROS: Dementia   Immunization History  Administered Date(s) Administered   Influenza Inj Mdck Quad Pf 01/05/2016   Influenza, High Dose Seasonal PF 01/01/2019   Influenza,inj,Quad PF,6+ Mos 01/07/2018   Influenza-Unspecified 12/22/2014, 01/10/2017, 01/08/2020   Moderna SARS-COV2 Booster Vaccination 01/28/2020   Moderna Sars-Covid-2 Vaccination 03/24/2019, 04/21/2019   Pneumococcal Conjugate-13 12/14/2013   Pneumococcal Polysaccharide-23 03/29/2016   Tdap 03/29/2016   Zoster Recombinat (Shingrix) 04/01/2017, 06/27/2017   Zoster, Live 11/03/2007   Pertinent  Health Maintenance Due  Topic Date Due   PNA vac Low Risk Adult  Completed   DEXA SCAN  Discontinued   Fall Risk  07/02/2019 04/22/2018  Falls in the past year? 0 0  Number falls in past yr: 0 0  Injury with Fall? 0 0  Risk for fall due to : - Impaired mobility;Medication side effect;Mental status change;Impaired  balance/gait  Follow up - Falls evaluation completed;Education provided  Comment - seen by therapy for all of this   Functional Status Survey:    Vitals:   09/06/20 1448  BP: 99/63  Pulse: 70  Resp: 15  Temp: (!) 96.2 F (35.7 C)  SpO2: 94%  Weight: 139 lb 9.6 oz (63.3 kg)  Height: 5\' 5"  (1.651 m)   Body mass index is 23.23 kg/m. Physical Exam Vitals reviewed.  Constitutional:      General: She is not in acute distress. HENT:     Head: Normocephalic.     Right Ear: There is no impacted cerumen.     Left Ear: There is no impacted cerumen.     Nose: Nose normal.  Eyes:     General:        Right eye: No discharge.        Left eye: No discharge.  Neck:     Thyroid: No thyroid mass or thyromegaly.     Vascular: No carotid bruit.  Cardiovascular:     Rate and Rhythm: Normal rate and regular rhythm.     Pulses: Normal pulses.     Heart sounds: Normal heart sounds. No murmur heard. Pulmonary:     Effort: Pulmonary effort is normal. No respiratory distress.     Breath sounds: Normal breath sounds. No wheezing.  Abdominal:     General: Bowel sounds are normal. There is no distension.     Palpations: Abdomen is soft.     Tenderness: There is no abdominal tenderness.  Musculoskeletal:     Cervical back: Normal range of motion.     Right lower leg: No edema.     Left lower leg: No edema.  Lymphadenopathy:     Cervical: No cervical adenopathy.  Skin:    General: Skin is warm and dry.     Capillary Refill: Capillary refill takes less than 2 seconds.     Findings: Bruising present.     Comments: Yellow/green bruising to bilateral shins, no skin breakdown.   Neurological:     General: No focal deficit present.     Mental Status: She is alert and easily aroused. Mental status is at baseline.     Motor: Weakness present.     Gait:  Gait abnormal.     Comments: Wheelchair/lift  Psychiatric:        Attention and Perception: She is inattentive.        Cognition and Memory:  Memory is impaired.    Labs reviewed: Recent Labs    08/02/20 0000  NA 142  K 3.9  CL 104  CO2 24*  BUN 30*  CREATININE 0.9  CALCIUM 9.2   Recent Labs    08/02/20 0000  AST 20  ALT 12  ALKPHOS 137*  ALBUMIN 4.2   Recent Labs    08/02/20 0000  WBC 8.0  HGB 12.1  HCT 38  PLT 206   Lab Results  Component Value Date   TSH 3.84 08/02/2020   No results found for: HGBA1C Lab Results  Component Value Date   CHOL 159 02/09/2017   HDL 49 02/09/2017   LDLCALC 96 02/09/2017   TRIG 69 02/09/2017   CHOLHDL 3.2 02/09/2017    Significant Diagnostic Results in last 30 days:  CUP PACEART REMOTE DEVICE CHECK  Result Date: 08/20/2020 Scheduled remote reviewed. Normal device function.  Next remote 91 days. Hassell Halim, RN, CCDS, [employer] Scheduled remote reviewed. Normal device function.  Next remote 91 days. Hassell Halim, RN, CCDS, [employer]   Assessment/Plan 1. Somnolence - aroused to pain and voice stimuli - suspect due to gabapentin and tramadol - discontinue gabapentin - discontinue tramadol 25 mg po bid - discontinue melatonin - cbc/diff- stat - cmp- stat  2. Pain in both lower extremities - fall 06/16, shower chair gave out and fell on bottom - xray pelvis and right hip negative for acute fracture - some yellow/green bruising to bilateral ankles, no skin breakdown or swelling - xray bilateral ankles  3. Late onset Alzheimer's disease without behavioral disturbance (HCC) - dependent with ADLs - no recent behavioral outbursts - cont skilled nursing care  4. Stage 3a chronic kidney disease (HCC) -BUN/creat 30/0.9 08/02/2020 - cont to avoid nephrotoxic drugs like NSAIDS and dose adjust medications to be renally excreted - encourage hydration  5. Anemia of chronic disease -Hgb 12.1 08/02/2020  6. Idiopathic chronic gout of multiple sites without tophus - no recent flares - cont allopurinol  7. Chronic systolic heart failure  (HCC) - no recent weight fluctuations, sob or ankle edema - cont lasix and coreg  8. Biventricular cardiac pacemaker in situ - followed by cardiology    Family/ staff Communication: plan discussed with nurse and daughter  Labs/tests ordered:  cbc/diff, cmp, xray bilateral ankles

## 2020-09-07 ENCOUNTER — Telehealth: Payer: Self-pay | Admitting: Internal Medicine

## 2020-09-07 NOTE — Telephone Encounter (Signed)
Patient had Xrays of Both Ankles done yesterday for Pain and swelling. She has Bilateral Distal Tibia Fracture. Patient is Non Ambulatory and is Michiel Sites Dependent for her Transfers Nursing supervisor d/w her son/POA  They want her to be comfortable NO ED transfer Soft Cast Bilateral Per Therapy Pain Control with Tramadol [facility] consult

## 2020-09-09 NOTE — Progress Notes (Signed)
Remote pacemaker transmission.   

## 2020-09-22 ENCOUNTER — Non-Acute Institutional Stay (SKILLED_NURSING_FACILITY): Payer: Medicare Other | Admitting: Adult Health

## 2020-09-22 ENCOUNTER — Encounter: Payer: Self-pay | Admitting: Adult Health

## 2020-09-22 DIAGNOSIS — G301 Alzheimer's disease with late onset: Secondary | ICD-10-CM | POA: Diagnosis not present

## 2020-09-22 DIAGNOSIS — S82891A Other fracture of right lower leg, initial encounter for closed fracture: Secondary | ICD-10-CM | POA: Diagnosis not present

## 2020-09-22 DIAGNOSIS — R634 Abnormal weight loss: Secondary | ICD-10-CM

## 2020-09-22 DIAGNOSIS — R1312 Dysphagia, oropharyngeal phase: Secondary | ICD-10-CM

## 2020-09-22 DIAGNOSIS — I5022 Chronic systolic (congestive) heart failure: Secondary | ICD-10-CM

## 2020-09-22 DIAGNOSIS — F0280 Dementia in other diseases classified elsewhere without behavioral disturbance: Secondary | ICD-10-CM

## 2020-09-22 DIAGNOSIS — S82892A Other fracture of left lower leg, initial encounter for closed fracture: Secondary | ICD-10-CM

## 2020-09-22 NOTE — Progress Notes (Signed)
Location:  Medical illustrator of Service:  SNF (31) Provider:   [name], ANP [facility] [phone]   Mahlon Gammon, MD  Patient Care Team: Mahlon Gammon, MD as PCP - General (Internal Medicine) [name], MD as PCP - Cardiology (Cardiology) [name], NP as Nurse Practitioner (Nurse Practitioner)  Extended Emergency Contact Information Primary Emergency Contact: [name] Address: [address], [address]          [place], [place] [address] [name] Home Phone: [phone] Relation: Other Secondary Emergency Contact: [name] [place] Home Phone: [phone] Relation: Daughter  Code Status:  DNR Goals of care: Advanced Directive information Advanced Directives 07/18/2020  Does Patient Have a Medical Advance Directive? Yes  Type of Advance Directive Out of facility DNR (pink MOST or yellow form);Living will  Does patient want to make changes to medical advance directive? No - Patient declined  Copy of Healthcare Power of Attorney in Chart? -  Pre-existing out of facility DNR order (yellow form or pink MOST form) Pink MOST form placed in chart (order not valid for inpatient use);Yellow form placed in chart (order not valid for inpatient use)     Chief Complaint  Patient presents with   Acute Visit    Weight loss     HPI:  Pt is a 87 y.o. female seen today for an acute visit for weight loss.   [name] has a hx of AD, CHF, pacemaker due to SSS, dysphagia, weight loss, hip and knee pain, CKD, CAD, anemia of chronic disease, gout, hiatal hernia, depression, and constipation   Nurse reports weight loss and low appetite.   Wt Readings from Last 3 Encounters:  09/22/20 131 lb (59.4 kg)  09/06/20 139 lb 9.6 oz (63.3 kg)  09/01/20 139 lb 9.6 oz (63.3 kg)  Has lost 11 lbs in the past 3 months Now on D1 diet, eating less, spitting food out at times  Continues lasix qod  with weight loss. No edema or sob   Had a mechanical fall in the shower and suffered fractures to both ankles. In minimal pain per nursing staff with ultram and tylenol. She has worked with therapy and now has supportive boots on both ankles. No surgical or ortho intervention due to her goals of care and overall declining health.   09/06/20: Right and left ankle xrays : Non displaced fractures of the distal diaphysis of the tibia and fibula  Past Medical History:  Diagnosis Date   CAD (coronary artery disease)    Chronic low back pain    Contusion of foot, right 05/15/12   Dementia (HCC) 10/16/2016   06/25/17 MMSE 18/30 passed clock   Diverticulitis    Dyslipidemia    Dysphagia 12/11/2016   H/O: hysterectomy    HTN (hypertension)    Memory disturbance 05/2012   MMSE 26/30, intact Clock test   Nonischemic cardiomyopathy (HCC)    Osteoporosis    Pulmonary embolism (HCC)    Right knee pain 05/15/12   Spinal stenosis    Ulcer disease    Weight loss    Past Surgical History:  Procedure Laterality Date   APPENDECTOMY  2010   BACK SURGERY     BIV ICD GENERTAOR CHANGE OUT N/A 05/24/2011   Procedure: BIV ICD GENERTAOR CHANGE OUT;  Surgeon: [name], MD;  Location: [facility] CATH LAB;  Service: Cardiovascular;  Laterality: N/A;   CATARACT EXTRACTION  COLON RESECTION  2005   CORONARY ANGIOPLASTY WITH STENT PLACEMENT  01/2003   HEMICOLECTOMY     HIP ARTHROPLASTY Left 04/05/2014   Procedure: ARTHROPLASTY BIPOLAR HIP;  Surgeon: Shelda Pal, MD;  Location: [facility] ORS;  Service: Orthopedics;  Laterality: Left;   left shoulder replacement  1991   Dr. [name]   ORIF HIP FRACTURE Left 09/30/2016   Procedure: OPEN REDUCTION INTERNAL FIXATION PERIPROSTHETIC HIP FRACTURE;  Surgeon: Ollen Gross, MD;  Location: [facility] ORS;  Service: Orthopedics;  Laterality: Left;   right shoulder repair  1985   Dr. [name]   rotator cuff debridement  1999   Dr. [name]    Allergies  Allergen Reactions   Arthrotec  [Diclofenac-Misoprostol] Nausea Only   Ibuprofen Nausea Only   Lactose Intolerance (Gi)    Milk-Related Compounds Nausea And Vomiting    Cheese   Other     Grass and ragweed    Oxycontin [Oxycodone Hcl] Nausea Only   Pollen Extract    Verelan [Verapamil]     Outpatient Encounter Medications as of 09/22/2020  Medication Sig   acetaminophen (TYLENOL) 325 MG tablet Take 650 mg by mouth in the morning, at noon, and at bedtime.   albuterol (PROVENTIL HFA;VENTOLIN HFA) 108 (90 Base) MCG/ACT inhaler Inhale 2 puffs into the lungs every 6 (six) hours as needed for wheezing or shortness of breath.   allopurinol (ZYLOPRIM) 100 MG tablet Take 100 mg by mouth daily.   aspirin EC 81 MG tablet Take 81 mg by mouth daily.   carboxymethylcellul-glycerin (REFRESH OPTIVE) 0.5-0.9 % ophthalmic solution Apply 2 drops to eye 2 (two) times daily.   carvedilol (COREG) 3.125 MG tablet Take 1.5625 mg by mouth 2 (two) times daily with a meal.    chlorhexidine (PERIDEX) 0.12 % solution Use as directed 15 mLs in the mouth or throat 2 (two) times daily.   cyanocobalamin 1000 MCG tablet Take 1,000 mcg by mouth daily.    diclofenac Sodium (VOLTAREN) 1 % GEL Apply 4 g topically 4 (four) times daily. Both knees   Docusate Sodium 100 MG capsule Take 100 mg by mouth daily. May crush   Nutritional Supplements (FEEDING SUPPLEMENT, BOOST BREEZE,) LIQD Take 1 Container by mouth in the morning and at bedtime.    omeprazole (PRILOSEC OTC) 20 MG tablet Take 20 mg by mouth 2 (two) times daily.   polyethylene glycol (MIRALAX / GLYCOLAX) packet Take 17 g by mouth every other day. Hold for loose stools   sertraline (ZOLOFT) 25 MG tablet Take 25 mg by mouth at bedtime.   traMADol (ULTRAM) 50 MG tablet Take 0.5 tablets (25 mg total) by mouth every 8 (eight) hours as needed.   Zinc Oxide 13 % CREA Apply topically as needed (after each bathroom use).   [DISCONTINUED] furosemide (LASIX) 20 MG tablet Take 20 mg by mouth every other day.    [DISCONTINUED] potassium chloride (KLOR-CON) 10 MEQ tablet Take 10 mEq by mouth every other day.   No facility-administered encounter medications on file as of 09/22/2020.    Review of Systems  Constitutional:  Positive for activity change (in supportive chair, less mobile) and unexpected weight change. Negative for appetite change, chills, diaphoresis, fatigue and fever.  HENT:  Positive for trouble swallowing. Negative for congestion.   Respiratory:  Negative for cough, shortness of breath and wheezing.   Cardiovascular:  Negative for chest pain, palpitations and leg swelling.  Gastrointestinal:  Negative for abdominal distention, abdominal pain, constipation and diarrhea.  Genitourinary:  Negative for difficulty urinating and dysuria.  Musculoskeletal:  Positive for arthralgias and gait problem. Negative for back pain, joint swelling and myalgias.  Neurological:  Negative for dizziness, tremors, seizures, syncope, facial asymmetry, speech difficulty, weakness, light-headedness, numbness and headaches.  Psychiatric/Behavioral:  Positive for confusion. Negative for agitation, behavioral problems and sleep disturbance.    Immunization History  Administered Date(s) Administered   Influenza Inj Mdck Quad Pf 01/05/2016   Influenza, High Dose Seasonal PF 01/01/2019   Influenza,inj,Quad PF,6+ Mos 01/07/2018   Influenza-Unspecified 12/22/2014, 01/10/2017, 01/08/2020   Moderna SARS-COV2 Booster Vaccination 01/28/2020   Moderna Sars-Covid-2 Vaccination 03/24/2019, 04/21/2019   Pneumococcal Conjugate-13 12/14/2013   Pneumococcal Polysaccharide-23 03/29/2016   Tdap 03/29/2016   Zoster Recombinat (Shingrix) 04/01/2017, 06/27/2017   Zoster, Live 11/03/2007   Pertinent  Health Maintenance Due  Topic Date Due   PNA vac Low Risk Adult  Completed   DEXA SCAN  Discontinued   Fall Risk  07/02/2019 04/22/2018  Falls in the past year? 0 0  Number falls in past yr: 0 0  Injury with Fall? 0 0  Risk for  fall due to : - Impaired mobility;Medication side effect;Mental status change;Impaired balance/gait  Follow up - Falls evaluation completed;Education provided  Comment - seen by therapy for all of this   Functional Status Survey:    Vitals:   09/22/20 1103  Weight: 131 lb (59.4 kg)   Body mass index is 21.8 kg/m. Physical Exam Vitals and nursing note reviewed.  Constitutional:      General: She is not in acute distress.    Appearance: She is not diaphoretic.  HENT:     Head: Normocephalic and atraumatic.     Mouth/Throat:     Mouth: Mucous membranes are moist.     Pharynx: Oropharynx is clear. No oropharyngeal exudate.  Neck:     Vascular: No JVD.  Cardiovascular:     Rate and Rhythm: Normal rate and regular rhythm.     Heart sounds: No murmur heard. Pulmonary:     Effort: Pulmonary effort is normal. No respiratory distress.     Breath sounds: Normal breath sounds. No wheezing.  Abdominal:     General: Bowel sounds are normal. There is no distension.     Palpations: Abdomen is soft.     Tenderness: There is no abdominal tenderness.  Musculoskeletal:        General: Tenderness (bilateral ankle area with resolving ecchymoses) and signs of injury present.     Right Henry leg: No edema.     Left Henry leg: No edema.  Skin:    General: Skin is warm and dry.  Neurological:     General: No focal deficit present.     Mental Status: She is alert. Mental status is at baseline.  Psychiatric:        Mood and Affect: Mood normal.    Labs reviewed: Recent Labs    08/02/20 0000 09/06/20 0000  NA 142 145  K 3.9 4.4  CL 104 110*  CO2 24* 26*  BUN 30* 41*  CREATININE 0.9 1.2*  CALCIUM 9.2 8.4*   Recent Labs    08/02/20 0000 09/06/20 0000  AST 20 12*  ALT 12 7  ALKPHOS 137* 96  ALBUMIN 4.2 3.5   Recent Labs    08/02/20 0000 09/06/20 0000  WBC 8.0 6.3  NEUTROABS  --  3.90  HGB 12.1 13.2  HCT 38 40  PLT 206 144*   Lab Results  Component Value  Date   TSH  3.84 08/02/2020   No results found for: HGBA1C Lab Results  Component Value Date   CHOL 159 02/09/2017   HDL 49 02/09/2017   LDLCALC 96 02/09/2017   TRIG 69 02/09/2017   CHOLHDL 3.2 02/09/2017    Significant Diagnostic Results in last 30 days:  No results found.  Assessment/Plan  1. Weight loss Due to acute injury and also progressive dementia and dysphagia  2. Late onset Alzheimer's disease without behavioral disturbance (HCC) Severe stage Referral to hospice made, discussed with her daughter n law [name]. [name] would like [name] daughter [name] to be the first contact for hospice  3. Oropharyngeal dysphagia Continue Puree diet with asp prec  4. Closed fracture of both ankles, initial encounter Non weight bearing Supportive boots in place Continue tylenol and ultram  5. Chronic systolic heart failure (HCC) Due to poor intake and weight loss will discontinue lasix Continue to monitor weight and for sob    Family/ staff Communication: discussed with [name]   Labs/tests ordered:  NA

## 2020-10-24 ENCOUNTER — Encounter: Payer: Self-pay | Admitting: Internal Medicine

## 2020-10-24 ENCOUNTER — Non-Acute Institutional Stay (SKILLED_NURSING_FACILITY): Payer: Medicare Other | Admitting: Internal Medicine

## 2020-10-24 DIAGNOSIS — R634 Abnormal weight loss: Secondary | ICD-10-CM

## 2020-10-24 DIAGNOSIS — N1832 Chronic kidney disease, stage 3b: Secondary | ICD-10-CM

## 2020-10-24 DIAGNOSIS — S82892S Other fracture of left lower leg, sequela: Secondary | ICD-10-CM

## 2020-10-24 DIAGNOSIS — S82891S Other fracture of right lower leg, sequela: Secondary | ICD-10-CM

## 2020-10-24 DIAGNOSIS — M1A09X0 Idiopathic chronic gout, multiple sites, without tophus (tophi): Secondary | ICD-10-CM

## 2020-10-24 DIAGNOSIS — G301 Alzheimer's disease with late onset: Secondary | ICD-10-CM

## 2020-10-24 DIAGNOSIS — I5022 Chronic systolic (congestive) heart failure: Secondary | ICD-10-CM

## 2020-10-24 DIAGNOSIS — F0280 Dementia in other diseases classified elsewhere without behavioral disturbance: Secondary | ICD-10-CM

## 2020-10-24 NOTE — Progress Notes (Signed)
Location:   [facility] Room Number: 111 Place of Service:  SNF [phone]) Provider:  Einar Crow MD  Mahlon Gammon, MD  Patient Care Team: Mahlon Gammon, MD as PCP - General (Internal Medicine) [name], MD as PCP - Cardiology (Cardiology) [name], NP as Nurse Practitioner (Nurse Practitioner)  Extended Emergency Contact Information Primary Emergency Contact: [name] Address: [address]          [place], [place] [address] [name] Home Phone: [phone] Relation: Other Secondary Emergency Contact: [name] [place] Home Phone: [phone] Relation: Daughter  Code Status:  DNR HOSPICE Goals of care: Advanced Directive information Advanced Directives 10/24/2020  Does Patient Have a Medical Advance Directive? Yes  Type of Advance Directive Out of facility DNR (pink MOST or yellow form);Living will  Does patient want to make changes to medical advance directive? No - Patient declined  Copy of Healthcare Power of Attorney in Chart? -  Pre-existing out of facility DNR order (yellow form or pink MOST form) Pink MOST form placed in chart (order not valid for inpatient use);Yellow form placed in chart (order not valid for inpatient use)     Chief Complaint  Patient presents with   Medical Management of Chronic Issues   Health Maintenance    #4 covid vaccine    HPI:  Pt is a 87 y.o. female seen today for medical management of chronic diseases.    Has h/o dementia in dependent for her ADLs  stage IIIa CKD  CAD Chronic systolic CHF, s/p BIV ad PPM H/o Gout and Pseudogout with Arthritis in Multiple Joints  weight loss  Also recent bilateral Ankle Fracture after a fall. Managed Conservatively  Doing well Use boots only when up in Wheelchair Pain Controlled by Tylenol Has lost few more pounds since my last visit Seems Comfortable No New issues Enrolled in  Hospice Past Medical History:  Diagnosis Date   CAD (coronary artery disease)    Chronic low back pain    Contusion of foot, right 05/15/12   Dementia (HCC) 10/16/2016   06/25/17 MMSE 18/30 passed clock   Diverticulitis    Dyslipidemia    Dysphagia 12/11/2016   H/O: hysterectomy    HTN (hypertension)    Memory disturbance 05/2012   MMSE 26/30, intact Clock test   Nonischemic cardiomyopathy (HCC)    Osteoporosis    Pulmonary embolism (HCC)    Right knee pain 05/15/12   Spinal stenosis    Ulcer disease    Weight loss    Past Surgical History:  Procedure Laterality Date   APPENDECTOMY  2010   BACK SURGERY     BIV ICD GENERTAOR CHANGE OUT N/A 05/24/2011   Procedure: BIV ICD GENERTAOR CHANGE OUT;  Surgeon: [name], MD;  Location: [facility] CATH LAB;  Service: Cardiovascular;  Laterality: N/A;   CATARACT EXTRACTION     COLON RESECTION  2005   CORONARY ANGIOPLASTY WITH STENT PLACEMENT  01/2003   HEMICOLECTOMY     HIP ARTHROPLASTY Left 04/05/2014   Procedure: ARTHROPLASTY BIPOLAR HIP;  Surgeon: Shelda Pal, MD;  Location: [facility] ORS;  Service: Orthopedics;  Laterality: Left;   left shoulder replacement  1991   Dr. [name]   ORIF HIP FRACTURE Left 09/30/2016   Procedure: OPEN REDUCTION INTERNAL FIXATION PERIPROSTHETIC HIP FRACTURE;  Surgeon: Ollen Gross, MD;  Location: [facility] ORS;  Service: Orthopedics;  Laterality: Left;   right shoulder repair  83   Dr. [name]   rotator cuff debridement  1999   Dr. [name]    Allergies  Allergen Reactions   Arthrotec [Diclofenac-Misoprostol] Nausea Only   Ibuprofen Nausea Only   Lactose Intolerance (Gi)    Milk-Related Compounds Nausea And Vomiting    Cheese   Other     Grass and ragweed    Oxycontin [Oxycodone Hcl] Nausea Only   Pollen Extract    Verelan [Verapamil]     Allergies as of 10/24/2020       Reactions   Arthrotec [diclofenac-misoprostol] Nausea Only   Ibuprofen Nausea Only   Lactose Intolerance (gi)    Milk-related Compounds Nausea  And Vomiting   Cheese   Other    Grass and ragweed    Oxycontin [oxycodone Hcl] Nausea Only   Pollen Extract    Verelan [verapamil]         Medication List        Accurate as of October 24, 2020  1:16 PM. If you have any questions, ask your nurse or doctor.          STOP taking these medications    traMADol 50 MG tablet Commonly known as: ULTRAM Stopped by: Mahlon Gammon, MD       TAKE these medications    acetaminophen 325 MG tablet Commonly known as: TYLENOL Take 650 mg by mouth in the morning, at noon, and at bedtime.   albuterol 108 (90 Base) MCG/ACT inhaler Commonly known as: VENTOLIN HFA Inhale 2 puffs into the lungs every 6 (six) hours as needed for wheezing or shortness of breath.   allopurinol 100 MG tablet Commonly known as: ZYLOPRIM Take 100 mg by mouth daily.   aspirin EC 81 MG tablet Take 81 mg by mouth daily.   carboxymethylcellul-glycerin 0.5-0.9 % ophthalmic solution Commonly known as: REFRESH OPTIVE Apply 2 drops to eye 2 (two) times daily.   carvedilol 3.125 MG tablet Commonly known as: COREG Take 1.5625 mg by mouth 2 (two) times daily with a meal.   chlorhexidine 0.12 % solution Commonly known as: PERIDEX Use as directed 15 mLs in the mouth or throat 2 (two) times daily.   cyanocobalamin 1000 MCG tablet Take 1,000 mcg by mouth daily.   diclofenac Sodium 1 % Gel Commonly known as: VOLTAREN Apply 4 g topically 4 (four) times daily. Both knees   Docusate Sodium 100 MG capsule Take 100 mg by mouth daily. May crush   feeding supplement (BOOST BREEZE) Liqd Take 1 Container by mouth in the morning and at bedtime.   melatonin 5 MG Tabs Take 5 mg by mouth at bedtime.   omeprazole 20 MG tablet Commonly known as: PRILOSEC OTC Take 20 mg by mouth 2 (two) times daily.   polyethylene glycol 17 g packet Commonly known as: MIRALAX / GLYCOLAX Take 17 g by mouth every other day. Hold for loose stools   sertraline 25 MG  tablet Commonly known as: ZOLOFT Take 25 mg by mouth at bedtime.   Zinc Oxide 13 % Crea Apply topically as needed (after each bathroom use).        Review of Systems  Unable to perform ROS: Dementia   Immunization History  Administered Date(s) Administered   Influenza Inj Mdck Quad Pf 01/05/2016   Influenza, High Dose Seasonal PF 01/01/2019   Influenza,inj,Quad PF,6+ Mos 01/07/2018   Influenza-Unspecified 12/22/2014, 01/10/2017, 01/08/2020   Moderna SARS-COV2 Booster Vaccination 01/28/2020   Moderna Sars-Covid-2 Vaccination 03/24/2019, 04/21/2019   Pneumococcal Conjugate-13  12/14/2013   Pneumococcal Polysaccharide-23 03/29/2016   Tdap 03/29/2016   Zoster Recombinat (Shingrix) 04/01/2017, 06/27/2017   Zoster, Live 11/03/2007   Pertinent  Health Maintenance Due  Topic Date Due   PNA vac Low Risk Adult  Completed   DEXA SCAN  Discontinued   Fall Risk  07/02/2019 04/22/2018  Falls in the past year? 0 0  Number falls in past yr: 0 0  Injury with Fall? 0 0  Risk for fall due to : - Impaired mobility;Medication side effect;Mental status change;Impaired balance/gait  Follow up - Falls evaluation completed;Education provided  Comment - seen by therapy for all of this   Functional Status Survey:    Vitals:   10/24/20 1143  BP: 92/60  Pulse: 70  Resp: 18  Temp: (!) 97 F (36.1 C)  SpO2: 94%  Weight: 139 lb 9.6 oz (63.3 kg)  Height: 5\' 5"  (1.651 m)   Body mass index is 23.23 kg/m. Physical Exam Vitals reviewed.  Constitutional:      Appearance: Normal appearance.  HENT:     Head: Normocephalic.     Nose: Nose normal.     Mouth/Throat:     Mouth: Mucous membranes are moist.     Pharynx: Oropharynx is clear.  Eyes:     Pupils: Pupils are equal, round, and reactive to light.  Cardiovascular:     Rate and Rhythm: Normal rate and regular rhythm.     Pulses: Normal pulses.     Heart sounds: Normal heart sounds.  Pulmonary:     Effort: Pulmonary effort is normal.   Abdominal:     General: Abdomen is flat. Bowel sounds are normal.     Palpations: Abdomen is soft.  Musculoskeletal:        General: No swelling.     Cervical back: Neck supple.  Skin:    General: Skin is warm.  Neurological:     General: No focal deficit present.     Mental Status: She is alert.     Comments: Pleasantly Confused  Psychiatric:        Mood and Affect: Mood normal.        Thought Content: Thought content normal.    Labs reviewed: Recent Labs    08/02/20 0000 09/06/20 0000  NA 142 145  K 3.9 4.4  CL 104 110*  CO2 24* 26*  BUN 30* 41*  CREATININE 0.9 1.2*  CALCIUM 9.2 8.4*   Recent Labs    08/02/20 0000 09/06/20 0000  AST 20 12*  ALT 12 7  ALKPHOS 137* 96  ALBUMIN 4.2 3.5   Recent Labs    08/02/20 0000 09/06/20 0000  WBC 8.0 6.3  NEUTROABS  --  3.90  HGB 12.1 13.2  HCT 38 40  PLT 206 144*   Lab Results  Component Value Date   TSH 3.84 08/02/2020   No results found for: HGBA1C Lab Results  Component Value Date   CHOL 159 02/09/2017   HDL 49 02/09/2017   LDLCALC 96 02/09/2017   TRIG 69 02/09/2017   CHOLHDL 3.2 02/09/2017    Significant Diagnostic Results in last 30 days:  No results found.  Assessment/Plan Closed fracture of both ankles, sequela Pain Control with tylenol NWB Boots when in her Wheelchair Late onset Alzheimer's disease without behavioral disturbance (HCC) Enrolled in Hospice Staying stable Weight loss Due to her Dementia Continue Supplements  Chronic systolic heart failure (HCC) Discontinue Coreg as BP running low Stage 3b chronic kidney disease (HCC)  Some worsening in her Renal Function Encourage PO fluids Idiopathic chronic gout of multiple sites without tophus On Allopurinol Biventricular cardiac pacemaker in situ Depression On Zoloft GERD Continue Prilosec Family/ staff Communication:   Labs/tests ordered:

## 2020-11-03 ENCOUNTER — Non-Acute Institutional Stay (SKILLED_NURSING_FACILITY): Payer: Medicare Other | Admitting: Adult Health

## 2020-11-03 ENCOUNTER — Encounter: Payer: Self-pay | Admitting: Adult Health

## 2020-11-03 DIAGNOSIS — G301 Alzheimer's disease with late onset: Secondary | ICD-10-CM

## 2020-11-03 DIAGNOSIS — E861 Hypovolemia: Secondary | ICD-10-CM

## 2020-11-03 DIAGNOSIS — I9589 Other hypotension: Secondary | ICD-10-CM | POA: Diagnosis not present

## 2020-11-03 DIAGNOSIS — F0280 Dementia in other diseases classified elsewhere without behavioral disturbance: Secondary | ICD-10-CM

## 2020-11-03 DIAGNOSIS — R0602 Shortness of breath: Secondary | ICD-10-CM

## 2020-11-03 NOTE — Progress Notes (Signed)
Location:  [facility] Room Number: 111-A Place of Service:  SNF [id number]) Provider:  [name], NP   Patient Care Team: Mahlon Gammon, MD as PCP - General (Internal Medicine) [name], MD as PCP - Cardiology (Cardiology) [name], NP as Nurse Practitioner (Nurse Practitioner)  Extended Emergency Contact Information Primary Emergency Contact: [name] Address: [address]          [place], [place] [address] [name] Home Phone: [phone] Relation: Other Secondary Emergency Contact: [name] [place] Home Phone: [phone] Relation: Daughter  Code Status:  DNR  Goals of care: Advanced Directive information Advanced Directives 11/03/2020  Does Patient Have a Medical Advance Directive? Yes  Type of Estate agent of [place];Living will;Out of facility DNR (pink MOST or yellow form)  Does patient want to make changes to medical advance directive? -  Copy of Healthcare Power of Attorney in Chart? Yes - validated most recent copy scanned in chart (See row information)  Pre-existing out of facility DNR order (yellow form or pink MOST form) Yellow form placed in chart (order not valid for inpatient use);Pink MOST form placed in chart (order not valid for inpatient use)     Chief Complaint  Patient presents with   Acute Visit    Cough and low blood pressure     HPI:  Pt is a 87 y.o. female seen today for an acute visit for sob and low bp.  [facility] at [facility] provided a note from the night nurse stating that [name] had a cough and throat clearing. She was wheezing and was given 1 dose of albuterol. Symptoms after this subsided. Later in the night the nurse came back to check on [name] and she was having some difficulty breathing and sats were in the 80s.  02 at 2 liters was provided and now sats are 95%.  The difficulty breathing subsided. At  that time the lungs were clear. The nurse on duty today reports that [name] has a chronic cough and but no coughing is noted this morning. She has not had a fever and is not short of breath. She did not eat breakfast and seems to be sleeping more. She is followed by hospice for dementia and weight loss. [name] had a DNR and most form which indicates no hospitalizations. BP this morning is 68/48 HR 108.  She denies feeling dizzy and denies having any pain or sob. She is resting comfortably in her bed. Hospice is coming to see her today.  Prior to this she had an injury with bilateral ankle fractures but is not having any pain. She is not ambulatory.    Past Medical History:  Diagnosis Date   CAD (coronary artery disease)    Chronic low back pain    Contusion of foot, right 05/15/12   Dementia (HCC) 10/16/2016   06/25/17 MMSE 18/30 passed clock   Diverticulitis    Dyslipidemia    Dysphagia 12/11/2016   H/O: hysterectomy    HTN (hypertension)    Memory disturbance 05/2012   MMSE 26/30, intact Clock test   Nonischemic cardiomyopathy (HCC)    Osteoporosis    Pulmonary embolism (HCC)    Right knee pain 05/15/12   Spinal stenosis    Ulcer disease    Weight loss    Past Surgical History:  Procedure Laterality Date   APPENDECTOMY  2010   BACK SURGERY  BIV ICD GENERTAOR CHANGE OUT N/A 05/24/2011   Procedure: BIV ICD GENERTAOR CHANGE OUT;  Surgeon: [name], MD;  Location: [facility] CATH LAB;  Service: Cardiovascular;  Laterality: N/A;   CATARACT EXTRACTION     COLON RESECTION  2005   CORONARY ANGIOPLASTY WITH STENT PLACEMENT  01/2003   HEMICOLECTOMY     HIP ARTHROPLASTY Left 04/05/2014   Procedure: ARTHROPLASTY BIPOLAR HIP;  Surgeon: Shelda Pal, MD;  Location: [facility] ORS;  Service: Orthopedics;  Laterality: Left;   left shoulder replacement  1991   Dr. [name]   ORIF HIP FRACTURE Left 09/30/2016   Procedure: OPEN REDUCTION INTERNAL FIXATION PERIPROSTHETIC HIP FRACTURE;  Surgeon: Ollen Gross,  MD;  Location: [facility] OR[facility];  Service: Orthopedics;  Laterality: Left;   right shoulder repair  1985   Dr. [name]   rotator cuff debridement  1999   Dr. [name]    Allergies  Allergen Reactions   Arthrotec [Diclofenac-Misoprostol] Nausea Only   Ibuprofen Nausea Only   Lactose Intolerance (Gi)    Milk-Related Compounds Nausea And Vomiting    Cheese   Other     Grass and ragweed    Oxycontin [Oxycodone Hcl] Nausea Only   Pollen Extract    Verelan [Verapamil]     Outpatient Encounter Medications as of 11/03/2020  Medication Sig   acetaminophen (TYLENOL) 325 MG tablet Take 650 mg by mouth in the morning, at noon, and at bedtime.   albuterol (PROVENTIL HFA;VENTOLIN HFA) 108 (90 Base) MCG/ACT inhaler Inhale 2 puffs into the lungs every 6 (six) hours as needed for wheezing or shortness of breath.   allopurinol (ZYLOPRIM) 100 MG tablet Take 100 mg by mouth daily.   aspirin EC 81 MG tablet Take 81 mg by mouth daily.   carboxymethylcellul-glycerin (REFRESH OPTIVE) 0.5-0.9 % ophthalmic solution Apply 2 drops to eye 2 (two) times daily.   chlorhexidine (PERIDEX) 0.12 % solution Use as directed 15 mLs in the mouth or throat 2 (two) times daily.   cyanocobalamin 1000 MCG tablet Take 1,000 mcg by mouth daily.    Dextromethorphan-guaiFENesin (ROBAFEN DM) 10-100 MG/5ML liquid Take 15 mLs by mouth every 6 (six) hours as needed.   diclofenac Sodium (VOLTAREN) 1 % GEL Apply 4 g topically 4 (four) times daily. Both knees   Docusate Sodium 100 MG capsule Take 100 mg by mouth daily. May crush   melatonin 5 MG TABS Take 5 mg by mouth at bedtime.   Nutritional Supplements (FEEDING SUPPLEMENT, BOOST BREEZE,) LIQD Take 1 Container by mouth in the morning and at bedtime.    omeprazole (PRILOSEC OTC) 20 MG tablet Take 20 mg by mouth 2 (two) times daily.   polyethylene glycol (MIRALAX / GLYCOLAX) packet Take 17 g by mouth every other day. Hold for loose stools   sertraline (ZOLOFT) 25 MG tablet Take 25 mg by mouth at  bedtime.   Zinc Oxide 13 % CREA Apply topically as needed (after each bathroom use).   No facility-administered encounter medications on file as of 11/03/2020.    Review of Systems  Unable to perform ROS: Dementia   Immunization History  Administered Date(s) Administered   Influenza Inj Mdck Quad Pf 01/05/2016   Influenza, High Dose Seasonal PF 01/01/2019   Influenza,inj,Quad PF,6+ Mos 01/07/2018   Influenza-Unspecified 12/22/2014, 01/10/2017, 01/08/2020   Moderna SARS-COV2 Booster Vaccination 01/28/2020   Moderna Sars-Covid-2 Vaccination 03/24/2019, 04/21/2019   Pneumococcal Conjugate-13 12/14/2013   Pneumococcal Polysaccharide-23 03/29/2016   Tdap 03/29/2016   Zoster Recombinat (Shingrix) 04/01/2017,  06/27/2017   Zoster, Live 11/03/2007   Pertinent  Health Maintenance Due  Topic Date Due   PNA vac Low Risk Adult  Completed   DEXA SCAN  Discontinued   Fall Risk  07/02/2019 04/22/2018  Falls in the past year? 0 0  Number falls in past yr: 0 0  Injury with Fall? 0 0  Risk for fall due to : - Impaired mobility;Medication side effect;Mental status change;Impaired balance/gait  Follow up - Falls evaluation completed;Education provided  Comment - seen by therapy for all of this   Functional Status Survey:    Vitals:   11/03/20 1010  BP: 115/78  Pulse: 71  Resp: 18  Temp: (!) 97.3 F (36.3 C)  SpO2: 94%  Weight: 139 lb 9.6 oz (63.3 kg)  Height: 5\' 5"  (1.651 m)   Body mass index is 23.23 kg/m. Wt Readings from Last 3 Encounters:  11/03/20 139 lb 9.6 oz (63.3 kg)  10/24/20 139 lb 9.6 oz (63.3 kg)  09/22/20 131 lb (59.4 kg)    Physical Exam Vitals and nursing note reviewed.  Constitutional:      General: She is not in acute distress.    Appearance: She is not diaphoretic.  HENT:     Head: Normocephalic and atraumatic.     Nose: Nose normal.     Mouth/Throat:     Mouth: Mucous membranes are moist.     Pharynx: Oropharynx is clear.  Eyes:     Conjunctiva/sclera:  Conjunctivae normal.     Pupils: Pupils are equal, round, and reactive to light.  Neck:     Vascular: No JVD.  Cardiovascular:     Rate and Rhythm: Normal rate and regular rhythm.     Heart sounds: No murmur heard. Pulmonary:     Effort: Pulmonary effort is normal. No respiratory distress.     Breath sounds: Normal breath sounds. No wheezing.  Abdominal:     General: Bowel sounds are normal. There is no distension.     Palpations: Abdomen is soft.     Tenderness: There is no abdominal tenderness.  Musculoskeletal:     Right lower leg: No edema.     Left lower leg: No edema.  Skin:    General: Skin is warm and dry.     Coloration: Skin is pale.  Neurological:     General: No focal deficit present.     Mental Status: She is alert. Mental status is at baseline.  Psychiatric:        Mood and Affect: Mood normal.    Labs reviewed: Recent Labs    08/02/20 0000 09/06/20 0000  NA 142 145  K 3.9 4.4  CL 104 110*  CO2 24* 26*  BUN 30* 41*  CREATININE 0.9 1.2*  CALCIUM 9.2 8.4*   Recent Labs    08/02/20 0000 09/06/20 0000  AST 20 12*  ALT 12 7  ALKPHOS 137* 96  ALBUMIN 4.2 3.5   Recent Labs    08/02/20 0000 09/06/20 0000  WBC 8.0 6.3  NEUTROABS  --  3.90  HGB 12.1 13.2  HCT 38 40  PLT 206 144*   Lab Results  Component Value Date   TSH 3.84 08/02/2020   No results found for: HGBA1C Lab Results  Component Value Date   CHOL 159 02/09/2017   HDL 49 02/09/2017   LDLCALC 96 02/09/2017   TRIG 69 02/09/2017   CHOLHDL 3.2 02/09/2017    Significant Diagnostic Results in last 30 days:  No results found.  Assessment/Plan  1. Shortness of breath Resolved with oxygen Continue oxygen 2 liters as needed for hypoxia and sob Does not appear to be in fluid overload with no edema and clear lungs Followed by hospice Change albuterol MDI to duoneb q 6 prn as she is not able to use the MDI's  2. Hypotension due to hypovolemia Bp increased to 84/44.  Was running in  the 90-110 range Likely due to hypovolemia Due to her goals of care/CHF would not provide IVF Recommend oral fluid if able and staying in bed.   3. Late onset Alzheimer's disease without behavioral disturbance (HCC) Severe stage, followed by hospice.  Goals of care are comfort based   Hospice to see pt this evening. Appears comfortable at this time.   Labs/tests ordered:  NA

## 2020-12-17 DEATH — deceased
